# Patient Record
Sex: Female | Born: 1960 | Race: White | Hispanic: No | Marital: Single | Smoking: Current every day smoker
Health system: Southern US, Community
[De-identification: ages and names within clinical notes are randomized; demographics above are authoritative.]

## PROBLEM LIST (undated history)

## (undated) DIAGNOSIS — Z87828 Personal history of other (healed) physical injury and trauma: Secondary | ICD-10-CM

## (undated) DIAGNOSIS — M1990 Unspecified osteoarthritis, unspecified site: Secondary | ICD-10-CM

## (undated) DIAGNOSIS — Z8669 Personal history of other diseases of the nervous system and sense organs: Secondary | ICD-10-CM

## (undated) DIAGNOSIS — I10 Essential (primary) hypertension: Secondary | ICD-10-CM

## (undated) DIAGNOSIS — M549 Dorsalgia, unspecified: Secondary | ICD-10-CM

## (undated) DIAGNOSIS — G8929 Other chronic pain: Secondary | ICD-10-CM

## (undated) DIAGNOSIS — Z87891 Personal history of nicotine dependence: Secondary | ICD-10-CM

## (undated) DIAGNOSIS — R079 Chest pain, unspecified: Secondary | ICD-10-CM

## (undated) DIAGNOSIS — J449 Chronic obstructive pulmonary disease, unspecified: Secondary | ICD-10-CM

## (undated) DIAGNOSIS — R0600 Dyspnea, unspecified: Secondary | ICD-10-CM

## (undated) DIAGNOSIS — K219 Gastro-esophageal reflux disease without esophagitis: Secondary | ICD-10-CM

## (undated) DIAGNOSIS — M810 Age-related osteoporosis without current pathological fracture: Secondary | ICD-10-CM

## (undated) DIAGNOSIS — I493 Ventricular premature depolarization: Secondary | ICD-10-CM

## (undated) DIAGNOSIS — E785 Hyperlipidemia, unspecified: Secondary | ICD-10-CM

## (undated) DIAGNOSIS — F444 Conversion disorder with motor symptom or deficit: Secondary | ICD-10-CM

## (undated) DIAGNOSIS — K921 Melena: Secondary | ICD-10-CM

## (undated) DIAGNOSIS — E559 Vitamin D deficiency, unspecified: Secondary | ICD-10-CM

## (undated) DIAGNOSIS — R630 Anorexia: Secondary | ICD-10-CM

## (undated) DIAGNOSIS — G8918 Other acute postprocedural pain: Secondary | ICD-10-CM

## (undated) HISTORY — DX: Age-related osteoporosis without current pathological fracture: M81.0

## (undated) HISTORY — DX: Gastro-esophageal reflux disease without esophagitis: K21.9

## (undated) HISTORY — DX: Ventricular premature depolarization: I49.3

## (undated) HISTORY — DX: Personal history of other diseases of the nervous system and sense organs: Z86.69

## (undated) HISTORY — DX: Other acute postprocedural pain: G89.18

## (undated) HISTORY — DX: Conversion disorder with motor symptom or deficit: F44.4

## (undated) HISTORY — DX: Chronic obstructive pulmonary disease, unspecified: J44.9

## (undated) HISTORY — DX: Vitamin D deficiency, unspecified: E55.9

## (undated) HISTORY — DX: Essential (primary) hypertension: I10

## (undated) HISTORY — PX: ABDOMINAL HYSTERECTOMY: SHX81

## (undated) HISTORY — DX: Other chronic pain: G89.29

## (undated) HISTORY — DX: Anorexia: R63.0

## (undated) HISTORY — DX: Hyperlipidemia, unspecified: E78.5

## (undated) HISTORY — DX: Personal history of other (healed) physical injury and trauma: Z87.828

## (undated) HISTORY — DX: Dorsalgia, unspecified: M54.9

## (undated) HISTORY — PX: HERNIA REPAIR: SHX51

---

## 1980-05-06 DIAGNOSIS — Z87828 Personal history of other (healed) physical injury and trauma: Secondary | ICD-10-CM

## 1980-05-06 HISTORY — DX: Personal history of other (healed) physical injury and trauma: Z87.828

## 1980-05-06 HISTORY — PX: EXPLORATORY LAPAROTOMY: SUR591

## 1992-05-06 HISTORY — PX: SPINE SURGERY: SHX786

## 1997-08-06 ENCOUNTER — Inpatient Hospital Stay (HOSPITAL_COMMUNITY): Admission: EM | Admit: 1997-08-06 | Discharge: 1997-08-08 | Payer: Self-pay | Admitting: Emergency Medicine

## 1997-08-11 ENCOUNTER — Encounter: Admission: RE | Admit: 1997-08-11 | Discharge: 1997-08-11 | Payer: Self-pay | Admitting: Family Medicine

## 1997-08-16 ENCOUNTER — Encounter: Admission: RE | Admit: 1997-08-16 | Discharge: 1997-08-16 | Payer: Self-pay | Admitting: Family Medicine

## 1997-09-14 ENCOUNTER — Encounter: Admission: RE | Admit: 1997-09-14 | Discharge: 1997-09-14 | Payer: Self-pay | Admitting: Family Medicine

## 1997-11-07 ENCOUNTER — Emergency Department (HOSPITAL_COMMUNITY): Admission: EM | Admit: 1997-11-07 | Discharge: 1997-11-07 | Payer: Self-pay | Admitting: *Deleted

## 1998-02-21 ENCOUNTER — Other Ambulatory Visit: Admission: RE | Admit: 1998-02-21 | Discharge: 1998-02-21 | Payer: Self-pay | Admitting: *Deleted

## 1998-02-21 ENCOUNTER — Encounter: Admission: RE | Admit: 1998-02-21 | Discharge: 1998-02-21 | Payer: Self-pay | Admitting: Sports Medicine

## 1998-03-22 ENCOUNTER — Encounter: Admission: RE | Admit: 1998-03-22 | Discharge: 1998-03-22 | Payer: Self-pay | Admitting: Family Medicine

## 1998-08-01 ENCOUNTER — Encounter: Admission: RE | Admit: 1998-08-01 | Discharge: 1998-08-01 | Payer: Self-pay | Admitting: Family Medicine

## 1999-01-09 ENCOUNTER — Emergency Department (HOSPITAL_COMMUNITY): Admission: EM | Admit: 1999-01-09 | Discharge: 1999-01-09 | Payer: Self-pay | Admitting: Emergency Medicine

## 1999-02-11 ENCOUNTER — Encounter: Payer: Self-pay | Admitting: Family Medicine

## 1999-02-12 ENCOUNTER — Inpatient Hospital Stay (HOSPITAL_COMMUNITY): Admission: EM | Admit: 1999-02-12 | Discharge: 1999-02-13 | Payer: Self-pay | Admitting: *Deleted

## 1999-06-07 ENCOUNTER — Encounter: Payer: Self-pay | Admitting: Neurosurgery

## 1999-06-07 ENCOUNTER — Ambulatory Visit (HOSPITAL_COMMUNITY): Admission: RE | Admit: 1999-06-07 | Discharge: 1999-06-07 | Payer: Self-pay | Admitting: Neurosurgery

## 1999-06-21 ENCOUNTER — Ambulatory Visit (HOSPITAL_COMMUNITY): Admission: RE | Admit: 1999-06-21 | Discharge: 1999-06-21 | Payer: Self-pay | Admitting: Neurosurgery

## 1999-06-21 ENCOUNTER — Encounter: Payer: Self-pay | Admitting: Neurosurgery

## 1999-07-02 ENCOUNTER — Encounter: Admission: RE | Admit: 1999-07-02 | Discharge: 1999-07-02 | Payer: Self-pay | Admitting: Family Medicine

## 1999-07-23 ENCOUNTER — Encounter: Payer: Self-pay | Admitting: Neurosurgery

## 1999-07-23 ENCOUNTER — Ambulatory Visit (HOSPITAL_COMMUNITY): Admission: RE | Admit: 1999-07-23 | Discharge: 1999-07-23 | Payer: Self-pay | Admitting: Neurosurgery

## 1999-07-27 ENCOUNTER — Encounter: Admission: RE | Admit: 1999-07-27 | Discharge: 1999-07-27 | Payer: Self-pay | Admitting: Family Medicine

## 1999-08-28 ENCOUNTER — Encounter: Admission: RE | Admit: 1999-08-28 | Discharge: 1999-08-28 | Payer: Self-pay | Admitting: Sports Medicine

## 1999-08-28 ENCOUNTER — Encounter: Payer: Self-pay | Admitting: Sports Medicine

## 1999-09-04 ENCOUNTER — Encounter (INDEPENDENT_AMBULATORY_CARE_PROVIDER_SITE_OTHER): Payer: Self-pay | Admitting: *Deleted

## 1999-09-04 LAB — CONVERTED CEMR LAB

## 1999-09-05 ENCOUNTER — Encounter: Admission: RE | Admit: 1999-09-05 | Discharge: 1999-09-05 | Payer: Self-pay | Admitting: Family Medicine

## 2000-01-22 ENCOUNTER — Encounter: Admission: RE | Admit: 2000-01-22 | Discharge: 2000-01-22 | Payer: Self-pay | Admitting: Family Medicine

## 2000-02-21 ENCOUNTER — Encounter: Admission: RE | Admit: 2000-02-21 | Discharge: 2000-02-21 | Payer: Self-pay | Admitting: Family Medicine

## 2000-04-19 ENCOUNTER — Encounter: Admission: RE | Admit: 2000-04-19 | Discharge: 2000-04-19 | Payer: Self-pay | Admitting: Orthopedic Surgery

## 2000-04-19 ENCOUNTER — Encounter: Payer: Self-pay | Admitting: Orthopedic Surgery

## 2000-07-03 ENCOUNTER — Emergency Department (HOSPITAL_COMMUNITY): Admission: EM | Admit: 2000-07-03 | Discharge: 2000-07-03 | Payer: Self-pay | Admitting: Emergency Medicine

## 2000-09-01 ENCOUNTER — Encounter: Admission: RE | Admit: 2000-09-01 | Discharge: 2000-09-01 | Payer: Self-pay | Admitting: Family Medicine

## 2000-09-16 ENCOUNTER — Encounter: Admission: RE | Admit: 2000-09-16 | Discharge: 2000-09-16 | Payer: Self-pay | Admitting: Family Medicine

## 2000-09-24 ENCOUNTER — Encounter: Admission: RE | Admit: 2000-09-24 | Discharge: 2000-09-24 | Payer: Self-pay | Admitting: Family Medicine

## 2000-10-01 ENCOUNTER — Encounter: Admission: RE | Admit: 2000-10-01 | Discharge: 2000-10-01 | Payer: Self-pay | Admitting: Family Medicine

## 2000-10-13 ENCOUNTER — Encounter: Admission: RE | Admit: 2000-10-13 | Discharge: 2000-10-13 | Payer: Self-pay | Admitting: Sports Medicine

## 2000-10-29 ENCOUNTER — Encounter: Admission: RE | Admit: 2000-10-29 | Discharge: 2000-10-29 | Payer: Self-pay | Admitting: Family Medicine

## 2000-10-30 ENCOUNTER — Ambulatory Visit (HOSPITAL_COMMUNITY): Admission: RE | Admit: 2000-10-30 | Discharge: 2000-10-30 | Payer: Self-pay | Admitting: *Deleted

## 2000-11-13 ENCOUNTER — Encounter: Admission: RE | Admit: 2000-11-13 | Discharge: 2000-11-13 | Payer: Self-pay | Admitting: Family Medicine

## 2000-11-24 ENCOUNTER — Encounter: Admission: RE | Admit: 2000-11-24 | Discharge: 2000-11-24 | Payer: Self-pay | Admitting: *Deleted

## 2001-04-10 ENCOUNTER — Encounter (INDEPENDENT_AMBULATORY_CARE_PROVIDER_SITE_OTHER): Payer: Self-pay

## 2001-04-10 ENCOUNTER — Ambulatory Visit (HOSPITAL_COMMUNITY): Admission: RE | Admit: 2001-04-10 | Discharge: 2001-04-10 | Payer: Self-pay | Admitting: *Deleted

## 2001-06-18 ENCOUNTER — Encounter: Admission: RE | Admit: 2001-06-18 | Discharge: 2001-06-18 | Payer: Self-pay | Admitting: Family Medicine

## 2001-08-31 ENCOUNTER — Encounter: Admission: RE | Admit: 2001-08-31 | Discharge: 2001-08-31 | Payer: Self-pay | Admitting: Family Medicine

## 2001-09-07 ENCOUNTER — Inpatient Hospital Stay (HOSPITAL_COMMUNITY): Admission: RE | Admit: 2001-09-07 | Discharge: 2001-09-09 | Payer: Self-pay | Admitting: *Deleted

## 2001-09-10 ENCOUNTER — Encounter: Payer: Self-pay | Admitting: Emergency Medicine

## 2001-09-10 ENCOUNTER — Emergency Department (HOSPITAL_COMMUNITY): Admission: EM | Admit: 2001-09-10 | Discharge: 2001-09-11 | Payer: Self-pay | Admitting: Emergency Medicine

## 2002-04-12 ENCOUNTER — Encounter: Admission: RE | Admit: 2002-04-12 | Discharge: 2002-04-12 | Payer: Self-pay | Admitting: Family Medicine

## 2002-08-27 ENCOUNTER — Encounter: Admission: RE | Admit: 2002-08-27 | Discharge: 2002-08-27 | Payer: Self-pay | Admitting: Sports Medicine

## 2002-08-27 ENCOUNTER — Encounter: Payer: Self-pay | Admitting: Sports Medicine

## 2002-10-14 ENCOUNTER — Other Ambulatory Visit: Admission: RE | Admit: 2002-10-14 | Discharge: 2002-10-14 | Payer: Self-pay | Admitting: *Deleted

## 2003-09-14 ENCOUNTER — Encounter: Admission: RE | Admit: 2003-09-14 | Discharge: 2003-09-14 | Payer: Self-pay | Admitting: Orthopedic Surgery

## 2004-01-04 ENCOUNTER — Encounter: Admission: RE | Admit: 2004-01-04 | Discharge: 2004-01-04 | Payer: Self-pay | Admitting: Family Medicine

## 2004-05-23 ENCOUNTER — Ambulatory Visit: Payer: Self-pay | Admitting: Family Medicine

## 2005-03-07 ENCOUNTER — Ambulatory Visit: Payer: Self-pay | Admitting: Family Medicine

## 2005-08-27 ENCOUNTER — Encounter: Admission: RE | Admit: 2005-08-27 | Discharge: 2005-08-27 | Payer: Self-pay | Admitting: Unknown Physician Specialty

## 2006-07-03 DIAGNOSIS — F17200 Nicotine dependence, unspecified, uncomplicated: Secondary | ICD-10-CM | POA: Insufficient documentation

## 2006-07-03 DIAGNOSIS — K219 Gastro-esophageal reflux disease without esophagitis: Secondary | ICD-10-CM | POA: Insufficient documentation

## 2006-07-04 ENCOUNTER — Encounter (INDEPENDENT_AMBULATORY_CARE_PROVIDER_SITE_OTHER): Payer: Self-pay | Admitting: *Deleted

## 2006-10-22 ENCOUNTER — Telehealth: Payer: Self-pay | Admitting: *Deleted

## 2006-11-17 ENCOUNTER — Emergency Department (HOSPITAL_COMMUNITY): Admission: EM | Admit: 2006-11-17 | Discharge: 2006-11-17 | Payer: Self-pay | Admitting: Emergency Medicine

## 2007-02-13 ENCOUNTER — Encounter
Admission: RE | Admit: 2007-02-13 | Discharge: 2007-03-19 | Payer: Self-pay | Admitting: Physical Medicine & Rehabilitation

## 2007-02-16 ENCOUNTER — Ambulatory Visit: Payer: Self-pay | Admitting: Physical Medicine & Rehabilitation

## 2007-05-29 ENCOUNTER — Encounter: Admission: RE | Admit: 2007-05-29 | Discharge: 2007-05-29 | Payer: Self-pay | Admitting: Orthopedic Surgery

## 2007-06-03 ENCOUNTER — Ambulatory Visit: Payer: Self-pay | Admitting: Physical Medicine & Rehabilitation

## 2007-06-03 ENCOUNTER — Encounter
Admission: RE | Admit: 2007-06-03 | Discharge: 2007-09-01 | Payer: Self-pay | Admitting: Physical Medicine & Rehabilitation

## 2007-06-03 ENCOUNTER — Encounter (INDEPENDENT_AMBULATORY_CARE_PROVIDER_SITE_OTHER): Payer: Self-pay | Admitting: Family Medicine

## 2007-07-17 ENCOUNTER — Ambulatory Visit: Payer: Self-pay | Admitting: Physical Medicine & Rehabilitation

## 2007-07-20 ENCOUNTER — Emergency Department (HOSPITAL_COMMUNITY): Admission: EM | Admit: 2007-07-20 | Discharge: 2007-07-20 | Payer: Self-pay | Admitting: Emergency Medicine

## 2007-09-24 ENCOUNTER — Encounter
Admission: RE | Admit: 2007-09-24 | Discharge: 2007-12-23 | Payer: Self-pay | Admitting: Physical Medicine & Rehabilitation

## 2007-09-29 ENCOUNTER — Ambulatory Visit: Payer: Self-pay | Admitting: Physical Medicine & Rehabilitation

## 2007-11-03 ENCOUNTER — Ambulatory Visit: Payer: Self-pay | Admitting: Physical Medicine & Rehabilitation

## 2007-11-04 ENCOUNTER — Encounter: Admission: RE | Admit: 2007-11-04 | Discharge: 2007-11-04 | Payer: Self-pay | Admitting: Orthopedic Surgery

## 2007-12-15 ENCOUNTER — Ambulatory Visit: Payer: Self-pay | Admitting: Physical Medicine & Rehabilitation

## 2008-01-04 ENCOUNTER — Encounter
Admission: RE | Admit: 2008-01-04 | Discharge: 2008-04-03 | Payer: Self-pay | Admitting: Physical Medicine & Rehabilitation

## 2008-01-05 ENCOUNTER — Ambulatory Visit: Payer: Self-pay | Admitting: Physical Medicine & Rehabilitation

## 2008-02-02 ENCOUNTER — Ambulatory Visit: Payer: Self-pay | Admitting: Physical Medicine & Rehabilitation

## 2008-03-15 ENCOUNTER — Ambulatory Visit: Payer: Self-pay | Admitting: Physical Medicine & Rehabilitation

## 2008-05-09 ENCOUNTER — Encounter
Admission: RE | Admit: 2008-05-09 | Discharge: 2008-08-07 | Payer: Self-pay | Admitting: Physical Medicine & Rehabilitation

## 2008-05-12 ENCOUNTER — Emergency Department: Payer: Self-pay | Admitting: Emergency Medicine

## 2008-05-17 ENCOUNTER — Ambulatory Visit: Payer: Self-pay | Admitting: Physical Medicine & Rehabilitation

## 2008-06-07 ENCOUNTER — Ambulatory Visit: Payer: Self-pay | Admitting: Physical Medicine & Rehabilitation

## 2008-07-12 ENCOUNTER — Ambulatory Visit: Payer: Self-pay | Admitting: Family Medicine

## 2008-07-12 ENCOUNTER — Encounter: Admission: RE | Admit: 2008-07-12 | Discharge: 2008-07-12 | Payer: Self-pay | Admitting: Family Medicine

## 2008-07-12 ENCOUNTER — Encounter (INDEPENDENT_AMBULATORY_CARE_PROVIDER_SITE_OTHER): Payer: Self-pay | Admitting: Family Medicine

## 2008-07-12 LAB — CONVERTED CEMR LAB
ALT: 15 units/L (ref 0–35)
CO2: 23 meq/L (ref 19–32)
Chloride: 106 meq/L (ref 96–112)
Cholesterol: 211 mg/dL — ABNORMAL HIGH (ref 0–200)
Folate: 4.5 ng/mL
MCV: 96.4 fL (ref 78.0–100.0)
Platelets: 246 10*3/uL (ref 150–400)
Sodium: 141 meq/L (ref 135–145)
Total Bilirubin: 0.2 mg/dL — ABNORMAL LOW (ref 0.3–1.2)
Total Protein: 6.6 g/dL (ref 6.0–8.3)
VLDL: 32 mg/dL (ref 0–40)
Vitamin B-12: 624 pg/mL (ref 211–911)
WBC: 10 10*3/uL (ref 4.0–10.5)

## 2008-07-14 ENCOUNTER — Encounter (INDEPENDENT_AMBULATORY_CARE_PROVIDER_SITE_OTHER): Payer: Self-pay | Admitting: Family Medicine

## 2008-07-15 ENCOUNTER — Telehealth: Payer: Self-pay | Admitting: *Deleted

## 2008-07-20 ENCOUNTER — Encounter: Admission: RE | Admit: 2008-07-20 | Discharge: 2008-07-20 | Payer: Self-pay | Admitting: Family Medicine

## 2008-07-28 ENCOUNTER — Telehealth: Payer: Self-pay | Admitting: *Deleted

## 2008-08-17 ENCOUNTER — Encounter (INDEPENDENT_AMBULATORY_CARE_PROVIDER_SITE_OTHER): Payer: Self-pay | Admitting: Family Medicine

## 2008-08-23 ENCOUNTER — Encounter
Admission: RE | Admit: 2008-08-23 | Discharge: 2008-11-13 | Payer: Self-pay | Admitting: Physical Medicine & Rehabilitation

## 2008-08-23 ENCOUNTER — Ambulatory Visit: Payer: Self-pay | Admitting: Physical Medicine & Rehabilitation

## 2008-08-30 ENCOUNTER — Encounter (INDEPENDENT_AMBULATORY_CARE_PROVIDER_SITE_OTHER): Payer: Self-pay | Admitting: Family Medicine

## 2008-08-30 ENCOUNTER — Ambulatory Visit: Payer: Self-pay | Admitting: Family Medicine

## 2008-08-31 ENCOUNTER — Encounter (INDEPENDENT_AMBULATORY_CARE_PROVIDER_SITE_OTHER): Payer: Self-pay | Admitting: *Deleted

## 2008-09-20 ENCOUNTER — Telehealth (INDEPENDENT_AMBULATORY_CARE_PROVIDER_SITE_OTHER): Payer: Self-pay | Admitting: *Deleted

## 2008-09-29 ENCOUNTER — Telehealth (INDEPENDENT_AMBULATORY_CARE_PROVIDER_SITE_OTHER): Payer: Self-pay | Admitting: *Deleted

## 2008-09-29 ENCOUNTER — Telehealth (INDEPENDENT_AMBULATORY_CARE_PROVIDER_SITE_OTHER): Payer: Self-pay | Admitting: Family Medicine

## 2008-09-30 ENCOUNTER — Telehealth (INDEPENDENT_AMBULATORY_CARE_PROVIDER_SITE_OTHER): Payer: Self-pay | Admitting: *Deleted

## 2008-10-04 ENCOUNTER — Telehealth (INDEPENDENT_AMBULATORY_CARE_PROVIDER_SITE_OTHER): Payer: Self-pay | Admitting: *Deleted

## 2008-10-07 ENCOUNTER — Ambulatory Visit: Payer: Self-pay | Admitting: Family Medicine

## 2008-10-11 ENCOUNTER — Encounter (INDEPENDENT_AMBULATORY_CARE_PROVIDER_SITE_OTHER): Payer: Self-pay | Admitting: Family Medicine

## 2008-10-20 ENCOUNTER — Ambulatory Visit: Payer: Self-pay | Admitting: Family Medicine

## 2008-10-20 ENCOUNTER — Encounter: Payer: Self-pay | Admitting: Sports Medicine

## 2008-10-21 ENCOUNTER — Telehealth (INDEPENDENT_AMBULATORY_CARE_PROVIDER_SITE_OTHER): Payer: Self-pay | Admitting: Family Medicine

## 2008-10-22 ENCOUNTER — Telehealth (INDEPENDENT_AMBULATORY_CARE_PROVIDER_SITE_OTHER): Payer: Self-pay | Admitting: *Deleted

## 2008-10-24 ENCOUNTER — Telehealth: Payer: Self-pay | Admitting: Sports Medicine

## 2008-10-26 ENCOUNTER — Telehealth: Payer: Self-pay | Admitting: Sports Medicine

## 2008-10-27 ENCOUNTER — Ambulatory Visit: Payer: Self-pay | Admitting: Family Medicine

## 2008-10-31 ENCOUNTER — Telehealth: Payer: Self-pay | Admitting: Sports Medicine

## 2008-11-02 ENCOUNTER — Inpatient Hospital Stay (HOSPITAL_COMMUNITY): Admission: EM | Admit: 2008-11-02 | Discharge: 2008-11-14 | Payer: Self-pay | Admitting: Emergency Medicine

## 2008-11-02 ENCOUNTER — Ambulatory Visit: Payer: Self-pay | Admitting: Cardiology

## 2008-11-02 ENCOUNTER — Encounter: Payer: Self-pay | Admitting: Family Medicine

## 2008-11-02 ENCOUNTER — Ambulatory Visit: Payer: Self-pay | Admitting: Family Medicine

## 2008-11-02 ENCOUNTER — Ambulatory Visit: Payer: Self-pay | Admitting: Pulmonary Disease

## 2008-11-03 ENCOUNTER — Encounter: Payer: Self-pay | Admitting: Sports Medicine

## 2008-11-03 ENCOUNTER — Encounter: Payer: Self-pay | Admitting: Family Medicine

## 2008-11-04 ENCOUNTER — Ambulatory Visit: Payer: Self-pay | Admitting: Infectious Diseases

## 2008-11-10 ENCOUNTER — Encounter (INDEPENDENT_AMBULATORY_CARE_PROVIDER_SITE_OTHER): Payer: Self-pay | Admitting: Internal Medicine

## 2008-11-15 ENCOUNTER — Encounter: Payer: Self-pay | Admitting: Family Medicine

## 2008-11-16 ENCOUNTER — Ambulatory Visit: Payer: Self-pay | Admitting: Family Medicine

## 2008-11-16 ENCOUNTER — Encounter: Payer: Self-pay | Admitting: Sports Medicine

## 2008-11-16 LAB — CONVERTED CEMR LAB
ALT: 48 units/L — ABNORMAL HIGH (ref 0–35)
AST: 30 units/L (ref 0–37)
Albumin: 4.1 g/dL (ref 3.5–5.2)
Alkaline Phosphatase: 66 units/L (ref 39–117)
BUN: 19 mg/dL (ref 6–23)
CO2: 21 meq/L (ref 19–32)
Calcium: 9.1 mg/dL (ref 8.4–10.5)
Chloride: 102 meq/L (ref 96–112)
Creatinine, Ser: 1.33 mg/dL — ABNORMAL HIGH (ref 0.40–1.20)
Glucose, Bld: 73 mg/dL (ref 70–99)
Hemoglobin: 10.6 g/dL
Potassium: 4.6 meq/L (ref 3.5–5.3)
Sodium: 138 meq/L (ref 135–145)
Total Bilirubin: 0.5 mg/dL (ref 0.3–1.2)
Total Protein: 6.5 g/dL (ref 6.0–8.3)

## 2008-11-18 ENCOUNTER — Telehealth: Payer: Self-pay | Admitting: Sports Medicine

## 2008-11-21 ENCOUNTER — Telehealth: Payer: Self-pay | Admitting: Sports Medicine

## 2008-11-23 ENCOUNTER — Telehealth: Payer: Self-pay | Admitting: Sports Medicine

## 2008-11-29 ENCOUNTER — Telehealth: Payer: Self-pay | Admitting: Sports Medicine

## 2008-12-06 ENCOUNTER — Ambulatory Visit: Payer: Self-pay | Admitting: Family Medicine

## 2008-12-06 ENCOUNTER — Telehealth: Payer: Self-pay | Admitting: Sports Medicine

## 2008-12-06 ENCOUNTER — Encounter: Payer: Self-pay | Admitting: Sports Medicine

## 2008-12-06 ENCOUNTER — Ambulatory Visit (HOSPITAL_COMMUNITY): Admission: RE | Admit: 2008-12-06 | Discharge: 2008-12-06 | Payer: Self-pay | Admitting: Family Medicine

## 2008-12-06 ENCOUNTER — Encounter: Admission: RE | Admit: 2008-12-06 | Discharge: 2008-12-06 | Payer: Self-pay | Admitting: Sports Medicine

## 2008-12-06 LAB — CONVERTED CEMR LAB
BUN: 7 mg/dL (ref 6–23)
Basophils Absolute: 0 10*3/uL (ref 0.0–0.1)
Basophils Relative: 0 % (ref 0–1)
CO2: 26 meq/L (ref 19–32)
Calcium: 9.7 mg/dL (ref 8.4–10.5)
Chloride: 105 meq/L (ref 96–112)
Creatinine, Ser: 0.84 mg/dL (ref 0.40–1.20)
Eosinophils Absolute: 0.3 10*3/uL (ref 0.0–0.7)
Eosinophils Relative: 3 % (ref 0–5)
Glucose, Bld: 82 mg/dL (ref 70–99)
HCT: 38.9 % (ref 36.0–46.0)
Hemoglobin: 11.9 g/dL — ABNORMAL LOW (ref 12.0–15.0)
Lymphocytes Relative: 28 % (ref 12–46)
Lymphs Abs: 2.4 K/uL (ref 0.7–4.0)
MCHC: 30.6 g/dL (ref 30.0–36.0)
MCV: 98.2 fL (ref 78.0–100.0)
Monocytes Absolute: 0.8 10*3/uL (ref 0.1–1.0)
Monocytes Relative: 9 % (ref 3–12)
Neutro Abs: 5.2 K/uL (ref 1.7–7.7)
Neutrophils Relative %: 60 % (ref 43–77)
Platelets: 537 10*3/uL — ABNORMAL HIGH (ref 150–400)
Potassium: 5.4 meq/L — ABNORMAL HIGH (ref 3.5–5.3)
Pro B Natriuretic peptide (BNP): 17.3 pg/mL (ref 0.0–100.0)
RBC: 3.96 M/uL (ref 3.87–5.11)
RDW: 15 % (ref 11.5–15.5)
Sodium: 141 meq/L (ref 135–145)
WBC: 8.6 10*3/microliter (ref 4.0–10.5)

## 2008-12-07 ENCOUNTER — Encounter: Payer: Self-pay | Admitting: Sports Medicine

## 2008-12-07 ENCOUNTER — Ambulatory Visit: Payer: Self-pay | Admitting: Family Medicine

## 2008-12-07 ENCOUNTER — Telehealth: Payer: Self-pay | Admitting: *Deleted

## 2008-12-07 LAB — CONVERTED CEMR LAB
BUN: 8 mg/dL (ref 6–23)
CO2: 24 meq/L (ref 19–32)
Calcium: 9.5 mg/dL (ref 8.4–10.5)
Chloride: 104 meq/L (ref 96–112)
Creatinine, Ser: 0.91 mg/dL (ref 0.40–1.20)
Folate: 6.8 ng/mL
Glucose, Bld: 77 mg/dL (ref 70–99)
Potassium: 4.7 meq/L (ref 3.5–5.3)
Sodium: 141 meq/L (ref 135–145)
TSH: 0.972 microintl units/mL (ref 0.350–4.500)
Vitamin B-12: 573 pg/mL (ref 211–911)

## 2008-12-08 ENCOUNTER — Telehealth: Payer: Self-pay | Admitting: Sports Medicine

## 2008-12-08 ENCOUNTER — Encounter: Payer: Self-pay | Admitting: Sports Medicine

## 2008-12-09 ENCOUNTER — Encounter: Payer: Self-pay | Admitting: Sports Medicine

## 2008-12-22 ENCOUNTER — Telehealth: Payer: Self-pay | Admitting: Sports Medicine

## 2008-12-29 ENCOUNTER — Ambulatory Visit: Payer: Self-pay | Admitting: Family Medicine

## 2008-12-30 ENCOUNTER — Ambulatory Visit: Payer: Self-pay | Admitting: Family Medicine

## 2009-01-16 ENCOUNTER — Telehealth: Payer: Self-pay | Admitting: Sports Medicine

## 2009-01-17 ENCOUNTER — Telehealth: Payer: Self-pay | Admitting: *Deleted

## 2009-01-31 ENCOUNTER — Telehealth: Payer: Self-pay | Admitting: Sports Medicine

## 2009-01-31 ENCOUNTER — Ambulatory Visit: Payer: Self-pay | Admitting: Family Medicine

## 2009-03-01 ENCOUNTER — Telehealth: Payer: Self-pay | Admitting: Sports Medicine

## 2009-03-01 ENCOUNTER — Encounter: Admission: RE | Admit: 2009-03-01 | Discharge: 2009-03-01 | Payer: Self-pay | Admitting: Sports Medicine

## 2009-03-01 ENCOUNTER — Ambulatory Visit: Payer: Self-pay | Admitting: Family Medicine

## 2009-03-01 ENCOUNTER — Encounter: Payer: Self-pay | Admitting: Sports Medicine

## 2009-03-02 ENCOUNTER — Encounter: Payer: Self-pay | Admitting: Sports Medicine

## 2009-03-09 ENCOUNTER — Telehealth: Payer: Self-pay | Admitting: Sports Medicine

## 2009-03-14 ENCOUNTER — Telehealth: Payer: Self-pay | Admitting: Sports Medicine

## 2009-03-15 ENCOUNTER — Ambulatory Visit: Payer: Self-pay | Admitting: Family Medicine

## 2009-03-21 ENCOUNTER — Telehealth (INDEPENDENT_AMBULATORY_CARE_PROVIDER_SITE_OTHER): Payer: Self-pay | Admitting: Family Medicine

## 2009-03-22 ENCOUNTER — Telehealth: Payer: Self-pay | Admitting: Sports Medicine

## 2009-03-22 ENCOUNTER — Encounter: Payer: Self-pay | Admitting: Sports Medicine

## 2009-03-28 ENCOUNTER — Encounter: Payer: Self-pay | Admitting: Sports Medicine

## 2009-04-12 ENCOUNTER — Ambulatory Visit: Payer: Self-pay | Admitting: Family Medicine

## 2009-04-17 ENCOUNTER — Telehealth: Payer: Self-pay | Admitting: Sports Medicine

## 2009-04-18 ENCOUNTER — Ambulatory Visit: Payer: Self-pay | Admitting: Family Medicine

## 2009-04-25 ENCOUNTER — Encounter: Payer: Self-pay | Admitting: Sports Medicine

## 2009-04-26 ENCOUNTER — Telehealth: Payer: Self-pay | Admitting: Sports Medicine

## 2009-05-03 ENCOUNTER — Encounter: Payer: Self-pay | Admitting: Sports Medicine

## 2009-05-03 ENCOUNTER — Ambulatory Visit: Payer: Self-pay | Admitting: Family Medicine

## 2009-05-03 DIAGNOSIS — J310 Chronic rhinitis: Secondary | ICD-10-CM

## 2009-05-03 LAB — CONVERTED CEMR LAB
BUN: 13 mg/dL (ref 6–23)
CO2: 19 meq/L (ref 19–32)
Calcium: 9.7 mg/dL (ref 8.4–10.5)
Chloride: 111 meq/L (ref 96–112)
Creatinine, Ser: 1.18 mg/dL (ref 0.40–1.20)
Glucose, Bld: 77 mg/dL (ref 70–99)
Potassium: 3.7 meq/L (ref 3.5–5.3)
Pro B Natriuretic peptide (BNP): 5.6 pg/mL (ref 0.0–100.0)
Sodium: 144 meq/L (ref 135–145)

## 2009-05-09 ENCOUNTER — Ambulatory Visit: Payer: Self-pay | Admitting: Family Medicine

## 2009-05-10 ENCOUNTER — Ambulatory Visit: Payer: Self-pay | Admitting: Family Medicine

## 2009-05-12 ENCOUNTER — Telehealth: Payer: Self-pay | Admitting: Sports Medicine

## 2009-05-15 ENCOUNTER — Telehealth: Payer: Self-pay | Admitting: Sports Medicine

## 2009-05-16 ENCOUNTER — Telehealth: Payer: Self-pay | Admitting: Sports Medicine

## 2009-05-23 ENCOUNTER — Ambulatory Visit: Payer: Self-pay | Admitting: Family Medicine

## 2009-05-23 ENCOUNTER — Encounter: Payer: Self-pay | Admitting: Family Medicine

## 2009-05-23 DIAGNOSIS — G4700 Insomnia, unspecified: Secondary | ICD-10-CM | POA: Insufficient documentation

## 2009-05-23 DIAGNOSIS — R630 Anorexia: Secondary | ICD-10-CM

## 2009-05-23 LAB — CONVERTED CEMR LAB: H Pylori IgG: NEGATIVE

## 2009-05-24 ENCOUNTER — Telehealth: Payer: Self-pay | Admitting: Family Medicine

## 2009-05-25 ENCOUNTER — Encounter: Payer: Self-pay | Admitting: Family Medicine

## 2009-06-07 ENCOUNTER — Ambulatory Visit: Payer: Self-pay | Admitting: Family Medicine

## 2009-06-07 DIAGNOSIS — I493 Ventricular premature depolarization: Secondary | ICD-10-CM | POA: Insufficient documentation

## 2009-06-15 ENCOUNTER — Telehealth: Payer: Self-pay | Admitting: Sports Medicine

## 2009-06-21 ENCOUNTER — Encounter: Payer: Self-pay | Admitting: Sports Medicine

## 2009-06-21 ENCOUNTER — Ambulatory Visit: Payer: Self-pay | Admitting: Family Medicine

## 2009-06-21 LAB — CONVERTED CEMR LAB
BUN: 8 mg/dL (ref 6–23)
CO2: 26 meq/L (ref 19–32)
Calcium: 9.3 mg/dL (ref 8.4–10.5)
Chloride: 104 meq/L (ref 96–112)
Creatinine, Ser: 0.91 mg/dL (ref 0.40–1.20)
Glucose, Bld: 87 mg/dL (ref 70–99)
H Pylori IgG: NEGATIVE
HCT: 39.3 % (ref 36.0–46.0)
Hemoglobin: 12.8 g/dL (ref 12.0–15.0)
MCHC: 32.6 g/dL (ref 30.0–36.0)
MCV: 94.5 fL (ref 78.0–100.0)
Platelets: 231 K/uL (ref 150–400)
Potassium: 3.6 meq/L (ref 3.5–5.3)
RBC: 4.16 M/uL (ref 3.87–5.11)
RDW: 14 % (ref 11.5–15.5)
Sodium: 141 meq/L (ref 135–145)
WBC: 6.2 10*3/microliter (ref 4.0–10.5)

## 2009-06-30 ENCOUNTER — Telehealth (INDEPENDENT_AMBULATORY_CARE_PROVIDER_SITE_OTHER): Payer: Self-pay | Admitting: *Deleted

## 2009-06-30 ENCOUNTER — Telehealth: Payer: Self-pay | Admitting: *Deleted

## 2009-07-04 ENCOUNTER — Encounter: Admission: RE | Admit: 2009-07-04 | Discharge: 2009-09-25 | Payer: Self-pay | Admitting: Sports Medicine

## 2009-07-07 ENCOUNTER — Telehealth: Payer: Self-pay | Admitting: Sports Medicine

## 2009-07-12 HISTORY — PX: ESOPHAGOGASTRODUODENOSCOPY: SHX1529

## 2009-07-26 ENCOUNTER — Encounter: Payer: Self-pay | Admitting: Sports Medicine

## 2009-07-27 ENCOUNTER — Encounter: Admission: RE | Admit: 2009-07-27 | Discharge: 2009-07-27 | Payer: Self-pay | Admitting: Gastroenterology

## 2009-07-27 ENCOUNTER — Telehealth: Payer: Self-pay | Admitting: Sports Medicine

## 2009-08-09 ENCOUNTER — Ambulatory Visit: Payer: Self-pay | Admitting: Family Medicine

## 2009-08-28 ENCOUNTER — Telehealth: Payer: Self-pay | Admitting: *Deleted

## 2009-09-25 ENCOUNTER — Encounter: Payer: Self-pay | Admitting: Sports Medicine

## 2009-09-27 ENCOUNTER — Telehealth: Payer: Self-pay | Admitting: Sports Medicine

## 2009-09-28 ENCOUNTER — Telehealth: Payer: Self-pay | Admitting: Sports Medicine

## 2009-09-29 ENCOUNTER — Telehealth: Payer: Self-pay | Admitting: Sports Medicine

## 2009-09-29 ENCOUNTER — Ambulatory Visit (HOSPITAL_COMMUNITY): Admission: RE | Admit: 2009-09-29 | Discharge: 2009-09-29 | Payer: Self-pay | Admitting: Family Medicine

## 2009-09-29 ENCOUNTER — Ambulatory Visit: Payer: Self-pay | Admitting: Family Medicine

## 2009-10-04 ENCOUNTER — Encounter (INDEPENDENT_AMBULATORY_CARE_PROVIDER_SITE_OTHER): Payer: Self-pay | Admitting: *Deleted

## 2009-10-09 ENCOUNTER — Encounter: Payer: Self-pay | Admitting: Sports Medicine

## 2009-10-26 ENCOUNTER — Telehealth (INDEPENDENT_AMBULATORY_CARE_PROVIDER_SITE_OTHER): Payer: Self-pay | Admitting: *Deleted

## 2009-10-27 ENCOUNTER — Encounter: Payer: Self-pay | Admitting: Sports Medicine

## 2009-10-27 ENCOUNTER — Ambulatory Visit: Payer: Self-pay | Admitting: Family Medicine

## 2009-10-27 ENCOUNTER — Ambulatory Visit (HOSPITAL_COMMUNITY): Admission: RE | Admit: 2009-10-27 | Discharge: 2009-10-27 | Payer: Self-pay | Admitting: Family Medicine

## 2009-10-30 ENCOUNTER — Telehealth: Payer: Self-pay | Admitting: Sports Medicine

## 2010-01-23 ENCOUNTER — Telehealth: Payer: Self-pay | Admitting: Sports Medicine

## 2010-02-06 ENCOUNTER — Encounter: Payer: Self-pay | Admitting: Sports Medicine

## 2010-02-06 ENCOUNTER — Ambulatory Visit: Payer: Self-pay | Admitting: Family Medicine

## 2010-02-14 ENCOUNTER — Telehealth: Payer: Self-pay | Admitting: Sports Medicine

## 2010-02-16 ENCOUNTER — Encounter: Payer: Self-pay | Admitting: Sports Medicine

## 2010-02-16 ENCOUNTER — Ambulatory Visit: Payer: Self-pay | Admitting: Family Medicine

## 2010-02-16 LAB — CONVERTED CEMR LAB
ALT: 30 units/L (ref 0–35)
AST: 20 units/L (ref 0–37)
Albumin: 4.7 g/dL (ref 3.5–5.2)
Alkaline Phosphatase: 84 units/L (ref 39–117)
BUN: 11 mg/dL (ref 6–23)
CO2: 23 meq/L (ref 19–32)
Calcium: 9.6 mg/dL (ref 8.4–10.5)
Chloride: 107 meq/L (ref 96–112)
Creatinine, Ser: 0.93 mg/dL (ref 0.40–1.20)
Glucose, Bld: 78 mg/dL (ref 70–99)
Potassium: 3.9 meq/L (ref 3.5–5.3)
Prealbumin: 21.4 mg/dL (ref 18.0–45.0)
Sodium: 143 meq/L (ref 135–145)
Total Bilirubin: 0.3 mg/dL (ref 0.3–1.2)
Total Protein: 6.9 g/dL (ref 6.0–8.3)
Vit D, 25-Hydroxy: 33 ng/mL (ref 30–89)

## 2010-02-19 ENCOUNTER — Ambulatory Visit: Payer: Self-pay | Admitting: Family Medicine

## 2010-02-20 ENCOUNTER — Encounter: Admission: RE | Admit: 2010-02-20 | Discharge: 2010-02-20 | Payer: Self-pay | Admitting: Sports Medicine

## 2010-02-20 ENCOUNTER — Telehealth: Payer: Self-pay | Admitting: Sports Medicine

## 2010-02-20 ENCOUNTER — Ambulatory Visit: Payer: Self-pay | Admitting: Family Medicine

## 2010-02-23 ENCOUNTER — Encounter: Payer: Self-pay | Admitting: *Deleted

## 2010-03-08 ENCOUNTER — Ambulatory Visit: Payer: Self-pay | Admitting: Family Medicine

## 2010-03-12 ENCOUNTER — Encounter: Payer: Self-pay | Admitting: Sports Medicine

## 2010-03-13 ENCOUNTER — Ambulatory Visit (HOSPITAL_COMMUNITY): Admission: RE | Admit: 2010-03-13 | Discharge: 2010-03-13 | Payer: Self-pay | Admitting: Sports Medicine

## 2010-03-14 ENCOUNTER — Telehealth: Payer: Self-pay | Admitting: *Deleted

## 2010-03-20 ENCOUNTER — Ambulatory Visit: Payer: Self-pay | Admitting: Family Medicine

## 2010-04-10 ENCOUNTER — Encounter: Payer: Self-pay | Admitting: Sports Medicine

## 2010-04-10 ENCOUNTER — Ambulatory Visit: Payer: Self-pay | Admitting: Family Medicine

## 2010-04-11 ENCOUNTER — Ambulatory Visit (HOSPITAL_COMMUNITY)
Admission: RE | Admit: 2010-04-11 | Discharge: 2010-04-11 | Payer: Self-pay | Source: Home / Self Care | Admitting: Sports Medicine

## 2010-04-11 ENCOUNTER — Telehealth: Payer: Self-pay | Admitting: Sports Medicine

## 2010-04-17 ENCOUNTER — Telehealth (INDEPENDENT_AMBULATORY_CARE_PROVIDER_SITE_OTHER): Payer: Self-pay | Admitting: Family Medicine

## 2010-04-17 ENCOUNTER — Encounter: Payer: Self-pay | Admitting: Sports Medicine

## 2010-05-03 ENCOUNTER — Telehealth: Payer: Self-pay | Admitting: *Deleted

## 2010-05-08 ENCOUNTER — Telehealth: Payer: Self-pay | Admitting: *Deleted

## 2010-06-05 ENCOUNTER — Telehealth: Payer: Self-pay | Admitting: Sports Medicine

## 2010-06-05 NOTE — Miscellaneous (Signed)
 Summary: Consent for Punch Biopsy  Consent for Punch Biopsy   Imported By: Madelin Daring 10/21/2008 11:27:00  _____________________________________________________________________  External Attachment:    Type:   Image     Comment:   External Document
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
## 2010-06-05 NOTE — Assessment & Plan Note (Signed)
Summary: atypical chest pain   Vital Signs:  Patient profile:   50 year old female Weight:      105.9 pounds Temp:     98.2 degrees F oral Pulse rate:   76 / minute Pulse rhythm:   regular BP sitting:   118 / 66  (right arm) Cuff size:   regular  Vitals Entered By: Loralee Pacas CMA (Sep 29, 2009 8:51 AM) CC: chest heaviness Is Patient Diabetic? No   Primary Care Provider:  Rodney Langton MD  CC:  chest heaviness.  History of Present Illness: 50yo F w/ chest discomfort  Chest discomfort: Localized to underneath the left breast x 1 week.  Described as "pressure" lasting 1 minute, nonexertional and nonradiating, self-resolving.  No N/V or diaphoresis or SOB.  No hx of HTN, HLD, or DM.  No family hx of CAD/MI in 1st degree relative.    Habits & Providers  Alcohol-Tobacco-Diet     Tobacco Status: quit  Current Medications (verified): 1)  Lortab 10 10-500 Mg Tabs (Hydrocodone-Acetaminophen) .Marland Kitchen.. 1 Tab By Mouth Three Times A Day 2)  Carisoprodol 350 Mg Tabs (Carisoprodol) .... One Tab Po Three Times A Day As Needed For Back Pain. 3)  Trazodone Hcl 100 Mg Tabs (Trazodone Hcl) .... One Tab By Mouth Qhs As Needed Insomnia 4)  Metoprolol Tartrate 25 Mg Tabs (Metoprolol Tartrate) .... One Half Tab By Mouth Bid 5)  Ambien 10 Mg Tabs (Zolpidem Tartrate) .... One Tab By Mouth Qhs As Needed For Insomnia 6)  Mobic 7.5 Mg Tabs (Meloxicam) .Marland Kitchen.. 1 Tab By Mouth Daily With Food 7)  Nitrostat 0.4 Mg Subl (Nitroglycerin) .... Place Underneath The Tongue Every 5 Minutes As Needed For Chest Pain and Call Your Primary Provider  Allergies (verified): 1)  ! Nsaids 2)  ! Neurontin 3)  ! Ultram  Review of Systems      See HPI  Physical Exam  General:  VS Reviewed. Well appearing, NAD.  Head:  atraumatic.   Neck:  supple, full ROM, no goiter or mass  Lungs:  Normal respiratory effort, chest expands symmetrically. Lungs are clear to auscultation, no crackles or wheezes. Heart:  Normal  rate and regular rhythm. S1 and S2 normal without gallop, murmur, click, rub or other extra sounds. Msk:  chest palpated and unable to reproduce her symptoms Extremities:  no edema Neurologic:  no focal deficits Skin:  nl color and turgor Additional Exam:  12 lead EKG: NSR, rate 70, nl axis, nl intervals, no ST or T wave depression or elevation   Impression & Recommendations:  Problem # 1:  CHEST PAIN, ATYPICAL (ICD-786.59) Assessment New  Low suspicion for cardiac etiology.  No risk factors except for hx of tobacco use but quit 1 year ago.  Would consider her as low-intermediate risk for CAD b/c of tobacco hx.  41yr CHD risk of 1%. Suspect MSK vs. GI vs. psych.  She did have a brief episode in the clinic that lasted less than 2 minutes.  NTG was given and her symptoms improved...hard to say whether it was due to the NTG or self resolving. Mammogram was reviewed- nl (looks like she had focal left chest discomfort at that time as well) Pt discussed with Dr. Mauricio Po and agree on further evaluation with ETT after I run it by Dr. Jennette Kettle. In the meantime, will treat with mobic and provide rx for NTG with instructions on when to go to the ER.  Orders: [facility]- Est  Level 4 ([address])  Complete Medication List: 1)  Lortab 10 10-500 Mg Tabs (Hydrocodone-acetaminophen) .Marland Kitchen.. 1 tab by mouth three times a day 2)  Carisoprodol 350 Mg Tabs (Carisoprodol) .... One tab po three times a day as needed for back pain. 3)  Trazodone Hcl 100 Mg Tabs (Trazodone hcl) .... One tab by mouth qhs as needed insomnia 4)  Metoprolol Tartrate 25 Mg Tabs (Metoprolol tartrate) .... One half tab by mouth bid 5)  Ambien 10 Mg Tabs (Zolpidem tartrate) .... One tab by mouth qhs as needed for insomnia 6)  Mobic 7.5 Mg Tabs (Meloxicam) .Marland Kitchen.. 1 tab by mouth daily with food 7)  Nitrostat 0.4 Mg Subl (Nitroglycerin) .... Place underneath the tongue every 5 minutes as needed for chest pain and call your primary provider  Other Orders: EKG-  [facility] (EKG) NTG 1/150 gr tab [facility])  Patient Instructions: 1)  We will contact you regarding the stress test. 2)  I'm starting you on a anti-inflammatory at this time. 3)  If you contiue to have discomfort that is persistent and lasts for more than 20 minutes, go to the emergency room. Prescriptions: NITROSTAT 0.4 MG SUBL (NITROGLYCERIN) place underneath the tongue every 5 minutes as needed for chest pain and call your primary provider  #2 x 0   Entered and Authorized by:   Marisue Ivan  MD   Signed by:   Marisue Ivan  MD on 09/29/2009   Method used:   Print then Give to Patient   RxID:   [id number] MOBIC 7.5 MG TABS (MELOXICAM) 1 tab by mouth daily with food  #14 x 0   Entered and Authorized by:   Marisue Ivan  MD   Signed by:   Marisue Ivan  MD on 09/29/2009   Method used:   Print then Give to Patient   RxID:   [id number]    Medication Administration  Injection # 1:    Medication: Allergy Injection (1)  Medication # 1:    Medication: NTG 1/150 gr tab    Diagnosis: PREMATURE VENTRICULAR CONTRACTIONS (ICD-427.69)    Dose: 1tablets    Route: SL    Exp Date: 10/04/2009    Lot #: [id number]    Mfr: [id number]    Patient tolerated medication without complications    Given by: Loralee Pacas CMA (Sep 29, 2009 9:58 AM)  Orders Added: 1)  EKG- [facility] [EKG] 2)  NTG 1/150 gr tab [EMRORAL] 3)  [facility]- Est  Level 4 [[address]]

## 2010-06-05 NOTE — Miscellaneous (Signed)
Summary: ETT APPT  APPT FOR ETT IS FRI 6.24.11 AT 11:30 [place].  [facility] WILL PROVIDE PT WITH PROTOCAL SHEET

## 2010-06-05 NOTE — Progress Notes (Signed)
 Summary: test res  Phone Note Call from Patient Call back at Home Phone [phone]   Caller: Patient Summary of Call: checking on EEG results. Initial call taken by: Madelin Daring,  March 09, 2009 10:31 AM  Follow-up for Phone Call        will forward  message to MD. [facility] and they will fax over report now. Follow-up by: Avelina Sharps RN,  March 09, 2009 10:33 AM  Additional Follow-up for Phone Call Additional follow up Details #1::        Technically she should call [facility] for EEG results as they performed the test and are better able to interpret it. Additional Follow-up by: Debby Petties MD,  March 09, 2009 2:57 PM     Appended Document: test res she will call them again. states it was done 10/28 & she is anxious for results. she has called them many times. told her she can ask the engineer, manufacturing what their policy is on returning calls from pts

## 2010-06-05 NOTE — Progress Notes (Signed)
 Summary: meds prob  Phone Note Call from Patient Call back at Home Phone [phone]   Caller: Patient Summary of Call: comivent is making her very hyper and can't sleep and feels wired needs something different [facility] Initial call taken by: Karna Seminole,  January 16, 2009 9:00 AM  Follow-up for Phone Call        told her I will forward this to pcp & call her with his response Follow-up by: Ginnie Mau RN,  January 16, 2009 9:25 AM  Additional Follow-up for Phone Call Additional follow up Details #1::        I will add fluticasone  inhaled.  It is a low strength steroid.  She should use it 2x a day every day, EVEN WHEN SHE IS NOT HAVING SYMPTOMS. Additional Follow-up by: Debby Petties MD,  January 17, 2009 10:43 AM    New/Updated Medications: FLOVENT  HFA 44 MCG/ACT AERO (FLUTICASONE  PROPIONATE  HFA) Two puffs inhaled two times a day, even when not having symptoms. Prescriptions: FLOVENT  HFA 44 MCG/ACT AERO (FLUTICASONE  PROPIONATE  HFA) Two puffs inhaled two times a day, even when not having symptoms.  #1 inhaler x 0   Entered and Authorized by:   Debby Petties MD   Signed by:   Debby Petties MD on 01/17/2009   Method used:   Electronically to        [facility]* (retail)       [address]       [place], [place]  [address]       Ph: [phone]       Fax: [phone]   RxID:   [id number]   Appended Document: meds prob no answer at pt's home

## 2010-06-05 NOTE — Progress Notes (Signed)
Summary: phn msg  Phone Note Call from Patient Call back at [facility] Phone [phone]   Caller: Patient Summary of Call: no matter what she eats hurts the area that she has the hernia - wants to know what she should do. Initial call taken by: De Nurse,  March 14, 2010 3:30 PM  Follow-up for Phone Call        There is no hernia, CT proven.  She is constipated however.  She can get OTC stool softener AND senna and take 3-4x a day. Follow-up by: Rodney Langton MD,  March 14, 2010 11:30 PM  Additional Follow-up for Phone Call Additional follow up Details #1::        Gave pt above message from MD. Patient states she has recently taken laxative and lots of large bm over the past few days and is sure she is no longer constipated but has still continued to have significant pain and is wondering what else can be done. Additional Follow-up by: Loralee Pacas CMA,  March 15, 2010 3:57 PM    Additional Follow-up for Phone Call Additional follow up Details #2::    She can take the pain meds she has as directed or use OTC but she has had multiple CT scans and labwork and  there is no further medical cause for her pain and it is likely self limited.   Follow-up by: Rodney Langton MD,  March 15, 2010 7:54 PM

## 2010-06-05 NOTE — Progress Notes (Signed)
Summary: triage  Phone Note Call from Patient Call back at Home Phone [phone]   Caller: Patient Summary of Call: Wondering if Dr. Benjamin Stain would call her in some Chantax.  Wants to talk to Kennon Rounds about it. Initial call taken by: Clydell Hakim,  Sep 29, 2009 1:52 PM  Follow-up for Phone Call        uses [facility]. asked that she get this done today. ready to quit. forgot to mention it at this am's appt. told her I will call her with md's response Follow-up by: Golden Circle RN,  Sep 29, 2009 2:10 PM  Additional Follow-up for Phone Call Additional follow up Details #1::        COme in to see me to talk about its use and side effects etc. Additional Follow-up by: Rodney Langton MD,  Sep 29, 2009 2:47 PM    Additional Follow-up for Phone Call Additional follow up Details #2::    will see Dr. Claudius Sis next Tuesday. really ready to do this now Follow-up by: Golden Circle RN,  Sep 29, 2009 3:10 PM
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
## 2010-06-05 NOTE — Progress Notes (Signed)
Summary: Rx Req  Phone Note Refill Request Call back at Home Phone [phone] Message from:  Patient  Refills Requested: Medication #1:  LORTAB 10 10-500 MG TABS 1 tab by mouth three times a day [facility].  Initial call taken by: Clydell Hakim,  Sep 27, 2009 1:41 PM  Follow-up for Phone Call        PT CALLING AGAIN FOR RX, [place] STATES THEY HAVE BEEN FAXING FOR REFILL SINCE MONDAY Follow-up by: De Nurse,  Sep 28, 2009 9:59 AM  Additional Follow-up for Phone Call Additional follow up Details #1::        paged md & sent this to him. we have no record of this being sent from pharmacy Additional Follow-up by: Golden Circle RN,  Sep 28, 2009 10:03 AM    Additional Follow-up for Phone Call Additional follow up Details #2::    md states he will do this today. pt notified. states she has to wait every month. wants to know if there is some way the refills can be done automatically, so she does not have to wait . to pcp Follow-up by: Golden Circle RN,  Sep 28, 2009 10:08 AM  Additional Follow-up for Phone Call Additional follow up Details #3:: Details for Additional Follow-up Action Taken: No, will do it monthly with controlled substances.  Script waiting at front. Additional Follow-up by: Rodney Langton MD,  Sep 28, 2009 12:22 PM  Prescriptions: LORTAB 10 10-500 MG TABS (HYDROCODONE-ACETAMINOPHEN) 1 tab by mouth three times a day  #90 x 0   Entered and Authorized by:   Rodney Langton MD   Signed by:   Rodney Langton MD on 09/28/2009   Method used:   Print then Give to Patient   RxID:   [id number]  informed pt & faxed to [facility] she states they fax it for her every month.Golden Circle RN  Sep 28, 2009 4:04 PM

## 2010-06-05 NOTE — Assessment & Plan Note (Signed)
Summary: f/up,tcb   Vital Signs:  Patient profile:   50 year old female Height:      64 inches Weight:      102 pounds BMI:     17.57 Temp:     98.4 degrees F oral Pulse rate:   61 / minute BP sitting:   120 / 51  (left arm) Cuff size:   regular  Vitals Entered By: Tessie Fass CMA (February 06, 2010 11:24 AM) Is Patient Diabetic? No Pain Assessment Patient in pain? yes     Location: left shoulder, right hip, and lower back Intensity: 6   Primary Care Provider:  Rodney Langton MD   History of Present Illness: 49 F with hip pain.  CP:  Did not reach HR goal on ETT, sent to [facility], saw Dr. Alanda Amass.  Myoview done and negative.  Still gets occasional CP, no dysphagia when gets the pain.  No reflux Alanda Amass tried omeprazole but this didn't help).    L hip pain:  Worse with palpation, present over greater trochanter, worse with laying on it.  Nothing makes it better, no radiation.  No swelling, warmth.  Pain is sharp.  No trauma.  Habits & Providers  Alcohol-Tobacco-Diet     Tobacco Status: current     Tobacco Counseling: to quit use of tobacco products     Cigarette Packs/Day: 1.0  Current Medications (verified): 1)  Lortab 10 10-500 Mg Tabs (Hydrocodone-Acetaminophen) .Marland Kitchen.. 1 Tab By Mouth Three Times A Day 2)  Carisoprodol 350 Mg Tabs (Carisoprodol) .... One Tab Po Three Times A Day As Needed For Back Pain. 3)  Trazodone Hcl 100 Mg Tabs (Trazodone Hcl) .... One Tab By Mouth Qhs As Needed Insomnia 4)  Metoprolol Tartrate 25 Mg Tabs (Metoprolol Tartrate) .... One Half Tab By Mouth Bid 5)  Ambien 10 Mg Tabs (Zolpidem Tartrate) .... One Tab By Mouth Qhs As Needed For Insomnia  Allergies (verified): 1)  ! Nsaids 2)  ! Neurontin 3)  ! Ultram  Past History:  Past Medical History: D/C`d from [facility] and Dr. Quintella Reichert [facility] to the L2-L3 spine 1981 s/p lumbar spine fusion 03/2002 H/O conversion disorder w/ B LE paralysis (recov) hx abnl pap smear Narcotic,  benzo seeking along with h/o PSA tobacco abuse COPD, PFTs normal in 2010 Sleep study negative for OSA, cataplexy, narcolepsy in 2010 Neurologist workup negative for seizures in 2010 Holter monitor showed sinus tachy and PVCs, dizziness associated with sinus tachy. ETT did not reach adequate HR. Myoview by [facility] 12/2009 negative Upper Endoscopy with normal esophagus, stomach, duodenum, biopsies all negative and do not explain melena.  Social History: Smoking Status:  current Packs/Day:  1.0  Review of Systems       See HPI  Physical Exam  General:  Well-developed,well-nourished,in no acute distress; alert,appropriate and cooperative throughout examination Lungs:  Normal respiratory effort, chest expands symmetrically. Lungs are clear to auscultation, no crackles or wheezes. Heart:  Normal rate and regular rhythm. S1 and S2 normal without gallop, murmur, click, rub or other extra sounds. Msk:  L Hip: ROM IR: 45 Deg, ER: 45 Deg, Flexion: 120 Deg, Extension: 100 Deg, Abduction: 45 Deg, Adduction: 45 Deg Strength IR: 5/5, ER: 5/5, Flexion: 5/5, Extension: 5/5, Abduction: 5/5, Adduction: 5/5 Pelvic alignment unremarkable to inspection and palpation. Standing hip rotation and gait without trendelenburg / unsteadiness. Greater trochanter WITH tenderness to palpation. No tenderness over piriformis and greater trochanter. No SI joint tenderness and normal minimal SI movement.  Additional Exam:  MSK US guided injection performed. [place] used to image L greater trochanter, images saved in longitudinal and transverse planes.  Some edema located just proximal to greater trochanter at point of maximal tenderness.  Consent obtained and verified. Sterile cleansed with alcohol. Topical analgesic spray: Ethyl chloride. Joint: L greater trochanter Approached in typical fashion with needle directed caudally and passed under US probe.  Needle seen tracking into area of edema and tenderness on [place].   Plunger compressed and tissue seen filling with medication, needle fanned out around area. Completed without difficulty Meds: 1cc kenalog 40, 4cc lidocaine 1%, 3cc marcaine. Aftercare instructions and Red flags advised. Pt with immediate improvement in pain and non-antalgic gait.  Post injection images saved demonstrating medication present in space over and just proximal to greater trochanter.     Impression & Recommendations:  Problem # 1:  TROCHANTERIC BURSITIS, LEFT (ICD-726.5) Assessment New Injected. Aftercare advised. SM advisor handout given.  Orders: [facility]- Est  Level 4 (99214) US GUIDED INJECTION ([address])  Problem # 2:  CHEST PAIN, ATYPICAL (ICD-786.59) Assessment: Unchanged Non-cardiac based on negative myoview.  Upper endoscopy also negative so unlikely esophageal spasm.  Most likely MSK/chest wall pain.  Orders: [facility]- Est  Level 4 (60454)  Problem # 3:  TOBACCO DEPENDENCE (ICD-305.1) Assessment: Deteriorated Pt has started smoking again, losing weight.   Advised come back to discuss smoking cessation and chantix.  Complete Medication List: 1)  Lortab 10 10-500 Mg Tabs (Hydrocodone-acetaminophen) .Marland Kitchen.. 1 tab by mouth three times a day 2)  Carisoprodol 350 Mg Tabs (Carisoprodol) .... One tab po three times a day as needed for back pain. 3)  Trazodone Hcl 100 Mg Tabs (Trazodone hcl) .... One tab by mouth qhs as needed insomnia 4)  Metoprolol Tartrate 25 Mg Tabs (Metoprolol tartrate) .... One half tab by mouth bid 5)  Ambien 10 Mg Tabs (Zolpidem tartrate) .... One tab by mouth qhs as needed for insomnia  Appended Document: Orders Update    Clinical Lists Changes  Orders: Added new Test order of Injection, large joint- [facility] ([address]10) - Signed Added new Service order of [place] NDL PLMT IMG S&I [phone]) - Signed Added new Service order of [place] LIMITED ([id number]) - Signed

## 2010-06-05 NOTE — Assessment & Plan Note (Signed)
Summary: Chest tightness and SOB for 3 weeks/ls   Vital Signs:  Patient profile:   50 year old female Weight:      94.8 pounds O2 Sat:      100 % on Room air Temp:     98.4 degrees F oral Pulse rate:   83 / minute Pulse rhythm:   regular BP sitting:   89 / 61  (left arm) Cuff size:   regular  Vitals Entered By: Loralee Pacas CMA (February 20, 2010 10:14 AM)  O2 Flow:  Room air   Primary Care Provider:  Rodney Langton MD   History of Present Illness: 50 yo female with cough, mild SOB.  Present for several weeks now, she did start smoking again.  No CP, recent myoview neg 8/11.  No wheeze, no fevers/chills.  PPD recently neg, CT chest pending for wt loss.  No nasal, ear, eye drainage.  No rash.    Smoking:  1PPD, on chantix 5d now.    Needs refill on lortab, ambien, needs to switch to another muscle relaxant.  Current Medications (verified): 1)  Lortab 10 10-500 Mg Tabs (Hydrocodone-Acetaminophen) .Marland Kitchen.. 1 Tab By Mouth Three Times A Day 2)  Tizanidine Hcl 4 Mg Tabs (Tizanidine Hcl) .... One Tab By Mouth Q6h As Needed For Muscle Spasm. 3)  Trazodone Hcl 100 Mg Tabs (Trazodone Hcl) .... One Tab By Mouth Qhs As Needed Insomnia 4)  Metoprolol Tartrate 25 Mg Tabs (Metoprolol Tartrate) .... One Half Tab By Mouth Bid 5)  Ambien 10 Mg Tabs (Zolpidem Tartrate) .... One Tab By Mouth Qhs As Needed For Insomnia 6)  Megestrol Acetate 800 Mg/35ml Susp (Megestrol Acetate) .... 20ml By Mouth Daily 7)  Chantix 1 Mg Tabs (Varenicline Tartrate) .... 1/2 Tab By Mouth Daily X 3d, Then 1/2 Tab By Mouth Two Times A Day X4d, Then 1 Tab By Mouth Two Times A Day.  Take For 11 Weeks.  Stop Smoking After The First Week. 8)  Tessalon 200 Mg Caps (Benzonatate) .... One Tab By Mouth Three Times A Day As Needed For Cough  Allergies (verified): 1)  ! Nsaids 2)  ! Neurontin 3)  ! Ultram  Past History:  Past Medical History: Last updated: 02/06/2010 D/C`d from [facility] and Dr. 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 ETT did not reach adequate HR. Myoview by [facility] 12/2009 negative Upper Endoscopy with normal esophagus, stomach, duodenum, biopsies all negative and do not explain melena.  Review of Systems       See HPI  Physical Exam  General:  Well-developed,well-nourished,in no acute distress; alert,appropriate and cooperative throughout examination Head:  Normocephalic and atraumatic without obvious abnormalities. No apparent alopecia or balding. Eyes:  No corneal or conjunctival inflammation noted. EOMI. Perrla.  Ears:  External ear exam shows no significant lesions or deformities.  Otoscopic examination reveals clear canals, tympanic membranes are intact bilaterally without bulging, retraction, inflammation or discharge. Hearing is grossly normal bilaterally. Nose:  External nasal examination shows no deformity or inflammation. Nasal mucosa are pink and moist without lesions or exudates. Mouth:  Oral mucosa and oropharynx without lesions or exudates.   Neck:  No deformities, masses, or tenderness noted. Lungs:  Normal respiratory effort, chest expands symmetrically. Lungs are clear to auscultation, no crackles or wheezes. Heart:  Normal rate and regular rhythm. S1 and S2 normal without gallop, murmur, click, rub or other extra sounds. Abdomen:  Bowel sounds positive,abdomen soft and non-tender without masses, organomegaly or hernias noted. Extremities:  No edema.   Impression & Recommendations:  Problem # 1:  TOBACCO DEPENDENCE (ICD-305.1) Assessment Unchanged Pt may have mild URI however all  symptoms exacerbated by smoking.   SPO2 100%, RR normal, lung exam unremarkable. Advised tessalon perles until results of CT avail. Stop smoking in 2d.   Her updated medication list for this problem includes:    Chantix 1 Mg Tabs (Varenicline tartrate) .Marland Kitchen... 1/2 tab by mouth daily x 3d, then 1/2 tab by mouth two times a day x4d, then 1 tab by mouth two times a day.  take for 11 weeks.  stop smoking after the first week.  Orders: [facility]- Est  Level 4 (99214)  Problem # 2:  SCREENING EXAMINATION FOR PULMONARY TUBERCULOSIS (ICD-V74.1) Assessment: Improved PPD neg.  Orders: [facility]- Est  Level 4 ([address])  Problem # 3:  WEIGHT LOSS, ABNORMAL (ICD-783.21) Assessment: Unchanged Awaiting CT chest today. Pt has appt with Dr. Gerilyn Pilgrim. 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  Orders: [facility]- Est  Level 4 (99214)  Problem # 4:  BACK PAIN, LOW (ICD-724.2) Assessment: Unchanged Changed soma to zanaflex.  Her updated medication list for this problem includes:    Lortab 10 10-500 Mg Tabs (Hydrocodone-acetaminophen) .Marland Kitchen... 1 tab by mouth three times a day    Tizanidine Hcl 4 Mg Tabs (Tizanidine hcl) ..... One tab by mouth q6h as needed for muscle spasm.  Orders: [facility]- Est  Level 4 (99214)  Complete Medication List: 1)  Lortab 10 10-500 Mg Tabs (Hydrocodone-acetaminophen) .Marland Kitchen.. 1 tab by mouth three times a day 2)  Tizanidine Hcl 4 Mg Tabs (Tizanidine hcl) .... One tab by mouth q6h as needed for muscle spasm. 3)  Trazodone Hcl 100 Mg Tabs (Trazodone hcl) .... One tab by mouth qhs as needed insomnia 4)  Metoprolol Tartrate 25 Mg Tabs (Metoprolol tartrate) .... One half tab by mouth bid 5)  Ambien 10 Mg Tabs (Zolpidem tartrate) .... One tab by mouth qhs as needed for insomnia 6)  Megestrol Acetate 800 Mg/80ml Susp (Megestrol acetate) .... 20ml by mouth daily 7)  Chantix 1 Mg Tabs (Varenicline tartrate) .... 1/2 tab by mouth daily x 3d, then 1/2 tab by mouth two times a day x4d, then 1 tab by mouth two  times a day.  take for 11 weeks.  stop smoking after the first week. 8)  Tessalon 200 Mg Caps (Benzonatate) .... One tab by mouth three times a day as needed for cough  Patient Instructions: 1)  Refilled ambien, lortab. 2)  Tessalon for cough. 3)  Change soma to tizanadine. 4)  CT scan today. 5)  Stop smoking in 2d. 6)  Don't forget appt with Dr. Gerilyn Pilgrim. 7)  Keep taking Megace. 8)  Come back to see me as needed. 9)  -Dr. Karie Schwalbe. Prescriptions: AMBIEN 10 MG TABS (ZOLPIDEM TARTRATE) One tab by mouth qHS as needed for insomnia  #30 x 2   Entered and Authorized by:   Rodney Langton MD   Signed by:   Rodney Langton MD on 02/20/2010   Method used:   Print then Give to Patient   RxID:   [id number] LORTAB 10 10-500 MG TABS (HYDROCODONE-ACETAMINOPHEN) 1  tab by mouth three times a day  #90 x 0   Entered and Authorized by:   Rodney Langton MD   Signed by:   Rodney Langton MD on 02/20/2010   Method used:   Print then Give to Patient   RxID:   [id number] TESSALON 200 MG CAPS (BENZONATATE) One tab by mouth three times a day as needed for cough  #90 x 0   Entered and Authorized by:   Rodney Langton MD   Signed by:   Rodney Langton MD on 02/20/2010   Method used:   Print then Give to Patient   RxID:   [id number] TIZANIDINE HCL 4 MG TABS (TIZANIDINE HCL) One tab by mouth q6h as needed for muscle spasm.  #90 x 6   Entered and Authorized by:   Rodney Langton MD   Signed by:   Rodney Langton MD on 02/20/2010   Method used:   Print then Give to Patient   RxID:   [id number]    Orders Added: 1)  [facility]- Est  Level 4 [74259]

## 2010-06-05 NOTE — Letter (Signed)
Summary: Patient Complaint  From:    Paula Duncan   Sent:   Wednesday, May 24, 2009 5:11 PM To:   Paula Duncan Subject:   Patient Complaint  Patient:  Paula Duncan       DOB:  2061/02/15  Address:  [address]                 [place], [place]  [address]     Telephone:  [phone]  I see Paula. Margret Duncan at the [facility].  [place] is [place].  I have COPD and in July of this past year I was put on life support for seven days.  Stayed until July 12th.  I have pins and rods in my back.  I was up there yesterday to see Paula. Burnadette Duncan.  He is not my normal doctor.  I am having problems with my stomach and sleeping.  I was trying to explain to him what was going on and he said he had other patients to see and we just needed to direct what I came in there for.  I am on muscle relaxers, something for nausea, which is not helping me stomach which I Imitrex for headache, cream for psoriasis on my knees. Lortab, Soma, inhalers (two kinds), Topiramate a seizure medication but I really don'Duncan take that, Flonase but I don'Duncan use that because that doesn'Duncan help my sinuses.  He gave me Trazodone to try for sleep.  Okay.  On December 23rd I had my medications refilled for my Lortab.  I just calledwas up there to see the doctor yesterday but I could not address the fact of my Lortab because he said he had other patients and he just wanted to see me for my stomach.  So I left a note at the nurse's station and the nurse just called me back today at 4:15 and told me that Paula. Burnadette Duncan would not refill my medication because of all of the medication that I'm on.  I have only seen Paula. Burnadette Duncan two times because Paula. Karie Duncan was not there.  My pain medication is not something they mess with.  I have been going over there for 20 some years.  I don'Duncan understand what the problem is.  This is the first time I've been hurried out of the office and I don'Duncan feel like a patient unless I see Paula. Karie Duncan. I don'Duncan feel like a  patient at all.  I feel like a dollar sign or a time limit.  It is pathetic if doctors have gone that way. I did not mean to jump on the nurse like I did but if this doctor could look at my chart and see that I have pain because I've had pain medication.  If he doesn'Duncan want to refill my medication then he needs to just refer me to another doctor there.      Discussed with Paula Duncan and patient.  He felt ok to refill just not early.  Pt ok with being refilled on 1/22 Saturday.  Will call this in to her pharmacy.  She should discuss chronic use of narcotics and pain contract on Paula Duncan return Paula Brownie MD  May 25, 2009 9:40 AM   RX called to [facility] for Lortab 10/500 mg. advised ok to fill on 05/27/2009. Theresia Lo RN  May 25, 2009 10:51 AM

## 2010-06-05 NOTE — Progress Notes (Signed)
 Summary: another question  Phone Note Call from Patient Call back at Home Phone [phone]   Caller: Patient Summary of Call: pt has an additional question for triage nurse about the dehydration.   Initial call taken by: Georgianna Gelineau,  November 21, 2008 10:44 AM  Follow-up for Phone Call        c/o liquid diarrhea > 1wk 3 or 4 times a day. will try immodium & drink lots of water. if not improved call in early am for same day appt. states she had this when she was in the hospital as well. she declined an appt today Follow-up by: Ginnie Mau RN,  November 21, 2008 10:56 AM  Additional Follow-up for Phone Call Additional follow up Details #1::        That plan sounds good, with liquid diarrhea after her hospitalization (and lots of abx in the hospital, worried about c-diff) I want her to take Metronidazole 500mg  by mouth three times a day x14 days.  I will call that in.  She should come see me if not starting to improve in 4-5 days. Additional Follow-up by: Debby Petties MD,  November 21, 2008 12:29 PM    New/Updated Medications: METRONIDAZOLE 500 MG TABS (METRONIDAZOLE) One tab by mouth three times a day x14 days Prescriptions: METRONIDAZOLE 500 MG TABS (METRONIDAZOLE) One tab by mouth three times a day x14 days  #42 x 0   Entered and Authorized by:   Debby Petties MD   Signed by:   Debby Petties MD on 11/21/2008   Method used:   Electronically to        [facility]* (retail)       [address]       [place], [place]  [address]       Ph: [phone]       Fax: [phone]   RxID:   [id number]   Appended Document: another question pt notified about med & need to come back if not improved after 4-5 days

## 2010-06-05 NOTE — Consult Note (Signed)
Summary: [facility] - pt DC'ed 2/2 not following up  [facility]   Imported By: Clydell Hakim 10/02/2009 08:39:58  _____________________________________________________________________  External Attachment:    Type:   Image     Comment:   External Document

## 2010-06-05 NOTE — Miscellaneous (Signed)
 Summary: refill  Clinical Lists Changes wants refill on promethezine. understands she needs to wait until the end of the month for the other med.Raejean Mau RN  March 22, 2009 3:44 PM

## 2010-06-05 NOTE — Progress Notes (Signed)
Summary: phn msg  Phone Note Call from Patient Call back at [phone]   Caller: Patient Summary of Call: needs to talk to nurse about cardiology referral Initial call taken by: De Nurse,  October 30, 2009 12:20 PM  Follow-up for Phone Call        patient is wanting to know results of ETT. Follow-up by: Theresia Lo RN,  October 30, 2009 4:44 PM  Additional Follow-up for Phone Call Additional follow up Details #1::        Results were not entirely conclusive  because she didn't reach the max level.  She will need another form of stress test that involves injecting a medicine.  She won't have to physically exert herself for this test, just lay and relax.   Will also route this to [facility] team for updates on status of referral. Additional Follow-up by: Rodney Langton MD,  October 30, 2009 8:24 PM    Additional Follow-up for Phone Call Additional follow up Details #2::    sent this to Lyn and she will take care of this Follow-up by: Loralee Pacas CMA,  October 31, 2009 10:49 AM  Additional Follow-up for Phone Call Additional follow up Details #3:: Details for Additional Follow-up Action Taken: [facility]! Additional Follow-up by: Rodney Langton MD,  October 31, 2009 11:01 AM   Appended Document: phn msg patient notified.

## 2010-06-05 NOTE — Progress Notes (Signed)
  Phone Note Outgoing Call Call back at [facility] Phone [phone]   Call placed by: Debby Petties MD,  December 08, 2008 8:53 AM Summary of Call: Pt req to be informed of lab results.  Let her know they were normal, K+ normalized.  Her breathing is better, she knows red flags to prompt RTC. Initial call taken by: Debby Petties MD,  December 08, 2008 8:56 AM

## 2010-06-05 NOTE — Progress Notes (Signed)
Summary: Rx Req  Phone Note Refill Request Call back at Home Phone [phone] Message from:  Patient  Refills Requested: Medication #1:  LORTAB 10 10-500 MG TABS 1 tab by mouth three times a day Pt uses [facility]  Initial call taken by: Clydell Hakim,  June 30, 2009 10:48 AM  Follow-up for Phone Call        At front. Pt may pick up. Follow-up by: Rodney Langton MD,  June 30, 2009 1:46 PM  Additional Follow-up for Phone Call Additional follow up Details #1::        Can this be faxed to the pharmacy. Additional Follow-up by: Clydell Hakim,  July 03, 2009 9:25 AM    Additional Follow-up for Phone Call Additional follow up Details #2::    I called it in to her pharmacy Follow-up by: Golden Circle RN,  July 03, 2009 10:35 AM  Prescriptions: LORTAB 10 10-500 MG TABS (HYDROCODONE-ACETAMINOPHEN) 1 tab by mouth three times a day  #90 x 0   Entered and Authorized by:   Rodney Langton MD   Signed by:   Rodney Langton MD on 06/30/2009   Method used:   Print then Give to Patient   RxID:   [id number]
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
## 2010-06-05 NOTE — Consult Note (Signed)
[facility]   Imported By: Clydell Hakim 05/12/2009 11:14:39  _____________________________________________________________________  External Attachment:    Type:   Image     Comment:   External Document

## 2010-06-05 NOTE — Letter (Signed)
 Summary: EEG  EEG   Imported By: Madelin Daring 03/14/2009 12:21:03  _____________________________________________________________________  External Attachment:    Type:   Image     Comment:   External Document

## 2010-06-05 NOTE — Assessment & Plan Note (Signed)
Summary: BACK PAIN/KH   Vital Signs:  Patient profile:   50 year old female Weight:      98 pounds Temp:     98.1 degrees F oral Pulse rate:   80 / minute Pulse rhythm:   regular BP sitting:   111 / 71  (left arm) Cuff size:   regular  Vitals Entered By: Loralee Pacas CMA (April 10, 2010 10:17 AM) CC: back pain Pain Assessment Patient in pain? yes     Location: lower back Intensity: 8 Onset of pain  Constant   Primary Care Milos Milligan:  Rodney Langton MD  CC:  back pain.  History of Present Illness: 50 yo female here for fu of LBP.  She has had lumbar fusion years ago.  Pain has been constant and she has been on lortab 10/500 for a long time now.  This is now becoming ineffective.  No new bowel/bladder issues.  Strength preserved in lower ext. Pain is sharp and at midline under incision.  No radiation.  She also has some pain localized to her left PSIS.    Current Medications (verified): 1)  Oxycodone-Acetaminophen 5-500 Mg Caps (Oxycodone-Acetaminophen) .... One Tab By Mouth Three Times A Day As Needed For Pain. 2)  Tizanidine Hcl 4 Mg Tabs (Tizanidine Hcl) .... One Tab By Mouth Q6h As Needed For Muscle Spasm. 3)  Trazodone Hcl 100 Mg Tabs (Trazodone Hcl) .... One Tab By Mouth Qhs As Needed Insomnia 4)  Metoprolol Tartrate 25 Mg Tabs (Metoprolol Tartrate) .... One Half Tab By Mouth Bid 5)  Ambien 10 Mg Tabs (Zolpidem Tartrate) .... One Tab By Mouth Qhs As Needed For Insomnia 6)  Megestrol Acetate 800 Mg/76ml Susp (Megestrol Acetate) .... 20ml By Mouth Daily 7)  Tessalon 200 Mg Caps (Benzonatate) .... One Tab By Mouth Three Times A Day As Needed For Cough  Allergies (verified): 1)  ! Nsaids 2)  ! Neurontin 3)  ! Ultram  Review of Systems       See HPI  Physical Exam  General:  Well-developed,well-nourished,in no acute distress; alert,appropriate and cooperative throughout examination Msk:  Back Exam: Inspection: Well healed surgical scars noted. Motion: 45  deg flexion 30 deg extension, 45 deg side bending.  Good rotation. SLR lying: NEG Palpable tenderness:Along incision as well as on L PSIS. Sensory change: None Reflex change: None  Strength at foot Plantar-flexion:  5/ 5    Dorsi-flexion:  5/ 5    Eversion: 5 / 5   Inversion:  5/ 5 Leg strength Quad:  5/ 5   Hamstring:  5/ 5   Hip flexor:  5/ 5   Hip abductors:  5/ 5 Additional Exam:  Consent obtained and verified. Sterile alcohol prep. Topical analgesic spray: Ethyl chloride. Trigger point x1 at PSIS. Approached in typical fashion with:needle advanced into most tender area at PSIS, 1cc kenalog 40 and 1 cc lidocaine injected into a fan-like fashion. Completed without difficulty Meds: 1cc kenalog 40 and 1 cc lidocaine. Needle: 25g Aftercare instructions and Red flags advised.   Impression & Recommendations:  Problem # 1:  BACK PAIN, LOW (ICD-724.2) Assessment Deteriorated XR to ensure no loosening of hardware. (Orthopaedist has fired this patient) Increasing from Lortab to [employer]. Trigger pt injection into tender areas at PSIS. RTC as needed.  Her updated medication list for this problem includes:    Oxycodone-acetaminophen 5-500 Mg Caps (Oxycodone-acetaminophen) ..... One tab by mouth three times a day as needed for pain.    Tizanidine Hcl  4 Mg Tabs (Tizanidine hcl) ..... One tab by mouth q6h as needed for muscle spasm.  Orders: Diagnostic X-Ray/Fluoroscopy (Diagnostic X-Ray/Flu) [facility]- Est  Level 4 (60454) Trigger point injection- [facility] (09811)  Complete Medication List: 1)  Oxycodone-acetaminophen 5-500 Mg Caps (Oxycodone-acetaminophen) .... One tab by mouth three times a day as needed for pain. 2)  Tizanidine Hcl 4 Mg Tabs (Tizanidine hcl) .... One tab by mouth q6h as needed for muscle spasm. 3)  Trazodone Hcl 100 Mg Tabs (Trazodone hcl) .... One tab by mouth qhs as needed insomnia 4)  Metoprolol Tartrate 25 Mg Tabs (Metoprolol tartrate) .... One half tab by mouth bid 5)   Ambien 10 Mg Tabs (Zolpidem tartrate) .... One tab by mouth qhs as needed for insomnia 6)  Megestrol Acetate 800 Mg/65ml Susp (Megestrol acetate) .... 20ml by mouth daily 7)  Tessalon 200 Mg Caps (Benzonatate) .... One tab by mouth three times a day as needed for cough Prescriptions: OXYCODONE-ACETAMINOPHEN 5-500 MG CAPS (OXYCODONE-ACETAMINOPHEN) One tab by mouth three times a day as needed for pain.  #90 x 0   Entered and Authorized by:   Rodney Langton MD   Signed by:   Rodney Langton MD on 04/10/2010   Method used:   Print then Give to Patient   RxID:   [id number]    Orders Added: 1)  Diagnostic X-Ray/Fluoroscopy [Diagnostic X-Ray/Flu] 2)  [facility]- Est  Level 4 [78469] 3)  Trigger point injection- [facility] [62952]

## 2010-06-05 NOTE — Progress Notes (Signed)
 Summary: results  Phone Note Call from Patient Call back at Home Phone [phone]   Caller: Patient Summary of Call: pt is wanting results of CXR  Initial call taken by: Karna Seminole,  March 01, 2009 3:17 PM  Follow-up for Phone Call        told her no active process. she is going for EEG & will have them fax results here Follow-up by: Ginnie Mau RN,  March 01, 2009 3:25 PM  Additional Follow-up for Phone Call Additional follow up Details #1::        Thanks. Additional Follow-up by: Debby Petties MD,  March 01, 2009 3:56 PM

## 2010-06-05 NOTE — Progress Notes (Signed)
Summary: results  Phone Note Call from Patient Call back at Home Phone [phone]   Caller: Patient Summary of Call: needs to talk to nurse about holter monitor. Initial call taken by: De Nurse,  May 12, 2009 11:36 AM  Follow-up for Phone Call        told her we do not have results yet. when they come in, md usually calls. she has f/u appt in Feb. could not get one sooner  also the flonase has not helped at all. wants to know what else she can try Follow-up by: Golden Circle RN,  May 12, 2009 11:38 AM  Additional Follow-up for Phone Call Additional follow up Details #1::        Can stop flonase, may try Neti-pot, vaporizer, OTC antihistamines (Claritin, Cetirzine).  Avoid chronically using decongestants as this results in rebound. Additional Follow-up by: Rodney Langton MD,  May 13, 2009 6:20 AM    Additional Follow-up for Phone Call Additional follow up Details #2::    gave her above suggestions & she wants to try otc. refused appt Follow-up by: Golden Circle RN,  May 15, 2009 8:52 AM  Additional Follow-up for Phone Call Additional follow up Details #3:: Details for Additional Follow-up Action Taken: Noted. Additional Follow-up by: Rodney Langton MD,  May 15, 2009 2:21 PM

## 2010-06-05 NOTE — Miscellaneous (Signed)
Summary: pain meds  Clinical Lists Changes  Patient came in to see Dr. Burnadette Pop today, after appt patient states she needed hydrocodone refilled. Uses [facility], message to MD...............................................Marland KitchenGaren Grams LPN May 23, 2009 9:38 AM  See OV notes from earlier.  Pt just ahd refill last month.  Dr. Burnadette Pop not comfortable refilling.  Dennison Nancy RN  May 23, 2009 4:30 PM  Spoke with Pt and explained situation.  Pt stated that she could not and would not stop her pain meds, that she has an appt with Dr T in Feb but with her history she needs her pain meds.   Pt not happy about not recieving pain meds  and wanted to speak with someone else.  Forwarding to [place] to see if we could assist pt in another way.  Stated she would go to Dr Sheffield Slider if she had to.  Marland KitchenGladstone Pih  May 24, 2009 4:25 PM  Will Contact patient see future note [name] MD  May 25, 2009 9:17 AM

## 2010-06-05 NOTE — Miscellaneous (Signed)
Summary: MSSG to Dr T  Clinical Lists Changes Dr T I have routed her ETT report to you. I recommend we send her to cardiology for further eval--I told her she would hear something from  [place] ([place] by next Tuesday the 28th. Call me with questions  Denny Levy MD  October 27, 2009 2:03 PM  Noted, will send her to cardiology for non-reassuring ETT. Rodney Langton MD  October 28, 2009 10:46 AM

## 2010-06-05 NOTE — Progress Notes (Signed)
Summary: phn msg  Phone Note Call from Patient Call back at [phone]   Caller: Patient Summary of Call: having stress test tomorrow and wants to know if she should take her heart meds in the morning Initial call taken by: De Nurse,  October 26, 2009 2:52 PM  Follow-up for Phone Call        patient advised that  she can take her rmetoprolol tomorrow AM with a little water. Follow-up by: Theresia Lo RN,  October 26, 2009 3:35 PM

## 2010-06-05 NOTE — Assessment & Plan Note (Signed)
Summary: 11:30-ETT,ATYPICAL CHEST PAIN,[facility]   Vital Signs:  Patient profile:   50 year old female BP sitting:   96 / 70  Exercise Tolerance Test Cardiovascular Risk History:      Positive major cardiovascular risk factors include hyperlipidemia.  Negative major cardiovascular risk factors include female age < 56 years old, no history of diabetes, no history of hypertension, negative family history for ischemic heart disease, and non-tobacco-user status.    Baseline EKG:    Rhythm:     normal sinus    Rate:       80  Exercise Tolerance Test Results:    Indication for ETT:     chest pain-rule out ischemia    Stress Modality:     exercise-treadmill    Maximum BP:        140 / 63    MPHR (bpm):        169    85% MPHR (bpm):     144    MHR obtained (bpm):        131    ST Segment analysis:       At Rest:       normal ST segments-no evidence of significant ST depression       With Exercise:     no evidence of significant ST depression    Arrhythmia:             no  Allergies: 1)  ! Nsaids 2)  ! Neurontin 3)  ! Ultram   Complete Medication List: 1)  Lortab 10 10-500 Mg Tabs (Hydrocodone-acetaminophen) .Marland Kitchen.. 1 tab by mouth three times a day 2)  Carisoprodol 350 Mg Tabs (Carisoprodol) .... One tab po three times a day as needed for back pain. 3)  Trazodone Hcl 100 Mg Tabs (Trazodone hcl) .... One tab by mouth qhs as needed insomnia 4)  Metoprolol Tartrate 25 Mg Tabs (Metoprolol tartrate) .... One half tab by mouth bid 5)  Ambien 10 Mg Tabs (Zolpidem tartrate) .... One tab by mouth qhs as needed for insomnia 6)  Mobic 7.5 Mg Tabs (Meloxicam) .Marland Kitchen.. 1 tab by mouth daily with food 7)  Nitrostat 0.4 Mg Subl (Nitroglycerin) .... Place underneath the tongue every 5 minutes as needed for chest pain and call your primary provider  Cardiovascular Risk Assessment/Plan:      The patient's hypertensive risk group is category B: At least one risk factor (excluding diabetes) with no target organ  damage.  Her calculated 10 year risk of coronary heart disease is 5 %.  Today's blood pressure is 96/70.    Exercise Tolerance Test Assessment:    Quality of ETT:   indeterminate    ETT Interpretation:   normal-no evidence of ischemia by ST analysis    Comments:     Patient did not reach THR, requested to stop treadmill in stage  2.  Immediately after stopping she experienced 8/10 chest pain ---EKG at that time showed no ST changes. She continued to have chest pain until 10 minutes into recovery. It was resolved with one SL NTG. Her Blood pressure remained stable. She did develop a headache (from NTG) which was 2/10 and was improving when she left. At time of discharge, she had 1-2/10 headache, no chest pain, BP 110/76. She was ambulating and conversing normally.    Recommendations:   Non reassuring ETT. Indeterminant as patient did not reach THR and only reached 6 METS. Given her episode of chest pain immediately after stopping ETT, I  would recommend she have further cardiac work up.   Cardiovascular Risk Assessment/Plan:      The patient's hypertensive risk group is category B: At least one risk factor (excluding diabetes) with no target organ damage.  Her calculated 10 year risk of coronary heart disease is 5 %.  Today's blood pressure is 96/70.    Exercise Tolerance Test Assessment:    Quality of ETT:   indeterminate    ETT Interpretation:   normal-no evidence of ischemia by ST analysis    Comments:     Patient did not reach THR, requested to stop treadmill in stage  2.  Immediately after stopping she experienced 8/10 chest pain ---EKG at that time showed no ST changes. She continued to have chest pain until 10 minutes into recovery. It was resolved with one SL NTG. Her Blood pressure remained stable. She did develop a headache (from NTG) which was 2/10 and was improving when she left. At time of discharge, she had 1-2/10 headache, no chest pain, BP 110/76. She was ambulating and conversing  normally.    Recommendations:   Non reassuring ETT. Indeterminant as patient did not reach THR and only reached 6 METS. Given her episode of chest pain immediately after stopping ETT, I would recommend she have further cardiac work up.    Appended Document: 11:30-ETT,ATYPICAL CHEST PAIN,[facility] Also notable was prolonged QTc which started at 450 and was 482 at end of test  Appended Document: 11:30-ETT,ATYPICAL CHEST [facility]    Clinical Lists Changes  Orders: Added new Referral order of Cardiology Referral (Cardiology) - Signed

## 2010-06-05 NOTE — Progress Notes (Signed)
 Summary: Rx Req  Phone Note Refill Request Call back at Home Phone [phone] Message from:  Patient  Refills Requested: Medication #1:  CARISOPRODOL  350 MG TABS One tab Po three times a day as needed for back pain. PT USES [facility] PT STATES THAT IS SHOULD BE A REFILL FOR 90.  Initial call taken by: Madelin Daring,  March 22, 2009 2:00 PM  Follow-up for Phone Call        will forward to MD. Follow-up by: Avelina Sharps RN,  March 22, 2009 2:10 PM  Additional Follow-up for Phone Call Additional follow up Details #1::        Too soon, already rx'ed #30x6 for a TID med at the end of September, will consider refill at the end of the month. Additional Follow-up by: Debby Petties MD,  March 22, 2009 3:27 PM

## 2010-06-05 NOTE — Progress Notes (Signed)
 Summary: triage  Phone Note Call from Patient Call back at Home Phone [phone]   Caller: Patient Summary of Call: pt privates puffy and red. Initial call taken by: Madelin Daring,  October 31, 2008 10:23 AM  Follow-up for Phone Call        also itchy. cannot come in until Wed. appt at 8:30 Wed with Dr. Joyice Follow-up by: Ginnie Mau RN,  October 31, 2008 10:25 AM  Additional Follow-up for Phone Call Additional follow up Details #1::        Good, she needs to be seen ASAP for this.    Thanks. Additional Follow-up by: Debby Petties MD,  October 31, 2008 10:46 AM
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
## 2010-06-05 NOTE — Progress Notes (Signed)
 Summary: triage  Phone Note Call from Patient Call back at Home Phone [phone]   Caller: Patient Summary of Call: pt has really bad headache and nausated.  Wants something called in. Initial call taken by: Madelin Daring,  March 14, 2009 4:10 PM  Follow-up for Phone Call        HA is 7/10. has taken vicodin & tylenol . has had this for days. very nauseous. wants something for this if unable to get different med for pain Follow-up by: Ginnie Mau RN,  March 14, 2009 4:18 PM  Additional Follow-up for Phone Call Additional follow up Details #1::        pt informed about med & appt made for tomorrow am Additional Follow-up by: Ginnie Mau RN,  March 14, 2009 4:30 PM    Additional Follow-up for Phone Call Additional follow up Details #2::    Thanks, noted. Follow-up by: Debby Petties MD,  March 15, 2009 7:26 AM  New/Updated Medications: PROMETHAZINE  HCL 25 MG TABS (PROMETHAZINE  HCL) 1 by mouth q8 hrs as needed nausea Prescriptions: PROMETHAZINE  HCL 25 MG TABS (PROMETHAZINE  HCL) 1 by mouth q8 hrs as needed nausea  #15 x 0   Entered and Authorized by:   Camie Mulch MD   Signed by:   Camie Mulch MD on 03/14/2009   Method used:   Electronically to        [facility]* (retail)       [address]       [place], [place]  [address]       Ph: [phone]       Fax: [phone]   RxID:   [id number]
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
## 2010-06-05 NOTE — Assessment & Plan Note (Signed)
Summary: NP/nutr. appt per dr t/eo   Vital Signs:  Patient profile:   50 year old female Height:      64 inches Weight:      103.7 pounds BMI:     17.86  Vitals Entered By: Wyona Almas PHD (March 20, 2010 3:07 PM)  Primary Care Provider:  Rodney Langton MD   History of Present Illness: Assessment:  Spent 60 min w/ pt.  Usual eating pattern includes grazing, no breakfast, lunch and dinner.  Everyday foods/beverages include  ~100 oz Coke, 12 oz Sprite, bedtime ice cream,  and food "kicks" such as chx & dumplings.  Usual exercise routine includes  ~30 min/day walking the dogs.  24-hr recall suggests intake of 4380 kcal, >1000 kcal was from Coke: B (10 AM)- 36 oz Coke; L (11:30 AM)- pb sandwich, 1 oz chips, 12 oz Coke, 2 X Tootsie Pops; Snk (2 PM)-  pb sandwich, 1 oz, chips, 12 oz Coke; D (6 PM)- Fried chx thigh, baked potato w/ 1 tbsp margarine, 2 slc toast w/ 2 tsp margarine, 12 oz Coke; Snk (7:30 PM)- 1 oz chips, 12 oz Coke, 4 c ice cream, 1/2 c pudding, 1/2 canned pears, handful chips.  Ms. Clippinger said this is a typical intake, but it certainly does not reflect expected kcal intake that would support her weight.  Furthermore, it contradicts her assertion that she cannot eat much b/c of abdominal pain.  She said it "bothers the crap out of me when someone says it's in my head."  She said she knows what to eat, but does not eat much b/c of pain.  She does not like taking medicine, so has not been taking Megace. She can sometimes go as long as 3 wks w/out a bowel movement, although she said more typical is  ~1-2 X wk.    Nutrition Diagnosis:  Inappropriate intake of types of carbohydrate (NI-53.3) related to fruits, veg's, and other fiber sources as evidenced by yesterday's intake of no veg's, 1 serving of canned fruit, and no other fiber sources.  Excessive bioactive substance intake (NI 4.2; caffeine) related to beverage intake as evidenced by 96-108 oz of Coke per day.    Intervention:  See Patient Instructions.    Monitoring/Eval:  F/U per patient.     Allergies: 1)  ! Nsaids 2)  ! Neurontin 3)  ! Ultram   Complete Medication List: 1)  Lortab 10 10-500 Mg Tabs (Hydrocodone-acetaminophen) .Marland Kitchen.. 1 tab by mouth three times a day 2)  Tizanidine Hcl 4 Mg Tabs (Tizanidine hcl) .... One tab by mouth q6h as needed for muscle spasm. 3)  Trazodone Hcl 100 Mg Tabs (Trazodone hcl) .... One tab by mouth qhs as needed insomnia 4)  Metoprolol Tartrate 25 Mg Tabs (Metoprolol tartrate) .... One half tab by mouth bid 5)  Ambien 10 Mg Tabs (Zolpidem tartrate) .... One tab by mouth qhs as needed for insomnia 6)  Megestrol Acetate 800 Mg/73ml Susp (Megestrol acetate) .... 20ml by mouth daily 7)  Tessalon 200 Mg Caps (Benzonatate) .... One tab by mouth three times a day as needed for cough  Other Orders: Inital Assessment Each - [facility] [phone])  Patient Instructions: 1)  I strongly encourage you to cut down on the Coke you drink.  I think it's a good possibility that this adds to your stomach pain.   2)  Increase intake of water, tea, hot chocolate (made with 1% or fat-free milk).  You may want to try  some herb teas.   3)  Yesterday's intake included no dairy foods, fruits, and no vegetables.   4)  Get at least 2 servings of veg's daily and at least 3 fruits per day.  Fresh or frozen veg's are best.   5)  Also:  Aim for beans at least 2 X wk.   6)  The added beans, fruits, and veg's are likely to be very helpful in your colon function.     Orders Added: 1)  Inital Assessment Each - [facility] [[id number]]

## 2010-06-05 NOTE — Progress Notes (Signed)
Summary: Rx Req  Phone Note Refill Request Call back at Home Phone [phone] Message from:  Patient  Refills Requested: Medication #1:  LORTAB 10 10-500 MG TABS 1 tab by mouth three times a day PT USES [facility].  Initial call taken by: Clydell Hakim,  May 24, 2009 11:22 AM  Follow-up for Phone Call        will forward to MD. Follow-up by: Theresia Lo RN,  May 24, 2009 11:33 AM  Additional Follow-up for Phone Call Additional follow up Details #1::        Will address see future note Additional Follow-up by: [name] MD,  May 25, 2009 9:17 AM

## 2010-06-05 NOTE — Progress Notes (Signed)
Summary: refill  Phone Note Refill Request Call back at Home Phone [phone] Message from:  Patient  Refills Requested: Medication #1:  LORTAB 10 10-500 MG TABS 1 tab by mouth three times a day pt has been out since Friday  Initial call taken by: De Nurse,  August 28, 2009 3:23 PM  Follow-up for Phone Call        to pcp Follow-up by: Golden Circle RN,  August 28, 2009 3:27 PM  Additional Follow-up for Phone Call Additional follow up Details #1::        pt calling back and asking can someone else o.k. meds. Additional Follow-up by: Clydell Hakim,  August 29, 2009 12:01 PM    Additional Follow-up for Phone Call Additional follow up Details #2::    pcp is on vacation. sent to Dr. [place] Follow-up by: Golden Circle RN,  August 29, 2009 12:05 PM  Additional Follow-up for Phone Call Additional follow up Details #3:: Details for Additional Follow-up Action Taken: pt has called again. wants the meds! sent to Dr. Jennette Kettle to see if she will do this Additional Follow-up by: Golden Circle RN,  August 29, 2009 2:04 PM  Prescriptions: LORTAB 10 10-500 MG TABS (HYDROCODONE-ACETAMINOPHEN) 1 tab by mouth three times a day  #90 x 0   Entered and Authorized by:   Denny Levy MD   Signed by:   Denny Levy MD on 08/29/2009   Method used:   Telephoned to ...       [facility]* (retail)       [address]       [place], [place]  [address]       Ph: [phone]       Fax: [phone]   RxID:   [id number]  triage Ok--I have signed for you to call in one month rx. She should not be out until TODAY as she got #90 last month. Thanks  Denny Levy MD  August 29, 2009 2:18 PM I called them in & notified pt.Golden Circle RN  August 29, 2009 2:23 PM
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
## 2010-06-05 NOTE — Progress Notes (Signed)
  Phone Note Call from Patient   Caller: Patient Call For: [phone] Summary of Call: Need to have a back brace Initial call taken by: Abundio Miu,  April 11, 2010 2:50 PM  Follow-up for Phone Call        Why?  Back braces are useful for acute injuries but promote core muscle atrophy when used for chronic pain as Amalia has.  The answer for Lillianne is to keep mobile and active.  I'm considering another course of formal PT.  BTW XR showed no screw loosening, no evidence of hardware failure.  I cannot cure her LBP.   Follow-up by: Rodney Langton MD,  April 11, 2010 9:45 PM  Additional Follow-up for Phone Call Additional follow up Details #1::        pt is calling about results Additional Follow-up by: De Nurse,  April 12, 2010 2:10 PM    Additional Follow-up for Phone Call Additional follow up Details #2::    pt is calling again about results Follow-up by: De Nurse,  April 13, 2010 10:29 AM  Additional Follow-up for Phone Call Additional follow up Details #3:: Details for Additional Follow-up Action Taken: Spoke with pt and informed. should would like to go to PT again Additional Follow-up by: Jimmy Footman, CMA,  April 13, 2010 5:02 PM

## 2010-06-05 NOTE — Miscellaneous (Signed)
  Clinical Lists Changes  Problems: Removed problem of NIGHT SWEATS (ICD-780.8) Removed problem of RISK OF FALLING (ICD-V15.88) Removed problem of STUTTERING (ICD-307.0) Removed problem of ANEMIA, NORMOCYTIC (ICD-285.9)

## 2010-06-05 NOTE — Progress Notes (Signed)
Summary: pls call  Phone Note Call from Patient Call back at Home Phone [phone]   Caller: Patient Summary of Call: needs to talk to nurse about her meds Initial call taken by: De Nurse,  January 23, 2010 4:10 PM  Follow-up for Phone Call        takes metopralol & imitrex. takes imitrex 50mg  daily. states she has a lot of HAs. has noticed that her bp is low when she takes them together. pharmacist told her not to take together. this med was d/c from her list. states she does not take the mobic as she has been told to NOT take NSAIDS.  out of her narcotic pain meds & waiting for a refill. told her we have sent it to pcp & we will let her know when it is filled. asked her to try to not take the imitrex at same time as bp med. told of dangers from falls. she does not drive. spouse if bringing her 02/06/10. offered earlier appt if she can get a ride. wants to know what else she can do. told her best to discuss with md at appt but I will send this to pcp & call her when I have a response Follow-up by: Golden Circle RN,  January 23, 2010 4:14 PM  Additional Follow-up for Phone Call Additional follow up Details #1::        Will discuss meds at next appt, refill waiting at front for her. Additional Follow-up by: Rodney Langton MD,  January 24, 2010 9:02 AM    Additional Follow-up for Phone Call Additional follow up Details #2::    uses [facility] in [place]. asked that the rx be faxed. told her md will discuss other meds at next visit. faxed it in Follow-up by: Golden Circle RN,  January 24, 2010 11:05 AM  Prescriptions: LORTAB 10 10-500 MG TABS (HYDROCODONE-ACETAMINOPHEN) 1 tab by mouth three times a day  #90 x 0   Entered and Authorized by:   Rodney Langton MD   Signed by:   Rodney Langton MD on 01/24/2010   Method used:   Print then Give to Patient   RxID:   [id number]
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
## 2010-06-05 NOTE — Miscellaneous (Signed)
Summary: Procedure Consent  Procedure Consent   Imported By: De Nurse 04/12/2010 16:06:33  _____________________________________________________________________  External Attachment:    Type:   Image     Comment:   External Document
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
## 2010-06-05 NOTE — Progress Notes (Signed)
 Summary: meds prob  Phone Note From Pharmacy Call back at [phone]   Caller: [facility] Initial call taken by: Karna Seminole,  October 24, 2008 11:18 AM Caller: [facility] Summary of Call: foam isn't covered by insurance but the solution is covered - pls advise Initial call taken by: Karna Seminole,  October 24, 2008 11:18 AM  Follow-up for Phone Call        advised pharmacist to give what insurance will pay for Follow-up by: Ginnie Mau RN,  October 24, 2008 11:19 AM  Additional Follow-up for Phone Call Additional follow up Details #1::        May use solution but be careful not to get it on face or other uncovered skin.  Thanks. Additional Follow-up by: Debby Petties MD,  October 24, 2008 1:49 PM      Appended Document: meds prob called & instructed pt on use of the lotion. to avoid the area where she had the biopsy

## 2010-06-05 NOTE — Progress Notes (Signed)
 Summary: Lab Res  Phone Note Call from Patient Call back at Home Phone [phone]   Caller: Patient Summary of Call: pt checking on status of lab results. Initial call taken by: Madelin Daring,  November 18, 2008 2:30 PM  Follow-up for Phone Call        will forward mesage to MD. will forward message to MD. Follow-up by: Avelina Sharps RN,  November 18, 2008 2:53 PM  Additional Follow-up for Phone Call Additional follow up Details #1::        Labs looked ok, electrolytes normal and albumin/protein normal but she did look a little dehydrated.  I want her to stay hydrated with >8 glasses of water a day and I will be rechecking her kidney function when she comes back in to ensure that it has improved. Additional Follow-up by: Debby Petties MD,  November 21, 2008 10:32 AM     Appended Document: Lab Res discussed md response with pt
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
## 2010-06-05 NOTE — Progress Notes (Signed)
Summary: triage  Phone Note Call from Patient Call back at Home Phone [phone]   Caller: Patient Summary of Call: Pt asking to speak to [facility] concerning her medication. Initial call taken by: Clydell Hakim,  July 27, 2009 2:02 PM  Follow-up for Phone Call        states she always has to wait for days to get her pain med refilled.her pharmacy states they can never reach [place]. we have no requests in emr from [place]. told her the rx was written this am. she wants it faxed to [facility] [place] suggested she call the pharmacy (or [place]) 3-4 days before she runs out so we have time to get in touch with md. told her they are not here 5 days a week.  I faxed it & called the pharmacy Follow-up by: Golden Circle RN,  July 27, 2009 2:41 PM  Additional Follow-up for Phone Call Additional follow up Details #1::        Thanks Kennon Rounds. Additional Follow-up by: Rodney Langton MD,  July 27, 2009 7:31 PM

## 2010-06-05 NOTE — Assessment & Plan Note (Signed)
Summary: read tb/eo  Nurse Visit   Allergies: 1)  ! Nsaids 2)  ! Neurontin 3)  ! Ultram  PPD Results    Date of reading: 02/19/2010    Results: 0 mm    Interpretation: negative  Orders Added: 1)  No Charge Patient Arrived (NCPA0) [NCPA0]   patient reports chest tightness and shortness of breath for  at least 3 weeks she states. she did not mention to MD at visit on 02/16/2010. patient does not appear in any acute distress at this time.  offered work in appointment today but she cannot wait today . appointment scheduled tomorrow with Dr. Benjamin Stain for work in appointment. patient wonders could symptoms be due to Chantix. Theresia Lo RN  February 19, 2010 10:22 AM

## 2010-06-05 NOTE — Progress Notes (Signed)
Summary: test results  Phone Note Call from Patient Call back at Home Phone [phone]   Caller: Patient Summary of Call: would like to know results of labs Initial call taken by: De Nurse,  July 07, 2009 2:53 PM  Follow-up for Phone Call        will forward message to MD. Follow-up by: Theresia Lo RN,  July 07, 2009 3:09 PM  Additional Follow-up for Phone Call Additional follow up Details #1::        Lab results completely normal, she still needs a colonoscopy as she had some melena. (referral should be pending for gastroenterology) Additional Follow-up by: Rodney Langton MD,  July 07, 2009 3:30 PM    Additional Follow-up for Phone Call Additional follow up Details #2::    patient notified.. has appointment with GI on 07/13/2009. . Follow-up by: Theresia Lo RN,  July 07, 2009 3:52 PM

## 2010-06-05 NOTE — Assessment & Plan Note (Signed)
Summary: f/up,tcb   Vital Signs:  Patient profile:   50 year old female Weight:      115.8 pounds Temp:     98.7 degrees F oral Pulse rate:   70 / minute Pulse rhythm:   regular BP sitting:   87 / 58  (right arm) Cuff size:   regular  Vitals Entered By: Loralee Pacas CMA (June 21, 2009 2:04 PM)  Serial Vital Signs/Assessments:  Time      Position  BP       Pulse  Resp  Temp     By                     98/50                          Rodney Langton MD   Primary Care Provider:  Rodney Langton MD   History of Present Illness: 77F hx falls with PVCs on holter, here for FU after starting metoprolol.   Doing well, atenolol caused excessive fatigue, switched to metoprolol 25mg  1/2 tab two times a day.  She feels ok with this medication.  her BP is a little low today, better on my recheck, she is asymptomatic with this regimen and hasn't had any further falls.  She also endorses diarrhea, black, tarry for 2 weeks now.  Also has some epigastric pain.  Has never had an EGD.  Recent colonoscopy that was normal.  No weight loss, constipation, hematochezia.  Current Medications (verified): 1)  Lortab 10 10-500 Mg Tabs (Hydrocodone-Acetaminophen) .Marland Kitchen.. 1 Tab By Mouth Three Times A Day 2)  Carisoprodol 350 Mg Tabs (Carisoprodol) .... One Tab Po Three Times A Day As Needed For Back Pain. 3)  Xopenex Hfa 45 Mcg/act Aero (Levalbuterol Tartrate) .... 2 Puffs Q4-6h As Needed For Sob/wheeze 4)  Flovent Hfa 44 Mcg/act Aero (Fluticasone Propionate  Hfa) .... Two Puffs Inhaled Two Times A Day, Even When Not Having Symptoms. 5)  Imitrex 50 Mg Tabs (Sumatriptan Succinate) .... Take One For Headache As Needed, If Not Better May Take Second 2 Hours After. 6)  Topiramate 50 Mg Tabs (Topiramate) .... One Tab By Mouth Tid 7)  Clobetasol Propionate 0.05 % Oint (Clobetasol Propionate) .... Apply To Rash On Knee Daily For 2 Weeks 8)  Trazodone Hcl 100 Mg Tabs (Trazodone Hcl) .... One Tab By Mouth  Qhs As Needed Insomnia 9)  Omeprazole 20 Mg Cpdr (Omeprazole) .Marland Kitchen.. 1 Tablet By Mouth 1/2 Hr Before Your First Meal 10)  Metoprolol Tartrate 25 Mg Tabs (Metoprolol Tartrate) .... One Half Tab By Mouth Bid  Allergies (verified): 1)  ! Nsaids 2)  ! Neurontin 3)  ! Ultram  Review of Systems       See hPI  Physical Exam  General:  Well-developed,well-nourished,in no acute distress; alert,appropriate and cooperative throughout examination Eyes:  No conjunctival pallor. Lungs:  Normal respiratory effort, chest expands symmetrically. Lungs are clear to auscultation, no crackles or wheezes. Heart:  Normal rate and regular rhythm. S1 and S2 normal without gallop, murmur, click, rub or other extra sounds. Abdomen:  Bowel sounds positive,abdomen soft and without masses, organomegaly or hernias noted.  Mildly TTP in epigastrium. Extremities:  Ambulatory.   Impression & Recommendations:  Problem # 1:  MELENA (ICD-578.1) H pylori neg, still needs GI referral and EGD.  Already on PPI, continue this.  Checking the below labs.  Pt to RTC after GI visit.  Orders: Basic  Met-[facility] [phone]) CBC-[facility] (09811) H pylori-[facility] [phone]) Gastroenterology Referral (GI) [facility]- Est  Level 4 (29562)  Problem # 2:  PREMATURE VENTRICULAR CONTRACTIONS (ICD-427.69) Assessment: Improved Better on metoprolol, cont current dose.  Her updated medication list for this problem includes:    Metoprolol Tartrate 25 Mg Tabs (Metoprolol tartrate) ..... One half tab by mouth bid  Orders: [facility]- Est  Level 4 (13086)  Problem # 3:  BACK PAIN, LOW (ICD-724.2) Assessment: Unchanged Pt desires more muscle relaxants however I would like to see her ina formal PT session prior to this.  Her updated medication list for this problem includes:    Lortab 10 10-500 Mg Tabs (Hydrocodone-acetaminophen) .Marland Kitchen... 1 tab by mouth three times a day    Carisoprodol 350 Mg Tabs (Carisoprodol) ..... One tab po three times a day as needed for  back pain.  Orders: Physical Therapy Referral (PT) [facility]- Est  Level 4 (99214)  Complete Medication List: 1)  Lortab 10 10-500 Mg Tabs (Hydrocodone-acetaminophen) .Marland Kitchen.. 1 tab by mouth three times a day 2)  Carisoprodol 350 Mg Tabs (Carisoprodol) .... One tab po three times a day as needed for back pain. 3)  Xopenex Hfa 45 Mcg/act Aero (Levalbuterol tartrate) .... 2 puffs q4-6h as needed for sob/wheeze 4)  Flovent Hfa 44 Mcg/act Aero (Fluticasone propionate  hfa) .... Two puffs inhaled two times a day, even when not having symptoms. 5)  Imitrex 50 Mg Tabs (Sumatriptan succinate) .... Take one for headache as needed, if not better may take second 2 hours after. 6)  Topiramate 50 Mg Tabs (Topiramate) .... One tab by mouth tid 7)  Clobetasol Propionate 0.05 % Oint (Clobetasol propionate) .... Apply to rash on knee daily for 2 weeks 8)  Trazodone Hcl 100 Mg Tabs (Trazodone hcl) .... One tab by mouth qhs as needed insomnia 9)  Omeprazole 20 Mg Cpdr (Omeprazole) .Marland Kitchen.. 1 tablet by mouth 1/2 hr before your first meal 10)  Metoprolol Tartrate 25 Mg Tabs (Metoprolol tartrate) .... One half tab by mouth bid  Patient Instructions: 1)  PT referral 2)  Gastroenterologist referral for black stools. 3)  Cont taking the heart medicine. 4)  Come back to see me after your appt with the GI doctor. 5)  -Dr. Karie Schwalbe.  Laboratory Results   Blood Tests   Date/Time Received: June 21, 2009 2:38 PM  Date/Time Reported: June 21, 2009 3:15 PM    H. pylori: negative Comments: ...........test performed by...........Marland KitchenTerese Door, CMA

## 2010-06-05 NOTE — Progress Notes (Signed)
 Summary: triage  Phone Note Call from Patient   Caller: Patient Summary of Call: about medication she is taking. Initial call taken by: Madelin Daring,  December 06, 2008 1:40 PM  Follow-up for Phone Call        she wanted to know if she had to take another anti diarrhea pill after every loose stool. told her yes. if soft formed, no. if watery, yes. also wanted to know if she had to have food in her stomach when she takes prednisone . told her yes Follow-up by: Ginnie Mau RN,  December 06, 2008 2:04 PM  Additional Follow-up for Phone Call Additional follow up Details #1::        Thanks, noted. Additional Follow-up by: Debby Petties MD,  December 06, 2008 5:36 PM

## 2010-06-05 NOTE — Assessment & Plan Note (Signed)
Summary: Insomnia, anorexia, and chronic pain   Vital Signs:  Patient profile:   50 year old female Height:      64 inches Weight:      120 pounds BMI:     20.67 BSA:     1.58 Temp:     98.8 degrees F Pulse rate:   112 / minute BP sitting:   113 / 75  Vitals Entered By: Jone Baseman CMA (May 23, 2009 8:46 AM) CC: insomnia and anorexia Is Patient Diabetic? No Pain Assessment Patient in pain? no        Primary Care Provider:  Rodney Langton MD  CC:  insomnia and anorexia.  History of Present Illness: 50yo F c/o insomnia and anorexia and cough  Insomnia: States that she is not able to sleep for the past several days.  Dr. Karie Schwalbe has refused to refill her Amitriptyline.  She is currently on Ambien 10mg  at bedtime but states that it is no longer working for her.  Denies caffeine or exercise in the evening.  Wt Loss: States she has lost 9lbs in the past month.  Mainly b/c everything she eats seems to bother her stomach.  Denies any N/V.  Denied hx of GERD.  Denies any hemoptysis or melena or hematochezia.  States she still desires to eat but just cannot b/c of the "sour feeling" in her stomach.  Habits & Providers  Alcohol-Tobacco-Diet     Tobacco Status: quit     Tobacco Counseling: to remain off tobacco products  Allergies: 1)  ! Nsaids 2)  ! Neurontin 3)  ! Ultram  Review of Systems       Denies any N/V.  No hx of GERD.  Denies any hemoptysis or melena or hematochezia.  Physical Exam  General:  VS Reviewed. Non ill appearing.  Anxious but appropriate.  Abdomen:  soft, no true objective tenderness, ND, no HSM, active BS Skin:  no edema underneath the eyes Psych:  Anxious Strange affect and demeanor   Impression & Recommendations:  Problem # 1:  INSOMNIA, CHRONIC (ICD-307.42) Assessment Deteriorated  Ambien no longer working for her.  Amitriptyline removed b/c of falls risk.  Plan to try her on trazodone 50mg  at bedtime for the next 2 weeks.  Will  d/c the Ambien.  She emphasized that she has no SI/HI.  Will f/u with either myself or Dr. Karie Schwalbe in 1-2 weeks.    Orders: [facility]- Est  Level 4 (36644)  Problem # 2:  ANOREXIA (ICD-783.0) Assessment: New Confirmed 6lb wt loss since I last saw her on 05/09/2009.  I have concern that she may have gastritis or excessive acid production given her symptoms.  She has GERD listed in her problem list but denies any hx and is not on any medications.  Will check a H. Pylori today and start her on low dose omeprazole and f/u in 1-2 weeks to reassess.    Orders: H pylori-[facility] (03474) [facility]- Est  Level 4 (99214)  Problem # 3:  BACK PAIN, LOW (ICD-724.2) Assessment: Comment Only  Received a phone call after the encounter requesting a refill on her pain medication (hydrocodone).  We did not discuss her pain during the office visit and looking back, she had a refill (#90) on 04/27/2009 therefore, I don't feel comfortable given out a refill especially since she is also on Soma.  Will have her f/u with Dr. Karie Schwalbe for this issue.  She would be a good candidate for a pain contract after  looking back on her records and based on today's encounter.   Her updated medication list for this problem includes:    Lortab 10 10-500 Mg Tabs (Hydrocodone-acetaminophen) .Marland Kitchen... 1 tab by mouth three times a day    Carisoprodol 350 Mg Tabs (Carisoprodol) ..... One tab po three times a day as needed for back pain.  Orders: [facility]- Est  Level 4 (99214)  Complete Medication List: 1)  Lortab 10 10-500 Mg Tabs (Hydrocodone-acetaminophen) .Marland Kitchen.. 1 tab by mouth three times a day 2)  Carisoprodol 350 Mg Tabs (Carisoprodol) .... One tab po three times a day as needed for back pain. 3)  Xopenex Hfa 45 Mcg/act Aero (Levalbuterol tartrate) .... 2 puffs q4-6h as needed for sob/wheeze 4)  Flovent Hfa 44 Mcg/act Aero (Fluticasone propionate  hfa) .... Two puffs inhaled two times a day, even when not having symptoms. 5)  Promethazine Hcl 25 Mg Tabs  (Promethazine hcl) .Marland Kitchen.. 1 by mouth q8 hrs as needed nausea 6)  Imitrex 50 Mg Tabs (Sumatriptan succinate) .... Take one for headache as needed, if not better may take second 2 hours after. 7)  Topiramate 50 Mg Tabs (Topiramate) .... One tab by mouth tid 8)  Clobetasol Propionate 0.05 % Oint (Clobetasol propionate) .... Apply to rash on knee daily for 2 weeks 9)  Flonase 50 Mcg/act Susp (Fluticasone propionate) .... 2 sprays in each nostril two times a day 10)  Trazodone Hcl 50 Mg Tabs (Trazodone hcl) .Marland Kitchen.. 1 tablet by mouth at bedtime as needed insomnia 11)  Omeprazole 20 Mg Cpdr (Omeprazole) .Marland Kitchen.. 1 tablet by mouth 1/2 hr before your first meal  Patient Instructions: 1)  Follow up with either myself (Dr. Burnadette Pop) in 1-2 weeks or you can wait until 06/07/2009 to see Dr. Karie Schwalbe. 2)  Stop the Ambien. 3)  I started you on trazodone for the insomnia. 4)  I'm testing you to see if you could be producing more acid than usual today and then I want you to start the omeprazole each day. Prescriptions: OMEPRAZOLE 20 MG CPDR (OMEPRAZOLE) 1 tablet by mouth 1/2 hr before your first meal  #30 x 0   Entered and Authorized by:   Marisue Ivan  MD   Signed by:   Marisue Ivan  MD on 05/23/2009   Method used:   Electronically to        [facility]* (retail)       [address]       [place], [place]  [address]       Ph: [phone]       Fax: [phone]   RxID:   [id number] TRAZODONE HCL 50 MG TABS (TRAZODONE HCL) 1 tablet by mouth at bedtime as needed insomnia  #15 x 0   Entered and Authorized by:   Marisue Ivan  MD   Signed by:   Marisue Ivan  MD on 05/23/2009   Method used:   Electronically to        [facility]* (retail)       [address]       [place], [place]  [address]       Ph: [phone]       Fax: [phone]   RxID:   [id number]   Laboratory Results   Blood Tests   Date/Time Received: May 23, 2009 9:11AM  Date/Time Reported: May 23, 2009 9:55  AM    H. pylori: negative Comments: ...............test performed by......Marland KitchenBonnie A. [place], MLS (ASCP)cm  Date/Time Received:

## 2010-06-05 NOTE — Consult Note (Signed)
Summary: [facility]  [facility]   Imported By: Clydell Hakim 05/12/2009 11:27:08  _____________________________________________________________________  External Attachment:    Type:   Image     Comment:   External Document

## 2010-06-05 NOTE — Progress Notes (Signed)
Summary: Triage  Phone Note Call from Patient Call back at [phone]   Reason for Call: Talk to Nurse Summary of Call: pt is requesting to speak with RN, she has poison ivy and its getting in her eyes Initial call taken by: ERIN LEVAN,  October 22, 2006 11:15 AM  Follow-up for Phone Call        pt reports she poison ivy on thighs and face and now since yesterday  is on eyelid. much itching. offered appointment to work in this afternoon but her husband has to go to work. offered appointment tomorrow. she will check with husband's schedule and call back later today Follow-up by: Theresia Lo RN,  October 22, 2006 11:48 AM

## 2010-06-05 NOTE — Assessment & Plan Note (Signed)
Summary: remove holter moniter/ls  Nurse Visit Holter Monitor removed. taken to hospital to EKG workroom. Theresia Lo RN  May 10, 2009 3:33 PM   Allergies: 1)  ! Nsaids 2)  ! Neurontin 3)  ! Ultram  Orders Added: 1)  No Charge Patient Arrived (NCPA0) [NCPA0]
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
## 2010-06-05 NOTE — Progress Notes (Signed)
Summary: Paula Duncan  Phone Note Call from Patient Call back at [facility] Phone [phone]   Caller: Patient Summary of Call: Pt says she was to have a referral to a stomach doctor and has not heart anything as of yet. Initial call taken by: Clydell Hakim,  June 30, 2009 10:47 AM  Follow-up for Phone Call        appointment scheduled and patient notified. Follow-up by: Theresia Lo RN,  June 30, 2009 11:24 AM

## 2010-06-05 NOTE — Progress Notes (Signed)
 Summary: refill  Phone Note Refill Request Call back at Home Phone [phone] Message from:  Patient  Refills Requested: Medication #1:  LORTAB 10 10-500 MG TABS 1 tab by mouth three times a day Initial call taken by: Karna Seminole,  April 26, 2009 4:51 PM  Follow-up for Phone Call        to pcp Follow-up by: Ginnie Mau RN,  April 26, 2009 5:05 PM  Additional Follow-up for Phone Call Additional follow up Details #1::        Placed into fax box. Additional Follow-up by: Debby Petties MD,  April 27, 2009 8:54 AM    Prescriptions: LORTAB 10 10-500 MG TABS (HYDROCODONE -ACETAMINOPHEN ) 1 tab by mouth three times a day  #90 x 0   Entered and Authorized by:   Debby Petties MD   Signed by:   Debby Petties MD on 04/27/2009   Method used:   Historical   RxID:   [id number]

## 2010-06-05 NOTE — Progress Notes (Signed)
 Summary: rx  Phone Note Call from Patient Call back at Home Phone [phone]   Caller: Patient Summary of Call: pt is requesting a small tank of oxygen  - she gives out of breath rapidly upon exertion Initial call taken by: Karna Seminole,  November 23, 2008 11:06 AM  Follow-up for Phone Call        states she gets SOB after sweeping the floor. told her I could make an appt to see if she needed the O2. explained how we check her O2 levels with walking around to see if it drops & how much.  she decided to wait for her appt with pcp 12/19/08. told her to call back for appt if she wants to be seen sooner Follow-up by: Ginnie Mau RN,  November 23, 2008 11:08 AM  Additional Follow-up for Phone Call Additional follow up Details #1::        Excellent and I agree.  She should come in for evaluation if this is a concern for her. Additional Follow-up by: Debby Petties MD,  November 23, 2008 11:15 AM

## 2010-06-05 NOTE — Consult Note (Signed)
 Summary: [facility]   Imported By: Karna Seminole 03/09/2009 09:51:19  _____________________________________________________________________  External Attachment:    Type:   Image     Comment:   External Document

## 2010-06-05 NOTE — Miscellaneous (Signed)
Summary: triage call.  Clinical Lists Changes received phone call from patient staing MD has started her on chantix and megestrol. she has not slept in 2  nights. also is having bloated feeling in abdomen and stated legs are swollen . when ask to describe the degree of swelling she states she can see where her socks go. will forward message to MD. Theresia Lo RN  February 23, 2010 3:25 PM  consulted with Dr. Jennette Kettle and she advises for patient to stop chantix and if symptoms are not improved by Monday she needs to call for appointment to be seen Monday. otherwise she needs to call for follow up appointment with Dr. Benjamin Stain to discuss other options for stopping smoking. Theresia Lo RN  February 23, 2010 3:35 PM

## 2010-06-05 NOTE — Assessment & Plan Note (Signed)
Summary: acute dyspneic episode  Nurse Visit   Vital Signs:  Patient profile:   50 year old female Weight:      126.3 pounds O2 Sat:      99 % on Room air Temp:     98.7 degrees F Pulse rate:   122 / minute BP sitting:   113 / 78  (left arm)  Vitals Entered By: Theresia Lo RN (May 09, 2009 2:12 PM)  O2 Flow:  Room air  Impression & Recommendations:  Problem # 1:  DYSPNEA (ICD-786.05) Assessment Deteriorated Acute dyspneic episode that spontaneously resolved with unk etiology.  She has underlying chronic bronchitis.  No signs of pneumothorax, PE, or other life threatening causes.  Pt stable now.  O2 sat 99% on RA.  Will f/u if symptoms return.  Pt to have holter monitor placed and f/u with Dr. Karie Schwalbe.   Orders: Pulse Oximetry- [facility] (94760) [facility]- Est Level  3 (29924)  Complete Medication List: 1)  Lortab 10 10-500 Mg Tabs (Hydrocodone-acetaminophen) .Marland Kitchen.. 1 tab by mouth three times a day 2)  Ambien 10 Mg Tabs (Zolpidem tartrate) .Marland Kitchen.. 1 tab by mouth at bedtime 3)  Carisoprodol 350 Mg Tabs (Carisoprodol) .... One tab po three times a day as needed for back pain. 4)  Xopenex Hfa 45 Mcg/act Aero (Levalbuterol tartrate) .... 2 puffs q4-6h as needed for sob/wheeze 5)  Flovent Hfa 44 Mcg/act Aero (Fluticasone propionate  hfa) .... Two puffs inhaled two times a day, even when not having symptoms. 6)  Promethazine Hcl 25 Mg Tabs (Promethazine hcl) .Marland Kitchen.. 1 by mouth q8 hrs as needed nausea 7)  Imitrex 50 Mg Tabs (Sumatriptan succinate) .... Take one for headache as needed, if not better may take second 2 hours after. 8)  Topiramate 50 Mg Tabs (Topiramate) .... One tab by mouth tid 9)  Clobetasol Propionate 0.05 % Oint (Clobetasol propionate) .... Apply to rash on knee daily for 2 weeks 10)  Flonase 50 Mcg/act Susp (Fluticasone propionate) .... 2 sprays in each nostril two times a day   Primary Care Provider:  Rodney Langton MD  CC:  reports episode of not being able to breathe today  around 12:00 noon. lasted 6-8 mionutes and felt like couldn't get air in .  History of Present Illness: 50yo F here initially for holter monitor placement that needed to be seen b/c of acute dyspneic episode.  Dyspneic episode: Occurred earlier today lasting 8 minutes.  States that she "could not catch her breath".  She tried the xopenex without much improvment.  No instigating factors or events.  No chest pain associated.  Feels fine now.  Does not smoke...quit July 2010.     Review of Systems       no chest pain or acute syncope episode   Physical Exam  General:  VS reviewed.  Well appearing, NAD Lungs:  Moving air in all lung fields. Mild end expiratory wheezing bilaterally with forced expiration Heart:  RRR  Skin:  normal color  CC: reports episode of not being able to breathe today around 12:00 noon. lasted 6-8 mionutes, felt like couldn't get air in  Is Patient Diabetic? No   Habits & Providers  Alcohol-Tobacco-Diet     Tobacco Status: quit  Allergies: 1)  ! Nsaids 2)  ! Neurontin 3)  ! Ultram  Orders Added: 1)  Pulse Oximetry- [facility] [94760] 2)  [facility]- Est Level  3 [26834]   Vital Signs:  Patient profile:   50 year  old female Weight:      126.3 pounds O2 Sat:      99 % Temp:     98.7 degrees F Pulse rate:   122 / minute BP sitting:   113 / 78  (left arm)  Vitals Entered By: Theresia Lo RN (May 09, 2009 2:12 PM)  O2 Flow:  Room air  Appended Document: urine report    Lab Visit  Laboratory Results   Urine Tests  Date/Time Received: May 09, 2009 2:04 PM  Date/Time Reported: May 09, 2009 2:53 PM   Routine Urinalysis   Color: bright yellow Appearance: Clear Glucose: negative   (Normal Range: Negative) Bilirubin: negative   (Normal Range: Negative) Ketone: negative   (Normal Range: Negative) Spec. Gravity: <1.005   (Normal Range: 1.003-1.035) Blood: small   (Normal Range: Negative) pH: 5.5   (Normal Range: 5.0-8.0) Protein:  negative   (Normal Range: Negative) Urobilinogen: 0.2   (Normal Range: 0-1) Nitrite: negative   (Normal Range: Negative) Leukocyte Esterace: negative   (Normal Range: Negative)  Urine Microscopic WBC/HPF: occ  Bacteria/HPF: 1+ Epithelial/HPF: few    Comments: ...............test performed by......Marland KitchenBonnie A. [place], MLS (ASCP)cm    Orders Today:   Appended Document: acute dyspneic episode  holter monitor applied.  clean catch urine specimen obtained for urinalysis.
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
## 2010-06-05 NOTE — Progress Notes (Signed)
Summary: Phone: CT scan results  Phone Note Call from Patient   Caller: Patient Call For: [phone] Summary of Call: Please call patient results from CT.  Patient is at home now and will be awaiting your call. Initial call taken by: Abundio Miu,  February 20, 2010 2:59 PM  Follow-up for Phone Call        Pls call pt and let her know it looks negative to me, will await radiologist's over-read to make sure. Follow-up by: Rodney Langton MD,  February 20, 2010 3:06 PM  Additional Follow-up for Phone Call Additional follow up Details #1::        Negative per radiologist as well. Additional Follow-up by: Rodney Langton MD,  February 20, 2010 3:51 PM    Additional Follow-up for Phone Call Additional follow up Details #2::    pr called and informed of above note - she was very thankful Follow-up by: De Nurse,  February 21, 2010 4:18 PM

## 2010-06-05 NOTE — Progress Notes (Signed)
 Summary: Triage  Phone Note Call from Patient Call back at Home Phone [phone]   Caller: Patient Summary of Call: pt thinks her biopsy site is getting infected.   Initial call taken by: Madelin Daring,  October 26, 2008 2:21 PM  Follow-up for Phone Call        red, puffy, pus coming out of it. not febrile. wanted appt in am. will be here at 8:30am. advised ibuprofen  with food every 6 hours and use warm wet cloth to area frequently. rest leg if possible. told her if it gets very painful, cannot bend leg or develops a fever, may go to ED. she agrees with plan Follow-up by: Ginnie Mau RN,  October 26, 2008 2:39 PM  Additional Follow-up for Phone Call Additional follow up Details #1::        OK good advice.  She needs to be seen in the office.  Thanks. Additional Follow-up by: Debby Petties MD,  October 26, 2008 3:47 PM

## 2010-06-05 NOTE — Progress Notes (Signed)
 Summary: triage  Phone Note Call from Patient Call back at Home Phone [phone]   Caller: Patient Summary of Call: pt having shortness of breath. Initial call taken by: Madelin Daring,  December 22, 2008 1:52 PM  Follow-up for Phone Call        Patient has been having more episodes of SOB recently.  States it's worse after she goes outside.  No accompaning sx of dizziness or vision changes and denies cardiac sx.  The sx do subside after coming back inside.  Attempted to get her into the clinic this pm for  [place] appt but states she has no way to get here.  Advised her to stay indoors as much as possible and to limit her activity as much as possible.  If she feels the need to go outside (likes to look at her flowers) to do so only in the early am.  Needs to continue using her rescue inhaler as directed and also advised her to stay near the phone in case she needs to call 911.  Pt aware of sx that would warrent urgent care.  Pt was also asking for a refill on her [place], told her that she would have to wait until her appt with Dr. Curtis on 8/26.  She is concerned that her pain may be contributing to her SOB as well.  Told her I would send this note to him. Follow-up by: Nathanel Saba RN,  December 22, 2008 2:11 PM  Additional Follow-up for Phone Call Additional follow up Details #1::        Noted, unlikely that her lack of vicodin/pain is contributing to her SOB.  Will address this at her next appt.   Additional Follow-up by: Debby Curtis MD,  December 22, 2008 9:43 PM

## 2010-06-05 NOTE — Assessment & Plan Note (Signed)
 Summary: Paula Duncan   Vital Signs:  Patient profile:   50 year old female Height:      64 inches Weight:      127.13 pounds O2 Sat:      96 % on Room air Temp:     98.3 degrees F oral Pulse rate:   115 / minute BP sitting:   104 / 67  (left arm)  Vitals Entered By: Arland Morel (January 31, 2009 9:27 AM)  O2 Flow:  Room air CC: F/u PFT's/ wants to change muscle relaxants Is Patient Diabetic? No Pain Assessment Patient in pain? yes     Location: lower back Intensity: 6 Type: compressing   Primary Care Provider:  Debby Petties MD  CC:  F/u PFT's/ wants to change muscle relaxants.  History of Present Illness: 25F for fu  Breathing:  Improved with addition of fluticasone  inhaled, PFTs normal.  Still using combivent q2h, feels like she could back off to q6h.  Itching:  On palms and groin, pruritic, thickening of skin on palms, red and itchy groin.  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  States she has up to 12 episodes a day, does not drive, operate machines, no pools or lakes nearby.  Also would like Methocarbamol changed to soma , has used in the past and worked better for LBP.  Habits & Providers  Alcohol-Tobacco-Diet     Tobacco Status: quit < 6 months  Allergies: 1)  ! Nsaids  Social History: Smoking Status:  quit < 6 months  Review of Systems       See HPI  Physical Exam  General:  Well-developed,well-nourished,in no acute distress; alert,appropriate and cooperative throughout examination Lungs:  Normal respiratory effort, chest expands symmetrically. Lungs are clear to auscultation, no crackles or wheezes. Heart:  Normal rate and regular rhythm. S1 and S2 normal without gallop, murmur, click, rub or other extra sounds. Skin:  Thickened, pruritic skin on palms with cracking.  Reddish, moist, pruritic plaques on groing bilaterally.  Pt feels they are similar.  Fingernails normal.   Impression & Recommendations:  Problem # 1:  TINEA CRURIS  (ICD-110.3) Assessment New Lotrisone two times a day for 2 weeks.  Lamisil by mouth if no improvement.  Orders: [facility]- Est  Level 4 (00785)  Problem # 2:  TINEA MANUUM (ICD-110.2) Assessment: New Lotrisone two times a day for 2 weeks.  Lamisil by mouth if no improvement.  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  Orders: [facility]- Est  Level 4 (00785)  Problem # 4:  COPD (ICD-496) Assessment: Improved Doing well.  Pt to back off combivent to q6h, cont taking flovent .  RTC if worsening of symptoms.  Her updated medication list for this problem includes:    Combivent 103-18 Mcg/act Aero (Ipratropium-albuterol ) .SABRA... 1-2 puffs inhaled 4x/day as needed for sob    Flovent  Hfa 44 Mcg/act Aero (Fluticasone  propionate  hfa) .SABRA..SABRA Two puffs inhaled two times a day, even when not having symptoms.  Problem # 5:  BACK PAIN, LOW (ICD-724.2) Assessment: Unchanged Methocarbamol not working, has been on soma  in the past, prefers this, will change to low dose carisoprodol .  Her updated medication list for this problem includes:    Lortab 10 10-500 Mg Tabs (Hydrocodone -acetaminophen ) .SABRA... 1 tab by mouth three times a day    Soma  250 Mg Tabs (Carisoprodol ) ..... One tab by mouth three times a day as  needed for pain.  Orders: [facility]- Est  Level 4 (99214)  Complete Medication List: 1)  Lortab 10 10-500 Mg Tabs (Hydrocodone -acetaminophen ) .SABRA.. 1 tab by mouth three times a day 2)  Ambien  10 Mg Tabs (Zolpidem  tartrate) .SABRA.. 1 tab by mouth at bedtime 3)  Amitriptyline Hcl 150 Mg Tabs (Amitriptyline hcl) .SABRA.. 1 tab by mouth at bedtime 4)  Soma  250 Mg Tabs (Carisoprodol ) .... One tab by mouth three times a day as needed for pain. 5)  Combivent 103-18 Mcg/act Aero (Ipratropium-albuterol ) .SABRA.. 1-2 puffs inhaled 4x/day as needed for sob 6)  Flovent  Hfa 44 Mcg/act Aero (Fluticasone  propionate  hfa) .... Two puffs inhaled  two times a day, even when not having symptoms. 7)  Lotrisone 1-0.05 % Crea (Clotrimazole-betamethasone ) .... Apply two times a day to hands and groin for 2 weeks.  Other Orders: Pulse Oximetry- [facility] [phone])  Patient Instructions: 1)  Good to see you, glad you are feeling better, 2)  Make sure to get to your Neurologist appt. 3)  You have a fungal infection of your hands and groin. 4)  Apply Lotrisone cream to the areas two times a day x 2 weeks. 5)  Also decrease use of your combivent to every 6 hours. 6)  Come back to see me after your neurologist appt, or earlier if you have any problems. 7)  -Dr. ONEIDA. Prescriptions: LOTRISONE 1-0.05 % CREA (CLOTRIMAZOLE-BETAMETHASONE ) Apply two times a day to hands and groin for 2 weeks.  #1 tube x 0   Entered and Authorized by:   Debby Petties MD   Signed by:   Debby Petties MD on 01/31/2009   Method used:   Electronically to        [facility]* (retail)       [address]       [place], [place]  [address]       Ph: [phone]       Fax: [phone]   RxID:   [id number] SOMA  250 MG TABS (CARISOPRODOL ) One tab by mouth three times a day as needed for pain.  #30 x 6   Entered and Authorized by:   Debby Petties MD   Signed by:   Debby Petties MD on 01/31/2009   Method used:   Electronically to        [facility]* (retail)       [address]       [place], [place]  [address]       Ph: [phone]       Fax: [phone]   RxID:   [id number]   Appended Document: Orders Update    Clinical Lists Changes  Orders: Added new Test order of [facility] [phone]) - Signed      Appended Document: KOH  =  negative    Lab Visit  Laboratory Results  Date/Time Received: January 31, 2009 10:00 AM  Date/Time Reported: January 31, 2009 4:11 PM   Other Tests  Skin KOH: Negative Comments: ...............test performed by......SABRABonnie A. Jordan, [place] (ASCP)   Orders Today:

## 2010-06-05 NOTE — Progress Notes (Signed)
Summary: triage  Phone Note Call from Patient Call back at Home Phone [phone]   Caller: Patient Summary of Call: having really bad leg cramps in right leg Initial call taken by: De Nurse,  February 14, 2010 3:16 PM  Follow-up for Phone Call        started last night. woke her several times last night. she tried strtching to no avail. we have no appts left. offered UC. she decided to see pcp fridat at 8:30a. told her I have heard that putting a bar of soap under the sheet at night helps or drinking tonic water may help. advised seeing md. states she eats bananas often. to use UC if worse, will see her fri Follow-up by: Golden Circle RN,  February 14, 2010 3:46 PM

## 2010-06-05 NOTE — Progress Notes (Signed)
Summary: triage  Phone Note Call from Patient Call back at Home Phone [phone]   Caller: Patient Summary of Call: The heart medicine that Dr. Benjamin Stain put her on has made her so tired that all she does is stay in the bed. Initial call taken by: Clydell Hakim,  June 15, 2009 10:13 AM  Follow-up for Phone Call        since she started the atenolol, she stays in bed all the time. very fatigued. message to pcp will call pt back Follow-up by: Golden Circle RN,  June 15, 2009 10:35 AM  Additional Follow-up for Phone Call Additional follow up Details #1::        Ok, change to metoprolol 12.5mg  by mouth two times a day.  Stop atenolol. Additional Follow-up by: Rodney Langton MD,  June 15, 2009 11:02 AM    Additional Follow-up for Phone Call Additional follow up Details #2::    called & informed pt. told her it may take 2-3 days before the fatigue is gone. she will go get the new med. expained how to take. told her to call if any problems with that one. she agreed with plan Follow-up by: Golden Circle RN,  June 15, 2009 11:05 AM  New/Updated Medications: METOPROLOL TARTRATE 25 MG TABS (METOPROLOL TARTRATE) One half tab by mouth BID Prescriptions: METOPROLOL TARTRATE 25 MG TABS (METOPROLOL TARTRATE) One half tab by mouth BID  #30 x 0   Entered and Authorized by:   Rodney Langton MD   Signed by:   Rodney Langton MD on 06/15/2009   Method used:   Electronically to        [facility]* (retail)       [address]       [place], [place]  [address]       Ph: [phone]       Fax: [phone]   RxID:   [id number]
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
## 2010-06-05 NOTE — Progress Notes (Signed)
Summary: triage  Phone Note Call from Patient Call back at Home Phone [phone]   Caller: Patient Summary of Call: feels like someone is holding her heart - heavy - made appt for tomorrow am unless nurse feels she needs to come today Initial call taken by: De Nurse,  Sep 28, 2009 12:29 PM  Follow-up for Phone Call        started 1 week ago. feels like somebody is punching her in her chest &her heart is being "held". denies pressure or crushing sensation. denies sob,sweatiness,back pain,neck  , jaw or shoulder pain. states her heart is "fluttering off & on.  had a halter moniter to diagnose her. does not have a cardiologist  refused to come in today due to no transportation. has appt in am. reviewed the red flags twice & told her if any occur or she feels worse call 911 & go to ED. she agreed Follow-up by: Golden Circle RN,  Sep 28, 2009 1:59 PM  Additional Follow-up for Phone Call Additional follow up Details #1::        Noted. Additional Follow-up by: Rodney Langton MD,  Sep 28, 2009 10:30 PM

## 2010-06-05 NOTE — Letter (Signed)
Summary: Generic Letter  [facility]  [address]   [place], [place] [address]   Phone: [phone]  Fax: [phone]    10/04/2009  Paula Duncan [address] [place], [place]  [address]  Dear Ms. MARCOM,  This letter is to inform you that your appointment for the ETT testing is June 24th at 1130 am.  Please read the enclosed information sheet and follow the direction before the test or they will not be able to do the procedure.  If you have any questions please call the ofice at [phone].   Sincerely,   Gladstone Pih

## 2010-06-05 NOTE — Assessment & Plan Note (Signed)
Summary: F/U eo   Vital Signs:  Patient profile:   50 year old female Weight:      104 pounds Temp:     98.6 degrees F oral Pulse rate:   108 / minute Pulse rhythm:   regular BP sitting:   134 / 77  (left arm) Cuff size:   regular  Vitals Entered By: Loralee Pacas CMA (March 08, 2010 9:41 AM) CC: follow-up visit Comments pt states she is experiencing bad stomach cramping and ear pain. she stopped the chantix bc it made her bloated   Primary Care Provider:  Rodney Langton MD  CC:  follow-up visit.  History of Present Illness: 50 yo female comes in with multiple complaints.  Night sweats:  They continue, her cancer and wt loss workup has been negative thusfar with neg PAP, mammo, CT chest, colonoscopy, PPD, normal prealbumin, normal CMET.  She is 50 years old but it is uncertain whether she has had a TAHBSO or not.  She claims this was done in the 1990's however an [place] from 2002 shows uterus and ovaries still in place.  Weight loss:  Improved with megace, has gained 10 lbs.  Still has appt scheduled with Dr. Gerilyn Pilgrim.  Abd pain:  C/o small lump on abdomen under laparotomy scar.  Painful.  Back pain:  Amenable to discuss this at next visit.  Smoking: Does not desire pharmacologic assistance.  Admits she only smokes when her BF smokes, she knows the risks.  Ear pain: For a couple of days now, coughing up clear sputum, pain is just below ears, she can feel some small nodes that are tender.  Worried that this will go into her chest.  Habits & Providers  Alcohol-Tobacco-Diet     Alcohol drinks/day: 0     Tobacco Status: current     Tobacco Counseling: to quit use of tobacco products     Cigarette Packs/Day: 1.0     Year Started: 1975     Year Quit: july 2010     Pack years: 17.50  Exercise-Depression-Behavior     Have you felt down or hopeless? no     Have you felt little pleasure in things? no     Depression Counseling: not indicated; screening negative for  depression     Seat Belt Use: always  Current Medications (verified): 1)  Lortab 10 10-500 Mg Tabs (Hydrocodone-Acetaminophen) .Marland Kitchen.. 1 Tab By Mouth Three Times A Day 2)  Tizanidine Hcl 4 Mg Tabs (Tizanidine Hcl) .... One Tab By Mouth Q6h As Needed For Muscle Spasm. 3)  Trazodone Hcl 100 Mg Tabs (Trazodone Hcl) .... One Tab By Mouth Qhs As Needed Insomnia 4)  Metoprolol Tartrate 25 Mg Tabs (Metoprolol Tartrate) .... One Half Tab By Mouth Bid 5)  Ambien 10 Mg Tabs (Zolpidem Tartrate) .... One Tab By Mouth Qhs As Needed For Insomnia 6)  Megestrol Acetate 800 Mg/29ml Susp (Megestrol Acetate) .... 20ml By Mouth Daily 7)  Chantix 1 Mg Tabs (Varenicline Tartrate) .... 1/2 Tab By Mouth Daily X 3d, Then 1/2 Tab By Mouth Two Times A Day X4d, Then 1 Tab By Mouth Two Times A Day.  Take For 11 Weeks.  Stop Smoking After The First Week. 8)  Tessalon 200 Mg Caps (Benzonatate) .... One Tab By Mouth Three Times A Day As Needed For Cough  Allergies (verified): 1)  ! Nsaids 2)  ! Neurontin 3)  ! Ultram  Social History: Risk analyst Use:  always  Review of Systems  See HPI  Physical Exam  General:  Well-developed,well-nourished,in no acute distress; alert,appropriate and cooperative throughout examination Ears:  External ear exam shows no significant lesions or deformities.  Otoscopic examination reveals clear canals, tympanic membranes are intact bilaterally without bulging, retraction, inflammation or discharge. Hearing is grossly normal bilaterally. Neck:  No deformities, masses, or tenderness noted. Lungs:  Normal respiratory effort, chest expands symmetrically. Lungs are clear to auscultation, no crackles or wheezes. Heart:  Normal rate and regular rhythm. S1 and S2 normal without gallop, murmur, click, rub or other extra sounds. Abdomen:  Small 1cm well defined, moveable mass present at midline approx 3-4 cm inferior to umbilicus.  Tender to palpation, not really prominent to valsalva.      Impression & Recommendations:  Problem # 1:  ? of VENTRAL HERNIA (ICD-553.20) Assessment New Will check CT abd/pelvis with by mouth contrast to eval hernia.  Orders: [facility]- Est  Level 4 (60454) CT with Contrast (CT w/ contrast)  Problem # 2:  WEIGHT LOSS, ABNORMAL (ICD-783.21) Assessment: Improved Resolved, she is now gaining weight and has a negative cancer workup. Neg PAP, mammo, CT chest, colonoscopy, PPD, normal prealbumin, normal CMET.  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 Awaiting fu with Dr. Gerilyn Pilgrim.  Orders: [facility]- Est  Level 4 (09811)  Problem # 4:  TOBACCO DEPENDENCE (ICD-305.1) Assessment: Improved She was intolerant of chantix and does not wish to pursue medical treatment of nicotine use. She claims she only smokes when around boyfriend who also smokes. She will let us know when she is ready for medical help with cessation.  The following medications were removed from the medication list:    Chantix 1 Mg Tabs (Varenicline tartrate) .Marland Kitchen... 1/2 tab by mouth daily x 3d, then 1/2 tab by mouth two times a day x4d, then 1 tab by mouth two times a day.  take for 11 weeks.  stop smoking after the first week.  Problem # 5:  BACK PAIN, LOW (ICD-724.2) Assessment: Deteriorated Will discuss at next visit.  Her updated medication list for this problem includes:    Lortab 10 10-500 Mg Tabs (Hydrocodone-acetaminophen) .Marland Kitchen... 1 tab by mouth three times a day    Tizanidine Hcl 4 Mg Tabs (Tizanidine hcl) ..... One tab by mouth q6h as needed for muscle spasm.  Problem # 6:  NIGHT SWEATS (ICD-780.8) Assessment: New I cannot find any documentation of her having had a hysterectomy and pt is not sure either, she states it was done in our system.   Her symptoms and age are suggestive of perimenopausal syndrome. Will see on the CT scan for her  hernia if her gynecological organs are present.  Complete Medication List: 1)  Lortab 10 10-500 Mg Tabs (Hydrocodone-acetaminophen) .Marland Kitchen.. 1 tab by mouth three times a day 2)  Tizanidine Hcl 4 Mg Tabs (Tizanidine hcl) .... One tab by mouth q6h as needed for muscle spasm. 3)  Trazodone Hcl 100 Mg Tabs (Trazodone hcl) .... One tab by mouth qhs as needed insomnia 4)  Metoprolol Tartrate 25 Mg Tabs (Metoprolol tartrate) .... One half tab by mouth bid 5)  Ambien 10 Mg Tabs (Zolpidem tartrate) .... One tab by mouth qhs as needed for insomnia 6)  Megestrol Acetate 800 Mg/58ml Susp (Megestrol acetate) .... 20ml by mouth daily 7)  Tessalon 200 Mg Caps (Benzonatate) .... One tab by mouth three times  a day as needed for cough  Patient Instructions: 1)  CT scan of your belly looking for the hernia, 2)  You have a cold. 3)  Continue the megace. 4)  Don't miss your appt with Dr. Gerilyn Pilgrim. 5)  Come back to see me as needed. 6)  -Dr. Karie Schwalbe.   Orders Added: 1)  [facility]- Est  Level 4 [[address]] 2)  CT with Contrast [CT w/ contrast]
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
## 2010-06-05 NOTE — Progress Notes (Signed)
Summary: triage  Phone Note Call from Patient   Caller: Patient Summary of Call: Pt wants to know why she had a heart monitor before. Initial call taken by: Clydell Hakim,  May 16, 2009 1:48 PM  Follow-up for Phone Call        to pcp. please call her Follow-up by: Golden Circle RN,  May 16, 2009 1:51 PM  Additional Follow-up for Phone Call Additional follow up Details #1::        [facility], discussed her care, discussed why I will not refill amitryptiline, she understands.  Told her I would call her when I had the results of the holter test.  Pt agrees to wait.  Will also bring in her old records at her next visit with me. Additional Follow-up by: Rodney Langton MD,  May 16, 2009 3:17 PM

## 2010-06-05 NOTE — Progress Notes (Signed)
 Summary: meds  Phone Note Call from Patient Call back at Home Phone [phone]   Caller: Patient Summary of Call: pt is wanting an inhaler- like the one she had in hosp.  gives out of breath very quickly. [facility]  also would like nicotine  patches called in. Initial call taken by: Karna Seminole,  November 29, 2008 8:48 AM  Follow-up for Phone Call        Combivent is what she was on, albuterol  and ipratropium.  She can pick it up. Follow-up by: Debby Petties MD,  November 29, 2008 10:01 AM    New/Updated Medications: COMBIVENT 103-18 MCG/ACT AERO (IPRATROPIUM-ALBUTEROL ) 1-2 puffs inhaled 4x/day as needed for SOB Prescriptions: COMBIVENT 103-18 MCG/ACT AERO (IPRATROPIUM-ALBUTEROL ) 1-2 puffs inhaled 4x/day as needed for SOB  #1 MDI x 12   Entered and Authorized by:   Debby Petties MD   Signed by:   Debby Petties MD on 11/29/2008   Method used:   Electronically to        [facility]* (retail)       [address]       [place], [place]  [address]       Ph: [phone]       Fax: [phone]   RxID:   [id number]   Appended Document: meds pt notified

## 2010-06-05 NOTE — Progress Notes (Signed)
 Summary: triage  Phone Note Call from Patient Call back at Home Phone [phone]   Caller: Patient Summary of Call: rx that was sent in for her today needs to be 350 because ins will only pay for this. Also does she put it on her open cut. Initial call taken by: Madelin Daring,  January 31, 2009 11:05 AM  Follow-up for Phone Call        to pcp for clarification Follow-up by: Ginnie Mau RN,  January 31, 2009 11:08 AM  Additional Follow-up for Phone Call Additional follow up Details #1::        Will change Soma  to 350,  Also avoid open cut with Lotrisone cream. Additional Follow-up by: Debby Petties MD,  January 31, 2009 11:31 AM    New/Updated Medications: CARISOPRODOL  350 MG TABS (CARISOPRODOL ) One tab Po three times a day as needed for back pain. Prescriptions: CARISOPRODOL  350 MG TABS (CARISOPRODOL ) One tab Po three times a day as needed for back pain.  #30 x 6   Entered and Authorized by:   Debby Petties MD   Signed by:   Debby Petties MD on 01/31/2009   Method used:   Electronically to        [facility] (retail)       [address]       [place], [place]  [address]       Ph: [phone]       Fax: [phone]   RxID:   [id number]   Appended Document: triage pt informed about above

## 2010-06-05 NOTE — Miscellaneous (Signed)
 Summary: refill request  Clinical Lists [facility] [phone] called asking for refill on her hydrocodone . to pcp.Raejean Mau RN  March 28, 2009 10:21 AM  Medications: Rx of LORTAB 10 10-500 MG TABS (HYDROCODONE -ACETAMINOPHEN ) 1 tab by mouth three times a day;  #90 x 0;  Signed;  Entered by: Debby Petties MD;  Authorized by: Debby Petties MD;  Method used: Handwritten    Prescriptions: LORTAB 10 10-500 MG TABS (HYDROCODONE -ACETAMINOPHEN ) 1 tab by mouth three times a day  #90 x 0   Entered and Authorized by:   Debby Petties MD   Signed by:   Debby Petties MD on 03/28/2009   Method used:   Handwritten   RxID:   [id number]   Appended Document: refill request rx called to pharmacy.
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
## 2010-06-06 ENCOUNTER — Telehealth: Payer: Self-pay | Admitting: *Deleted

## 2010-06-07 NOTE — Letter (Signed)
Summary: Generic Letter  [facility]  [address]   [place], [place] [address]   Phone: [phone]  Fax: [phone]    04/17/2010  SIGNE TACKITT [address] [place], [place]  [address]  To whom it may concern,  Paula Duncan is a patient of mine and has had spinal fusion surgery in 2004 and has metal hardware in her lumbar spine.  Feel free to contact me with questions.     Sincerely,   Rodney Langton MD

## 2010-06-07 NOTE — Progress Notes (Signed)
Summary: Letter  Phone Note Call from Patient Call back at Home Phone [phone]   Reason for Call: Talk to Nurse Summary of Call: pt requesting letter from Dr. Karie Schwalbe stating she had a spinal fusion in 2004 and has metal in her back, pt traveling by plane & will need this.  Initial call taken by: Knox Royalty,  April 17, 2010 3:51 PM  Follow-up for Phone Call        Letter at front. Follow-up by: Rodney Langton MD,  April 17, 2010 5:10 PM  Additional Follow-up for Phone Call Additional follow up Details #1::        called pt and will mail letter to her house Additional Follow-up by: Jimmy Footman, CMA,  April 17, 2010 5:20 PM

## 2010-06-07 NOTE — Progress Notes (Signed)
Summary: Triage  Phone Note Call from Patient Call back at [phone]   Reason for Call: Talk to Nurse Summary of Call: pt c/o dizziness/chest pain, doesn't want to go to ER bc they dont know her history Initial call taken by: Knox Royalty,  May 03, 2010 1:45 PM  Follow-up for Phone Call        Patient is out of the state and expereincing periods of chest pain and dizziness.  Did not want to go to the ED in that town until she spoke with [place] first.  Told her that her PCP was on vacation so I would have to confer with another MD and would call her back.  Advised her to go to the ED if signs worsened before I could call her back. Follow-up by: Dennison Nancy RN,  May 03, 2010 2:00 PM  Additional Follow-up for Phone Call Additional follow up Details #1::        Called her back and explained that there was really nothing we could do long distance and urgerd her to do to local ED if signs continue/worsen.  Offered to fax her records to them if that does happen.  Only other advise was to cut her vsist short  and to call us when she gets back into town and we would see her.  Pt agreeable. Additional Follow-up by: Dennison Nancy RN,  May 03, 2010 2:09 PM

## 2010-06-07 NOTE — Progress Notes (Signed)
Summary: Rx  Phone Note Refill Request Call back at [phone]   Refills Requested: Medication #1:  OXYCODONE-ACETAMINOPHEN 5-500 MG CAPS One tab by mouth three times a day as needed for pain. Will have boyfriend,Greg pickup when ready.  Call patient as soon as written.  Initial call taken by: Abundio Miu,  May 08, 2010 9:37 AM  Follow-up for Phone Call        pt calling again, advised pt Rx would be ready by 1/5 when its due, we will call her when ready for p/u Follow-up by: Knox Royalty,  May 08, 2010 4:21 PM  Additional Follow-up for Phone Call Additional follow up Details #1::        Ms. Schnepp would prefer rx for the hydrocodone to be faxed to pharmcy.  She is in [place] and will not be back until late February.  She is requesting the fax to be sent today so that the pharmacy can mail t he rx to her.  Will request the oxycodone when she get back. Additional Follow-up by: Abundio Miu,  May 09, 2010 10:23 AM    Additional Follow-up for Phone Call Additional follow up Details #2::    Rx waiting in envelope at the front. Follow-up by: Rodney Langton MD,  May 10, 2010 2:19 PM  Additional Follow-up for Phone Call Additional follow up Details #3:: Details for Additional Follow-up Action Taken: Patient notified. Additional Follow-up by: Dennison Nancy RN,  May 10, 2010 2:54 PM  Prescriptions: OXYCODONE-ACETAMINOPHEN 5-500 MG CAPS (OXYCODONE-ACETAMINOPHEN) One tab by mouth three times a day as needed for pain.  #90 x 0   Entered and Authorized by:   Rodney Langton MD   Signed by:   Rodney Langton MD on 05/10/2010   Method used:   Print then Give to Patient   RxID:   [id number]

## 2010-06-13 NOTE — Progress Notes (Signed)
Summary: RX  Phone Note Refill Request Call back at [phone]   Refills Requested: Medication #1:  OXYCODONE-ACETAMINOPHEN 5-500 MG CAPS One tab by mouth three times a day as needed for pain. Initial call taken by: Knox Royalty,  June 05, 2010 10:02 AM  Follow-up for Phone Call        Done, at front. Follow-up by: Rodney Langton MD,  June 06, 2010 9:07 AM    Prescriptions: OXYCODONE-ACETAMINOPHEN 5-500 MG CAPS (OXYCODONE-ACETAMINOPHEN) One tab by mouth three times a day as needed for pain.  #90 x 0   Entered and Authorized by:   Rodney Langton MD   Signed by:   Rodney Langton MD on 06/06/2010   Method used:   Print then Give to Patient   RxID:   [id number]

## 2010-06-13 NOTE — Progress Notes (Signed)
  Phone Note Outgoing Call   Call placed by: Jimmy Footman, CMA,  June 06, 2010 11:16 AM Summary of Call: LVM for pt toinform of rx up front ready to be picked up

## 2010-07-04 ENCOUNTER — Telehealth: Payer: Self-pay | Admitting: Sports Medicine

## 2010-07-04 DIAGNOSIS — M545 Low back pain, unspecified: Secondary | ICD-10-CM

## 2010-07-04 MED ORDER — OXYCODONE-ACETAMINOPHEN 5-500 MG PO CAPS: 1.0000 | ORAL_CAPSULE | Freq: Three times a day (TID) | ORAL | Status: DC | PRN

## 2010-07-04 NOTE — Telephone Encounter (Signed)
Requesting refill on oxycodone 

## 2010-07-04 NOTE — Telephone Encounter (Signed)
Refilled oxy/APAP.  #90, refills q monthly. Script at front in envelope.

## 2010-08-08 ENCOUNTER — Telehealth: Payer: Self-pay | Admitting: Sports Medicine

## 2010-08-08 DIAGNOSIS — M545 Low back pain, unspecified: Secondary | ICD-10-CM

## 2010-08-08 NOTE — Telephone Encounter (Signed)
Needs refill on Oxycodone °Please call when ready °

## 2010-08-09 MED ORDER — OXYCODONE-ACETAMINOPHEN 5-500 MG PO CAPS: 1.0000 | ORAL_CAPSULE | Freq: Three times a day (TID) | ORAL | Status: DC | PRN

## 2010-08-09 NOTE — Telephone Encounter (Signed)
Addended by: Rodney Langton on: 08/09/2010 01:50 PM   Modules accepted: Orders

## 2010-08-09 NOTE — Telephone Encounter (Signed)
Spoke with Dr.Thekkekandam and he will come over to do Rx early afternoon. Advised patient she can pick up this afternoon around 3:30.Marland Kitchen

## 2010-08-09 NOTE — Telephone Encounter (Signed)
Needs before weekend - pls advise

## 2010-08-13 LAB — AFB CULTURE WITH SMEAR (NOT AT ARMC)

## 2010-08-13 LAB — GLUCOSE, CAPILLARY
Glucose-Capillary: 104 mg/dL — ABNORMAL HIGH (ref 70–99)
Glucose-Capillary: 107 mg/dL — ABNORMAL HIGH (ref 70–99)
Glucose-Capillary: 107 mg/dL — ABNORMAL HIGH (ref 70–99)
Glucose-Capillary: 109 mg/dL — ABNORMAL HIGH (ref 70–99)
Glucose-Capillary: 117 mg/dL — ABNORMAL HIGH (ref 70–99)
Glucose-Capillary: 118 mg/dL — ABNORMAL HIGH (ref 70–99)
Glucose-Capillary: 118 mg/dL — ABNORMAL HIGH (ref 70–99)
Glucose-Capillary: 121 mg/dL — ABNORMAL HIGH (ref 70–99)
Glucose-Capillary: 124 mg/dL — ABNORMAL HIGH (ref 70–99)
Glucose-Capillary: 125 mg/dL — ABNORMAL HIGH (ref 70–99)
Glucose-Capillary: 125 mg/dL — ABNORMAL HIGH (ref 70–99)
Glucose-Capillary: 128 mg/dL — ABNORMAL HIGH (ref 70–99)
Glucose-Capillary: 129 mg/dL — ABNORMAL HIGH (ref 70–99)
Glucose-Capillary: 129 mg/dL — ABNORMAL HIGH (ref 70–99)
Glucose-Capillary: 130 mg/dL — ABNORMAL HIGH (ref 70–99)
Glucose-Capillary: 136 mg/dL — ABNORMAL HIGH (ref 70–99)
Glucose-Capillary: 146 mg/dL — ABNORMAL HIGH (ref 70–99)
Glucose-Capillary: 170 mg/dL — ABNORMAL HIGH (ref 70–99)
Glucose-Capillary: 193 mg/dL — ABNORMAL HIGH (ref 70–99)
Glucose-Capillary: 80 mg/dL (ref 70–99)
Glucose-Capillary: 88 mg/dL (ref 70–99)
Glucose-Capillary: 92 mg/dL (ref 70–99)
Glucose-Capillary: 92 mg/dL (ref 70–99)
Glucose-Capillary: 96 mg/dL (ref 70–99)
Glucose-Capillary: 99 mg/dL (ref 70–99)

## 2010-08-13 LAB — COMPREHENSIVE METABOLIC PANEL
ALT: 25 U/L (ref 0–35)
ALT: 47 U/L — ABNORMAL HIGH (ref 0–35)
AST: 50 U/L — ABNORMAL HIGH (ref 0–37)
Albumin: 2.2 g/dL — ABNORMAL LOW (ref 3.5–5.2)
Alkaline Phosphatase: 109 U/L (ref 39–117)
BUN: 21 mg/dL (ref 6–23)
BUN: 22 mg/dL (ref 6–23)
CO2: 23 mEq/L (ref 19–32)
CO2: 30 mEq/L (ref 19–32)
Calcium: 8 mg/dL — ABNORMAL LOW (ref 8.4–10.5)
Chloride: 104 mEq/L (ref 96–112)
Creatinine, Ser: 1.39 mg/dL — ABNORMAL HIGH (ref 0.4–1.2)
GFR calc Af Amer: >60 mL/min (ref >60)
GFR calc Af Amer: >60 mL/min (ref >60)
GFR calc non Af Amer: 40 mL/min — ABNORMAL LOW (ref >60)
GFR calc non Af Amer: >60 mL/min (ref >60)
Glucose, Bld: 94 mg/dL (ref 70–99)
Sodium: 144 mEq/L (ref 135–145)
Total Bilirubin: 0.4 mg/dL (ref 0.3–1.2)
Total Bilirubin: 0.6 mg/dL (ref 0.3–1.2)
Total Protein: 5.2 g/dL — ABNORMAL LOW (ref 6.0–8.3)

## 2010-08-13 LAB — BLOOD GAS, ARTERIAL
Bicarbonate: 19 mEq/L — ABNORMAL LOW (ref 20.0–24.0)
Drawn by: 277361
FIO2: 1 %
O2 Saturation: 96.1 %
pCO2 arterial: 27 mmHg — ABNORMAL LOW (ref 35.0–45.0)
pH, Arterial: 7.463 — ABNORMAL HIGH (ref 7.350–7.400)
pO2, Arterial: 77.5 mmHg — ABNORMAL LOW (ref 80.0–100.0)

## 2010-08-13 LAB — POCT I-STAT 3, ART BLOOD GAS (G3+)
Acid-Base Excess: 14 mmol/L — ABNORMAL HIGH (ref 0.0–2.0)
Acid-Base Excess: 5 mmol/L — ABNORMAL HIGH (ref 0.0–2.0)
Acid-base deficit: 1 mmol/L (ref 0.0–2.0)
Acid-base deficit: 4 mmol/L — ABNORMAL HIGH (ref 0.0–2.0)
Bicarbonate: 22.4 mEq/L (ref 20.0–24.0)
Bicarbonate: 23.1 mEq/L (ref 20.0–24.0)
Bicarbonate: 30.8 mEq/L — ABNORMAL HIGH (ref 20.0–24.0)
O2 Saturation: 100 %
O2 Saturation: 91 %
O2 Saturation: 99 %
O2 Saturation: 99 %
Patient temperature: 36
Patient temperature: 37
Patient temperature: 37.5
Patient temperature: 98
TCO2: 19 mmol/L (ref 0–100)
TCO2: 24 mmol/L (ref 0–100)
TCO2: 32 mmol/L (ref 0–100)
TCO2: 39 mmol/L (ref 0–100)
pCO2 arterial: 39 mmHg (ref 35.0–45.0)
pH, Arterial: 7.357 (ref 7.350–7.400)
pH, Arterial: 7.363 (ref 7.350–7.400)
pH, Arterial: 7.37 (ref 7.350–7.400)
pO2, Arterial: 140 mmHg — ABNORMAL HIGH (ref 80.0–100.0)
pO2, Arterial: 63 mmHg — ABNORMAL LOW (ref 80.0–100.0)

## 2010-08-13 LAB — BASIC METABOLIC PANEL
BUN: 11 mg/dL (ref 6–23)
BUN: 14 mg/dL (ref 6–23)
BUN: 20 mg/dL (ref 6–23)
BUN: 23 mg/dL (ref 6–23)
BUN: 29 mg/dL — ABNORMAL HIGH (ref 6–23)
CO2: 19 mEq/L (ref 19–32)
CO2: 22 mEq/L (ref 19–32)
CO2: 25 mEq/L (ref 19–32)
CO2: 35 mEq/L — ABNORMAL HIGH (ref 19–32)
CO2: 36 mEq/L — ABNORMAL HIGH (ref 19–32)
Calcium: 7.9 mg/dL — ABNORMAL LOW (ref 8.4–10.5)
Calcium: 8.2 mg/dL — ABNORMAL LOW (ref 8.4–10.5)
Calcium: 8.2 mg/dL — ABNORMAL LOW (ref 8.4–10.5)
Calcium: 8.3 mg/dL — ABNORMAL LOW (ref 8.4–10.5)
Chloride: 103 mEq/L (ref 96–112)
Chloride: 106 mEq/L (ref 96–112)
Chloride: 92 mEq/L — ABNORMAL LOW (ref 96–112)
Chloride: 98 mEq/L (ref 96–112)
Creatinine, Ser: 0.65 mg/dL (ref 0.4–1.2)
Creatinine, Ser: 0.67 mg/dL (ref 0.4–1.2)
Creatinine, Ser: 0.67 mg/dL (ref 0.4–1.2)
Creatinine, Ser: 0.72 mg/dL (ref 0.4–1.2)
Creatinine, Ser: 0.78 mg/dL (ref 0.4–1.2)
Creatinine, Ser: 0.79 mg/dL (ref 0.4–1.2)
GFR calc Af Amer: >60 mL/min (ref >60)
GFR calc Af Amer: >60 mL/min (ref >60)
GFR calc Af Amer: >60 mL/min (ref >60)
GFR calc non Af Amer: >60 mL/min (ref >60)
GFR calc non Af Amer: >60 mL/min (ref >60)
GFR calc non Af Amer: >60 mL/min (ref >60)
GFR calc non Af Amer: >60 mL/min (ref >60)
Glucose, Bld: 112 mg/dL — ABNORMAL HIGH (ref 70–99)
Glucose, Bld: 126 mg/dL — ABNORMAL HIGH (ref 70–99)
Glucose, Bld: 145 mg/dL — ABNORMAL HIGH (ref 70–99)
Glucose, Bld: 145 mg/dL — ABNORMAL HIGH (ref 70–99)
Glucose, Bld: 91 mg/dL (ref 70–99)
Glucose, Bld: 93 mg/dL (ref 70–99)
Potassium: 3.4 mEq/L — ABNORMAL LOW (ref 3.5–5.1)
Potassium: 4 mEq/L (ref 3.5–5.1)
Potassium: 4.1 mEq/L (ref 3.5–5.1)
Sodium: 137 mEq/L (ref 135–145)
Sodium: 138 mEq/L (ref 135–145)
Sodium: 146 mEq/L — ABNORMAL HIGH (ref 135–145)

## 2010-08-13 LAB — CBC
HCT: 24.7 % — ABNORMAL LOW (ref 36.0–46.0)
HCT: 26.1 % — ABNORMAL LOW (ref 36.0–46.0)
HCT: 26.2 % — ABNORMAL LOW (ref 36.0–46.0)
HCT: 26.3 % — ABNORMAL LOW (ref 36.0–46.0)
HCT: 27.5 % — ABNORMAL LOW (ref 36.0–46.0)
HCT: 28.7 % — ABNORMAL LOW (ref 36.0–46.0)
HCT: 30.5 % — ABNORMAL LOW (ref 36.0–46.0)
Hemoglobin: 10.1 g/dL — ABNORMAL LOW (ref 12.0–15.0)
Hemoglobin: 9.1 g/dL — ABNORMAL LOW (ref 12.0–15.0)
Hemoglobin: 9.1 g/dL — ABNORMAL LOW (ref 12.0–15.0)
MCHC: 33.9 g/dL (ref 30.0–36.0)
MCHC: 34 g/dL (ref 30.0–36.0)
MCHC: 34.2 g/dL (ref 30.0–36.0)
MCHC: 34.4 g/dL (ref 30.0–36.0)
MCHC: 34.6 g/dL (ref 30.0–36.0)
MCHC: 34.6 g/dL (ref 30.0–36.0)
MCHC: 35.1 g/dL (ref 30.0–36.0)
MCHC: 35.3 g/dL (ref 30.0–36.0)
MCV: 91.6 fL (ref 78.0–100.0)
MCV: 92.7 fL (ref 78.0–100.0)
MCV: 92.9 fL (ref 78.0–100.0)
MCV: 93.1 fL (ref 78.0–100.0)
MCV: 93.4 fL (ref 78.0–100.0)
MCV: 93.6 fL (ref 78.0–100.0)
MCV: 93.9 fL (ref 78.0–100.0)
MCV: 94.2 fL (ref 78.0–100.0)
Platelets: 198 10*3/uL (ref 150–400)
Platelets: 236 10*3/uL (ref 150–400)
Platelets: 342 10*3/uL (ref 150–400)
Platelets: 347 10*3/uL (ref 150–400)
Platelets: 355 10*3/uL (ref 150–400)
Platelets: 383 10*3/uL (ref 150–400)
Platelets: 405 10*3/uL — ABNORMAL HIGH (ref 150–400)
RBC: 2.66 MIL/uL — ABNORMAL LOW (ref 3.87–5.11)
RBC: 2.8 MIL/uL — ABNORMAL LOW (ref 3.87–5.11)
RBC: 2.81 MIL/uL — ABNORMAL LOW (ref 3.87–5.11)
RBC: 3.14 MIL/uL — ABNORMAL LOW (ref 3.87–5.11)
RBC: 3.23 MIL/uL — ABNORMAL LOW (ref 3.87–5.11)
RDW: 14.3 % (ref 11.5–15.5)
RDW: 14.4 % (ref 11.5–15.5)
RDW: 14.4 % (ref 11.5–15.5)
RDW: 14.8 % (ref 11.5–15.5)
RDW: 14.9 % (ref 11.5–15.5)
RDW: 15 % (ref 11.5–15.5)
RDW: 15.2 % (ref 11.5–15.5)
RDW: 15.2 % (ref 11.5–15.5)
RDW: 15.4 % (ref 11.5–15.5)
WBC: 13.6 10*3/uL — ABNORMAL HIGH (ref 4.0–10.5)
WBC: 13.6 10*3/uL — ABNORMAL HIGH (ref 4.0–10.5)
WBC: 14.5 10*3/uL — ABNORMAL HIGH (ref 4.0–10.5)
WBC: 17 10*3/uL — ABNORMAL HIGH (ref 4.0–10.5)
WBC: 17.3 10*3/uL — ABNORMAL HIGH (ref 4.0–10.5)
WBC: 18.7 10*3/uL — ABNORMAL HIGH (ref 4.0–10.5)

## 2010-08-13 LAB — HEMOGLOBIN A1C
Hgb A1c MFr Bld: 5 % (ref 4.6–6.1)
Mean Plasma Glucose: 97 mg/dL

## 2010-08-13 LAB — SEDIMENTATION RATE
Sed Rate: 106 mm/hr — ABNORMAL HIGH (ref 0–22)
Sed Rate: 47 mm/hr — ABNORMAL HIGH (ref 0–22)

## 2010-08-13 LAB — BODY FLUID CELL COUNT WITH DIFFERENTIAL
Lymphs, Fluid: 3 %
Total Nucleated Cell Count, Fluid: 400 cu mm (ref 0–1000)

## 2010-08-13 LAB — VANCOMYCIN, TROUGH: Vancomycin Tr: 18.1 ug/mL (ref 10.0–20.0)

## 2010-08-13 LAB — RAPID URINE DRUG SCREEN, HOSP PERFORMED
Amphetamines: NOT DETECTED
Barbiturates: NOT DETECTED
Opiates: POSITIVE — AB

## 2010-08-13 LAB — CULTURE, BLOOD (ROUTINE X 2): Culture: NO GROWTH

## 2010-08-13 LAB — DIFFERENTIAL
Basophils Absolute: 0 10*3/uL (ref 0.0–0.1)
Basophils Relative: 0 % (ref 0–1)
Basophils Relative: 0 % (ref 0–1)
Eosinophils Absolute: 0 10*3/uL (ref 0.0–0.7)
Eosinophils Absolute: 0 10*3/uL (ref 0.0–0.7)
Eosinophils Relative: 0 % (ref 0–5)
Lymphocytes Relative: 3 % — ABNORMAL LOW (ref 12–46)
Lymphs Abs: 0.4 10*3/uL — ABNORMAL LOW (ref 0.7–4.0)
Lymphs Abs: 0.8 10*3/uL (ref 0.7–4.0)
Monocytes Relative: 4 % (ref 3–12)
Neutro Abs: 13.2 10*3/uL — ABNORMAL HIGH (ref 1.7–7.7)
Neutrophils Relative %: 91 % — ABNORMAL HIGH (ref 43–77)
Neutrophils Relative %: 91 % — ABNORMAL HIGH (ref 43–77)
Neutrophils Relative %: 95 % — ABNORMAL HIGH (ref 43–77)

## 2010-08-13 LAB — FUNGUS CULTURE W SMEAR: Fungal Smear: NONE SEEN

## 2010-08-13 LAB — MISCELLANEOUS TEST

## 2010-08-13 LAB — RHEUMATOID FACTOR: Rhuematoid fact SerPl-aCnc: <20 IU/mL (ref 0–20)

## 2010-08-13 LAB — CARBOXYHEMOGLOBIN
Methemoglobin: 0.9 % (ref 0.0–1.5)
O2 Saturation: 69.7 %
Total hemoglobin: 8.3 g/dL — ABNORMAL LOW (ref 12.5–16.0)

## 2010-08-13 LAB — URINALYSIS, MICROSCOPIC ONLY
Nitrite: NEGATIVE
Specific Gravity, Urine: 1.031 — ABNORMAL HIGH (ref 1.005–1.030)
Urobilinogen, UA: 0.2 mg/dL (ref 0.0–1.0)

## 2010-08-13 LAB — CHLAMYDIA ANTIBODIES, IGG
Chlamydia Pneumoniae IgM: <1:10 {titer}
Chlamydia Psittaci IgG: <1:64 {titer}
Chlamydia psittaci Ab IgM: <1:10 {titer}

## 2010-08-13 LAB — COLD AGGLUTININ TITER: Cold Agglutinin Titer: <1:32 {titer}

## 2010-08-13 LAB — MAGNESIUM
Magnesium: 2.2 mg/dL (ref 1.5–2.5)
Magnesium: 2.5 mg/dL (ref 1.5–2.5)

## 2010-08-13 LAB — PHOSPHORUS: Phosphorus: 3.4 mg/dL (ref 2.3–4.6)

## 2010-08-13 LAB — PROTIME-INR
INR: 1.2 (ref 0.00–1.49)
Prothrombin Time: 15.5 seconds — ABNORMAL HIGH (ref 11.6–15.2)

## 2010-08-13 LAB — MRSA PCR SCREENING: MRSA by PCR: NEGATIVE

## 2010-08-13 LAB — CULTURE, BAL-QUANTITATIVE W GRAM STAIN: Colony Count: 3000

## 2010-08-13 LAB — URINE CULTURE: Colony Count: 80000

## 2010-08-13 LAB — CK: Total CK: 306 U/L — ABNORMAL HIGH (ref 7–177)

## 2010-08-13 LAB — HIV ANTIBODY (ROUTINE TESTING W REFLEX): HIV: NONREACTIVE

## 2010-08-13 LAB — C4 COMPLEMENT: Complement C4, Body Fluid: 20 mg/dL (ref 16–47)

## 2010-08-13 LAB — CULTURE, RESPIRATORY W GRAM STAIN: Culture: NO GROWTH

## 2010-08-13 LAB — APTT: aPTT: 37 seconds (ref 24–37)

## 2010-08-13 LAB — LEGIONELLA ANTIGEN, URINE

## 2010-08-13 LAB — CLOSTRIDIUM DIFFICILE EIA

## 2010-08-13 LAB — C-REACTIVE PROTEIN: CRP: 20.1 mg/dL — ABNORMAL HIGH (ref <0.6)

## 2010-08-13 LAB — HEPATITIS PANEL, ACUTE: Hep A IgM: NEGATIVE

## 2010-08-21 ENCOUNTER — Encounter: Payer: Self-pay | Admitting: Sports Medicine

## 2010-08-21 ENCOUNTER — Ambulatory Visit (INDEPENDENT_AMBULATORY_CARE_PROVIDER_SITE_OTHER): Payer: Medicare Other | Admitting: Sports Medicine

## 2010-08-21 VITALS — BP 100/66 | Ht 64.0 in | Wt 93.8 lb

## 2010-08-21 DIAGNOSIS — R413 Other amnesia: Secondary | ICD-10-CM | POA: Insufficient documentation

## 2010-08-21 NOTE — Assessment & Plan Note (Addendum)
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 MMSE 30/30 today. No apparent secondary gain. Has been to neurology, unlikely seizure disorder.   CT head neg in the recent past. Holter and ECHOcardiogram unremarkable. Cardiac Stress test negative last year. Referral to psychiatry.

## 2010-08-21 NOTE — Progress Notes (Signed)
  Subjective:    Patient ID: Paula Duncan, female    DOB: 08/17/1960, 50 y.o.   MRN: [id number]  HPI Paula Duncan comes in with vague complaints of memory loss and inability to remember me (thought she calls me by my name, pronouncing it correctly), her children, other family members.  She notes episodes where she "blacks out," falls, and doesn't remember where she is.  She has described these episodes in the past to me and endorsed she remained conscious during the episodes. These episodes have been present for years without overt psychological trauma preceeding. No prodromal symptoms such as aura, HA, palpitations, dizziness, CP, SOB.  She has been seen by neurology and evaluted for seizures, negative.  CT head neg recently.  2D ECHO and holter negative recently.  She is not a drinker and doesn't use any illicit drugs.  Boyfriend is in room and doesn't note any drastic personality changes.  She is significantly affected by these episodes and tends to stay inside because of them.  Review of Systems   See HPI Objective:   Physical Exam  Constitutional: She appears well-developed and well-nourished. No distress.  Skin: Skin is warm and dry.  Psychiatric: She has a normal mood and affect. Her behavior is normal.   MMSE 30/30       Assessment & Plan:

## 2010-08-21 NOTE — Patient Instructions (Signed)
Great to see you, I would like you to see a psychiatrist regarding your current symptoms. The numbers to call are: [facility]: [phone] or [phone]. They will set you up with an appointment. Be sure they send me a report of what they think.  Ihor Austin. Benjamin Stain, M.D.   Transient Global Amnesia Your exam shows you may have a rare problem that causes temporary amnesia, an inability to remember what has happened in the past several hours or day. Transient global amnesia (TGA) means you cannot remember recent events, even though you may look and act quite normally. There are no physical problems in TGA; your vision, strength, coordination, and sensations are all normal. TGA occurs most often in older patients, and in patients with high blood pressure. The exact cause of TGA is not known, although it is thought to be due to vascular disease in your brain. There is usually a complete return to normal memory capacity after an episode is over. About 20-30% of patients with TGA will have more than one episode, and some studies show a slight increased risk for stroke. Although no special treatment is needed, taking up to one adult aspirin daily reduces the risk of having a stroke. You should consider taking aspirin daily if you are not allergic to it. Medical evaluation may require specialized scans to check for stroke or other brain problems, an EEG (brain wave test), or blood tests. Avoid alcohol or any sedating medicines until you are completely recovered. Call your doctor right away if your memory is not fully recovered after 24 hours, or if you have any other serious problems including:  Severe headache, nausea, vomiting, fever, or other symptoms of an infection.   Weakness, numbness, difficulty with movement, or incoordination.   Blurred or double vision, unusual sleepiness, seizures, or fainting.  Document Released: 05/30/2004 Document Re-Released: 07/17/2009 [facility] Patient  Information 2011 [place], [place].  Dissociative Identity Disorder (DID) Dissociative Identity Disorder is a condition in which a person has at least two or more separate identities. The identities are known as:  Alters.   Personality states.  Each alter is like another individual who seems to take over the person with DID. This is referred to as "splitting". The switch from one alter to the next usually occurs suddenly and without warning. Usually the switch happens spontaneously and is likely triggered by objects or events in the present but are related to objects or events from the past. The alters seem to work independently of each other. Each has its own unique way of relating, thinking and remembering life. Although each alter is aware of its own life, it lacks awareness of the other personality states. This separation of certain memories or thoughts is called "dissociation". Dissociation hides memories or thoughts from normal awareness. As a result, the DID person does not have a complete picture of his or her own life. Instead the disjointed segments cause significant interference in the person's general functioning, including:  Social relationships.   Employment.  People attempting to relate to someone with DID often feel frustrated and confused by the illogical shifts in thoughts and moods resulting from the switching of alters. In the past this disorder was referred to as Multiple Personality Disorder (MPD). Dissociation Dissociation is a mechanism that allows the mind to separate certain memories or thoughts from a persons normal awareness. The split off mental content is not forgotten or erased. It is simply hidden. It may resurface at any time if triggered. The degree  of dissociation occurs along a spectrum from mild to severe.   Everyone has experienced mild episodes of dissociation. Two common examples would be daydreaming or "getting lost" in a book. Another occurs when you take a long  car trip and for periods of time you lose tack of driving. Possibly you "come to" when the car ahead begins to brake. You draw a blank when trying to recall those lost periods. This is mild dissociation. People with DID experience moderate to severe forms of dissociation. The dissociation serve as an internal protective barrier against overwhelming pain or terror often resulting from severe physical or sexual abuse, especially abuse suffered during childhood. People with DID are generally quite bright and creative. In order to survive severe trauma and to carry on in life, their ingenious minds develop protective barriers to help them escape or "split off" from:  The overwhelming fear and pain of ongoing trauma.   The troubling memories of past trauma.  These protective barriers take the form of altered states. With time these altered states develop personalities with separate:  Names.   Gestures.    Temperaments.   Vocabularies.    This is severe dissociation. SIGNS AND SYMPTOMS Individuals diagnosed with DID can demonstrate a variety of symptoms with wide swings across time. Functioning can vary from severe impairment in daily life to normal or high abilities.  Multiple mannerisms, attitudes and beliefs that are not similar. For instance, one might see the person being boisterous and proud one moment and shy and withdrawn the next.   Headaches and other body pains. Frequent switching of alters can result in headaches, fatigue, and body aches. Also, if a given alter is careless or is injured while in the lead, the person may later find unexplained cuts and bruises on his or her body.   Loss of time. The person is unable to account for large amounts of time, ranging from hours to days.   Depersonalization. The person does not have a clear sense of self or have a personal identity.   Memory loss. Each alter has separate and distinct memories that are not shared with each other. An alter can  not recall an event if he or she was present our at the time of the event.   Depression. Experiences low mood and feelings of hopelessness when life is so confusing.  Individuals with DID experience an extremely broad array of symptoms that can resemble:    Epilepsy.   Schizophrenia.    Anxiety disorders.   Mood disorders.     Post traumatic stress disorders.   Personality disorder.     Eating disorder.     For this reason it is not uncommon for individuals with DID to be misdiagnosed with one or more of the above problems before accurately being identified with DID. CAUSES The causes of DID are not clearly defined but have been linked to:  Overwhelming stress.   Traumatic experiences.   Limited childhood nurturing.   A high percentage of individuals with the disorder report childhood abuse.  TREATMENT  Psychotherapy: It is the treatment of choice for individuals suffering from DID. Approaches can vary widely but generally the talk therapy focuses on the individual rather than on families, groups or couples. The goal is to bring together all the altered states into one coordinated whole.   Medication: The use of medication is not generally recommended. Maintenance and effective use of prescriptions given the different personality states is difficult to attain. If prescribed  it has to be very carefully monitored.   Self Help: There is a growing trend for people with the disorder to come together to form mutual self help groups within larger communities and virtually, through online communities.   Support groups: Groups such as the [employer] ([employer]) provides support, education and advocacy for those affected by mental illness. This includes both patients and families.  Document Released: 01/30/2008   [facility] Patient Information 2011 [place], [place].

## 2010-08-22 ENCOUNTER — Telehealth: Payer: Self-pay | Admitting: Sports Medicine

## 2010-08-22 NOTE — Telephone Encounter (Signed)
Pt calling to let MD know Dr. Alanda Amass prescribed her pravastatin.

## 2010-08-23 ENCOUNTER — Telehealth: Payer: Self-pay | Admitting: Sports Medicine

## 2010-08-23 MED ORDER — PRAVASTATIN SODIUM 20 MG PO TABS: 20.0000 mg | ORAL_TABLET | Freq: Every evening | ORAL | Status: DC

## 2010-08-23 NOTE — Telephone Encounter (Signed)
Pt asking to speak with MD re: a medication she has been taking, trazadone. Pt thinks it is related to some of the problems she has been having, memory loss & weight loss.

## 2010-08-23 NOTE — Telephone Encounter (Signed)
Noted  

## 2010-08-23 NOTE — Telephone Encounter (Signed)
Should be only using trazodone qHS, may stop if she would like and see if symptoms improve.

## 2010-08-24 NOTE — Telephone Encounter (Signed)
Informed pt of what Dr. Benjamin Stain suggested. Pt agreed.Paula Duncan

## 2010-08-27 ENCOUNTER — Telehealth: Payer: Self-pay | Admitting: Sports Medicine

## 2010-08-27 NOTE — Telephone Encounter (Signed)
Pt checking status of Rx she requested at last visit for norgesic forte?

## 2010-08-28 NOTE — Telephone Encounter (Signed)
WIll address this with her at the appt or the LBP.  Has she called and set up a psychiatry visit yet?

## 2010-08-28 NOTE — Telephone Encounter (Signed)
No, I told her during the office visit that she would need another office visit to discuss her back pain and find the appropriate option rather than blindly rx'ing Norgesic forte.  The last office visit was for a psychiatric complaint, she will need to dedicated office visit to dicuss LBP.

## 2010-08-28 NOTE — Telephone Encounter (Signed)
Pt called back, was informed of Dr. Melvia Heaps response. Pt wants to know if he is willing to refill her med, tizanidine? Pt goes to [facility].

## 2010-09-03 ENCOUNTER — Telehealth: Payer: Self-pay | Admitting: Sports Medicine

## 2010-09-03 DIAGNOSIS — M545 Low back pain, unspecified: Secondary | ICD-10-CM

## 2010-09-03 NOTE — Telephone Encounter (Signed)
Need refill on Oxycodone.  Call when ready to pick up

## 2010-09-05 MED ORDER — OXYCODONE-ACETAMINOPHEN 5-500 MG PO CAPS: 1.0000 | ORAL_CAPSULE | Freq: Three times a day (TID) | ORAL | Status: DC | PRN

## 2010-09-05 NOTE — Telephone Encounter (Signed)
Pt scheduled appt with pcp next week

## 2010-09-05 NOTE — Telephone Encounter (Signed)
In envelope at front. 

## 2010-09-11 ENCOUNTER — Encounter: Payer: Self-pay | Admitting: Sports Medicine

## 2010-09-11 ENCOUNTER — Ambulatory Visit (INDEPENDENT_AMBULATORY_CARE_PROVIDER_SITE_OTHER): Payer: Medicare Other | Admitting: Sports Medicine

## 2010-09-11 DIAGNOSIS — M545 Low back pain, unspecified: Secondary | ICD-10-CM

## 2010-09-11 DIAGNOSIS — R413 Other amnesia: Secondary | ICD-10-CM

## 2010-09-11 MED ORDER — ORPHENADRINE-ASPIRIN-CAFFEINE 25-385-30 MG PO TABS: 1.0000 | ORAL_TABLET | Freq: Four times a day (QID) | ORAL | Status: DC | PRN

## 2010-09-11 NOTE — Progress Notes (Signed)
  Subjective:    Patient ID: Paula Duncan, female    DOB: May 29, 1960, 50 y.o.   MRN: [id number]  HPI Pt comes in for f/u of her LBP.  She has had discectomy and fusion in the past.  Has been on oxycodone/apap with some benefit.  She took a family member's Norgesic forte and felt like this helped a lot and would like to try this.  No LE numbness/weakness.  No saddle anesthesia, no bowel/bladder problems.  Psych issues:  She was having symptoms suggestive of dissociative amnesia, she was referred to psychiatry at the last visit but never called them.  She has decided she does not want to see a psychiatrist.  No SI/HI.   Review of Systems    See HPI Objective:   Physical Exam  Constitutional: She appears well-developed and well-nourished.  Skin: Skin is warm and dry.          Assessment & Plan:

## 2010-09-11 NOTE — Assessment & Plan Note (Signed)
Pt is refusing to see psychiatrist. She is aware this is outside of my area of expertise. She would like to deal with this on her own. Advised she can see a psychiatrist at any time if she changes her mind.

## 2010-09-11 NOTE — Assessment & Plan Note (Signed)
No signs CES, Cord compression. Will try 1 month course Norgesic Forte. Pt to stop Zanaflex and Trazodone. She may use oxycodone as needed for breakthrough. Pt aware of the risk of falling but would like to proceed.
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
## 2010-09-18 NOTE — Assessment & Plan Note (Signed)
This is a 50 year old female who lives in [place], [place]. She has been  followed by Dr. Cassie Freer for chronic pain management. She has a long  history of back pain. She had a wide posterior laminectomy and fusion  using instrumentation at the L4-5 level. She has had some chronic  postoperative pain, but some increasing pain over the last several  months. She has had follow up MRIs showing some disc protrusion at the  L5-S1 level. Her complaint is mainly back pain, more so than lower  extremity pain. She had a very temporary relief i.e. a couple of hours  with sacral ileac injection on the right side performed on 10/23. She  has had pain that is rated at the 7 to 8 out of 10 level. Sleep is fair.  She wakes up at night about four hours after she takes her Ambien  because of back pain. She can climb steps. She does not drive. She is  independent with her activities of daily living. She has some trouble  walking and spasms in her back, as well as trouble walking due to some  back pain.   CURRENT MEDICATIONS:  1. Hydrocodone fast 5/500 mg t.i.d.  2. Soma 350 mg b.i.d.  3. Amitriptyline 150 mg nightly.  4. Ambien 10 mg nightly.   VITAL SIGNS:  Blood pressure 150/88, respiratory rate 18, O2 saturation  100% on room air.  GENERAL:  No acute distress. Mood and affect appropriate. Gait is  normal.  BACK:  No tenderness to palpation. She has good spine forward flexion  and extension.   She has good lower extremity strength and normal internal external  rotation at the hips. Normal knee and ankle range of motion. No evidence  of effusion. Normal deep tendon reflexes and strength.   IMPRESSION:  Lumbar post-laminectomy syndrome. She has axial back pain,  not clearly SI or facet. She may be degenerating her L5-S1 or her L3-4  disc. Rather than just escalating her medication, I have asked her to  return to her surgeon to see if any progression in herdisk pathology  above or below the level of  instrumentation may be occurring. I will see  her back in a couple of months. We will continue with the same medicines  that she is on right now.      Erick Colace, M.D.  Electronically Signed     AEK/MedQ  D:  03/19/2007 12:46:26  T:  03/20/2007 00:21:10  Job #:  [id number]

## 2010-09-18 NOTE — Procedures (Signed)
Paula Duncan, Paula Duncan                 ACCOUNT NO.:  [id number]   MEDICAL RECORD NO.:  [id number]          PATIENT TYPE:  REC   LOCATION:  TPC                          FACILITY:  [facility]   PHYSICIAN:  Erick Colace, M.D.DATE OF BIRTH:  1960-11-21   DATE OF PROCEDURE:  07/20/2007  DATE OF DISCHARGE:                               OPERATIVE REPORT   OPERATION/PROCEDURE:  Bilateral L3-4 transforaminal lumbar epidural  steroid injection under fluoroscopic guidance.   INDICATIONS:  Status post lumbar fusion with lumbar radiculopathy.  The  patient has had 1-2 days improvement of pain after L5-S1 injection as  per spine surgery.  Will compare results to L3-4 level to see if longer  duration effect is noted.  The patient's pain is only partially  responsive to medication management including narcotic analgesic  medications.   DESCRIPTION OF PROCEDURE:  Informed consent was obtained after  describing risks and benefits to the patient.  These include bleeding,  bruising, infection, loss of bladder function, temporary and/or  permanent paralysis. She elects to proceed and has written consent.   The patient was placed prone on fluoroscopy table.  Betadine prep,  sterile drape. A 25-gauge 1-1/2-inch needle was used to incise skin and  subcu tissue.  1% lidocaine x2 and 0.5 mL at each site and a 22-gauge 3-  1/2-inch spinal needle was inserted into the left L3-4 intervertebral  foramen under AP lateral and oblique imaging.  Omnipaque 180 x 0.5 mL  demonstrated no intravascular up take with epidural spread and then a  solution containing of 1 mL of 40 mg  per mL of Depo-Medrol and 1.5 mL  of 1% MPF lidocaine were injected.  The same procedure was repeated on  the right side using same needle injectate and technique.  The patient  tolerated procedure well.  Pre and post injection vitals were stable.  Post injection instructions given.  Return in 2 weeks.  Pre injection  pain level 8/10.  Post  injection 0/10.      Erick Colace, M.D.  Electronically Signed     AEK/MEDQ  D:  07/20/2007 10:08:00  T:  07/20/2007 11:55:29  Job:  [id number]

## 2010-09-18 NOTE — Group Therapy Note (Signed)
Paula Duncan is a 50 year old female who lives in [place], [place].  She has been referred by Dr. Rex Kras because he is  taking a sabbatical from [facility].   She is patient with a long history of low back pain, about 5 years or  longer.  She has had a fusion procedure which I do not have the notes  for, but I do have several x-rays and MRIs, and this appears to be a  wide posterior laminectomy and fusion using instrumentation L4-L5 level.  She has had postoperative pain, really, since surgery.  She feels like  it actually made her worse.  She has had followup MRIs demonstrating L5-  S1 protrusion.  She has had no clear radicular symptoms.  She has had  bilateral lower extremity pain, mainly in the posterior thigh region.  Her average pain is a 7 to 8 out of 10, interferes with activity on a  moderate level.  She can walk 15 minutes at a time.  She does some  housework.  She does not drive, she does climb steps.   REVIEW OF SYSTEMS:  Positive for numbness, spasms, trouble walking, and  shortness of breath.   She is on the following medications:  1. Hydrocodone 5/500 t.i.d.  2. Carisoprodol 350 daily, sometimes takes up to 2 a day.  3. Amitriptyline 150 nightly.  4. Zolpidem 10 mg nightly.   Her blood pressure is 136/79, pulse 104, respirations 18, O2 sats 100%  on room air.  Affect is bright and alert.  Her gait is normal.  Her back has tenderness in the lumbosacral junction at L5, but not  really above that area.  She has no scar or hypersensitivity.  She has a  midline healed surgical scar extending into the lower thoracic area.  She has good hip, knee, ankle range of motion, upper extremity range of  motion is normal.  NECK:  Normal range of motion.  She has normal sensation and upper and lower extremity strength.   IMPRESSION:  1. Lumbar post laminectomy syndrome.  2. Axial back pain several years after fusion.  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  She has not responded to facet injections      or transforaminal lumbar epidural steroid injection, L4 and L5,      which were done on January 30, 2006.  Will do      diagnostic/therapeutic sacroiliac injections first on the right      side.  3. Will check urine drug screen, and depending on results, will likely      continue on current medications.      Erick Colace, M.D.  Electronically Signed     AEK/MedQ  D:  02/16/2007 16:21:36  T:  02/17/2007 10:29:42  Job #:  [id number]   cc:   Rex Kras, MD  [facility] [id number]  [place], [place] [address]
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
## 2010-09-18 NOTE — Assessment & Plan Note (Signed)
Ms. Bibian follows up today to go over EMG results and followup on her  low back pain.  Her EMG was essentially normal study.  We did this  because of lower extremity paresthesias.  There is no evidence of  peripheral neuropathy or compressive neuropathy.  No electrodiagnostic  evidence of radiculopathy; however, sensory radiculopathy would not show  up on EMG/NCV.   She has had no new medical problems in the interval time, but it feels  like her pain continues to increase or that her hydrocodone is not as  effective as it once was.   Her Oswestry disability index performed on January 05, 2008 was 40%.  Her current pain level is 8/10.   PHYSICAL EXAMINATION:  GENERAL:  In no acute distress.  Mood and affect  appropriate.  EXTREMITY:  Her lower extremity strength is 5/5.  Her sensation is  normal in the lower extremities.  She has normal deep tendon reflexes  with facilitations in the bilateral knees and ankles.  Her gait showed  no evidence of toe drag or knee instability.   IMPRESSION:  1. Lumbar post-laminectomy syndrome.  2. Sacroiliac disorder.   PLAN:  We discussed the limited duration of efficacy, opiates and  analgesics over time and certainly she has been on these medications for  over 2 years.  We discussed options in terms of trying to improve pain  control.  These include first increasing dosage to 10 mg t.i.d. of the  hydrocodone, and if this is not helpful after a month, we would actually  try on a drug holiday opiate wean and then retrial another agent versus  restarting the hydrocodone at lower dose.   She is in agreement with this.  We will continue her amitriptyline 150  nightly and Soma 350 b.i.d.  She also takes Ambien 10 mg nightly p.r.n.      Erick Colace, M.D.  Electronically Signed     AEK/MedQ  D:  02/02/2008 13:26:04  T:  02/03/2008 06:08:14  Job #:  [id number]
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
## 2010-09-18 NOTE — Assessment & Plan Note (Signed)
The patient returns today. She has been followed at the clinic while Dr.  Aurelio Jew from [facility] was taking a sabbatical. The  patient is not sure when he will be back. She has 5 year history of back  pain, lumbar instrumentation and fusion at L4-L5 level. She has had  increasing back and lower extremity pain. Recent CT of lumbar spine  showed foraminal stenosis on the left at L3-L4 level, central stenosis  at L3-4 level, annular bulging, ligamentum flavum hypertrophy  encroaching lateral recesses, progressive since August 27, 2005. L4-5 and  L5-S1 fusion appear intact.   She was reevaluated by Dr. Elvina Sidle from [facility] who is contemplating  surgery, but would first like to have epidural steroid injection  performed. This has been discussed with the patient.   CURRENT MEDICATIONS:  1. The patient states that she nerve received Opana ER 10 mg one p.o.      q12 hours. I gave her a prescription for two weeks supply and I do      have a copy of it. I did receive something from her pharmacy      indicating that it was never filled. The patient denies getting      this ever.  2. She is on Lidoderm patch.  3. Amitriptyline 150 nightly.  4. Hydrocodone 5/500 one p.o. t.i.d.   Pain level is current 7-8/10, interferes with activity at a moderate  level. Pain is worse with walking, bending, sitting and standing, relief  from medications is poor to fair. She can walk 5-8 minutes at a time.  She climbs steps. She does not drive. She has numbness in the legs.   PHYSICAL EXAMINATION:  VITAL SIGNS: Blood pressure 124/70, pulse 107.  Is a well-developed, well-nourished female in no acute distress. Affect  is alert.  Gait is normal.  Manual muscle testing reveals 5/5 strength bilateral hip flexion, knee  extension, ankle dorsiflexors. Sensation testing reveals intact L2, L3,  L4, L5, and S1 dermatomes. She has normal range of motion in the lumbar  spine and she has normal range of  motion in the lower extremities at the  hips, knees and ankles. Upper extremity strength and range of motion is  normal. Normal joint stability, normal muscle tone. Deep tendon reflexes  are 2+ bilateral biceps, triceps, brachioradialis, patellar and  Achilles.   IMPRESSION:  Lumbar post laminectomy syndrome appears to have increasing  degeneration L3-L4 level which is above the level of fusion with some  foraminal stenosis and perhaps some lateral recess stenosis. Progressed  radiographically since August 27, 2005. Will do epidural steroid  injection at L3-L4 level.  The patient will followup with Dr. Elvina Sidle from  Neurosurgery as well.      Erick Colace, M.D.  Electronically Signed     AEK/MedQ  D:  06/24/2007 11:25:47  T:  06/24/2007 21:04:23  Job #:  [id number]   cc:   Dorita Fray  Fax: [phone]

## 2010-09-18 NOTE — Assessment & Plan Note (Signed)
Paula Duncan returns today.  I last saw her on August 03, 2007.  She states  that she has tried to get in with Dr. Elvina Sidle, but has not been able to get  in to see him.  She has a history of L4-5 instrumented fusion, has been  treated for chronic pain syndrome following this.  She has had some  increased pain with CT of the lumbar spine showing some foraminal  stenosis centrally at L3-4 level.  She has both lower extremity and back  pain.  Her pain level is 7/10;  with medications, averaging about 8.  However, she remains functional.  She is independent with all her self  care and mobility.  She has some numbness and spasms in her lower  extremities.  Her sleep is fair.  Pain interferes with activity at 7/10,  enjoyment of life at 3/10.   Her blood pressure is 137/76, pulse 108, respirations 24, O2 saturation  98% on room air.   CURRENT MEDICATIONS:  1. Hydrocodone 5/500 one p.o. t.i.d.  2. Ambien 10 nightly.  3. Norco 7.5/325 t.i.d.  4. Soma 350 b.i.d., last filled May 11.   EXAMINATION:  GENERAL:  No acute distress.  Mood and affect appropriate.  BACK:  Mild tenderness to palpation.  LOWER EXTREMITIES:  Have no edema.  She has normal strength in the lower extremities and normal gait.  She  is able to toe walk and heel walk.   IMPRESSION:  Lumbar postlaminectomy syndrome with some L3-4 lumbar  spinal stenosis causing some lower extremity radiculitis.   PLAN:  We will continue current medication including:  1. Hydrocodone 5/500 t.i.d.  2. Soma 350 b.i.d.  3. Ambien CR 12.5 nightly.  4. Amitriptyline 150 nightly.  We will call to make sure they do not      fill this prior to June 11 at her pharmacy.  This current supply      should last her to November 14, 2007.   We will make another referral to Dr. Elvina Sidle to see if we can facilitate.  We will also send to physical therapy for some lumbar stabilization,  lower extremity strengthening.      Erick Colace, M.D.  Electronically  Signed     AEK/MedQ  D:  09/29/2007 15:07:54  T:  09/29/2007 16:13:28  Job #:  [id number]   cc:   Paula Duncan  Fax: [phone]

## 2010-09-18 NOTE — Assessment & Plan Note (Signed)
Ms. Valadez returns today.  I last saw her on July 20, 2007, at that  time performed a bilateral L3-4 transforaminal lumbar epidural steroid  injection under fluoroscopic guidance.  She had no immediate  postoperative complications.  She went home and when she woke up she had  a headache which was severe but not positional.  She was directed to go  to the ER.  She was evaluated and given a Percocet and headache  resolved.  She had not had any Vicodin or any other pain medicine since  the day before.  She has had no recurrence of headache.   In regard to her injection, it lasted for about a day which is similar  to the bilateral L5-S1 transforaminal lumbar epidural steroid injection  performed on June 29, 2007.  Previously she had had sacroiliac  injections which similarly were not helpful.  She is status post L4-5  instrumented fusion and had been followed by Dr. Cassie Freer for several hours  for chronic pain at [facility].  When I initially saw her in the summer and  fall she had mainly axial back pain but she has developed pain radiating  further into the lower extremities below the knee over the last several  months.  Her pain is partially responsive to narcotic analgesics.  She  takes hydrocodone 5/500 t.i.d.  In addition she is taking Soma 350  b.i.d., Ambien 10 mg nightly and amitriptyline 150 mg nightly.  Her last  urine drug screen was consistent with the medications reported.  Her  walking tolerance is 5-10 minutes, after which she has exacerbation of  low back and lower extremity pain.   She had a CT of the lumbar spine recently showing foraminal stenosis at  central L3-4 level.  Had lateral recess stenosis however more on the  left than the right side.   REVIEW OF SYSTEMS:  No bowel or bladder disorder.  No lower extremity  weakness.   PHYSICAL EXAMINATION:  VITAL SIGNS:  Blood pressure 127/60, pulse 118,  respirations 18, O2 sat 97% on room air.  GENERAL:  No acute distress.   Mood and affect appropriate.  BACK:  Has no tenderness to palpation.  EXTREMITIES:  Show no peripheral edema.  She does have psoriatic skin  changes over the knees anteriorly bilaterally; none over the elbows.  She has normal strength of bilateral upper and lower extremities.  Normal range of motion of bilateral upper and lower extremities.  She  has some pain in the back with Faber maneuver and PSIS area, more on the  right than on the left side.  She has normal deep tendon reflexes  bilaterally in upper and lower extremities.   IMPRESSION:  Lumbar post laminectomy syndrome with increasing lower  extremity pain.  She may be developing some stenosis at the level above  or below the fusion.  At this point it is unclear which is the more  symptomatic level.  She will follow up with spine surgery to look into  her surgical options.  I do not think that any other type of injections  that I could think of that may be helpful in eliciting this.  She may be  a candidate for diskography; we do not perform that at this clinic,  however.   I will keep her on the current medications which include:  1. Hydrocodone 5/500 t.i.d.  2. Soma 350 mg b.i.d.  3. Ambien CR 12.5 nightly.  4. Amitriptyline 150 nightly.   I  will see her back in about 2 months.  In the interval time she will  see Dr. Elvina Sidle.  Ask for any dictations to be forwarded to me, I can be  apprised of the situation.      Erick Colace, M.D.  Electronically Signed     AEK/MedQ  D:  08/03/2007 13:48:23  T:  08/03/2007 14:08:21  Job #:  [id number]   cc:   Dorita Fray  Fax: [phone]

## 2010-09-18 NOTE — Procedures (Signed)
[name], Paula Duncan                 ACCOUNT NO.:  [id number]   MEDICAL RECORD NO.:  [id number]          PATIENT TYPE:  REC   LOCATION:  [facility]                          FACILITY:  [facility]   PHYSICIAN:  Erick Colace, M.D.DATE OF BIRTH:  1960/07/06   DATE OF PROCEDURE:  06/29/2007  DATE OF DISCHARGE:                               OPERATIVE REPORT   PROCEDURE:  Bilateral L5-S1 transforaminal lumbar epidural steroid  injection.   INDICATIONS:  Lower extremity pain.  L5-S1 disk herniation, mainly right  side.  History of L4-5 fusion.  Pain is only partially responsive to  medication management including narcotic analgesics and interferes with  household duties and with walking, standing and sitting.   Informed consent was obtained after describing risks and benefits of the  procedure to the patient.  These included bleeding, bruising, infection,  loss of bowel and bladder function and temporary or permanent paralysis.  The patient elects proceed and has given written consent.  The patient  placed prone on fluoroscopy table.  Betadine prep, sterile drape.  A 25-  gauge inch and half needle was used to anesthetize skin and subcutaneous  tissue with 1% lidocaine x2 mL.  Then a 22-gauge 3-1/2 inch spinal  needle was inserted first into the right L5-S1 intervertebral foramen,  AP, lateral and oblique imaging utilized.  Omnipaque 180 under live  fluoro demonstrated no intravascular uptake and showing a good nerve  root as well as epidural spread, followed by injection of a solution  containing 1 mL of 40 mg/mL Depo-Medrol and 1.5 mL of 1% methylparaben-  free lidocaine.  This same procedure was repeated on the left side with  same needle, injectate and technique.  The patient tolerated the  procedure well.  Post injection she had numbness in the left leg.  She  was therefore observed for 30 minutes with improvement and then allowed  to ambulate with a driver driving her home.  She will see me  in 3-4  weeks.  Pre and post injection vitals stable.  Pre injection pain level  9/10, post injection 0/10.      Erick Colace, M.D.  Electronically Signed     AEK/MEDQ  D:  06/29/2007 13:48:35  T:  06/30/2007 08:06:04  Job:  [id number]   cc:   Dr. Remigio Eisenmenger, [place]

## 2010-09-18 NOTE — Assessment & Plan Note (Signed)
Paula Duncan follows up today.  She has lumbar post-laminectomy syndrome  with sacroiliac disorder.  EMG recently done was normal.  No evidence of  peripheral neuropathy.   Oswestry disability index 40% at last consultation.   I did increase her hydrocodone to 10 mg t.i.d.  Her current Oswestry  score is 57.5%.  Overall, she, however, feels better since we increased  her medication.  Pain level is around 6-7/10 compared to 8/10 last  visit.  She can walk 5 minutes at a time.  She can climb steps.   REVIEW OF SYSTEMS:  Positive for weakness, numbness, and spasms.  She  has some pain behind the knees bilaterally.  She has noted some crepitus-  type grinding sensation in her knees, but no pain in the front of her  knees.   CURRENT MEDICATIONS:  1. Hydrocodone 10/325 t.i.d.  2. Amitriptyline 150 mg at bedtime.  3. Soma 350 mg b.i.d.  4. Ambien 10 mg p.r.n., #15 tablets for a month.   PHYSICAL EXAMINATION:  VITAL SIGNS:  Blood pressure 115/61, pulse 94,  respiratory rate is 18, and O2 sat 99% on room air.  GENERAL:  In no acute distress.  Orientation x3.  Affect is alert.  Gait  is normal.  EXTREMITIES:  She is able to toe walk and heel walk.  Extremities have  1+ lower extremity edema.  NEUROLOGIC:  Sensation is normal.  Deep tendon reflexes are normal.  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  She has good knee, ankle,  and hip range of motion.  BACK:  Tenderness to palpation bilateral lumbosacral junctions.  She has  limited range of motion, 50% forward flexion and extension.   IMPRESSION:  1. Lumbar post-laminectomy syndrome with chronic postoperative pain.  2. Sacroiliac disorder.  3. Knee pain.  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      Maybe attributing to her overall pain syndrome.   We will have to see her back in 3 months.  Keep her on the same  medications.  Should her knee pain worsen, consider x-rays.      Erick Colace, M.D.  Electronically Signed     AEK/MedQ  D:  03/15/2008 12:20:19  T:  03/16/2008 00:41:26  Job #:  [id number]

## 2010-09-18 NOTE — Assessment & Plan Note (Signed)
HISTORY:  Paula Duncan returns today.  I last saw her on Sep 29, 2007.  She has a repeat MRI pending.  There is talk of surgical intervention  per her report.   She has had no falls.  She has had no bowel or bladder dysfunction.   CURRENT MEDICATIONS:  1. Hydrocodone 5/500 one p.o. t.i.d.  2. Ambien 12.5 CR at bedtime.  3. Soma 350 b.i.d.   PHYSICAL EXAMINATION:  GENERAL:  In no acute distress.  Mood and affect  appropriate.  EXTREMITIES:  Lower extremities have no edema.  She has normal strength  to lower extremities.  Normal range of motion.  She is able to heel walk  and toe walk.  She has normal deep tendon reflexes and normal sensation.  Of note, her deep tendon reflexes are 0 in the bilateral lower  extremities with facilitation, but she has normal sensation.  BACK:  Nontender throughout the lower lumbar area.   IMPRESSION:  Lumbar postlaminectomy syndrome, L3-L4 lumbar spinal  stenosis causing lower extremity symptomatology, reduced reflexes.   PLAN:  1. We will continue hydrocodone 5/500 t.i.d.  2. Soma 350 b.i.d.  3. Ambien 12.5 CR at bedtime.  4. Amitriptyline 150 at bedtime.  We will not refill prior to November 14, 2007.  She will need to bring in her pill bottles, so we can check      them as she has not done this.   No signs of cauda equina syndrome at the current time.  She will get her  MRI tomorrow and follow up with Dr. Elvina Sidle.      Erick Colace, M.D.  Electronically Signed     AEK/MedQ  D:  11/03/2007 11:52:06  T:  11/04/2007 16:10:96  Job #:  [id number]   cc:   Dorita Fray, MD
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
## 2010-09-27 ENCOUNTER — Other Ambulatory Visit: Payer: Self-pay | Admitting: Sports Medicine

## 2010-09-27 MED ORDER — ORPHENADRINE CITRATE 100 MG PO TB12: 100.0000 mg | ORAL_TABLET | Freq: Two times a day (BID) | ORAL | Status: DC

## 2010-10-03 ENCOUNTER — Telehealth: Payer: Self-pay | Admitting: Sports Medicine

## 2010-10-03 DIAGNOSIS — M545 Low back pain, unspecified: Secondary | ICD-10-CM

## 2010-10-03 NOTE — Telephone Encounter (Signed)
Pt requesting refill for oxycodone and will pick up when ready.  Please contact

## 2010-10-04 ENCOUNTER — Telehealth: Payer: Self-pay | Admitting: Sports Medicine

## 2010-10-04 MED ORDER — OXYCODONE-ACETAMINOPHEN 5-500 MG PO CAPS: 1.0000 | ORAL_CAPSULE | Freq: Three times a day (TID) | ORAL | Status: DC | PRN

## 2010-10-04 NOTE — Telephone Encounter (Signed)
In envelope at front.

## 2010-10-04 NOTE — Telephone Encounter (Signed)
Called and informed patient of below.

## 2010-10-04 NOTE — Telephone Encounter (Signed)
Pt is requesting to increase the Norflex to 3 x times daily instead of just 2.  She says it works much better that way.

## 2010-10-05 MED ORDER — ORPHENADRINE CITRATE 100 MG PO TB12: 100.0000 mg | ORAL_TABLET | Freq: Three times a day (TID) | ORAL | Status: DC

## 2010-10-05 NOTE — Telephone Encounter (Signed)
Just tell her ok and I will refill when she runs out.

## 2010-10-05 NOTE — Telephone Encounter (Signed)
Spoke with patient and informed of below 

## 2010-10-08 ENCOUNTER — Other Ambulatory Visit: Payer: Self-pay | Admitting: Sports Medicine

## 2010-10-08 MED ORDER — ORPHENADRINE CITRATE 100 MG PO TB12: ORAL_TABLET | ORAL | Status: DC

## 2010-10-16 ENCOUNTER — Encounter: Payer: Self-pay | Admitting: Sports Medicine

## 2010-10-31 ENCOUNTER — Other Ambulatory Visit (INDEPENDENT_AMBULATORY_CARE_PROVIDER_SITE_OTHER): Payer: Medicare Other | Admitting: Sports Medicine

## 2010-10-31 DIAGNOSIS — M545 Low back pain, unspecified: Secondary | ICD-10-CM

## 2010-10-31 NOTE — Telephone Encounter (Signed)
Refill request

## 2010-11-02 ENCOUNTER — Telehealth: Payer: Self-pay | Admitting: Sports Medicine

## 2010-11-02 NOTE — Telephone Encounter (Signed)
Needs refill for Oxycodone.

## 2010-11-05 ENCOUNTER — Other Ambulatory Visit: Payer: Self-pay | Admitting: Family Medicine

## 2010-11-05 DIAGNOSIS — M545 Low back pain: Secondary | ICD-10-CM

## 2010-11-05 MED ORDER — OXYCODONE-ACETAMINOPHEN 5-500 MG PO CAPS: 1.0000 | ORAL_CAPSULE | Freq: Three times a day (TID) | ORAL | Status: DC | PRN

## 2010-11-05 NOTE — Telephone Encounter (Signed)
Pt informed of Rx and was asked to make an appt .Marland KitchenLoralee Pacas Blair

## 2010-11-05 NOTE — Telephone Encounter (Signed)
Chronic medication that former PCP prescribed. Will refill once, would like patient to meet me for further refills.

## 2010-11-05 NOTE — Telephone Encounter (Signed)
I left a prescription at the front desk

## 2010-11-21 ENCOUNTER — Other Ambulatory Visit: Payer: Self-pay | Admitting: Family Medicine

## 2010-11-21 NOTE — Telephone Encounter (Signed)
Pt calling about her muscle relaxers, says last rx was only given 60 pills & it only last her 20 days, pt is assigned to [facility] & has an appt with konkol on 7/24. Pt needs this rx before her appt.

## 2010-11-22 MED ORDER — ORPHENADRINE CITRATE ER 100 MG PO TB12: 100.0000 mg | ORAL_TABLET | Freq: Two times a day (BID) | ORAL | Status: DC | PRN

## 2010-11-22 NOTE — Telephone Encounter (Signed)
Pt informed and agreed.Paula Duncan Lynetta  

## 2010-11-22 NOTE — Telephone Encounter (Signed)
Forwarded to [name].Paula Duncan, Paula Duncan

## 2010-11-22 NOTE — Telephone Encounter (Signed)
Sent in another 60 tablets, please cal and tell pt though this should only  Be as needed and only twice a day. Thank you

## 2010-11-23 ENCOUNTER — Other Ambulatory Visit: Payer: Self-pay | Admitting: Family Medicine

## 2010-11-23 MED ORDER — ORPHENADRINE CITRATE ER 100 MG PO TB12: 100.0000 mg | ORAL_TABLET | Freq: Two times a day (BID) | ORAL | Status: DC | PRN

## 2010-11-27 ENCOUNTER — Encounter: Payer: Self-pay | Admitting: Family Medicine

## 2010-11-27 ENCOUNTER — Ambulatory Visit (INDEPENDENT_AMBULATORY_CARE_PROVIDER_SITE_OTHER): Payer: Medicare Other | Admitting: Family Medicine

## 2010-11-27 VITALS — BP 119/80 | HR 96 | Temp 98.4°F | Ht 64.0 in | Wt 90.4 lb

## 2010-11-27 DIAGNOSIS — F17200 Nicotine dependence, unspecified, uncomplicated: Secondary | ICD-10-CM

## 2010-11-27 DIAGNOSIS — E785 Hyperlipidemia, unspecified: Secondary | ICD-10-CM

## 2010-11-27 DIAGNOSIS — R05 Cough: Secondary | ICD-10-CM

## 2010-11-27 DIAGNOSIS — M545 Low back pain: Secondary | ICD-10-CM

## 2010-11-27 DIAGNOSIS — R634 Abnormal weight loss: Secondary | ICD-10-CM

## 2010-11-27 DIAGNOSIS — R630 Anorexia: Secondary | ICD-10-CM

## 2010-11-27 DIAGNOSIS — I493 Ventricular premature depolarization: Secondary | ICD-10-CM

## 2010-11-27 DIAGNOSIS — I4949 Other premature depolarization: Secondary | ICD-10-CM

## 2010-11-27 DIAGNOSIS — G4700 Insomnia, unspecified: Secondary | ICD-10-CM

## 2010-11-27 LAB — CBC
HCT: 46.7 % — ABNORMAL HIGH (ref 36.0–46.0)
MCV: 95.1 fL (ref 78.0–100.0)
Platelets: 346 10*3/uL (ref 150–400)
RBC: 4.91 MIL/uL (ref 3.87–5.11)
WBC: 9.2 10*3/uL (ref 4.0–10.5)

## 2010-11-27 LAB — COMPREHENSIVE METABOLIC PANEL
ALT: 12 U/L (ref 0–35)
Albumin: 4.8 g/dL (ref 3.5–5.2)
CO2: 25 mEq/L (ref 19–32)
Calcium: 10.1 mg/dL (ref 8.4–10.5)
Chloride: 102 mEq/L (ref 96–112)
Creat: 1.09 mg/dL (ref 0.50–1.10)
Potassium: 5.3 mEq/L (ref 3.5–5.3)

## 2010-11-27 MED ORDER — ORPHENADRINE CITRATE ER 100 MG PO TB12: 100.0000 mg | ORAL_TABLET | Freq: Two times a day (BID) | ORAL | Status: DC | PRN

## 2010-11-27 MED ORDER — PRAVASTATIN SODIUM 20 MG PO TABS: 20.0000 mg | ORAL_TABLET | Freq: Every evening | ORAL | Status: DC

## 2010-11-27 MED ORDER — OXYCODONE-ACETAMINOPHEN 5-500 MG PO CAPS: 1.0000 | ORAL_CAPSULE | Freq: Three times a day (TID) | ORAL | Status: DC | PRN

## 2010-11-27 MED ORDER — ZOLPIDEM TARTRATE 10 MG PO TABS: 10.0000 mg | ORAL_TABLET | Freq: Every evening | ORAL | Status: DC | PRN

## 2010-11-27 MED ORDER — METOPROLOL TARTRATE 25 MG PO TABS: 25.0000 mg | ORAL_TABLET | Freq: Two times a day (BID) | ORAL | Status: DC

## 2010-11-27 NOTE — Assessment & Plan Note (Signed)
Infrequent palpitations. WIll continue beta blockade at current dose as BP supports this. Will check TSH in setting of weight loss.

## 2010-11-27 NOTE — Patient Instructions (Addendum)
Nice to meet you. I will refill your medications. I will call you if your lab tests are abnormal. Schedule your mammogram in next month. Make appointment in one month to discuss weight loss and smoking cessation. One of the most important ways to improve your health is to quit smoking.  Smoking Cessation, Tips for Success YOU CAN QUIT SMOKING If you are ready to quit smoking, congratulations! You have chosen to help yourself be healthier. Cigarettes bring nicotine, tar, carbon monoxide, and other irritants into your body. Your lungs, heart, and blood vessels will be able to work better without these poisons. There are lots of different ways to quit smoking. Nicotine gum, nicotine patches, a nicotine inhaler, or nicotine nasal spray help with physical craving. Hypnosis, support groups, and medicines help break the habit of smoking. Here are some tips to help you quit for good. 1. Throw away all cigarettes.  2. Clean and remove all ashtrays from your home, work, and car.  3. On a card, write down your reasons for quitting. Carry the card with you and read it when you get the urge to smoke.  4. Cleanse your body of nicotine. Drink enough water and fluids to keep your urine clear or pale yellow. Do this after quitting to flush the nicotine from your body.  5. Learn to predict your moods. Do not let a bad situation be your excuse to have a cigarette. Some situations in your life might tempt you into wanting a cigarette.  6. Never have "just one" cigarette. It leads to wanting another and another. Remind yourself of your decision to quit.  7. Change habits associated with smoking. If you smoked while driving or when feeling stressed, try other activities to replace smoking. Stand up when drinking your coffee. Brush your teeth after eating. Sit in a different chair when you read the paper. Avoid alcohol while trying to quit, and try to drink fewer caffeinated beverages. Alcohol and caffeine may urge you to  smoke.  8. Avoid foods and drinks that can trigger a desire to smoke, such as sugary or spicy foods and alcohol.  9. Ask people who smoke not to smoke around you.  10. Have something planned to do right after eating or having a cup of coffee. Take a walk or exercise to perk you up. This will help to keep you from overeating.  11. Try a relaxation exercise to calm you down and decrease your stress. Remember, you may be tense and nervous in the first 2 weeks after you quit, but this will pass.  12. Find new activities to keep your hands busy. Play with a pen, coin, or rubber band. Doodle or draw things on paper.  13. Brush your teeth right after eating. This will help cut down on the craving for the taste of tobacco after meals. You can try mouthwash, too.  14. Use oral substitutes, such as lemon drops, carrots, a cinnamon stick, or chewing gum, in place of cigarettes. Keep them handy so they are available when you have the urge to smoke.  15. When you have the urge to smoke, try deep breathing.  16. Designate your home as a nonsmoking area.  17. If you are a heavy smoker, ask your caregiver about a prescription for nicotine chewing gum. It can ease your withdrawal from nicotine.  18. Reward yourself. Set aside the cigarette money you save and buy yourself something nice.  19. Look for support from others. Join a support group or  smoking cessation program. Ask someone at home or at work to help you with your plan to quit smoking.  20. Always ask yourself, "Do I need this cigarette or is this just a reflex?" Tell yourself, "Today, I choose not to smoke," or "I do not want to smoke." You are reminding yourself of your decision to quit, even if you do smoke a cigarette.  HOW WILL I FEEL WHEN I QUIT SMOKING?  The benefits of not smoking start within days of quitting.   You may have symptoms of withdrawal because your body is used to nicotine (the addictive substance in cigarettes). You may crave  cigarettes, be irritable, feel very hungry, cough often, get headaches, or have difficulty concentrating.   The withdrawal symptoms are only temporary. They are strongest when you first quit but will go away within 10 to 14 days.   When withdrawal symptoms occur, stay in control. Think about your reasons for quitting. Remind yourself that these are signs that your body is healing and getting used to being without cigarettes.   Remember that withdrawal symptoms are easier to treat than the major diseases that smoking can cause.   Even after the withdrawal is over, expect periodic urges to smoke. However, these cravings are generally short-lived and will go away whether you smoke or not. Do not smoke!   If you relapse and smoke again, do not lose hope. Of the people who quit, 75% relapse. Most smokers quit 3 times before they are successful.   If you relapse, do not give up! Plan ahead and think about what you will do the next time you get the urge to smoke.  LIFE AS A NONSMOKER: MAKE IT FOR A MONTH, MAKE IT FOR LIFE Day 1 Hang this page where you will see it every day. Day 2 Get rid of all ashtrays, matches, and lighters. Day 3 Drink water. Breathe deeply between sips. Day 4 Avoid places with smoke-filled air, such as bars, clubs, or the smoking section of restaurants. Day 5 Keep track of how much money you save by not smoking. Day 6 Avoid boredom. Keep a good book with you or go to the movies. Day 7 Reward yourself! One week without smoking! Day 8 Make a dental appointment to get your teeth cleaned. Day 9 Decide how you will turn down a cigarette before it is offered to you. Day 10 Review your reasons for quitting. Day 11 Distract yourself. Stay active to keep your mind off smoking and to relieve tension. Take a walk, exercise, read a book, do a crossword puzzle, or try a new hobby. Day 12 Exercise. Get off the bus before your stop or use stairs instead of escalators. Day 13 Call on  friends for support and encouragement. Day 14 Reward yourself! Two weeks without smoking! Day 15 Practice deep breathing exercises. Day 16 Bet a friend that you can stay a nonsmoker. Day 17 Ask to sit in nonsmoking sections of restaurants. Day 18 Hang up "No Smoking" signs. Day 19 Think of yourself as a nonsmoker. Day 20 Each morning, tell yourself you will not smoke. Day 21 Reward yourself! Three weeks without smoking! Day 22 Think of smoking in negative ways.  Remember how it stains your teeth, gives you bad breath, and shortens your breath. Day 23 Eat a nutritious breakfast. Day 24 Do not relive your days as a smoker. Day 25 Hold a pencil in your hand when talking on the telephone. Day 26 Tell all your friends  you do not smoke. Day 27 Think about how much better food tastes. Day 28 Remember, one cigarette is one too many. Day 29 Take up a hobby that will keep your hands busy. Day 30 Congratulations! One month without smoking! Give yourself a big reward. Your caregiver can direct you to community resources or hospitals for support, which may include:  Group support.   Education.   Hypnosis.   Subliminal therapy.  Document Released: 01/19/2004 Document Re-Released: 07/17/2009 [facility] Patient Information 2011 [place], [place].

## 2010-11-27 NOTE — Assessment & Plan Note (Addendum)
Likely secondary to anorexia given negative malignancy workup, PPD in 02/2010. Patient cannot tolerate megace. Will check TSH today and electrolytes, CBC to evaluate for malnutrition. Patient has undergone nutrition evaluation and reinforced concept of eating small meals throughout the day even if poor appetite. Ensure TID. F/u in one month.
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
## 2010-11-27 NOTE — Assessment & Plan Note (Signed)
Refilled ambien. 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 PATP.

## 2010-11-27 NOTE — Assessment & Plan Note (Signed)
Will refill current doses of norflex and tylox as these are improved regimens from former levels of narcotics. No new red flag symptoms of back pain. Will obtain pain contract at next visit.

## 2010-11-27 NOTE — Assessment & Plan Note (Signed)
Precontemplative. Discussed importance of cessation in regards to likely COPD, taste abnormalities.

## 2010-11-27 NOTE — Progress Notes (Signed)
Addended by: [place], Antavia Tandy on: 11/27/2010 05:32 PM   Modules accepted: Orders

## 2010-11-28 ENCOUNTER — Telehealth: Payer: Self-pay | Admitting: Family Medicine

## 2010-11-28 DIAGNOSIS — R05 Cough: Secondary | ICD-10-CM | POA: Insufficient documentation

## 2010-11-28 LAB — LDL CHOLESTEROL, DIRECT: Direct LDL: 115 mg/dL — ABNORMAL HIGH

## 2010-11-28 NOTE — Assessment & Plan Note (Signed)
Likely secondary to smoking and early COPD. With weight loss will check 2-view chest film to evaluate for infection/malignancy, although likely low yield with normal CT chest in 02/2010.

## 2010-11-28 NOTE — Progress Notes (Signed)
Addended by: Durwin Reges on: 11/28/2010 11:21 AM   Modules accepted: Orders

## 2010-11-28 NOTE — Telephone Encounter (Signed)
Paula Duncan called about order for chest xray.  Call her back when scheduled

## 2010-11-29 NOTE — Telephone Encounter (Signed)
Informed pt that she can walk in and have this done.Laureen Ochs, Viann Shove

## 2010-11-30 ENCOUNTER — Ambulatory Visit
Admission: RE | Admit: 2010-11-30 | Discharge: 2010-11-30 | Disposition: A | Discharge status: Home / Self Care | Payer: Medicare Other | Source: Ambulatory Visit | Attending: Family Medicine | Admitting: Family Medicine

## 2010-11-30 DIAGNOSIS — R634 Abnormal weight loss: Secondary | ICD-10-CM

## 2010-12-01 ENCOUNTER — Encounter: Payer: Self-pay | Admitting: Family Medicine

## 2010-12-03 ENCOUNTER — Telehealth: Payer: Self-pay | Admitting: Family Medicine

## 2010-12-03 NOTE — Telephone Encounter (Signed)
Will forward to Lloyd Huger, MD ordering MD.

## 2010-12-03 NOTE — Telephone Encounter (Signed)
Would like to know results of xrays from last week.

## 2010-12-04 NOTE — Telephone Encounter (Signed)
Returned call to discuss chest xray. No focal findings, but c/w COPD. Recommend she make appointment to discuss treatments and reinforced smoking cessation. On chantix currently. May need repeat PFTs.

## 2010-12-26 ENCOUNTER — Encounter: Payer: Self-pay | Admitting: Family Medicine

## 2010-12-26 ENCOUNTER — Ambulatory Visit (INDEPENDENT_AMBULATORY_CARE_PROVIDER_SITE_OTHER): Payer: Medicare Other | Admitting: Family Medicine

## 2010-12-26 DIAGNOSIS — F17200 Nicotine dependence, unspecified, uncomplicated: Secondary | ICD-10-CM

## 2010-12-26 DIAGNOSIS — E785 Hyperlipidemia, unspecified: Secondary | ICD-10-CM

## 2010-12-26 DIAGNOSIS — Z720 Tobacco use: Secondary | ICD-10-CM

## 2010-12-26 DIAGNOSIS — M545 Low back pain: Secondary | ICD-10-CM

## 2010-12-26 MED ORDER — ALBUTEROL SULFATE HFA 108 (90 BASE) MCG/ACT IN AERS: 2.0000 | INHALATION_SPRAY | Freq: Four times a day (QID) | RESPIRATORY_TRACT | Status: DC | PRN

## 2010-12-26 MED ORDER — ORPHENADRINE CITRATE ER 100 MG PO TB12: 100.0000 mg | ORAL_TABLET | Freq: Two times a day (BID) | ORAL | Status: DC | PRN

## 2010-12-26 MED ORDER — ORPHENADRINE CITRATE ER 100 MG PO TB12: 100.0000 mg | ORAL_TABLET | Freq: Three times a day (TID) | ORAL | Status: DC | PRN

## 2010-12-26 MED ORDER — OXYCODONE-ACETAMINOPHEN 5-500 MG PO CAPS: 1.0000 | ORAL_CAPSULE | Freq: Three times a day (TID) | ORAL | Status: DC | PRN

## 2010-12-26 MED ORDER — PRAVASTATIN SODIUM 20 MG PO TABS: 20.0000 mg | ORAL_TABLET | Freq: Every evening | ORAL | Status: DC

## 2010-12-26 MED ORDER — TRAZODONE HCL 50 MG PO TABS: 50.0000 mg | ORAL_TABLET | Freq: Every day | ORAL | Status: DC

## 2010-12-26 MED ORDER — LISINOPRIL 10 MG PO TABS: 10.0000 mg | ORAL_TABLET | Freq: Every day | ORAL | Status: DC

## 2010-12-26 NOTE — Progress Notes (Signed)
  Subjective:    Patient ID: Paula Duncan, female    DOB: 19-Sep-1960, 50 y.o.   MRN: [id number]  HPI 1. Tobacco cessation Wants to quit however husband smokes in house. 3 packs a day. Pt. Smokes 1/2 a pack a day and has been taking chantix on and off.  She suffers from COPD. She is not oxygen dependant.  2. Light-headedness when getting out of bed in AM Taking Beta Blocker. No falls, no vertigo, no syncope. Blood pressure at home has been 100 systolic.  Review of Systems  Constitutional: Negative for fever, fatigue and unexpected weight change.  HENT: Negative for hearing loss.   Eyes: Negative for visual disturbance.  Respiratory: Negative for shortness of breath.   Cardiovascular: Negative for chest pain, palpitations and leg swelling.  Musculoskeletal: Positive for back pain. Negative for gait problem.  Skin: Negative for rash.  Neurological: Positive for light-headedness. Negative for syncope, facial asymmetry and headaches.       Objective:   Physical Exam  Nursing note and vitals reviewed. Constitutional: She appears well-developed and well-nourished. No distress.  Skin: She is not diaphoretic.      Assessment & Plan:  1. Tobacco cessation -Dr. Raymondo Band referral - [facility] quitline number - would recommend seriously talking to husband about acceptable locations for his smoking.  2. Light-headedness when getting out of bed in AM - DC'd metoprolol, started lisinopril 10mg  QD. No renal dysfunction.

## 2010-12-26 NOTE — Patient Instructions (Signed)
Follow up with me in one month.  Please schedule an appointment to see Dr. Raymondo Band in the Smoking Cessation clinic.  I changed your blood pressure medication.

## 2010-12-27 NOTE — Progress Notes (Signed)
Discussed case with Dr. Rivka Safer.

## 2010-12-31 ENCOUNTER — Telehealth: Payer: Self-pay | Admitting: Family Medicine

## 2010-12-31 NOTE — Telephone Encounter (Signed)
Is thinking that the dosage for the Lisinopril might be wrong because it doesn't seem to be helping her BP.  Also the Trazadone dosage used to be 100mg  and now it is 50mg  and that isn't working for her.

## 2011-01-01 ENCOUNTER — Telehealth: Payer: Self-pay | Admitting: Family Medicine

## 2011-01-01 MED ORDER — TRAZODONE HCL 50 MG PO TABS: 100.0000 mg | ORAL_TABLET | Freq: Every day | ORAL | Status: DC

## 2011-01-01 MED ORDER — LISINOPRIL 10 MG PO TABS: 10.0000 mg | ORAL_TABLET | Freq: Every day | ORAL | Status: DC

## 2011-01-01 MED ORDER — LISINOPRIL 10 MG PO TABS: 20.0000 mg | ORAL_TABLET | Freq: Every day | ORAL | Status: DC

## 2011-01-01 NOTE — Telephone Encounter (Signed)
Discussed medications with patients. She is taking 10 mg lisinopril daily. She is still getting orthostatic dizziness. I asked her to check her blood pressure and stop taking the medication if her bp is normal or low.

## 2011-01-08 ENCOUNTER — Ambulatory Visit (INDEPENDENT_AMBULATORY_CARE_PROVIDER_SITE_OTHER): Payer: Medicare Other | Admitting: Pharmacist

## 2011-01-08 ENCOUNTER — Encounter: Payer: Self-pay | Admitting: Pharmacist

## 2011-01-08 VITALS — BP 107/72 | HR 105 | Ht 64.0 in | Wt 91.7 lb

## 2011-01-08 DIAGNOSIS — I4949 Other premature depolarization: Secondary | ICD-10-CM

## 2011-01-08 DIAGNOSIS — F17200 Nicotine dependence, unspecified, uncomplicated: Secondary | ICD-10-CM

## 2011-01-08 MED ORDER — NICOTINE 14 MG/24HR TD PT24: 1.0000 | MEDICATED_PATCH | TRANSDERMAL | Status: DC

## 2011-01-08 NOTE — Progress Notes (Signed)
  Subjective:    Patient ID: Paula Duncan, female    DOB: 20-Mar-1961, 50 y.o.   MRN: [id number]  HPI Age when started using tobacco on a daily basis 11. Number of Cigarettes per day 10. Brand smoked KOOL/generics full Menthol. Estimated Nicotine Content per Cigarette (mg) 1.  Estimated Nicotine intake per day 10-12mg .   Denies waking to smoke  Estimated Fagerstrom Score 6/10.  Longest time ever been tobacco free 4 weeks. What Medications (NRT, bupropion, varenicline) used in past includes varenicline.  Rates IMPORTANCE of quitting tobacco on 1-10 scale of 10. Rates READINESS of quitting tobacco on 1-10 scale of 9. Rates CONFIDENCE of quitting tobacco on 1-10 scale of 10. Triggers to use tobacco include; TV watching and computer use.     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    Initiated nicotine replacement tx Patches 14mg . Rx Called to [employer] and provided [facility] for medicaid covered patches. Patient counseled on purpose, proper use, and potential adverse effects, including   Written information provided. Provided information on 1 800-QUIT NOW support program.  F/U phone call 2 weeks.   F/U Rx Clinic Visit 6-8 weeks.   Total time with patient in face-to-face counseling 40 minutes.  Patient seen with Tomi Bamberger, PharmD Candidate and Benjaman Pott, Pharmacy Resident

## 2011-01-08 NOTE — Assessment & Plan Note (Signed)
Patient recently started on lisinopril.  And states she has had to bend over to treat her dizziness with standing.  She has continued to try and take the lisinopril BUT does NOT appear to be able to tolerate this medication at current dose.  She is trying to quit smoking.  Asked her to stop lisinopril AND will plan to reassess use AFTER she makes tobacco cessation quit attempt.  If able to quit she may NOT need any BP med.  She may also be a candidate for an alternative rate control agent (diltiazem) if her tachycardia is bothersome.  F/U next visit with Dr. Rivka Safer.

## 2011-01-08 NOTE — Assessment & Plan Note (Signed)
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    Initiated nicotine replacement tx Patches 14mg . Rx Called to [facility] and provided [facility] for medicaid covered patches. Patient counseled on purpose, proper use, and potential adverse effects, including   Written information provided. Provided information on 1 800-QUIT NOW support program.  F/U phone call 2 weeks.   F/U Rx Clinic Visit 6-8 weeks.   Total time with patient in face-to-face counseling 40 minutes.  Patient seen with Tomi Bamberger, PharmD Candidate and Benjaman Pott, Pharmacy Resident

## 2011-01-08 NOTE — Progress Notes (Signed)
  Subjective:    Patient ID: Paula Duncan, female    DOB: Jan 08, 1961, 50 y.o.   MRN: [id number]  HPI Reviewed and agree with Dr. Macky Lower management.     Review of Systems     Objective:   Physical Exam        Assessment & Plan:

## 2011-01-08 NOTE — Patient Instructions (Addendum)
Quit Date: Friday, January 18, 2011  Call 1-800-QUIT-NOW  Between today and your quit date, decrease your smoking to 8 cigarettes or less per day. Do not smoke between 7-8 pm.   Nicotine 14mg  patches called into [employer]. Alternate patch application sites on body. Only use one patch per day. Keep patch on for 24 hours--if you have trouble sleeping, you can take the patch off before bedtime.  Stop taking lisinopril.

## 2011-01-17 ENCOUNTER — Telehealth: Payer: Self-pay | Admitting: Family Medicine

## 2011-01-17 NOTE — Telephone Encounter (Signed)
Was taken off her BP meds on 9/4 and is now dizzy - wants to know what to do

## 2011-01-21 ENCOUNTER — Telehealth: Payer: Self-pay | Admitting: Pharmacist

## 2011-01-21 NOTE — Telephone Encounter (Signed)
No answer

## 2011-01-29 ENCOUNTER — Encounter: Payer: Self-pay | Admitting: Family Medicine

## 2011-01-29 ENCOUNTER — Ambulatory Visit (INDEPENDENT_AMBULATORY_CARE_PROVIDER_SITE_OTHER): Payer: Medicare Other | Admitting: Family Medicine

## 2011-01-29 ENCOUNTER — Ambulatory Visit (HOSPITAL_COMMUNITY)
Admission: RE | Admit: 2011-01-29 | Discharge: 2011-01-29 | Disposition: A | Discharge status: Home / Self Care | Payer: Medicare Other | Source: Ambulatory Visit | Attending: Family Medicine | Admitting: Family Medicine

## 2011-01-29 VITALS — BP 110/68 | HR 150 | Temp 98.1°F | Ht 64.0 in | Wt 91.8 lb

## 2011-01-29 DIAGNOSIS — I498 Other specified cardiac arrhythmias: Secondary | ICD-10-CM | POA: Insufficient documentation

## 2011-01-29 DIAGNOSIS — R000 Tachycardia, unspecified: Secondary | ICD-10-CM

## 2011-01-29 DIAGNOSIS — M545 Low back pain: Secondary | ICD-10-CM

## 2011-01-29 MED ORDER — METOPROLOL TARTRATE 50 MG PO TABS: 50.0000 mg | ORAL_TABLET | Freq: Two times a day (BID) | ORAL | Status: DC

## 2011-01-29 MED ORDER — OXYCODONE-ACETAMINOPHEN 5-500 MG PO CAPS: 1.0000 | ORAL_CAPSULE | Freq: Three times a day (TID) | ORAL | Status: DC | PRN

## 2011-01-29 NOTE — Patient Instructions (Signed)
Your Blood pressure was normal today. Continue to work hard on quitting smoking.

## 2011-01-29 NOTE — Progress Notes (Signed)
  Subjective:    Patient ID: Paula Duncan, female    DOB: 1960-06-27, 50 y.o.   MRN: [id number]  HPI Review of Systems  Physical Exam    Paula Duncan is a 50 y.o. female who presents for evaluation of dizziness. The symptoms started 1 month ago and are worse. The attacks occur frequently and last a few minutes. Positions that worsen symptoms: standing up. Previous workup/treatments: none. Associated ear symptoms: none. Associated CNS symptoms: none. Recent infections: none. Head trauma: denied. Drug ingestion: sedatives. Noise exposure: no occupational exposure and no firearm exposure. Family history: non-contributory.  The following portions of the patient's history were reviewed and updated as appropriate: allergies, current medications, past family history, past medical history, past social history, past surgical history and problem list.  Review of Systems Constitutional: positive for none Ears, nose, mouth, throat, and face: negative for earaches, hearing loss and voice change Cardiovascular: positive for chest pressure/discomfort, dyspnea, near-syncope and orthopnea, negative for claudication, irregular heart beat, paroxysmal nocturnal dyspnea and syncope Neurological: negative    Objective:    BP 110/68  Pulse 150  Temp(Src) 98.1 F (36.7 C) (Oral)  Ht 5\' 4"  (1.626 m)  Wt 91 lb 12.8 oz (41.64 kg)  BMI 15.76 kg/m2 General appearance: alert and cooperative Head: Normocephalic, without obvious abnormality, atraumatic Neck: no adenopathy, no carotid bruit, no JVD, supple, symmetrical, trachea midline and thyroid not enlarged, symmetric, no tenderness/mass/nodules Lungs: clear to auscultation bilaterally and normal percussion bilaterally Heart: regular rate and rhythm, S1, S2 normal, no murmur, click, rub or gallop tachycardic Pulses: 2+ and symmetric Neurologic: Grossly normal      Assessment:    Sinus tachycardia with pre-syncope    Plan:    Follow up in 1 month. referral to  Dr. Jacinto Halim with [facility]  Would evaluate with stress test and echo. Restart Metoprolol to control tachycardia, which appears to be causing her presyncope symptoms.  Chronic back pain: Oxycodone one month script.

## 2011-01-30 ENCOUNTER — Telehealth: Payer: Self-pay | Admitting: Family Medicine

## 2011-01-30 NOTE — Telephone Encounter (Signed)
Want to know if she should stop taking baby aspirin now that she is on the new med.

## 2011-01-30 NOTE — Telephone Encounter (Signed)
No she should still take baby aspirin.  Thank you

## 2011-01-31 ENCOUNTER — Telehealth: Payer: Self-pay | Admitting: Family Medicine

## 2011-01-31 NOTE — Telephone Encounter (Signed)
Pt is asking if she should continue taking baby asperin every morning due to racing heart.

## 2011-02-01 NOTE — Telephone Encounter (Signed)
Yes she can continue Aspirin.  I referred her to Dr. Jacinto Halim with cardiology.

## 2011-02-06 ENCOUNTER — Telehealth: Payer: Self-pay | Admitting: Family Medicine

## 2011-02-06 NOTE — Telephone Encounter (Signed)
Hello. She needs to call [facility] to schedule an appointment. Thank you,

## 2011-02-06 NOTE — Telephone Encounter (Signed)
Paula Duncan was out at the [facility] yesterday and almost had a syncopal episode.  Need to see the cardiologist asap.  Please call her with confirmation of appt

## 2011-02-07 NOTE — Telephone Encounter (Signed)
I already put the referral in several weeks ago. Check with Tonya.

## 2011-02-07 NOTE — Telephone Encounter (Signed)
Will she still need a referral first before calling to sched an appt?

## 2011-02-11 ENCOUNTER — Telehealth: Payer: Self-pay | Admitting: Family Medicine

## 2011-02-11 NOTE — Telephone Encounter (Signed)
Called and informed pt that her referral information has been sent and we are waiting to hear back from the cardiologist office. She stated that she is still having problems with her heart racing. I told her that I would check with Dr. Gillermo Murdoch ofc to find out what is going on with the referral..Kazoua Gossen, Viann Shove

## 2011-02-11 NOTE — Telephone Encounter (Signed)
Wants to know status of cardiology referral

## 2011-02-13 ENCOUNTER — Telehealth: Payer: Self-pay | Admitting: Family Medicine

## 2011-02-13 NOTE — Telephone Encounter (Signed)
Discussed with patient that I would not fill vicodin until last narcotic prescription expired. She will see me on the 25th and bring her pills to show me that she isn't using them.

## 2011-02-13 NOTE — Telephone Encounter (Signed)
Pt is wanting to go back to Vicodin instead of the new pain med.  States that it's not working. [facility] [address]

## 2011-02-26 ENCOUNTER — Encounter: Payer: Self-pay | Admitting: Family Medicine

## 2011-02-26 ENCOUNTER — Ambulatory Visit (INDEPENDENT_AMBULATORY_CARE_PROVIDER_SITE_OTHER): Payer: Medicare Other | Admitting: Family Medicine

## 2011-02-26 VITALS — BP 112/72 | HR 102 | Temp 97.6°F | Ht 64.0 in | Wt 92.2 lb

## 2011-02-26 DIAGNOSIS — J440 Chronic obstructive pulmonary disease with acute lower respiratory infection: Secondary | ICD-10-CM

## 2011-02-26 DIAGNOSIS — J209 Acute bronchitis, unspecified: Secondary | ICD-10-CM

## 2011-02-26 MED ORDER — FLUTICASONE-SALMETEROL 250-50 MCG/DOSE IN AEPB: 1.0000 | INHALATION_SPRAY | Freq: Two times a day (BID) | RESPIRATORY_TRACT | Status: DC

## 2011-02-26 MED ORDER — AZITHROMYCIN 500 MG PO TABS: 500.0000 mg | ORAL_TABLET | Freq: Every day | ORAL | Status: AC

## 2011-02-26 MED ORDER — HYDROCODONE-ACETAMINOPHEN 5-500 MG PO TABS: 1.0000 | ORAL_TABLET | Freq: Four times a day (QID) | ORAL | Status: DC | PRN

## 2011-02-26 MED ORDER — PREDNISONE (PAK) 10 MG PO TABS: 10.0000 mg | ORAL_TABLET | Freq: Every day | ORAL | Status: AC

## 2011-02-26 NOTE — Patient Instructions (Signed)

## 2011-02-26 NOTE — Progress Notes (Deleted)
  Subjective:    Patient ID: Paula Duncan, female    DOB: 03-Oct-1960, 50 y.o.   MRN: [id number]  HPI 1   Review of Systems     Objective:   Physical Exam        Assessment & Plan:

## 2011-02-26 NOTE — Progress Notes (Signed)
  Subjective:    Paula Duncan is a 50 y.o. female with known COPD who presents with exacerbation. Current symptoms include: acute dyspnea, cough productive of white sputum in small amounts and wheezing. Symptoms began 3 days ago. Patient uses 1 pillows at night. Patient currently is not on home oxygen therapy..   The following portions of the patient's history were reviewed and updated as appropriate: allergies, current medications, past family history, past medical history, past social history, past surgical history and problem list.  Review of Systems Pertinent items are noted in HPI.     Objective:    BP 112/72  Pulse 102  Temp(Src) 97.6 F (36.4 C) (Oral)  Ht 5\' 4"  (1.626 m)  Wt 92 lb 3.2 oz (41.822 kg)  BMI 15.83 kg/m2  SpO2 100% Oxygen saturation 100% on room air BP 112/72  Pulse 102  Temp(Src) 97.6 F (36.4 C) (Oral)  Ht 5\' 4"  (1.626 m)  Wt 92 lb 3.2 oz (41.822 kg)  BMI 15.83 kg/m2  SpO2 100% General appearance: alert and cooperative very skinny Head: Normocephalic, without obvious abnormality, atraumatic Angular cheilitis Throat: lips, mucosa, and tongue normal; teeth and gums normal Back: symmetric, no curvature. ROM normal. No CVA tenderness. Lungs: rales bilaterally, rhonchi bilaterally and wheezes bilaterally Heart: regular rate and rhythm, S1, S2 normal, no murmur, click, rub or gallop     Assessment:    COPD with acute bronchitis    Plan:    Discussed diagnosis, its evaluation, treatment and usual course. All questions answered. Agricultural engineer distributed. Steroid burst per orders. Antibiotics per orders. Follow up with PCP in PRN  or as needed.

## 2011-02-26 NOTE — Assessment & Plan Note (Signed)
Patient has recently quit smoking. She now has a productive cough with wheezing and dyspnea She has not been on a longer acting COPD medication Will start Advair, continue albuterol. Will treat this current exacerbation with 3 days of oral steroid, azithromycin for 5 days, and use of her COPD controller meds.

## 2011-03-06 ENCOUNTER — Telehealth: Payer: Self-pay | Admitting: Family Medicine

## 2011-03-06 NOTE — Telephone Encounter (Signed)
Will forward to Dr Orton 

## 2011-03-06 NOTE — Telephone Encounter (Signed)
Paula Duncan went to see Dr. Jacinto Halim and he told her to get some compression socks but they cost over $60.00 and she wants to know if there is something else she could use.  She did call Dr. Jacinto Halim first, but he didn't seem to have any other suggestions.

## 2011-03-07 NOTE — Telephone Encounter (Signed)
Tell patient she could use an elastic wrap, but this will be annoying to put on and take off. She could always search online for cheaper deals on compression hose.

## 2011-03-08 NOTE — Telephone Encounter (Signed)
Pt advised to ck about less $$ socks, pt states she just remembered she has some compression sox from a hospitalization that she will use. Also, pt states tessalon perles not helping w/cough at night. Using robitussin DM as well. Wants something stronger.

## 2011-03-11 NOTE — Telephone Encounter (Signed)
Will forward again to Dr Rivka Safer

## 2011-03-13 MED ORDER — GUAIFENESIN-CODEINE 100-10 MG/5ML PO SYRP: 5.0000 mL | ORAL_SOLUTION | Freq: Three times a day (TID) | ORAL | Status: DC | PRN

## 2011-03-13 NOTE — Telephone Encounter (Signed)
Called in Robitussin [facility] (with codeine)

## 2011-03-13 NOTE — Telephone Encounter (Signed)
LVM advising pt of med called in.

## 2011-03-20 ENCOUNTER — Telehealth: Payer: Self-pay | Admitting: Family Medicine

## 2011-03-20 ENCOUNTER — Ambulatory Visit: Payer: Medicare Other | Admitting: Family Medicine

## 2011-03-20 NOTE — Telephone Encounter (Signed)
The Pharmacy has sent a refill request on the cough medicine for [name].  She is calling to check on the status.  She spoke to [facility] yesterday and was told that it is in Dr. Rolene Arbour box.

## 2011-03-27 ENCOUNTER — Ambulatory Visit (INDEPENDENT_AMBULATORY_CARE_PROVIDER_SITE_OTHER): Payer: Medicare Other | Admitting: Family Medicine

## 2011-03-27 ENCOUNTER — Ambulatory Visit: Payer: Medicare Other | Admitting: Family Medicine

## 2011-03-27 ENCOUNTER — Encounter: Payer: Self-pay | Admitting: Family Medicine

## 2011-03-27 DIAGNOSIS — Z4800 Encounter for change or removal of nonsurgical wound dressing: Secondary | ICD-10-CM | POA: Insufficient documentation

## 2011-03-27 DIAGNOSIS — M545 Low back pain: Secondary | ICD-10-CM

## 2011-03-27 DIAGNOSIS — Z4889 Encounter for other specified surgical aftercare: Secondary | ICD-10-CM

## 2011-03-27 DIAGNOSIS — J449 Chronic obstructive pulmonary disease, unspecified: Secondary | ICD-10-CM

## 2011-03-27 DIAGNOSIS — F17200 Nicotine dependence, unspecified, uncomplicated: Secondary | ICD-10-CM

## 2011-03-27 DIAGNOSIS — G4700 Insomnia, unspecified: Secondary | ICD-10-CM

## 2011-03-27 DIAGNOSIS — J441 Chronic obstructive pulmonary disease with (acute) exacerbation: Secondary | ICD-10-CM | POA: Insufficient documentation

## 2011-03-27 MED ORDER — ZOLPIDEM TARTRATE 10 MG PO TABS: 10.0000 mg | ORAL_TABLET | Freq: Every evening | ORAL | Status: DC | PRN

## 2011-03-27 MED ORDER — HYDROCODONE-ACETAMINOPHEN 5-500 MG PO TABS: 1.0000 | ORAL_TABLET | Freq: Four times a day (QID) | ORAL | Status: DC | PRN

## 2011-03-27 MED ORDER — ORPHENADRINE CITRATE ER 100 MG PO TB12: 100.0000 mg | ORAL_TABLET | Freq: Three times a day (TID) | ORAL | Status: DC | PRN

## 2011-03-27 MED ORDER — MELOXICAM 7.5 MG PO TABS: 7.5000 mg | ORAL_TABLET | Freq: Every day | ORAL | Status: DC

## 2011-03-27 MED ORDER — FLUTICASONE-SALMETEROL 250-50 MCG/DOSE IN AEPB: 1.0000 | INHALATION_SPRAY | Freq: Two times a day (BID) | RESPIRATORY_TRACT | Status: DC

## 2011-03-27 NOTE — Patient Instructions (Signed)
It was great to see you today!  Schedule an appointment for PFT's.  Great work on your smoking cessation.

## 2011-03-27 NOTE — Assessment & Plan Note (Signed)
Palpable abdominal suture from previous laparotomy closure. Intact without sign if inflammation. Patient very skinny, easy to palpate - normal.

## 2011-03-27 NOTE — Assessment & Plan Note (Signed)
Has quit smoking for one month now.  Using E-Cigarette for cessation.

## 2011-03-27 NOTE — Progress Notes (Signed)
  Subjective:    Patient ID: Paula Duncan, female    DOB: 08-Mar-1961, 50 y.o.   MRN: [id number]  HPI 1. Tobacco Abuse Quit one month ago. Using E-cigarette.   2. COPD Asking for PFT's to be performed for staging. Currently not on Oxygen. Recently quit smoking. Has cough.   3. Chronic lower back pain C/o increased pain in cold season. Using vicodin. Requesting oxycodone. No urinary symptoms or shooting pains.   4. Attention to suture - abdominal Patient worried about a palpable object on her abdomen. See PE below.   Review of Systems no fevers, chills, weight loss   Objective:   Physical Exam Filed Vitals:   03/27/11 1427  BP: 121/77  Pulse: 103  Temp: 98.2 F (36.8 C)  TempSrc: Oral  Weight: 97 lb 4.8 oz (44.135 kg)  Lungs:  Normal respiratory effort, chest expands symmetrically. Lungs are clear to auscultation, no crackles or wheezes. Heart - Regular rate and rhythm.  No murmurs, gallops or rubs.    Abdomen: soft and non-tender without masses, organomegaly or hernias noted.  No guarding or rebound. Patient has a palpable retained suture at lower midline closure scar. No erythema, swelling, warmth. Extremities:  No cyanosis, edema, or deformity noted.     Assessment & Plan:

## 2011-03-27 NOTE — Assessment & Plan Note (Signed)
Referral to Dr. Raymondo Band for PFT's Continue with advair. Smoking cessation should help a lot.

## 2011-04-10 ENCOUNTER — Telehealth: Payer: Self-pay | Admitting: Family Medicine

## 2011-04-10 NOTE — Telephone Encounter (Signed)
Pt has been wearing compression socks and says when she takes them off she feels dizzy, wants to know if she should be wearing them all the time or just when she is out and about?

## 2011-04-11 NOTE — Telephone Encounter (Signed)
She should wear them all the time. Except when showering.

## 2011-04-12 ENCOUNTER — Telehealth: Payer: Self-pay | Admitting: Pharmacist

## 2011-04-12 NOTE — Telephone Encounter (Signed)
Attempted to call RE tobacco cessation.  Patient reports complete cessation from smoking.   Plans to come in Tuesday 12/11 at 9:00 AM for PFT testing.   Congratulated her on her success with quitting tobacco.

## 2011-04-16 ENCOUNTER — Ambulatory Visit (INDEPENDENT_AMBULATORY_CARE_PROVIDER_SITE_OTHER): Payer: Medicare Other | Admitting: Pharmacist

## 2011-04-16 ENCOUNTER — Encounter: Payer: Self-pay | Admitting: Pharmacist

## 2011-04-16 VITALS — BP 88/60 | HR 60 | Ht 64.0 in | Wt 94.9 lb

## 2011-04-16 DIAGNOSIS — J449 Chronic obstructive pulmonary disease, unspecified: Secondary | ICD-10-CM

## 2011-04-16 MED ORDER — GUAIFENESIN-CODEINE 100-10 MG/5ML PO SYRP: 5.0000 mL | ORAL_SOLUTION | Freq: Three times a day (TID) | ORAL | Status: DC | PRN

## 2011-04-16 NOTE — Assessment & Plan Note (Signed)
Patient has been experiencing some coughing that has improved since quitting smoking, but continues to have worsenings at night and has been taking Robitussin AC 2-3 times per day. Continue current treatment plan at this time.  Reviewed results of pulmonary function tests.  Pt verbalized understanding of results. Called in prescription for Robitussin AC: 1 teaspoonful three times daily as needed for cough with no refills.  F/U Regular Clinic visit with Dr. Rivka Safer.  Total time in face to face counseling 45 minutes.  Patient seen with Benjaman Pott, PharmD Resident.

## 2011-04-16 NOTE — Progress Notes (Signed)
  Subjective:    Patient ID: Paula Duncan, female    DOB: April 25, 1961, 50 y.o.   MRN: [id number]  HPI  Patient presents to the clinic for spirometry in a pleasant mood. She reports being smoke free since about October and was happy to announce that she was successful in getting her husband to quit smoking as well. Persistent cough worse at night, but somewhat improved since quitting smoking ~36 yr pack history; max 10 cigarettes per day.    Review of Systems     Objective:   Physical Exam  See documentation flowsheet to see PFT results      Assessment & Plan:   Patient has been experiencing some coughing that has improved since quitting smoking, but continues to have worsenings at night and has been taking Robitussin AC 2-3 times per day. Continue current treatment plan at this time.  Reviewed results of near-normal pulmonary function tests that show slight improvement from 12/30/2008 PFTs.  Pt verbalized understanding of results. Called in prescription for Robitussin AC: 1 teaspoonful three times daily as needed for cough with no refills.  F/U Regular Clinic visit with Dr. Rivka Safer.  Total time in face to face counseling 45 minutes.  Patient seen with Benjaman Pott, PharmD Resident.

## 2011-04-16 NOTE — Progress Notes (Signed)
  Subjective:    Patient ID: Paula Duncan, female    DOB: 1960/05/31, 50 y.o.   MRN: [id number]  HPI Discussed and agree with Dr. Macky Lower management   Review of Systems     Objective:   Physical Exam        Assessment & Plan:

## 2011-04-24 ENCOUNTER — Other Ambulatory Visit: Payer: Self-pay | Admitting: Family Medicine

## 2011-04-24 MED ORDER — BENZONATATE 200 MG PO CAPS: 200.0000 mg | ORAL_CAPSULE | Freq: Two times a day (BID) | ORAL | Status: AC | PRN

## 2011-04-25 NOTE — Telephone Encounter (Signed)
Left vm for pt to return call to be sure she has gotten the response from MD.

## 2011-05-08 ENCOUNTER — Encounter: Payer: Self-pay | Admitting: Family Medicine

## 2011-05-08 ENCOUNTER — Ambulatory Visit (INDEPENDENT_AMBULATORY_CARE_PROVIDER_SITE_OTHER): Payer: Medicare Other | Admitting: Family Medicine

## 2011-05-08 VITALS — BP 124/78 | HR 92 | Temp 98.0°F | Ht 64.0 in | Wt 98.0 lb

## 2011-05-08 DIAGNOSIS — M545 Low back pain, unspecified: Secondary | ICD-10-CM | POA: Diagnosis not present

## 2011-05-08 DIAGNOSIS — J449 Chronic obstructive pulmonary disease, unspecified: Secondary | ICD-10-CM | POA: Diagnosis not present

## 2011-05-08 MED ORDER — ORPHENADRINE CITRATE ER 100 MG PO TB12: 100.0000 mg | ORAL_TABLET | Freq: Three times a day (TID) | ORAL | Status: DC | PRN

## 2011-05-08 MED ORDER — PREDNISONE (PAK) 10 MG PO TABS: 20.0000 mg | ORAL_TABLET | Freq: Every day | ORAL | Status: AC

## 2011-05-08 MED ORDER — HYDROCODONE-ACETAMINOPHEN 5-500 MG PO TABS: 1.0000 | ORAL_TABLET | Freq: Four times a day (QID) | ORAL | Status: DC | PRN

## 2011-05-08 MED ORDER — GUAIFENESIN-CODEINE 100-10 MG/5ML PO SYRP: 5.0000 mL | ORAL_SOLUTION | Freq: Three times a day (TID) | ORAL | Status: DC | PRN

## 2011-05-08 MED ORDER — ALBUTEROL SULFATE HFA 108 (90 BASE) MCG/ACT IN AERS: 2.0000 | INHALATION_SPRAY | Freq: Four times a day (QID) | RESPIRATORY_TRACT | Status: DC | PRN

## 2011-05-08 MED ORDER — ESZOPICLONE 2 MG PO TABS: 2.0000 mg | ORAL_TABLET | Freq: Every day | ORAL | Status: DC

## 2011-05-08 MED ORDER — FLUTICASONE-SALMETEROL 250-50 MCG/DOSE IN AEPB: 1.0000 | INHALATION_SPRAY | Freq: Two times a day (BID) | RESPIRATORY_TRACT | Status: DC

## 2011-05-08 MED ORDER — PROMETHAZINE HCL 12.5 MG PO TABS: 12.5000 mg | ORAL_TABLET | Freq: Four times a day (QID) | ORAL | Status: DC | PRN

## 2011-05-08 NOTE — Progress Notes (Signed)
  Subjective:    Patient ID: Paula Duncan, female    DOB: [birth year], 51 y.o.   MRN: [id number]  HPI 1. COPD/ worsened SOB Patient has had worsened SOB recently without fever. She has no recent illness. She has no change in her medications. Currently not on Oxygen. Recently quit smoking. Has cough. She has one episode of productive yellow sputum. No sinus symptoms.   Review of Systems no fevers, chills, weight loss   Objective:   Physical Exam Filed Vitals:   05/08/11 1023  BP: 124/78  Pulse: 92  Temp: 98 F (36.7 C)  Height: 5\' 4"  (1.626 m)  Weight: 98 lb (44.453 kg)  Lungs:  Normal respiratory effort, chest expands symmetrically. Lungs have scattered wheezes, worse in b/l [place] Heart - Regular rate and rhythm.  No murmurs, gallops or rubs.    Abdomen: soft and non-tender without masses, organomegaly or hernias noted.  No guarding or rebound. Patient has a palpable retained suture at lower midline closure scar. No erythema, swelling, warmth. Extremities:  No cyanosis, edema, or deformity noted.     Assessment & Plan:

## 2011-05-08 NOTE — Patient Instructions (Signed)
It was great to see you today!  Schedule an appointment to see me as needed.  I prescribed prednisone for 5 days for your COPD symptoms.

## 2011-05-08 NOTE — Assessment & Plan Note (Signed)
Patient is having a small COPD exacerbation.  Will start a 5 day course of prednisone. She knows to return if not improved.  No need for antibiotics at this juncture.

## 2011-05-14 ENCOUNTER — Telehealth: Payer: Self-pay | Admitting: Family Medicine

## 2011-05-14 MED ORDER — TRAZODONE HCL 50 MG PO TABS: 25.0000 mg | ORAL_TABLET | Freq: Every evening | ORAL | Status: DC | PRN

## 2011-05-14 NOTE — Telephone Encounter (Signed)
Spoke with patient and asked her which medication she is referring to going back on. Patient states that she would like to go back on the Trazodone/Ambien combination again.

## 2011-05-14 NOTE — Telephone Encounter (Signed)
Pt states that the Alfonso Patten is not working and wants to be put back on old med.

## 2011-05-27 ENCOUNTER — Other Ambulatory Visit: Payer: Self-pay | Admitting: Family Medicine

## 2011-06-03 ENCOUNTER — Other Ambulatory Visit: Payer: Self-pay | Admitting: Family Medicine

## 2011-06-03 MED ORDER — BACLOFEN 10 MG PO TABS: 10.0000 mg | ORAL_TABLET | Freq: Three times a day (TID) | ORAL | Status: DC

## 2011-06-06 ENCOUNTER — Ambulatory Visit (INDEPENDENT_AMBULATORY_CARE_PROVIDER_SITE_OTHER): Payer: Medicare Other | Admitting: Family Medicine

## 2011-06-06 ENCOUNTER — Telehealth: Payer: Self-pay | Admitting: Family Medicine

## 2011-06-06 ENCOUNTER — Encounter: Payer: Self-pay | Admitting: Family Medicine

## 2011-06-06 VITALS — BP 98/66 | HR 110 | Temp 98.2°F | Ht 64.0 in | Wt 103.0 lb

## 2011-06-06 DIAGNOSIS — Z760 Encounter for issue of repeat prescription: Secondary | ICD-10-CM | POA: Diagnosis not present

## 2011-06-06 DIAGNOSIS — J449 Chronic obstructive pulmonary disease, unspecified: Secondary | ICD-10-CM

## 2011-06-06 DIAGNOSIS — Z189 Retained foreign body fragments, unspecified material: Secondary | ICD-10-CM | POA: Diagnosis not present

## 2011-06-06 DIAGNOSIS — T8189XA Other complications of procedures, not elsewhere classified, initial encounter: Secondary | ICD-10-CM

## 2011-06-06 DIAGNOSIS — K432 Incisional hernia without obstruction or gangrene: Secondary | ICD-10-CM | POA: Insufficient documentation

## 2011-06-06 MED ORDER — ALBUTEROL SULFATE (2.5 MG/3ML) 0.083% IN NEBU: 2.5000 mg | INHALATION_SOLUTION | Freq: Four times a day (QID) | RESPIRATORY_TRACT | Status: DC | PRN

## 2011-06-06 MED ORDER — HYDROCODONE-ACETAMINOPHEN 7.5-750 MG PO TABS: 1.0000 | ORAL_TABLET | Freq: Four times a day (QID) | ORAL | Status: DC | PRN

## 2011-06-06 MED ORDER — PROMETHAZINE HCL 25 MG PO TABS: 25.0000 mg | ORAL_TABLET | Freq: Three times a day (TID) | ORAL | Status: DC | PRN

## 2011-06-06 MED ORDER — TRAZODONE HCL 50 MG PO TABS: 100.0000 mg | ORAL_TABLET | Freq: Every evening | ORAL | Status: DC | PRN

## 2011-06-06 MED ORDER — VARENICLINE TARTRATE 1 MG PO TABS: 1.0000 mg | ORAL_TABLET | Freq: Two times a day (BID) | ORAL | Status: DC
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
## 2011-06-06 NOTE — Telephone Encounter (Signed)
The dx code was rejected by Medicare.  Need an alternate code.

## 2011-06-06 NOTE — Assessment & Plan Note (Signed)
Will increase her vicodin to 7.5 from 5. Advised about the risks of tolerance and addiction.  Told her to stop the advair for a week. And restart.

## 2011-06-06 NOTE — Telephone Encounter (Signed)
Patient has called back inquiring to Nebulizer and Baclofen.

## 2011-06-06 NOTE — Patient Instructions (Signed)
It was great to see you today!  Schedule an appointment to see me as needed.  Great work on your [employer].

## 2011-06-06 NOTE — Progress Notes (Signed)
Addended by: Edd Arbour on: 06/06/2011 12:02 PM   Modules accepted: Orders

## 2011-06-06 NOTE — Telephone Encounter (Signed)
Patient was in and got the Rx for Breathing treatment, but she doesn't have a Nebulizer.  Also, she wants to know why he prescribed Baclofen.

## 2011-06-06 NOTE — Progress Notes (Deleted)
  Subjective:    Patient ID: Paula Duncan, female    DOB: [birth year], 51 y.o.   MRN: [id number]  HPI    Review of Systems     Objective:   Physical Exam        Assessment & Plan:

## 2011-06-06 NOTE — Assessment & Plan Note (Signed)
Asymptomatic, no surgery or intervention warranted.

## 2011-06-07 NOTE — Telephone Encounter (Signed)
Returned call to patient.  Was seen by Dr. Rivka Safer yesterday and given Rx for albuterol solution yesterday.  She needs orders for nebulizer.    Also needs refill of Robitussin with codeine. Would also like medication for her sinus infection---has pain above and around her eyes.  Patient states she is not on Baclofen, but med was Rx'd for her earlier this week.  Will route note to PCP and call her back.  Gaylene Brooks, RN

## 2011-06-07 NOTE — Telephone Encounter (Signed)
Needs to know where to get nebulizer

## 2011-06-10 ENCOUNTER — Telehealth: Payer: Self-pay | Admitting: Family Medicine

## 2011-06-10 NOTE — Telephone Encounter (Signed)
Patient hasn't heard anything about the Nebulizer.  [facility] needs code to fill the Albuterol.  Also, she is asking about a refill on her cough medication.

## 2011-06-11 NOTE — Telephone Encounter (Signed)
Paula Ha, Do you remember what code we originally used. Nebulizer-----did we do anything with that. I thought we gave her a rx for this. And about the cough medicine refill wise it has codeine and would you like it refilled?

## 2011-06-11 NOTE — Telephone Encounter (Signed)
The patient is calling back about the code, the cough medicine and who the nebulizer will be coming from.

## 2011-06-12 ENCOUNTER — Other Ambulatory Visit: Payer: Self-pay | Admitting: Family Medicine

## 2011-06-12 MED ORDER — GUAIFENESIN-CODEINE 100-10 MG/5ML PO SYRP: 5.0000 mL | ORAL_SOLUTION | Freq: Three times a day (TID) | ORAL | Status: DC | PRN

## 2011-06-12 NOTE — Telephone Encounter (Signed)
Note routed to PCP and Huntley Dec, CMA.  Gaylene Brooks, RN

## 2011-06-12 NOTE — Telephone Encounter (Signed)
Patient informed that neb order was sent to [facility] and that we are faxing the cough medicine to her pharmacy per her request

## 2011-06-12 NOTE — Telephone Encounter (Signed)
Faxed neb order to [facility]

## 2011-06-12 NOTE — Telephone Encounter (Signed)
Pt is calling back about needing her cough medicine.  She is in great need of it and needs it authorized.

## 2011-06-13 ENCOUNTER — Other Ambulatory Visit: Payer: Self-pay | Admitting: Family Medicine

## 2011-06-13 MED ORDER — ORPHENADRINE CITRATE ER 100 MG PO TB12: 100.0000 mg | ORAL_TABLET | Freq: Two times a day (BID) | ORAL | Status: DC

## 2011-06-13 NOTE — Telephone Encounter (Signed)
The Rx for Muscle Relaxer was the wrong one.  Baclofen was sent but she needs Norflecs.  Please let her know when this has been done.

## 2011-06-17 ENCOUNTER — Ambulatory Visit (INDEPENDENT_AMBULATORY_CARE_PROVIDER_SITE_OTHER): Payer: Medicare Other | Admitting: Family Medicine

## 2011-06-17 ENCOUNTER — Encounter: Payer: Self-pay | Admitting: Family Medicine

## 2011-06-17 VITALS — BP 110/72 | HR 90 | Temp 98.3°F | Ht 64.0 in | Wt 102.0 lb

## 2011-06-17 DIAGNOSIS — R300 Dysuria: Secondary | ICD-10-CM

## 2011-06-17 DIAGNOSIS — R05 Cough: Secondary | ICD-10-CM | POA: Diagnosis not present

## 2011-06-17 LAB — POCT URINALYSIS DIPSTICK
Bilirubin, UA: NEGATIVE
Glucose, UA: NEGATIVE
Ketones, UA: NEGATIVE
Nitrite, UA: NEGATIVE
Protein, UA: NEGATIVE
Spec Grav, UA: 1.025
Urobilinogen, UA: 0.2
pH, UA: 5.5

## 2011-06-17 LAB — POCT UA - MICROSCOPIC ONLY

## 2011-06-17 MED ORDER — GUAIFENESIN-CODEINE 100-10 MG/5ML PO SYRP: 5.0000 mL | ORAL_SOLUTION | Freq: Three times a day (TID) | ORAL | Status: DC | PRN

## 2011-06-17 MED ORDER — ALBUTEROL SULFATE (2.5 MG/3ML) 0.083% IN NEBU: 2.5000 mg | INHALATION_SOLUTION | Freq: Four times a day (QID) | RESPIRATORY_TRACT | Status: DC | PRN

## 2011-06-17 MED ORDER — ORPHENADRINE CITRATE ER 100 MG PO TB12: 100.0000 mg | ORAL_TABLET | Freq: Two times a day (BID) | ORAL | Status: DC

## 2011-06-17 NOTE — Progress Notes (Signed)
  Subjective:    Patient ID: Paula Duncan, female    DOB: 01/17/1961, 51 y.o.   MRN: [id number]  HPI 1. Medication management Patient continues to have a chronic cough. She takes her cough medicine in the morning afternoon and night and it helps her sleep without waking up with coughing spells. No side effects.   Review of Systems Pertinent items are noted in HPI. No fever, chills, night sweats, weight loss.     Objective:   Physical Exam Lungs:  Normal respiratory effort, chest expands symmetrically. Lungs are clear to auscultation, no crackles or wheezes. Mouth - no lesions, mucous membranes are moist, no decaying teeth    Assessment & Plan:

## 2011-06-17 NOTE — Assessment & Plan Note (Signed)
Refilled robitussin with codeine.  She uses this three times a day.

## 2011-06-17 NOTE — Patient Instructions (Signed)
It was great to see you today!  Schedule an appointment to see me as needed.   I refilled your cough medicine today.

## 2011-06-20 ENCOUNTER — Telehealth: Payer: Self-pay | Admitting: Family Medicine

## 2011-06-20 DIAGNOSIS — G4700 Insomnia, unspecified: Secondary | ICD-10-CM

## 2011-06-20 MED ORDER — ZOLPIDEM TARTRATE 10 MG PO TABS: 10.0000 mg | ORAL_TABLET | Freq: Every evening | ORAL | Status: DC | PRN

## 2011-06-20 NOTE — Telephone Encounter (Signed)
Returned call to patient.  Will have to complete prior authorization form and submit to [facility] for possible approval.  Will call patient back pending approval.  Prior authorization form placed in Dr. Rolene Arbour box for completion.  Gaylene Brooks, RN

## 2011-06-20 NOTE — Telephone Encounter (Signed)
Patient is calling because her pharmacy has told her that her insurance no longer covers Norflex and she needs something to replace it.

## 2011-06-21 NOTE — Telephone Encounter (Signed)
Received notice from medicaid that patient has medicare. Form placed in MD box for completion

## 2011-06-24 ENCOUNTER — Telehealth: Payer: Self-pay | Admitting: Family Medicine

## 2011-06-24 NOTE — Telephone Encounter (Signed)
Patient is calling to find out if something has been done for her Norflex and she wants to know the results of her Urine test.

## 2011-06-24 NOTE — Telephone Encounter (Signed)
Called and informed pt of urine results. And told her that her meds were sent into her pharmacy. She then informed me that the Norflex has to have a prior authorization. Pt I told pt that I will forward this to our nurse that does prior auth. Pt is in pain and requesting that this be taken care of ASAP please.Loralee Pacas Ogilvie

## 2011-06-24 NOTE — Telephone Encounter (Signed)
Patient has called back again

## 2011-06-25 NOTE — Telephone Encounter (Signed)
PA has been received from [employer]. Pharmacy notified.  They will call patient.

## 2011-07-05 ENCOUNTER — Other Ambulatory Visit: Payer: Self-pay | Admitting: Family Medicine

## 2011-07-05 MED ORDER — HYDROCODONE-ACETAMINOPHEN 7.5-750 MG PO TABS: 1.0000 | ORAL_TABLET | Freq: Four times a day (QID) | ORAL | Status: DC | PRN
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
## 2011-07-23 ENCOUNTER — Telehealth: Payer: Self-pay | Admitting: Family Medicine

## 2011-07-23 NOTE — Telephone Encounter (Signed)
Pt is having palpitations more frequently and is starting to get worried about it.  Have sched an appt with provider on 4/3, but need to speak with someone to advise what she can do to lessen the symptoms until she can see her provider

## 2011-07-23 NOTE — Telephone Encounter (Signed)
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  Has been having some "fluttering" and it is starting to happen more frequently.  Checked pulse & it is 108 bpm.  Denies chest pain.  Has SOB but this is "normal" with her COPD per patient.  Wants to know if there is something she can do to lessen sx.  Informed that unable to give advice without her being evaluated.  Offered appt to be evaluated, but patient declined.  Prefers to only see Dr. Rivka Safer since he "knows my history."  Patient informed to call back or go to urgent care/ED if symptoms worsen.  Paula Brooks, RN

## 2011-08-07 ENCOUNTER — Encounter: Payer: Self-pay | Admitting: Family Medicine

## 2011-08-07 ENCOUNTER — Ambulatory Visit (INDEPENDENT_AMBULATORY_CARE_PROVIDER_SITE_OTHER): Payer: Medicare Other | Admitting: Family Medicine

## 2011-08-07 VITALS — BP 102/62 | HR 116 | Temp 98.0°F | Ht 64.0 in | Wt 102.6 lb

## 2011-08-07 DIAGNOSIS — I4949 Other premature depolarization: Secondary | ICD-10-CM | POA: Diagnosis not present

## 2011-08-07 MED ORDER — ATENOLOL 25 MG PO TABS: 25.0000 mg | ORAL_TABLET | Freq: Every day | ORAL | Status: DC

## 2011-08-07 NOTE — Patient Instructions (Signed)
It was great to see you today!  Schedule an appointment to see me in 2 weeks.  I have started atenolol for your fast pulse. Stop taking this medication if it makes you dizzy or you feel faint.

## 2011-08-07 NOTE — Progress Notes (Signed)
  Subjective:    Patient ID: Paula Duncan, female    DOB: [birth year], 51 y.o.   MRN: [id number]  HPI 1. Palpitations No lightheaded-ness or dizziness. She still has orthostatic symptoms when standing. She is wearing the ted-hose per Dr. Nadara Eaton. She has no syncope or falls. She hasn't smoked since before christmas she says. She feels flutters, especially at night. Her HR is consistently above 110. She has a diagnosis of PVC's from Dr. Jodi Marble office. No chf per echo in oct./12.  Tobacco use: Patient is a former smoker. Advised patient to about the risks of smoking to health. Review of Systems Pertinent items are noted in HPI.     Objective:   Physical Exam Filed Vitals:   08/07/11 1454  BP: 102/62  Pulse: 116  Temp: 98 F (36.7 C)  TempSrc: Oral  Height: 5\' 4"  (1.626 m)  Weight: 102 lb 9.6 oz (46.539 kg)  Pulse oximetry on room air is 98% (at home) Heart - Regular rate and rhythm.  No murmurs, gallops or rubs.    Lungs:  Normal respiratory effort, chest expands symmetrically. Lungs are clear to auscultation, no crackles or wheezes. Pulse: tachycardic left radial, no afib palpaple.     Assessment & Plan:

## 2011-08-07 NOTE — Assessment & Plan Note (Signed)
Will start Atenolol today because she still has them and it is bothering her. Her blood pressure is low-normal - this cardioselective medication will be ideal. BP Readings from Last 3 Encounters:  08/07/11 102/62  06/17/11 110/72  06/06/11 98/66

## 2011-08-08 ENCOUNTER — Other Ambulatory Visit: Payer: Self-pay | Admitting: Family Medicine

## 2011-08-08 MED ORDER — TRAZODONE HCL 50 MG PO TABS: 100.0000 mg | ORAL_TABLET | Freq: Every evening | ORAL | Status: DC | PRN

## 2011-08-27 ENCOUNTER — Encounter: Payer: Self-pay | Admitting: Family Medicine

## 2011-08-27 ENCOUNTER — Ambulatory Visit (INDEPENDENT_AMBULATORY_CARE_PROVIDER_SITE_OTHER): Payer: Medicare Other | Admitting: Family Medicine

## 2011-08-27 ENCOUNTER — Telehealth: Payer: Self-pay | Admitting: *Deleted

## 2011-08-27 ENCOUNTER — Other Ambulatory Visit: Payer: Self-pay | Admitting: Family Medicine

## 2011-08-27 VITALS — BP 122/64 | HR 116 | Temp 98.1°F | Ht 64.0 in | Wt 102.3 lb

## 2011-08-27 DIAGNOSIS — R630 Anorexia: Secondary | ICD-10-CM

## 2011-08-27 DIAGNOSIS — I4949 Other premature depolarization: Secondary | ICD-10-CM | POA: Diagnosis not present

## 2011-08-27 MED ORDER — TRAZODONE HCL 50 MG PO TABS: 100.0000 mg | ORAL_TABLET | Freq: Every evening | ORAL | Status: DC | PRN

## 2011-08-27 MED ORDER — MIRTAZAPINE 15 MG PO TABS: 15.0000 mg | ORAL_TABLET | Freq: Every day | ORAL | Status: DC

## 2011-08-27 MED ORDER — TRAZODONE HCL 100 MG PO TABS: 100.0000 mg | ORAL_TABLET | Freq: Every day | ORAL | Status: DC

## 2011-08-27 NOTE — Telephone Encounter (Signed)
[facility] calling.  Patient was seen in office today and they received E-rx for Trazodone 50 mg tab---take 2 tabs at bedtime.  Patient wants to have 100mg  tablets---one tablet at bedtime instead of 50 mg tabs.  Will route note to Dr. Rivka Safer and call pharmacy back.  Gaylene Brooks, RN

## 2011-08-27 NOTE — Progress Notes (Signed)
  Subjective:    Patient ID: Paula Duncan, female    DOB: 04-20-1961, 51 y.o.   MRN: [id number]  HPI 1. Palpitations No lightheaded-ness or dizziness. She has started taking Atenolol as prescribed and she is doing well. Only one episode of palpations and no orthostatic hypotension. she is wearing her ted hose.She has no syncope or falls. She hasn't smoked since before christmas she says. Her HR is consistently above 110. She has a diagnosis of PVC's from Dr. Jodi Marble office. No chf per echo in oct./12.  2. Underweight She says she tries to eat, but her appetite goes away really fast. Body mass index is 17.56 kg/(m^2). She has tried megace in the past.  Tobacco use: Patient is a former smoker. Advised patient to about the risks of smoking to health. No drugs alcohol or sex.  Review of Systems Pertinent items are noted in HPI.     Objective:   Physical Exam Filed Vitals:   08/27/11 1459  BP: 122/64  Pulse: 116  Temp: 98.1 F (36.7 C)  TempSrc: Oral  Height: 5\' 4"  (1.626 m)  Weight: 102 lb 4.8 oz (46.403 kg)  Heart - Regular rate and rhythm.  No murmurs, gallops or rubs.    Lungs:  Normal respiratory effort, chest expands symmetrically. Lungs are clear to auscultation, no crackles or wheezes. Pulse: tachycardic left radial, no afib palpaple.     Assessment & Plan:

## 2011-08-27 NOTE — Assessment & Plan Note (Signed)
Added remeron 15 mg qhs for diet stimulation and sleep.

## 2011-08-27 NOTE — Assessment & Plan Note (Signed)
Well controlled. Only one episode of palpitations. No hypotension episodes.

## 2011-08-27 NOTE — Patient Instructions (Signed)
It was great to see you today!  Schedule an appointment to see me as needed.  Meds ordered this encounter  Medications  . mirtazapine (REMERON) 15 MG tablet    Sig: Take 1 tablet (15 mg total) by mouth at bedtime.    Dispense:  30 tablet    Refill:  3  This will help you with your appetite and sleep.

## 2011-10-14 ENCOUNTER — Other Ambulatory Visit: Payer: Self-pay | Admitting: Family Medicine

## 2011-10-14 DIAGNOSIS — G4700 Insomnia, unspecified: Secondary | ICD-10-CM

## 2011-10-14 MED ORDER — ZOLPIDEM TARTRATE 10 MG PO TABS: 10.0000 mg | ORAL_TABLET | Freq: Every evening | ORAL | Status: DC | PRN

## 2011-10-15 ENCOUNTER — Encounter: Payer: Self-pay | Admitting: Family Medicine

## 2011-10-15 ENCOUNTER — Ambulatory Visit (INDEPENDENT_AMBULATORY_CARE_PROVIDER_SITE_OTHER): Payer: Medicare Other | Admitting: Family Medicine

## 2011-10-15 VITALS — BP 102/60 | HR 86 | Temp 98.3°F | Ht 64.0 in | Wt 113.0 lb

## 2011-10-15 DIAGNOSIS — M25559 Pain in unspecified hip: Secondary | ICD-10-CM | POA: Diagnosis not present

## 2011-10-15 MED ORDER — TRIAMCINOLONE ACETONIDE 0.1 % EX CREA: TOPICAL_CREAM | Freq: Two times a day (BID) | CUTANEOUS | Status: AC
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
## 2011-10-15 NOTE — Patient Instructions (Signed)
It was great to see you today!  Schedule an appointment to see me as needed.  I injected your right hip today.   It should start to feel better tomorrow.

## 2011-10-15 NOTE — Assessment & Plan Note (Signed)
Right hip pain. Injected today with 1:3 kenalog to lidocaine without complications.

## 2011-11-19 ENCOUNTER — Ambulatory Visit (INDEPENDENT_AMBULATORY_CARE_PROVIDER_SITE_OTHER): Payer: Medicare Other | Admitting: Sports Medicine

## 2011-11-19 ENCOUNTER — Encounter: Payer: Self-pay | Admitting: Sports Medicine

## 2011-11-19 VITALS — BP 112/73 | HR 78 | Temp 98.8°F | Ht 64.0 in | Wt 103.0 lb

## 2011-11-19 DIAGNOSIS — J449 Chronic obstructive pulmonary disease, unspecified: Secondary | ICD-10-CM

## 2011-11-19 DIAGNOSIS — M545 Low back pain: Secondary | ICD-10-CM

## 2011-11-19 DIAGNOSIS — R630 Anorexia: Secondary | ICD-10-CM

## 2011-11-19 DIAGNOSIS — F17200 Nicotine dependence, unspecified, uncomplicated: Secondary | ICD-10-CM

## 2011-11-19 MED ORDER — ORPHENADRINE CITRATE ER 100 MG PO TB12: 100.0000 mg | ORAL_TABLET | Freq: Three times a day (TID) | ORAL | Status: DC | PRN

## 2011-11-19 NOTE — Assessment & Plan Note (Signed)
Well controlled on Advair; only requiring albuterol 2-3X/s per month
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
## 2011-11-19 NOTE — Assessment & Plan Note (Signed)
Reports 10# wt loss in 1 month.  Due to stress of one of her cats being killed.  Reports awareness and ability to make necessary changes to stop/reverse her current weight loss.  Pt reports currently dealing with stresses and is already making the changes to start regaining her weight
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
## 2011-11-19 NOTE — Patient Instructions (Addendum)
It was nice to meet you today. We refilled your muscle relaxer. Please watch your weight. Keep taking your medicines as needed.

## 2011-11-19 NOTE — Assessment & Plan Note (Signed)
Refill norflex as pt reports significant improvement with this medicine.  Has had minimal issues and is currently managed well on her current medication regimen.  No red flags; no narcotics requested

## 2011-12-17 ENCOUNTER — Ambulatory Visit (INDEPENDENT_AMBULATORY_CARE_PROVIDER_SITE_OTHER): Payer: Medicare Other | Admitting: Family Medicine

## 2011-12-17 ENCOUNTER — Encounter: Payer: Self-pay | Admitting: Family Medicine

## 2011-12-17 VITALS — BP 112/62 | HR 70 | Temp 98.3°F | Wt 108.0 lb

## 2011-12-17 DIAGNOSIS — M545 Low back pain, unspecified: Secondary | ICD-10-CM

## 2011-12-17 MED ORDER — HYDROCODONE-ACETAMINOPHEN 7.5-750 MG PO TABS: 1.0000 | ORAL_TABLET | Freq: Four times a day (QID) | ORAL | Status: DC | PRN
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
## 2011-12-17 NOTE — Assessment & Plan Note (Addendum)
Will refill Vicodin ES.  Patient requested higher dose today, but I told her we would not change medications during our first visit. Will need to decrease Tylenol dose at next visit. Pain contract renewed today. Follow up in one month or sooner as needed.

## 2011-12-17 NOTE — Patient Instructions (Addendum)
It was nice to meet you today, Paula Duncan. Please continue to practice physical therapy exercises 3 days/week. Schedule follow up appointment with me in ONE month.

## 2011-12-24 ENCOUNTER — Other Ambulatory Visit: Payer: Self-pay | Admitting: Family Medicine

## 2011-12-24 MED ORDER — MIRTAZAPINE 15 MG PO TBDP: 15.0000 mg | ORAL_TABLET | Freq: Every day | ORAL | Status: DC

## 2011-12-25 ENCOUNTER — Other Ambulatory Visit: Payer: Self-pay | Admitting: Family Medicine

## 2011-12-25 MED ORDER — LOPERAMIDE HCL 2 MG PO TABS: 2.0000 mg | ORAL_TABLET | Freq: Four times a day (QID) | ORAL | Status: AC | PRN

## 2012-01-21 ENCOUNTER — Ambulatory Visit (INDEPENDENT_AMBULATORY_CARE_PROVIDER_SITE_OTHER): Payer: Medicare Other | Admitting: Family Medicine

## 2012-01-21 ENCOUNTER — Encounter: Payer: Self-pay | Admitting: Family Medicine

## 2012-01-21 VITALS — BP 98/64 | HR 80 | Temp 98.8°F | Ht 64.0 in | Wt 112.0 lb

## 2012-01-21 DIAGNOSIS — Z189 Retained foreign body fragments, unspecified material: Secondary | ICD-10-CM | POA: Diagnosis not present

## 2012-01-21 DIAGNOSIS — M545 Low back pain, unspecified: Secondary | ICD-10-CM | POA: Diagnosis not present

## 2012-01-21 DIAGNOSIS — F17200 Nicotine dependence, unspecified, uncomplicated: Secondary | ICD-10-CM

## 2012-01-21 DIAGNOSIS — I4949 Other premature depolarization: Secondary | ICD-10-CM | POA: Diagnosis not present

## 2012-01-21 MED ORDER — HYDROCODONE-ACETAMINOPHEN 10-500 MG PO TABS: 1.0000 | ORAL_TABLET | Freq: Three times a day (TID) | ORAL | Status: DC | PRN

## 2012-01-21 MED ORDER — ATENOLOL 25 MG PO TABS: 12.5000 mg | ORAL_TABLET | Freq: Every day | ORAL | Status: DC

## 2012-01-21 MED ORDER — HYDROCODONE-ACETAMINOPHEN 10-500 MG PO TABS: 1.0000 | ORAL_TABLET | Freq: Four times a day (QID) | ORAL | Status: DC | PRN

## 2012-01-21 NOTE — Assessment & Plan Note (Signed)
Quit smoking in October 2012.
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
## 2012-01-21 NOTE — Assessment & Plan Note (Addendum)
Knot under umbilicus likely retained suture, but now it is starting to cause mild discomfort with palpation. CT abdomen from 2011 did not show a hernia.  Will continue to monitor.  If clinically worsens, will repeat imaging. Recheck in one month.

## 2012-01-21 NOTE — Assessment & Plan Note (Signed)
Due to low BP 98/64 today, will decrease Atenolol to 12.5 mg daily. Repeat BP in one month.

## 2012-01-21 NOTE — Assessment & Plan Note (Signed)
Will change to Lortab 10-500 mg every 8 hours as needed for pain. Told patient I will not increase dose from now on and she understands. Had to decrease Tylenol from 750 to 500. Follow up in one month.

## 2012-01-21 NOTE — Patient Instructions (Addendum)
Please return to clinic in ONE month for blood pressure follow up. Cut back on Atenolol to 12.5 mg daily. Call if your home BP readings are higher than 140/90 or less than 90/50. Do not take sleeping medications and Vicodin at the same time.

## 2012-02-11 ENCOUNTER — Telehealth: Payer: Self-pay | Admitting: Family Medicine

## 2012-02-11 NOTE — Telephone Encounter (Signed)
Patient calling for refill on her hydrocodone until next appt

## 2012-02-11 NOTE — Telephone Encounter (Signed)
Called pt and informed her that she should have enough to last her until her appt on 10.15.13 with Dr.de la Sondra Come. She told me that Dr.de la Sondra Come was giving her #120 pills and this time she only gave her #90. She is asking if she can possibly get enough to last her until her next appt if possible. I told her that I would forward this to her however I could not guarantee that she will do that. Pt voiced understanding .Loralee Pacas [place]

## 2012-02-12 MED ORDER — CYCLOBENZAPRINE HCL 5 MG PO TABS: 5.0000 mg | ORAL_TABLET | Freq: Three times a day (TID) | ORAL | Status: DC | PRN

## 2012-02-12 MED ORDER — ACETAMINOPHEN 500 MG PO TABS: 500.0000 mg | ORAL_TABLET | Freq: Four times a day (QID) | ORAL | Status: DC | PRN

## 2012-02-12 NOTE — Telephone Encounter (Signed)
At last appointment, patient and I discussed increasing narcotics to 10-500 every 8 hours as needed for pain.  I can only write for a 30 day supply so I could only give #90 tablets.  If she is taking medication more frequently, patient will need to schedule an appointment with me to discuss this.  Please inform patient.  Thanks.

## 2012-02-12 NOTE — Telephone Encounter (Signed)
Forwarded to pcp.Khristopher Kapaun [place]  

## 2012-02-12 NOTE — Telephone Encounter (Signed)
Please call patient and let her know I sent a new pain medication to her pharmacy to take in the meantime.  Thanks.

## 2012-02-12 NOTE — Telephone Encounter (Signed)
Pt notified and she is asking what she does in the mean time before her appt on 10/15 - she has 4 pills left - pls advise

## 2012-02-12 NOTE — Telephone Encounter (Signed)
Called and informed pt of Rx being sent into her pharmacy.Paula Duncan [place]

## 2012-02-12 NOTE — Addendum Note (Signed)
Addended by: Tye Savoy, IVY on: 02/12/2012 12:33 PM   Modules accepted: Orders

## 2012-02-12 NOTE — Telephone Encounter (Signed)
called

## 2012-02-12 NOTE — Telephone Encounter (Signed)
Please let patient know that prescription Tylenol was sent to her pharmacy, she can alternate Tylenol with L

## 2012-02-18 ENCOUNTER — Ambulatory Visit (INDEPENDENT_AMBULATORY_CARE_PROVIDER_SITE_OTHER): Payer: Medicare Other | Admitting: Family Medicine

## 2012-02-18 ENCOUNTER — Encounter: Payer: Self-pay | Admitting: Family Medicine

## 2012-02-18 VITALS — BP 135/78 | HR 91 | Temp 98.0°F | Wt 116.0 lb

## 2012-02-18 DIAGNOSIS — R51 Headache: Secondary | ICD-10-CM | POA: Insufficient documentation

## 2012-02-18 DIAGNOSIS — R519 Headache, unspecified: Secondary | ICD-10-CM | POA: Insufficient documentation

## 2012-02-18 DIAGNOSIS — M545 Low back pain, unspecified: Secondary | ICD-10-CM | POA: Diagnosis not present

## 2012-02-18 MED ORDER — KETOROLAC TROMETHAMINE 60 MG/2ML IM SOLN
30.0000 mg | Freq: Once | INTRAMUSCULAR | Status: AC
  Administered 2012-02-18: 30 mg via INTRAMUSCULAR

## 2012-02-18 MED ORDER — MIRTAZAPINE 15 MG PO TBDP: 15.0000 mg | ORAL_TABLET | Freq: Every day | ORAL | Status: DC

## 2012-02-18 MED ORDER — TRAZODONE HCL 50 MG PO TABS: 100.0000 mg | ORAL_TABLET | Freq: Every evening | ORAL | Status: DC | PRN

## 2012-02-18 MED ORDER — HYDROCODONE-ACETAMINOPHEN 10-500 MG PO TABS: 1.0000 | ORAL_TABLET | Freq: Three times a day (TID) | ORAL | Status: DC | PRN

## 2012-02-18 MED ORDER — PROMETHAZINE HCL 25 MG/ML IJ SOLN
12.5000 mg | Freq: Once | INTRAMUSCULAR | Status: AC
Start: 2012-02-18 — End: 2012-02-18
  Administered 2012-02-18: 12.5 mg via INTRAMUSCULAR

## 2012-02-18 NOTE — Patient Instructions (Addendum)
Go to pharmacy on 10/17 to refill Lortab (narcotics). One week before 11/17, please call our office to ask for a refill and come pick it up. For COPD, use albuterol every 6 hours scheduled for the next 48 hours. For BP, start taking Atenolol 25 mg DAILY. Schedule follow up appointment with me in 3 months or sooner as needed.  Headache Headaches are caused by many different problems. Most commonly, headache is caused by muscle tension from an injury, fatigue, or emotional upset. Excessive muscle contractions in the scalp and neck result in a headache that often feels like a tight band around the head. Tension headaches often have areas of tenderness over the scalp and the back of the neck. These headaches may last for hours, days, or longer, and some may contribute to migraines in those who have migraine problems. Migraines usually cause a throbbing headache, which is made worse by activity. Sometimes only one side of the head hurts. Nausea, vomiting, eye pain, and avoidance of food are common with migraines. Visual symptoms such as light sensitivity, blind spots, or flashing lights may also occur. Loud noises may worsen migraine headaches. Many factors may cause migraine headaches:  Emotional stress, lack of sleep, and menstrual periods.   Alcohol and some drugs (such as birth control pills).   Diet factors (fasting, caffeine, food preservatives, chocolate).   Environmental factors (weather changes, bright lights, odors, smoke).  Other causes of headaches include minor injuries to the head. Arthritis in the neck; problems with the jaw, eyes, ears, or nose are also causes of headaches. Allergies, drugs, alcohol, and exposure to smoke can also cause moderate headaches. Rebound headaches can occur if someone uses pain medications for a long period of time and then stops. Less commonly, blood vessel problems in the neck and brain (including stroke) can cause various types of headache. Treatment of  headaches includes medicines for pain and relaxation. Ice packs or heat applied to the back of the head and neck help some people. Massaging the shoulders, neck and scalp are often very useful. Relaxation techniques and stretching can help prevent these headaches. Avoid alcohol and cigarette smoking as these tend to make headaches worse. Please see your caregiver if your headache is not better in 2 days.  SEEK IMMEDIATE MEDICAL CARE IF:   You develop a high fever, chills, or repeated vomiting.   You faint or have difficulty with vision.   You develop unusual numbness or weakness of your arms or legs.   Relief of pain is inadequate with medication, or you develop severe pain.   You develop confusion, or neck stiffness.   You have a worsening of a headache or do not obtain relief.  Document Released: 04/22/2005 Document Revised: 04/11/2011 Document Reviewed: 10/16/2006 [facility] Patient Information 2012 [place], [place].

## 2012-02-18 NOTE — Assessment & Plan Note (Addendum)
Discussed narcotic dependency with patient and that I will not increase dose or frequency. Will refill Lortab every 30 days, patient can pick up next Rx at the front desk if she calls ahead of time. Continue Flexeril. Follow up in 3 months or sooner as needed.

## 2012-02-18 NOTE — Assessment & Plan Note (Signed)
Likely medication related - she is on chronic narcotics and muscle relaxers. No red flags on physical exam - no neurologic deficits. Will give Toradol and Phenergan IM in clinic today. Will increase Atenolol to 25 mg daily. Follow up as needed.
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
## 2012-02-26 ENCOUNTER — Telehealth: Payer: Self-pay | Admitting: Family Medicine

## 2012-02-26 MED ORDER — MIRTAZAPINE 15 MG PO TABS: 15.0000 mg | ORAL_TABLET | Freq: Every day | ORAL | Status: DC

## 2012-02-26 NOTE — Telephone Encounter (Signed)
New Rx sent to pharm

## 2012-03-02 ENCOUNTER — Other Ambulatory Visit: Payer: Self-pay | Admitting: *Deleted

## 2012-03-02 DIAGNOSIS — G4700 Insomnia, unspecified: Secondary | ICD-10-CM

## 2012-03-16 ENCOUNTER — Other Ambulatory Visit: Payer: Self-pay | Admitting: *Deleted

## 2012-03-16 NOTE — Telephone Encounter (Signed)
Patient is calling requesting the refill for Hydrocodone to be faxed to [facility].

## 2012-03-18 MED ORDER — HYDROCODONE-ACETAMINOPHEN 10-500 MG PO TABS: 1.0000 | ORAL_TABLET | Freq: Three times a day (TID) | ORAL | Status: DC | PRN

## 2012-03-18 NOTE — Telephone Encounter (Signed)
Will fax to pharmacy today. Thanks.

## 2012-04-11 ENCOUNTER — Other Ambulatory Visit: Payer: Self-pay | Admitting: *Deleted

## 2012-04-15 ENCOUNTER — Telehealth: Payer: Self-pay | Admitting: Family Medicine

## 2012-04-15 MED ORDER — HYDROCODONE-ACETAMINOPHEN 10-500 MG PO TABS: 1.0000 | ORAL_TABLET | Freq: Three times a day (TID) | ORAL | Status: DC | PRN

## 2012-04-15 NOTE — Telephone Encounter (Signed)
Called and informed pt.  

## 2012-04-15 NOTE — Telephone Encounter (Signed)
Please call patient and let her know it was not refused.  Lortab cannot be sent electronically to pharmacy.  Rx is ready at the front desk for pick up.  Thanks.

## 2012-04-15 NOTE — Telephone Encounter (Signed)
Pt is calling to find out why her hydrocodone was refused.

## 2012-04-21 ENCOUNTER — Ambulatory Visit: Payer: Medicare Other | Admitting: Family Medicine

## 2012-05-07 ENCOUNTER — Other Ambulatory Visit: Payer: Self-pay | Admitting: *Deleted

## 2012-05-08 MED ORDER — ALBUTEROL SULFATE (2.5 MG/3ML) 0.083% IN NEBU: 2.5000 mg | INHALATION_SOLUTION | Freq: Four times a day (QID) | RESPIRATORY_TRACT | Status: DC | PRN

## 2012-05-12 ENCOUNTER — Encounter: Payer: Self-pay | Admitting: Family Medicine

## 2012-05-12 ENCOUNTER — Ambulatory Visit (INDEPENDENT_AMBULATORY_CARE_PROVIDER_SITE_OTHER): Payer: Medicare Other | Admitting: Family Medicine

## 2012-05-12 VITALS — BP 116/56 | HR 86 | Temp 99.1°F | Ht 64.0 in | Wt 115.6 lb

## 2012-05-12 DIAGNOSIS — G4700 Insomnia, unspecified: Secondary | ICD-10-CM

## 2012-05-12 DIAGNOSIS — J440 Chronic obstructive pulmonary disease with acute lower respiratory infection: Secondary | ICD-10-CM | POA: Diagnosis not present

## 2012-05-12 DIAGNOSIS — J209 Acute bronchitis, unspecified: Secondary | ICD-10-CM

## 2012-05-12 MED ORDER — DOXYCYCLINE HYCLATE 100 MG PO TABS: 100.0000 mg | ORAL_TABLET | Freq: Two times a day (BID) | ORAL | Status: DC

## 2012-05-12 MED ORDER — ZOLPIDEM TARTRATE 10 MG PO TABS: 10.0000 mg | ORAL_TABLET | Freq: Every evening | ORAL | Status: DC | PRN

## 2012-05-12 MED ORDER — HYDROCODONE-ACETAMINOPHEN 10-500 MG PO TABS: 1.0000 | ORAL_TABLET | Freq: Three times a day (TID) | ORAL | Status: DC | PRN

## 2012-05-12 MED ORDER — IPRATROPIUM BROMIDE HFA 17 MCG/ACT IN AERS: 2.0000 | INHALATION_SPRAY | Freq: Two times a day (BID) | RESPIRATORY_TRACT | Status: DC

## 2012-05-12 MED ORDER — PREDNISONE 50 MG PO TABS: 50.0000 mg | ORAL_TABLET | Freq: Every day | ORAL | Status: DC

## 2012-05-12 NOTE — Progress Notes (Signed)
  Subjective:    Patient ID: Paula Duncan, female    DOB: Jul 16, 1960, 52 y.o.   MRN: [id number]  HPI  Patient presents to clinic for difficulty breathing about 3 weeks ago, now getting worse.  Associated with productive cough.  When patient has coughing spells, she has trouble taking deep breaths which worries her.  Also complains of chronic chest pain since she was diagnosed with COPD after she was on life support in 2011.  Chest pain has become progressively worse in the last 2-3 weeks.  Has been using albuterol inhaler three times per day with no improvement.  Denies any associated fevers at home, chills, diaphoresis, nausea or vomiting.  Former smoker - quit smoking 9 months ago.  Smoked tobacco 40 years - 1 PPD.  Review of Systems  Per HPI     Objective:   Physical Exam  Constitutional: No distress.  HENT:  Head: Normocephalic and atraumatic.  Mouth/Throat: Oropharynx is clear and moist. No oropharyngeal exudate.  Neck: Normal range of motion. Neck supple.  Cardiovascular: Normal rate, regular rhythm and normal heart sounds.   Pulmonary/Chest: Effort normal. No respiratory distress. She has no rales.       Faint scattered wheezes appreciated; good air entry      Assessment & Plan:

## 2012-05-12 NOTE — Assessment & Plan Note (Addendum)
Symptoms likely secondary to acute COPD exacerbation vs. Acute bronchitis.  With hx of tobacco use, will treat as COPD flare up.  Oxygen sat in clinic 99% RA. - Doxycycline BID x 7 days, Prednisone 50 x 5 days - Albuterol and Atrovent every 4 hours scheduled x 5 days - Red flags reviewed - Smoking cessation advised - Follow up as needed

## 2012-05-12 NOTE — Patient Instructions (Addendum)
Please take Prednisone, antibiotic, and breathing treatments as directed. Take it easy for the next 5 days and stay warm! Return to clinic if symptoms worsen or do not improve in 1-2 weeks.  Chronic Obstructive Pulmonary Disease Chronic obstructive pulmonary disease (COPD) is a lung disease. The lungs become damaged, making it hard to get air in and out of your lungs. The damage to your lungs cannot be changed.  HOME CARE  Stop smoking if you smoke. Avoid secondhand smoke.  Only take medicine as told by your doctor.  Talk to your doctor about using cough syrup or over-the-counter medicines.  Drink enough fluids to keep your pee (urine) clear or pale yellow.  Use a humidifier or vaporizer. This may help loosen the thick spit (mucus).  Talk to your doctor about vaccines that help prevent other lung problems (pneumonia and flu vaccines).  Use home oxygen as told by your doctor.  Stay active and exercise.  Eat healthy foods. GET HELP RIGHT AWAY IF:   Your heart is beating fast.  You become disturbed, confused, shake, or are dazed.  You have trouble breathing.  You have chest pain.  You have a fever.  You cough up thick spit that is yellowish-white or green.  Your breathing becomes worse when you exercise.  You are running out of the medicine you take for your breathing. MAKE SURE YOU:   Understand these instructions.  Will watch your condition.  Will get help right away if you are not doing well or get worse. Document Released: 10/09/2007 Document Revised: 07/15/2011 Document Reviewed: 06/22/2010 [facility] Patient Information 2013 [place], [place].

## 2012-05-20 ENCOUNTER — Telehealth: Payer: Self-pay | Admitting: Family Medicine

## 2012-05-20 MED ORDER — PROMETHAZINE HCL 25 MG RE SUPP: 25.0000 mg | Freq: Three times a day (TID) | RECTAL | Status: DC | PRN

## 2012-05-20 NOTE — Telephone Encounter (Signed)
Pt is throwing up her nausea pills and wants to know if she can have suppository ones.  [facility]

## 2012-05-20 NOTE — Telephone Encounter (Signed)
Sent Phenergan suppository to pharmacy.  Please notify patient.  Thanks!

## 2012-05-21 NOTE — Telephone Encounter (Signed)
Spoke with patient and informed her of below 

## 2012-06-23 ENCOUNTER — Ambulatory Visit (INDEPENDENT_AMBULATORY_CARE_PROVIDER_SITE_OTHER): Payer: Medicare Other | Admitting: Family Medicine

## 2012-06-23 ENCOUNTER — Ambulatory Visit: Payer: Medicare Other | Admitting: Family Medicine

## 2012-06-23 VITALS — BP 124/64 | Temp 99.0°F | Ht 64.0 in | Wt 117.0 lb

## 2012-06-23 DIAGNOSIS — G4700 Insomnia, unspecified: Secondary | ICD-10-CM | POA: Diagnosis not present

## 2012-06-23 DIAGNOSIS — Z760 Encounter for issue of repeat prescription: Secondary | ICD-10-CM | POA: Diagnosis not present

## 2012-06-23 DIAGNOSIS — J449 Chronic obstructive pulmonary disease, unspecified: Secondary | ICD-10-CM

## 2012-06-23 MED ORDER — ATENOLOL 25 MG PO TABS: 12.5000 mg | ORAL_TABLET | Freq: Every day | ORAL | Status: DC

## 2012-06-23 MED ORDER — BENZONATATE 200 MG PO CAPS: 200.0000 mg | ORAL_CAPSULE | Freq: Two times a day (BID) | ORAL | Status: DC | PRN

## 2012-06-23 MED ORDER — GUAIFENESIN-CODEINE 100-10 MG/5ML PO SYRP: 5.0000 mL | ORAL_SOLUTION | Freq: Three times a day (TID) | ORAL | Status: DC | PRN

## 2012-06-23 MED ORDER — ZOLPIDEM TARTRATE 10 MG PO TABS: 10.0000 mg | ORAL_TABLET | Freq: Every evening | ORAL | Status: DC | PRN

## 2012-06-23 MED ORDER — ALBUTEROL SULFATE HFA 108 (90 BASE) MCG/ACT IN AERS: 2.0000 | INHALATION_SPRAY | Freq: Four times a day (QID) | RESPIRATORY_TRACT | Status: DC | PRN

## 2012-06-23 MED ORDER — PREDNISONE 50 MG PO TABS: 50.0000 mg | ORAL_TABLET | Freq: Every day | ORAL | Status: DC

## 2012-06-23 MED ORDER — PROMETHAZINE HCL 25 MG PO TABS: 25.0000 mg | ORAL_TABLET | Freq: Three times a day (TID) | ORAL | Status: DC | PRN

## 2012-06-23 NOTE — Assessment & Plan Note (Signed)
Wheezing today on exam, no respiratory distress.  Will give 5 day burst of prednisone to try and prevent this from worsening into a COPD exacerbation. Refilled albuterol inhaler.

## 2012-06-23 NOTE — Assessment & Plan Note (Signed)
Given 30 day supplies for requested medications, will need to see pcp for further refills. Uses robitussin with codeine and tesalon for chronic cough.

## 2012-06-23 NOTE — Patient Instructions (Addendum)
Thank you for coming in today, it was good to see you Follow up with Dr. Tye Savoy when you return from [place].  Have a good trip Use the prednisone for days to help with wheezing.

## 2012-06-23 NOTE — Progress Notes (Signed)
  Subjective:    Patient ID: Paula Duncan, female    DOB: Feb 16, 1961, 52 y.o.   MRN: [id number]  HPI  1. MEd refill:  Patient comes in today with no complaints but does need a few medications refilled.  She is leaving to go on a trip to [place] in two days and didn't want to be without her medications.  Her medications she needs refilled are albuterol, tessalon, robitussin-ac, phenergan, albuterol inhaler, and ambien  2. Wheezing:  Slight increase in wheezing over the past week.  She has been trying to use her albuterol nebulizer but feels like her heart races with this medication.  Has used albuterol inhaler without this side effect.  Has used steroids in the past which has been helpful.  She is concerned about worsening when she gets to [place].   Review of Systems Per HPI    Objective:   Physical Exam  Constitutional: She is oriented to person, place, and time. She appears well-nourished. No distress.  HENT:  Head: Normocephalic and atraumatic.  Neck: Neck supple.  Cardiovascular: Normal rate, regular rhythm and normal heart sounds.   Pulmonary/Chest: Effort normal. No respiratory distress. She has wheezes (DIffuse bilateral expiratory wheezes.).  Neurological: She is alert and oriented to person, place, and time.          Assessment & Plan:

## 2012-06-24 ENCOUNTER — Telehealth: Payer: Self-pay | Admitting: Family Medicine

## 2012-06-24 MED ORDER — ATENOLOL 25 MG PO TABS: 25.0000 mg | ORAL_TABLET | Freq: Every day | ORAL | Status: DC

## 2012-06-24 NOTE — Telephone Encounter (Signed)
Patient is calling because her Atentolol was to be increased from 1/2tab per day to 1 whole tab per day but that isn't what was sent.  She needs this corrected right away because she is leaving for [place] tomorrow.

## 2012-06-24 NOTE — Telephone Encounter (Signed)
Done

## 2012-06-25 ENCOUNTER — Telehealth: Payer: Self-pay | Admitting: Family Medicine

## 2012-06-25 NOTE — Telephone Encounter (Signed)
Patient called yesterday because the dose of Atenolol was incorrect.  That has now been corrected, but she forgot that she also needed a refill on her Chantix.  She said that she doesn't absolutely need it before she leaves for [place], but if it can be refilled today, that would be great.

## 2012-06-25 NOTE — Telephone Encounter (Signed)
Chantix is no longer on her medication list.  She can schedule an appointment to discuss restarting medication after she returns from [place].

## 2012-06-25 NOTE — Telephone Encounter (Signed)
Left message on pt's voicemail that rx was called in. Elzina Devera, [name]

## 2012-06-25 NOTE — Telephone Encounter (Signed)
Related message,pt agrees to schedule an appt  with Dr Tye Savoy when she returns from [place]. Reedy Biernat, [name]

## 2012-07-13 ENCOUNTER — Telehealth: Payer: Self-pay | Admitting: Family Medicine
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
## 2012-07-13 NOTE — Telephone Encounter (Signed)
Spoke with Dr. Tye Savoy and she advises patient needs appointment .  States she will give RX to last until office visit appointment.  Scheduled appointment for 03/19 . Will send message back to Dr. Tye Savoy for  this refill .

## 2012-07-13 NOTE — Telephone Encounter (Signed)
Patient wants refill for pain medication sent to [facility] in [place].  Will send message to Dr. Tye Savoy.

## 2012-07-13 NOTE — Telephone Encounter (Signed)
Refill faxed to pharmacy today - 19 day supply.  Thanks, Larita Fife.

## 2012-07-13 NOTE — Telephone Encounter (Signed)
Patient is calling back because her pharmacy now has power and the pain medicine needs to be called to [phone] instead of [facility] now.

## 2012-07-22 ENCOUNTER — Ambulatory Visit (INDEPENDENT_AMBULATORY_CARE_PROVIDER_SITE_OTHER): Payer: Medicare Other | Admitting: Family Medicine

## 2012-07-22 ENCOUNTER — Encounter: Payer: Self-pay | Admitting: Family Medicine

## 2012-07-22 VITALS — BP 117/56 | HR 81 | Temp 98.3°F | Ht 64.0 in | Wt 119.0 lb

## 2012-07-22 DIAGNOSIS — F17200 Nicotine dependence, unspecified, uncomplicated: Secondary | ICD-10-CM

## 2012-07-22 DIAGNOSIS — M545 Low back pain, unspecified: Secondary | ICD-10-CM | POA: Diagnosis not present

## 2012-07-22 DIAGNOSIS — R1900 Intra-abdominal and pelvic swelling, mass and lump, unspecified site: Secondary | ICD-10-CM | POA: Insufficient documentation

## 2012-07-22 MED ORDER — POLYETHYLENE GLYCOL 3350 17 GM/SCOOP PO POWD: 17.0000 g | Freq: Two times a day (BID) | ORAL | Status: DC

## 2012-07-22 MED ORDER — VARENICLINE TARTRATE 1 MG PO TABS: 1.0000 mg | ORAL_TABLET | Freq: Two times a day (BID) | ORAL | Status: AC

## 2012-07-22 MED ORDER — CLOBETASOL PROPIONATE 0.05 % EX CREA: TOPICAL_CREAM | Freq: Two times a day (BID) | CUTANEOUS | Status: DC

## 2012-07-22 MED ORDER — HYDROCODONE-ACETAMINOPHEN 10-500 MG PO TABS
1.0000 | ORAL_TABLET | Freq: Three times a day (TID) | ORAL | Status: DC | PRN
Start: 2012-07-22 — End: 2012-08-14
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
## 2012-07-22 NOTE — Patient Instructions (Addendum)
Great to see you today. I will get an [place] to look at the knot on your abdomen. Pick up Lortab and try to take it 2-3 times a day AS NEEDED. Pick up Chantix and use as directed. Call if you have any side effects.

## 2012-07-22 NOTE — Assessment & Plan Note (Signed)
Chronic low back pain.  Will give one month supply of Lortab.  Patient does not ask for early Rx or for increase in dosage.  Paula Duncan well controlled on this regimen.  May consider referral to Pain Clinic at tertiary care center.
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
## 2012-07-22 NOTE — Assessment & Plan Note (Addendum)
Will refill Chantix one month supply.  Patient to call if she experiences any adverse effects.  Follow up in 1 month or PRN.

## 2012-07-29 ENCOUNTER — Telehealth: Payer: Self-pay | Admitting: Family Medicine

## 2012-07-29 ENCOUNTER — Other Ambulatory Visit: Payer: Self-pay | Admitting: Family Medicine

## 2012-07-29 ENCOUNTER — Ambulatory Visit
Admission: RE | Admit: 2012-07-29 | Discharge: 2012-07-29 | Disposition: A | Discharge status: Home / Self Care | Payer: Medicare Other | Source: Ambulatory Visit | Attending: Family Medicine | Admitting: Family Medicine

## 2012-07-29 DIAGNOSIS — R1033 Periumbilical pain: Secondary | ICD-10-CM | POA: Diagnosis not present

## 2012-07-29 DIAGNOSIS — R1900 Intra-abdominal and pelvic swelling, mass and lump, unspecified site: Secondary | ICD-10-CM

## 2012-07-29 NOTE — Telephone Encounter (Signed)
Pt is asking for results of her US done today

## 2012-07-30 NOTE — Telephone Encounter (Signed)
She has a hernia.  If she continues to have symptoms or pain, then she will need an appointment with me so I can refer her to Gen Surgery.  Thanks.

## 2012-07-30 NOTE — Telephone Encounter (Signed)
Pt is calling again - she is anxious

## 2012-08-12 ENCOUNTER — Ambulatory Visit (INDEPENDENT_AMBULATORY_CARE_PROVIDER_SITE_OTHER): Payer: Medicare Other | Admitting: Family Medicine

## 2012-08-12 ENCOUNTER — Encounter: Payer: Self-pay | Admitting: Family Medicine

## 2012-08-12 VITALS — BP 126/66 | HR 102 | Temp 98.2°F | Ht 64.0 in | Wt 114.0 lb

## 2012-08-12 DIAGNOSIS — R1900 Intra-abdominal and pelvic swelling, mass and lump, unspecified site: Secondary | ICD-10-CM

## 2012-08-12 MED ORDER — TRAZODONE HCL 50 MG PO TABS: 100.0000 mg | ORAL_TABLET | Freq: Every evening | ORAL | Status: DC | PRN

## 2012-08-12 MED ORDER — MIRTAZAPINE 15 MG PO TABS: 15.0000 mg | ORAL_TABLET | Freq: Every day | ORAL | Status: DC

## 2012-08-12 MED ORDER — ATENOLOL 25 MG PO TABS: 25.0000 mg | ORAL_TABLET | Freq: Every day | ORAL | Status: DC

## 2012-08-12 NOTE — Patient Instructions (Addendum)
I will refer you to General Surgery for evaluation of hernia. If you develop worsening abdominal pain, nausea/vomiting, or fever, please call your doctor or go to ER. We will should call with time and date of appointment.
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
## 2012-08-14 ENCOUNTER — Encounter: Payer: Self-pay | Admitting: Family Medicine

## 2012-08-14 ENCOUNTER — Other Ambulatory Visit: Payer: Self-pay | Admitting: *Deleted

## 2012-08-14 MED ORDER — HYDROCODONE-ACETAMINOPHEN 10-500 MG PO TABS: 1.0000 | ORAL_TABLET | Freq: Three times a day (TID) | ORAL | Status: DC | PRN

## 2012-08-14 NOTE — Assessment & Plan Note (Signed)
US abdomen concerning for hernia.  She is symptomatic and interested in possible surgical intervention.  No signs of infection or strangulation at this time.  Will refer to General Surgery.  Red flags reviewed per AVS.

## 2012-08-25 ENCOUNTER — Encounter (INDEPENDENT_AMBULATORY_CARE_PROVIDER_SITE_OTHER): Payer: Self-pay | Admitting: General Surgery

## 2012-08-25 ENCOUNTER — Ambulatory Visit (INDEPENDENT_AMBULATORY_CARE_PROVIDER_SITE_OTHER): Payer: Medicare Other | Admitting: General Surgery

## 2012-08-25 VITALS — BP 116/64 | HR 76 | Temp 97.9°F | Resp 16 | Ht 64.0 in | Wt 121.0 lb

## 2012-08-25 DIAGNOSIS — R109 Unspecified abdominal pain: Secondary | ICD-10-CM

## 2012-08-25 NOTE — Patient Instructions (Signed)
Plan for CT. We will call with results

## 2012-08-25 NOTE — Progress Notes (Signed)
Subjective:     Patient ID: Paula Duncan, female   DOB: 1960-06-24, 52 y.o.   MRN: [id number]  HPI We are asked to see the patient in consultation by Dr. Domenick Bookbinder to evaluate her for a ventral hernia. The patient is a 52 year old white female who has had a lot of abdominal surgery before. Over the last year she has felt a painful bulge just below her belly button that she feels is gotten slightly larger. She denies any nausea or vomiting. She is a current smoker and has been on prednisone recently for some wheezing.  Review of Systems  Constitutional: Negative.   HENT: Negative.   Eyes: Negative.   Respiratory: Negative.   Cardiovascular: Negative.   Gastrointestinal: Positive for abdominal pain and constipation. Negative for nausea and vomiting.  Endocrine: Negative.   Genitourinary: Negative.   Musculoskeletal: Negative.   Skin: Negative.   Allergic/Immunologic: Negative.   Neurological: Negative.   Hematological: Negative.   Psychiatric/Behavioral: Negative.        Objective:   Physical Exam  Constitutional: She is oriented to person, place, and time. She appears well-developed and well-nourished.  HENT:  Head: Normocephalic and atraumatic.  Eyes: Conjunctivae and EOM are normal. Pupils are equal, round, and reactive to light.  Neck: Normal range of motion. Neck supple.  Cardiovascular: Normal rate, regular rhythm and normal heart sounds.   Pulmonary/Chest: Effort normal and breath sounds normal.  Abdominal: Soft. Bowel sounds are normal.  There is a palpable gritty feeling abnormality along the midline incision just below the umbilicus but there is not a palpable fascial defect  Musculoskeletal: Normal range of motion.  Neurological: She is alert and oriented to person, place, and time.  Skin: Skin is warm and dry.  Psychiatric: She has a normal mood and affect. Her behavior is normal.       Assessment:     The patient has some lower abdominal pain that is certainly in  the location of a possible ventral hernia given how much abdominal surgery she's had. She is thin but I do not feel a palpable fascial defect. At this point I would recommend a CT scan to look for evidence of a fascial defect consistent with a ventral hernia. if she has one I think she would be a good candidate for possible laparoscopic repair. I have discussed with her in detail the risks and benefits of the operation to fix the hernia as well as some of the technical aspects including the use of mesh and the risk of mesh infection as well as mechanical ventilation given her smoking history and she understands.    Plan:     Plan for CT of her abdomen and pelvis and then we will call her with the results and proceed accordingly

## 2012-08-26 ENCOUNTER — Ambulatory Visit
Admission: RE | Admit: 2012-08-26 | Discharge: 2012-08-26 | Disposition: A | Discharge status: Home / Self Care | Payer: Medicare Other | Source: Ambulatory Visit | Attending: General Surgery | Admitting: General Surgery

## 2012-08-26 DIAGNOSIS — R109 Unspecified abdominal pain: Secondary | ICD-10-CM

## 2012-08-26 DIAGNOSIS — R935 Abnormal findings on diagnostic imaging of other abdominal regions, including retroperitoneum: Secondary | ICD-10-CM | POA: Diagnosis not present

## 2012-08-26 MED ORDER — IOHEXOL 300 MG/ML  SOLN
100.0000 mL | Freq: Once | INTRAMUSCULAR | Status: AC | PRN
  Administered 2012-08-26: 100 mL via INTRAVENOUS

## 2012-08-27 ENCOUNTER — Telehealth (INDEPENDENT_AMBULATORY_CARE_PROVIDER_SITE_OTHER): Payer: Self-pay

## 2012-08-27 NOTE — Telephone Encounter (Signed)
I called patient to let her know the CT report does not show a hernia but that dr Carolynne Edouard cannot view the images right now. She is concerned that she feels something is there. i told her I would have Dr Carolynne Edouard look at images when they are available to him and we would call her back.

## 2012-08-31 ENCOUNTER — Other Ambulatory Visit: Payer: Self-pay | Admitting: *Deleted

## 2012-08-31 ENCOUNTER — Telehealth (INDEPENDENT_AMBULATORY_CARE_PROVIDER_SITE_OTHER): Payer: Self-pay | Admitting: General Surgery

## 2012-08-31 DIAGNOSIS — G4700 Insomnia, unspecified: Secondary | ICD-10-CM

## 2012-08-31 MED ORDER — ZOLPIDEM TARTRATE 10 MG PO TABS: 10.0000 mg | ORAL_TABLET | Freq: Every evening | ORAL | Status: DC | PRN

## 2012-08-31 NOTE — Telephone Encounter (Signed)
Patient calling to see if Dr Carolynne Edouard has had time to review CT images. Please call patient once they have been reviewed. [phone].

## 2012-09-01 NOTE — Telephone Encounter (Signed)
Called pt with negative CT report. No hernia. Follow up with PCP

## 2012-09-03 ENCOUNTER — Telehealth (INDEPENDENT_AMBULATORY_CARE_PROVIDER_SITE_OTHER): Payer: Self-pay | Admitting: General Surgery

## 2012-09-03 NOTE — Telephone Encounter (Signed)
Pt called to ask question about her CT results.  She was informed the CT showed NO hernia, but asked what it did show instead.  After reviewing the report, pt was informed the CT was negative for any acute process.  Pt again referred to her PCP for further follow-up.  Assured her that her PCP had access to [facility] notes and the CT in Epic.

## 2012-09-08 ENCOUNTER — Ambulatory Visit: Payer: Medicare Other | Admitting: Family Medicine

## 2012-09-17 ENCOUNTER — Telehealth: Payer: Self-pay | Admitting: Family Medicine

## 2012-09-17 NOTE — Telephone Encounter (Signed)
Patient needs a refill on her Hydrocodone, but the insurance will only pay for Norco 10-325 so the pharmacy will need a new prescription.  The patient is asking that the number of pills be made so that is is equivalent to what she was taking before.

## 2012-09-18 ENCOUNTER — Telehealth: Payer: Self-pay | Admitting: Family Medicine

## 2012-09-18 MED ORDER — HYDROCODONE-ACETAMINOPHEN 10-325 MG PO TABS: 1.0000 | ORAL_TABLET | Freq: Three times a day (TID) | ORAL | Status: DC | PRN

## 2012-09-18 MED ORDER — HYDROCODONE-ACETAMINOPHEN 10-500 MG PO TABS: 1.0000 | ORAL_TABLET | Freq: Three times a day (TID) | ORAL | Status: DC | PRN

## 2012-09-18 NOTE — Telephone Encounter (Signed)
The patient is calling back to let Dr. Tye Savoy know that this is time sensitive because the patient has to go to work at 2:00 and her pharmacy closes at 6:00 and has weird hours on the weekend so she really needs this taken care of this morning.

## 2012-09-18 NOTE — Addendum Note (Signed)
Addended by: Jimmy Footman K on: 09/18/2012 12:27 PM   Modules accepted: Orders, Medications

## 2012-09-18 NOTE — Telephone Encounter (Signed)
Please fax Rx to [facility].  Thanks, Alvino Chapel!

## 2012-09-21 ENCOUNTER — Encounter: Payer: Self-pay | Admitting: Family Medicine

## 2012-09-21 ENCOUNTER — Ambulatory Visit (INDEPENDENT_AMBULATORY_CARE_PROVIDER_SITE_OTHER): Payer: Medicare Other | Admitting: Family Medicine

## 2012-09-21 VITALS — BP 121/62 | HR 80 | Temp 99.0°F | Ht 64.0 in | Wt 122.0 lb

## 2012-09-21 DIAGNOSIS — R1900 Intra-abdominal and pelvic swelling, mass and lump, unspecified site: Secondary | ICD-10-CM

## 2012-09-21 DIAGNOSIS — R609 Edema, unspecified: Secondary | ICD-10-CM | POA: Diagnosis not present
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
## 2012-09-21 NOTE — Assessment & Plan Note (Signed)
US revealed hernia, but CT was negative for hernia.  Reassured patient.
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
## 2012-10-16 ENCOUNTER — Telehealth: Payer: Self-pay | Admitting: Family Medicine

## 2012-10-16 MED ORDER — HYDROCODONE-ACETAMINOPHEN 10-325 MG PO TABS: 1.0000 | ORAL_TABLET | Freq: Three times a day (TID) | ORAL | Status: DC | PRN

## 2012-10-16 NOTE — Telephone Encounter (Signed)
Faxed Rx for Percocet today.

## 2012-10-23 ENCOUNTER — Other Ambulatory Visit: Payer: Self-pay | Admitting: *Deleted

## 2012-10-23 DIAGNOSIS — M545 Low back pain: Secondary | ICD-10-CM

## 2012-10-26 ENCOUNTER — Other Ambulatory Visit: Payer: Self-pay | Admitting: *Deleted

## 2012-10-26 DIAGNOSIS — G4700 Insomnia, unspecified: Secondary | ICD-10-CM

## 2012-10-26 MED ORDER — ZOLPIDEM TARTRATE 10 MG PO TABS: 10.0000 mg | ORAL_TABLET | Freq: Every evening | ORAL | Status: DC | PRN

## 2012-10-26 NOTE — Telephone Encounter (Signed)
Will fax Rx to [facility] today.

## 2012-10-26 NOTE — Telephone Encounter (Signed)
Will forward to PCP 

## 2012-10-27 ENCOUNTER — Other Ambulatory Visit: Payer: Self-pay | Admitting: *Deleted

## 2012-10-27 DIAGNOSIS — M545 Low back pain: Secondary | ICD-10-CM

## 2012-10-27 NOTE — Telephone Encounter (Signed)
Will FWD to PCP.  Thompson Mckim L, CMA  

## 2012-10-27 NOTE — Telephone Encounter (Signed)
Patient is calling to get a refill on Orphenadrine 100mg . Her other medication for Ambien 10mg  was called in.

## 2012-10-28 ENCOUNTER — Telehealth: Payer: Self-pay | Admitting: Family Medicine

## 2012-10-28 NOTE — Telephone Encounter (Signed)
This medication was last prescribed by another physician, so she will need to make an appointment to discuss need for this medication.  Thanks.

## 2012-10-28 NOTE — Telephone Encounter (Signed)
Pt says the pharmacy says they dont have her presription for norflex. Records show it was sent to the phamacy yesterday Please advise

## 2012-10-28 NOTE — Telephone Encounter (Signed)
Verbally refilled norflex - pharmacy states that they have not received it via phone, escribe or fax Wyatt Haste, RN-BSN

## 2012-10-28 NOTE — Telephone Encounter (Signed)
Called pharmacy and denied prescriptions per MD.  Pt needs OV.  Paula Duncan, Darlyne Russian, CMA

## 2012-11-12 ENCOUNTER — Other Ambulatory Visit: Payer: Self-pay | Admitting: *Deleted

## 2012-11-13 ENCOUNTER — Telehealth: Payer: Self-pay | Admitting: Family Medicine

## 2012-11-13 MED ORDER — HYDROCODONE-ACETAMINOPHEN 10-325 MG PO TABS: 1.0000 | ORAL_TABLET | Freq: Three times a day (TID) | ORAL | Status: DC | PRN

## 2012-11-13 NOTE — Telephone Encounter (Signed)
rx was called in and pt was notified. Aveleen Nevers S  

## 2012-11-13 NOTE — Telephone Encounter (Signed)
Rx ready for pick up.  [name] Paulina Fusi, DO of [facility] 11/13/2012, 1:38 PM

## 2012-11-13 NOTE — Telephone Encounter (Signed)
Pt is requesting that we re-fax her prescription for Hydrocodone to the pharmacy. She just called them and it is still not there. JW

## 2012-11-16 ENCOUNTER — Encounter: Payer: Self-pay | Admitting: *Deleted

## 2012-11-16 NOTE — Telephone Encounter (Deleted)
Pt believes

## 2012-11-23 ENCOUNTER — Other Ambulatory Visit: Payer: Self-pay | Admitting: *Deleted

## 2012-11-23 DIAGNOSIS — G4700 Insomnia, unspecified: Secondary | ICD-10-CM

## 2012-11-23 NOTE — Telephone Encounter (Signed)
This encounter was created in error - please disregard.

## 2012-11-24 ENCOUNTER — Telehealth: Payer: Self-pay | Admitting: Family Medicine

## 2012-11-24 NOTE — Telephone Encounter (Signed)
Will fwd to md.  Paula Duncan L, CMA  

## 2012-11-24 NOTE — Telephone Encounter (Signed)
Pt is requesting a refill on her Ambiem 10 mg sent to her pharmacy on file. JW

## 2012-11-25 ENCOUNTER — Telehealth: Payer: Self-pay | Admitting: Family Medicine

## 2012-11-25 NOTE — Telephone Encounter (Signed)
Pt calling extremely upset " I need my Ambien, They are never supposed to mess with my sleep medicine - I have been on it for years" Explained that this med is to be taken on a as needed basis not every day and an appointment would have to be made for pt to continue on med. Pt verbalized understanding and appointment made for next Tues ( only day pt can come) at 1030. Wyatt Haste, RN-BSN

## 2012-11-25 NOTE — Telephone Encounter (Signed)
Please advise and I will let pt know what we can do. Wyatt Haste, RN-BSN

## 2012-11-25 NOTE — Telephone Encounter (Signed)
I went back through Paula Duncan's chart.  There is currently no medical indication for her to be receiving ambien, as it looks like Dr. Ashley Royalty Rx this medication back in Jan/Feb for a one time usage.  If she is continuing to have insomnia, she can make an appointment to discuss this, but I do not Rx ambien for long term use and especially if I have never seen the patient in the past.  Thanks, Judie Grieve

## 2012-11-25 NOTE — Telephone Encounter (Signed)
Again, I reiterate that I will not Rx Ambien long term for insomnia as there is no medical indication for long term ambien use.  While her last PCP may have Rx this for her, I will not do this for the long run.  She will need sleep hygiene, stimulus control, CBT (as she has been recommended for in the past w/o going b/c she didn't want to) and if these do not work, eventual evaluation at sleep center.    Thanks, [name]. Paulina Fusi, DO of [facility] 11/25/2012, 5:22 PM

## 2012-11-25 NOTE — Telephone Encounter (Signed)
Pt has called back again. She has schedule an appointment for 7/29, but she really needs a few pills to get her through until then. Please call her and let her know. JW

## 2012-11-26 NOTE — Telephone Encounter (Signed)
Returned call to pt . "i have tried all that stuff - the dr. I went to is dead and he said I don't rest right but if he don't want to give me that then I will just switch doctors" Informed pt that she would have to come into the office and fill out form stating why she needed a change in doctor and there was a strong possibility that no doctor would prescribe Ambien on a long term basis. Pt responded that she would be at appointment on Tuesday. Wyatt Haste, RN-BSN

## 2012-12-01 ENCOUNTER — Encounter: Payer: Self-pay | Admitting: Family Medicine

## 2012-12-01 ENCOUNTER — Ambulatory Visit (INDEPENDENT_AMBULATORY_CARE_PROVIDER_SITE_OTHER): Payer: Medicare Other | Admitting: Family Medicine

## 2012-12-01 VITALS — BP 144/74 | HR 79 | Ht 64.0 in | Wt 116.0 lb

## 2012-12-01 DIAGNOSIS — G4700 Insomnia, unspecified: Secondary | ICD-10-CM | POA: Diagnosis not present

## 2012-12-01 MED ORDER — ZOLPIDEM TARTRATE 10 MG PO TABS: 5.0000 mg | ORAL_TABLET | Freq: Every evening | ORAL | Status: DC | PRN

## 2012-12-01 MED ORDER — TRAZODONE HCL 50 MG PO TABS: 25.0000 mg | ORAL_TABLET | Freq: Every evening | ORAL | Status: DC | PRN
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
## 2012-12-09 ENCOUNTER — Other Ambulatory Visit: Payer: Self-pay | Admitting: *Deleted

## 2012-12-09 MED ORDER — ATENOLOL 25 MG PO TABS: 25.0000 mg | ORAL_TABLET | Freq: Every day | ORAL | Status: DC

## 2012-12-16 ENCOUNTER — Telehealth: Payer: Self-pay | Admitting: Family Medicine

## 2012-12-16 MED ORDER — HYDROCODONE-ACETAMINOPHEN 10-325 MG PO TABS: 1.0000 | ORAL_TABLET | Freq: Three times a day (TID) | ORAL | Status: DC | PRN

## 2012-12-16 NOTE — Telephone Encounter (Signed)
Patient is calling because she says her pharmacy faxed over a refill request for her Hydrocodone yesterday.  She is out of this medication and is asking for it to be filled today.

## 2012-12-16 NOTE — Telephone Encounter (Addendum)
Will fwd to MD.  Radene Ou, CMA   Rx is ready for pick up or she can pick it up at her appointment on Fri.  Thanks, [name]. Paulina Fusi, DO of [facility] 12/16/2012, 3:12 PM

## 2012-12-18 ENCOUNTER — Encounter: Payer: Self-pay | Admitting: Family Medicine

## 2012-12-18 ENCOUNTER — Ambulatory Visit (INDEPENDENT_AMBULATORY_CARE_PROVIDER_SITE_OTHER): Payer: Medicare Other | Admitting: Family Medicine

## 2012-12-18 VITALS — BP 121/78 | HR 69 | Ht 64.0 in | Wt 119.0 lb

## 2012-12-18 DIAGNOSIS — M545 Low back pain, unspecified: Secondary | ICD-10-CM | POA: Diagnosis not present

## 2012-12-18 DIAGNOSIS — G4700 Insomnia, unspecified: Secondary | ICD-10-CM | POA: Diagnosis not present

## 2012-12-18 MED ORDER — ORPHENADRINE CITRATE ER 100 MG PO TB12: 100.0000 mg | ORAL_TABLET | Freq: Three times a day (TID) | ORAL | Status: DC | PRN

## 2012-12-18 MED ORDER — ZOLPIDEM TARTRATE 5 MG PO TABS: 5.0000 mg | ORAL_TABLET | Freq: Every evening | ORAL | Status: DC | PRN
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
## 2012-12-18 NOTE — Assessment & Plan Note (Addendum)
Continues to have chronic low back pain.  However, starting to have new sciatic type Sx B/L over the last 2-3 weeks.  Will go ahead and repeat Lumbar films, send back to PT for re-evaluation and also to give her core strengthening exercises.  She may need further imaging in the future including MRI and consider sending back to neurosurgery/orthopaedic spine surgeon for further evaluation/recommendations.  Refill of Percocet done on 12/16/12 and consider starting gabapentin 300 mg qd-tid at next visit.

## 2012-12-18 NOTE — Patient Instructions (Addendum)
Matti, it was nice meeting you today.  Please go back to physical therapy for 1-2 sessions for evaluation and to help for core strengthening.  We will get repeat X-rays of your back as well and see if anything new is there.  We will see you back in 3-4 weeks to see how you are doing.    [name] Paulina Fusi, DO of [facility] 12/18/2012, 10:18 AM
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
## 2012-12-21 ENCOUNTER — Telehealth: Payer: Self-pay | Admitting: Family Medicine

## 2012-12-21 NOTE — Telephone Encounter (Signed)
The patient is calling because she was taking 10mg  of Ambien and she noticed that at her last appt, 5mg  was prescribed and she isn't sure why there was a change or if this was a mistake.  Should she take 2 and then expect a correction?

## 2012-12-21 NOTE — Telephone Encounter (Signed)
Forward to PCP to clarify dose

## 2012-12-22 NOTE — Telephone Encounter (Signed)
I called [name] back in regards to her Paula Duncan and left a message on her voicemail to call us back.  Unfortunately, the FDA has changed their dose recommendations to 5 mg for elderly and females for ambien.  I can no longer Rx 10 mg for her due to the federal changes of the labeling of the drug.  I'll be happy to discuss this with her if she needs further clarification.    Thanks, [name]. Paulina Fusi, DO of [facility] 12/22/2012, 1:38 PM

## 2013-01-06 ENCOUNTER — Other Ambulatory Visit: Payer: Self-pay | Admitting: *Deleted

## 2013-01-06 MED ORDER — TRAZODONE HCL 50 MG PO TABS: 25.0000 mg | ORAL_TABLET | Freq: Every evening | ORAL | Status: DC | PRN

## 2013-01-11 ENCOUNTER — Telehealth: Payer: Self-pay | Admitting: Family Medicine

## 2013-01-11 NOTE — Telephone Encounter (Signed)
LMOM advising pt to go and get xrays done, order in system and that Dr Paulina Fusi will deal will med refill when we get it.

## 2013-01-11 NOTE — Telephone Encounter (Signed)
Pt needs referral for back xrays.  They were recommended on her last visit on Aug 15. Please advise  She needs cough medicine refilled.states the pharmacy will be faxing the request today

## 2013-01-12 ENCOUNTER — Ambulatory Visit
Admission: RE | Admit: 2013-01-12 | Discharge: 2013-01-12 | Disposition: A | Discharge status: Home / Self Care | Payer: Medicare Other | Source: Ambulatory Visit | Attending: Family Medicine | Admitting: Family Medicine

## 2013-01-12 DIAGNOSIS — M5137 Other intervertebral disc degeneration, lumbosacral region: Secondary | ICD-10-CM | POA: Diagnosis not present

## 2013-01-12 DIAGNOSIS — M545 Low back pain: Secondary | ICD-10-CM

## 2013-01-15 ENCOUNTER — Telehealth: Payer: Self-pay | Admitting: Family Medicine

## 2013-01-15 NOTE — Telephone Encounter (Signed)
Pt called and would like a nurse or doctor to call her with the results of her back x-rays. JW

## 2013-01-18 ENCOUNTER — Telehealth: Payer: Self-pay | Admitting: Family Medicine

## 2013-01-18 ENCOUNTER — Other Ambulatory Visit: Payer: Self-pay | Admitting: Family Medicine

## 2013-01-18 NOTE — Telephone Encounter (Signed)
Pt is requesting a refill on her hydrocodone be sent to the pharmacy for pickup. JW

## 2013-01-18 NOTE — Telephone Encounter (Signed)
Dr Paulina Fusi patient never picked up rx from 12/16/2012 with 2 refills,I  call prescription in as directed  and discarded the wtriiten rx..No new prescription needed for the next 3 months.patient was also informed that prescription was called in.she voiced understanding. Duanne Duchesne, [name]

## 2013-01-18 NOTE — Telephone Encounter (Signed)
Patient states she doesn't drive and has a hard time getting here, wants to know if rx can either be called or faxed in. Will forward to MD

## 2013-01-18 NOTE — Telephone Encounter (Signed)
Controlled Substance that will need to be picked up.  I will be in clinic on Wednesday and will have the Rx for her to pick up at that time.  Thanks, [name]. Paulina Fusi, DO of [facility] 01/18/2013, 1:44 PM

## 2013-01-18 NOTE — Telephone Encounter (Signed)
Spoke with patient and informed her of below 

## 2013-01-18 NOTE — Telephone Encounter (Signed)
Please let Paula Duncan know that I have sent a letter to her and that she should be receiving this in the mail shortly.  As well, her x-rays show stable degenerative changes and we will discuss her results and further plan at her next visit on 9/29.  Thanks, [name]. Kasheena Sambrano, DO of [facility] 01/18/2013, 7:09 AM

## 2013-01-31 ENCOUNTER — Other Ambulatory Visit: Payer: Self-pay | Admitting: Family Medicine

## 2013-02-01 ENCOUNTER — Ambulatory Visit (INDEPENDENT_AMBULATORY_CARE_PROVIDER_SITE_OTHER): Payer: Medicare Other | Admitting: Family Medicine

## 2013-02-01 ENCOUNTER — Encounter: Payer: Self-pay | Admitting: Family Medicine

## 2013-02-01 VITALS — BP 135/54 | HR 70 | Temp 99.5°F | Ht 64.0 in | Wt 121.0 lb

## 2013-02-01 DIAGNOSIS — H5210 Myopia, unspecified eye: Secondary | ICD-10-CM

## 2013-02-01 DIAGNOSIS — J449 Chronic obstructive pulmonary disease, unspecified: Secondary | ICD-10-CM | POA: Diagnosis not present

## 2013-02-01 DIAGNOSIS — J4489 Other specified chronic obstructive pulmonary disease: Secondary | ICD-10-CM

## 2013-02-01 DIAGNOSIS — M545 Low back pain, unspecified: Secondary | ICD-10-CM | POA: Diagnosis not present

## 2013-02-01 DIAGNOSIS — R059 Cough, unspecified: Secondary | ICD-10-CM | POA: Diagnosis not present

## 2013-02-01 DIAGNOSIS — R05 Cough: Secondary | ICD-10-CM

## 2013-02-01 MED ORDER — ALBUTEROL SULFATE HFA 108 (90 BASE) MCG/ACT IN AERS: 2.0000 | INHALATION_SPRAY | Freq: Four times a day (QID) | RESPIRATORY_TRACT | Status: DC | PRN

## 2013-02-01 MED ORDER — BENZONATATE 200 MG PO CAPS: 200.0000 mg | ORAL_CAPSULE | Freq: Three times a day (TID) | ORAL | Status: DC | PRN

## 2013-02-01 MED ORDER — PREDNISONE 50 MG PO TABS: 50.0000 mg | ORAL_TABLET | Freq: Every day | ORAL | Status: DC

## 2013-02-01 MED ORDER — GUAIFENESIN-CODEINE 100-10 MG/5ML PO SYRP: 5.0000 mL | ORAL_SOLUTION | Freq: Three times a day (TID) | ORAL | Status: DC | PRN
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
## 2013-02-01 NOTE — Assessment & Plan Note (Signed)
Referral to optho in [place], [place], faxed in.

## 2013-02-01 NOTE — Patient Instructions (Signed)
Paula Duncan, it was nice seeing you today.  Please make a follow up appointment with Dr. Raymondo Band to schedule a test for your lungs.  As well, please call physical therapy at [phone] to schedule some physical therapy for your back for 2-3 weeks and then they will give you exercises to take home.  At that time, I will see you back in 4-6 weeks to see how you are doing.  As well a referral to your ophthalmologist will be faxed in.   Thanks, Dr. Paulina Fusi

## 2013-02-01 NOTE — Assessment & Plan Note (Signed)
Refilled both tessalon and Robitussin AC today.  Stable, no increase or sputum production.

## 2013-02-01 NOTE — Assessment & Plan Note (Signed)
Pt with some wheezing on exam today, will refill albuterol and use 2 puffs every 4-6 hrs for the next 4-5 days.  Will tx with prednisone as well x 5 day burst.  F/U early next week or if O2 saturations drop below 90% on home pulse ox.  As well, will give card to f/u with Dr. Raymondo Band to repeat PFT's as last ones in December 2012.  If meets classificiation of Gold II or above, will start spiriva and adjust accordingly.  F/U PRN for this problem or early next week if Sx do not improve.  Would consider addition of doxy at that time for possible infectious process.

## 2013-02-01 NOTE — Assessment & Plan Note (Signed)
Low back pain stable, currently 7/10 with no red flags including bowel/bladder incontinence, awakenings at night, fever, chills, or weight loss.  Will try PT for 2-3 weeks and f/u in 4-6 weeks.  If no improvement, will consider to get lumbar MRI to evaluate Spinal Stenosis vs disc herniation causing nerve impingement and possible referral to Neurosurgery.

## 2013-02-08 ENCOUNTER — Ambulatory Visit (INDEPENDENT_AMBULATORY_CARE_PROVIDER_SITE_OTHER): Payer: Medicare Other | Admitting: Pharmacist

## 2013-02-08 ENCOUNTER — Encounter: Payer: Self-pay | Admitting: Pharmacist

## 2013-02-08 VITALS — Ht 64.0 in | Wt 121.0 lb

## 2013-02-08 DIAGNOSIS — Z87891 Personal history of nicotine dependence: Secondary | ICD-10-CM | POA: Diagnosis not present

## 2013-02-08 DIAGNOSIS — R05 Cough: Secondary | ICD-10-CM

## 2013-02-08 DIAGNOSIS — R059 Cough, unspecified: Secondary | ICD-10-CM | POA: Diagnosis not present

## 2013-02-08 NOTE — Progress Notes (Addendum)
S:    Patient arrives in good spirits however she states that she believes here sysmptoms are worse than last visit 1 week ago.  She presents for lung function evaluation.   Sh has previously been evaluated with near normal PFTs.  Patient reports breathing has been worse since the change in "weather".    O: See "scanned report" or Documentation Flowsheet (discrete results - PFTs) for Spirometry results. Patient provided good effort while attempting spirometry.  However coughing may make results less than personal best.    Lung Function Exam:  Per Dr. Deirdre Priest.   A/P: Spirometry evaluation with Pre and Post Bronchodilator reveals near normal lung function.   Patient has been experiencing symptoms for > 1 week and believes she is breathing worse at times.   She has been coughing and feeling short of breath since the change in weather.   She is taking albuterol MDI AND using cough syrup.   no change in treatment plan at this time.    Reviewed results of pulmonary function tests.  Pt verbalized understanding of results and education.  Written pt instructions provided.  F/U Clinic visit PRN and with Dr. Paulina Fusi in 1-2 weeks.   Total time in face to face counseling 35  minutes.  Patient seen with  Morrell Riddle, PharmD Candidate and Piedad Climes, PharmD Resident.   NOTE: Patient quit smoking 02/2011    Problem list for tobacco abuse change to past hx of tobacco abuse today.   Reinforced need for continued abstinence.    Examined Patient.  She was in no distress.  Lung exam showed good air movement with scant wheezing, no rales.  Patient did have dry cough during exam

## 2013-02-08 NOTE — Assessment & Plan Note (Signed)
Spirometry evaluation with Pre and Post Bronchodilator reveals near normal lung function.   Patient has been experiencing symptoms for > 1 week and believes she is breathing worse at times.   She has been coughing and feeling short of breath since the change in weather.   She is taking albuterol MDI AND using cough syrup.   no change in treatment plan at this time.    Reviewed results of pulmonary function tests.  Pt verbalized understanding of results and education.  Written pt instructions provided.  F/U Clinic visit PRN and with Dr. Paulina Fusi in 1-2 weeks.   Total time in face to face counseling 35  minutes.  Patient seen with  Morrell Riddle, PharmD Candidate and Piedad Climes, PharmD Resident.

## 2013-02-08 NOTE — Patient Instructions (Addendum)
Lung Function Tests were near normal -  Better than anticipated but we still need to do a repeat when you are feeling better.   Use your albuterol (ventolin inhaler) AND cough medicine as needed.     Please stay away from cigarettes!!!    Keep appointment with Dr. Paulina Fusi in 1-2 weeks!  Call our office if your breathing is worse.

## 2013-02-09 NOTE — Progress Notes (Signed)
Patient ID: Paula Duncan, female   DOB: June 15, 1960, 52 y.o.   MRN: [id number] Reviewed: Agree with Dr. Macky Lower management and documentation.

## 2013-02-16 ENCOUNTER — Encounter: Payer: Self-pay | Admitting: Family Medicine

## 2013-02-16 ENCOUNTER — Ambulatory Visit (HOSPITAL_COMMUNITY): Payer: Medicare Other

## 2013-02-16 ENCOUNTER — Ambulatory Visit: Payer: Medicare Other | Attending: Family Medicine | Admitting: Physical Therapy

## 2013-02-16 ENCOUNTER — Ambulatory Visit (HOSPITAL_COMMUNITY)
Admission: RE | Admit: 2013-02-16 | Discharge: 2013-02-16 | Disposition: A | Discharge status: Home / Self Care | Payer: Medicare Other | Source: Ambulatory Visit | Attending: Family Medicine | Admitting: Family Medicine

## 2013-02-16 ENCOUNTER — Ambulatory Visit (INDEPENDENT_AMBULATORY_CARE_PROVIDER_SITE_OTHER): Payer: Medicare Other | Admitting: Family Medicine

## 2013-02-16 VITALS — BP 109/50 | HR 78 | Ht 64.0 in | Wt 123.0 lb

## 2013-02-16 DIAGNOSIS — M545 Low back pain, unspecified: Secondary | ICD-10-CM

## 2013-02-16 DIAGNOSIS — IMO0001 Reserved for inherently not codable concepts without codable children: Secondary | ICD-10-CM | POA: Diagnosis not present

## 2013-02-16 DIAGNOSIS — R002 Palpitations: Secondary | ICD-10-CM | POA: Diagnosis not present

## 2013-02-16 DIAGNOSIS — M2560 Stiffness of unspecified joint, not elsewhere classified: Secondary | ICD-10-CM | POA: Insufficient documentation

## 2013-02-16 DIAGNOSIS — G4700 Insomnia, unspecified: Secondary | ICD-10-CM | POA: Diagnosis not present

## 2013-02-16 DIAGNOSIS — R5381 Other malaise: Secondary | ICD-10-CM | POA: Insufficient documentation

## 2013-02-16 DIAGNOSIS — I4949 Other premature depolarization: Secondary | ICD-10-CM | POA: Diagnosis not present

## 2013-02-16 MED ORDER — ZOLPIDEM TARTRATE 5 MG PO TABS: 5.0000 mg | ORAL_TABLET | Freq: Every evening | ORAL | Status: DC | PRN

## 2013-02-16 NOTE — Patient Instructions (Signed)
Paula Duncan, we hope you start to feel better.  If you feel dizzy, please do not stand up.  As well, if you have any of the concerning symptoms below or chest pain, please go immediately to the ED.  Palpitations  A palpitation is the feeling that your heartbeat is irregular or is faster than normal. It may feel like your heart is fluttering or skipping a beat. Palpitations are usually not a serious problem. However, in some cases, you may need further medical evaluation. CAUSES  Palpitations can be caused by:  Smoking.  Caffeine or other stimulants, such as diet pills or energy drinks.  Alcohol.  Stress and anxiety.  Strenuous physical activity.  Fatigue.  Certain medicines.  Heart disease, especially if you have a history of arrhythmias. This includes atrial fibrillation, atrial flutter, or supraventricular tachycardia.  An improperly working pacemaker or defibrillator. DIAGNOSIS  To find the cause of your palpitations, your caregiver will take your history and perform a physical exam. Tests may also be done, including:  Electrocardiography (ECG). This test records the heart's electrical activity.  Cardiac monitoring. This allows your caregiver to monitor your heart rate and rhythm in real time.  Holter monitor. This is a portable device that records your heartbeat and can help diagnose heart arrhythmias. It allows your caregiver to track your heart activity for several days, if needed.  Stress tests by exercise or by giving medicine that makes the heart beat faster. TREATMENT  Treatment of palpitations depends on the cause of your symptoms and can vary greatly. Most cases of palpitations do not require any treatment other than time, relaxation, and monitoring your symptoms. Other causes, such as atrial fibrillation, atrial flutter, or supraventricular tachycardia, usually require further treatment. HOME CARE INSTRUCTIONS   Avoid:  Caffeinated coffee, tea, soft drinks, diet pills,  and energy drinks.  Chocolate.  Alcohol.  Stop smoking if you smoke.  Reduce your stress and anxiety. Things that can help you relax include:  A method that measures bodily functions so you can learn to control them (biofeedback).  Yoga.  Meditation.  Physical activity such as swimming, jogging, or walking.  Get plenty of rest and sleep. SEEK MEDICAL CARE IF:   You continue to have a fast or irregular heartbeat beyond 24 hours.  Your palpitations occur more often. SEEK IMMEDIATE MEDICAL CARE IF:  You develop chest pain or shortness of breath.  You have a severe headache.  You feel dizzy, or you faint.  MAKE SURE YOU:  Understand these instructions.  Will watch your condition.  Will get help right away if you are not doing well or get worse. Document Released: 04/19/2000 Document Revised: 10/22/2011 Document Reviewed: 06/21/2011 [facility] Patient Information 2014 [place], [place].

## 2013-02-16 NOTE — Assessment & Plan Note (Signed)
Low back pain, currently 5/10, no red flags and unchanged from previous exam, history.  She has been unable to go to PT yet, first appointment today.  Will f/u in 4-6 weeks, after she has been able to attend PT for at least 3 weeks.  If no/minimal improvement, consider lumbar MRI to evaluate for spinal stenosis vs disc herniation causing nerve impingement and referral to neurosurgery.

## 2013-02-16 NOTE — Progress Notes (Signed)
Paula Duncan is a 52 y.o. female who presents today for palpitations with dizziness and f/u of LBP.  Palpitations - Pt describing palpitations and episodes of dizziness over the past 4-5 days, happening multiple times per day.  She denies any changes over this time, no recent medications changes, but does note increased stress.  She denies any substernal chest pain, shortness of breath, worsening during activity or relieved by rest.  She does note of dizziness with the palpitations, that is unchanged from head positioning and denies tinnitus.  She does endorse some nausea, but no vomiting, no diaphoresis, no radiating pain down either arm or into the jaw.  She does endorse having this before, about 3-4 yrs ago, at that time she was dx with PVC's and had multiple w/u done by [facility].    LBP - improved slightly since last visit, however, she has been unable to attend PT at this point.  Denies any bowel/bladder incontinence, fevers, chills, weight loss, midline tenderness of her spine.   Past Medical History  Diagnosis Date  . COPD (chronic obstructive pulmonary disease)   . PVC (premature ventricular contraction)   . 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 Anorexia   . Hyperlipidemia     History  Smoking status  . Former Smoker -- 0.25 packs/day  . Types: Cigarettes  . Quit date: 02/04/2011  Smokeless tobacco  . Never Used    Comment: Quit October 2012    Family History  Problem Relation Age of Onset  . Alcohol abuse Mother   . Cancer Father     prostate    Current Outpatient Prescriptions on File Prior to Visit  Medication Sig Dispense Refill  . albuterol (PROVENTIL HFA;VENTOLIN HFA) 108 (90 BASE) MCG/ACT inhaler Inhale 2 puffs into the lungs every 6 (six) hours as needed for wheezing.  1 Inhaler  5  . atenolol (TENORMIN) 25 MG tablet Take 1 tablet (25 mg total) by mouth daily.  30 tablet  3  . benzonatate (TESSALON)  200 MG capsule Take 1 capsule (200 mg total) by mouth 3 (three) times daily as needed for cough.  60 capsule  3  . clobetasol cream (TEMOVATE) 0.05 % Apply topically 2 (two) times daily.  30 g  4  . guaiFENesin-codeine (ROBITUSSIN AC) 100-10 MG/5ML syrup Take 5 mLs by mouth 3 (three) times daily as needed for cough.  240 mL  1  . HYDROcodone-acetaminophen (NORCO) 10-325 MG per tablet Take 1 tablet by mouth every 8 (eight) hours as needed for pain.  90 tablet  2  . mirtazapine (REMERON) 15 MG tablet Take 1 tablet (15 mg total) by mouth at bedtime.  30 tablet  1  . orphenadrine (NORFLEX) 100 MG tablet Take 1 tablet (100 mg total) by mouth 3 (three) times daily as needed for muscle spasms.  90 tablet  11  . polyethylene glycol powder (GLYCOLAX/MIRALAX) powder Take 17 g by mouth 2 (two) times daily.  3350 g  1  . promethazine (PHENERGAN) 25 MG suppository Place 1 suppository (25 mg total) rectally every 8 (eight) hours as needed for nausea.  12 each  0  . promethazine (PHENERGAN) 25 MG tablet Take 1 tablet (25 mg total) by mouth every 8 (eight) hours as needed for nausea.  20 tablet  0  . traZODone (DESYREL) 50 MG tablet Take 0.5-1 tablets (25-50 mg total) by mouth at bedtime as needed for sleep.  90 tablet  2  . zolpidem (AMBIEN) 5 MG tablet Take 1 tablet (5 mg total) by mouth at bedtime as needed for sleep.  90 tablet  0   No current facility-administered medications on file prior to visit.    ROS: Per HPI.  All other systems reviewed and are negative.   Physical Exam Filed Vitals:   02/16/13 1053  BP: 109/50  Pulse: 78    Physical Examination: General appearance - alert, well appearing, and in no distress Neck - supple, no significant adenopathy, carotids upstroke normal bilaterally, no bruits Chest - clear to auscultation, no wheezes, rales or rhonchi, symmetric air entry Heart - normal rate and regular rhythm, no murmurs noted Extremities - No pedal edema B/L   EKG Ordered and  reviewed by myself today - Findings can be found in A/P under PVC's

## 2013-02-16 NOTE — Assessment & Plan Note (Addendum)
Pt describing palpitations and episodes of dizziness over the past 4-5 days, happening multiple times per day.  She denies any changes over this time, no recent medications changes, but does note increased stress.  She denies any substernal chest pain, shortness of breath, worsening during activity or relieved by rest.  She does note of dizziness with the palpitations, that is unchanged from head positioning and denies tinnitus.  She does endorse some nausea, but no vomiting, no diaphoresis, no radiating pain down either arm or into the jaw.  TIMI of 0, Heart Score of 1, Wells of 0 w/ no pleuritic chest pain or calf tenderness. Most likely PVC's vs arrhythmia vs medication induced secondary to caffeine/albuterol vs possible pyschosomatic, and will get EKG today.  Could consider changing albuterol to xopenex to see if receives relief and we will send referral back to cardiology as her last appointment was over 2 and half yrs ago.  IShe denies any syncopal episodes or history of family members with sudden cardiac death.  Explained red flags which were outlined in the patient instructions as well.   EKG shows rate 75, normal rhythm w/o abnormalities, regular axis, no LVH/RVH, and no STEMI.  My read is NSR, nml EKG, which is unchanged from previous.

## 2013-02-17 ENCOUNTER — Telehealth: Payer: Self-pay | Admitting: Family Medicine

## 2013-02-17 NOTE — Telephone Encounter (Addendum)
Patient was prescribed Ambien yesterday by Paulina Fusi and pharmacy needs to be contacted so that meds can be filled early. Please call pharmacy.     Discussed with pharmacy, will be filled.  Thanks, [name]. Paulina Fusi, DO of [facility] 02/18/2013, 4:59 PM

## 2013-02-18 MED ORDER — PROMETHAZINE HCL 25 MG PO TABS: 25.0000 mg | ORAL_TABLET | Freq: Three times a day (TID) | ORAL | Status: DC | PRN

## 2013-02-22 ENCOUNTER — Ambulatory Visit: Payer: Medicare Other | Admitting: Rehabilitation

## 2013-02-23 ENCOUNTER — Ambulatory Visit: Payer: Medicare Other | Admitting: Rehabilitation

## 2013-03-01 ENCOUNTER — Ambulatory Visit: Payer: Medicare Other | Admitting: Physical Therapy

## 2013-03-02 ENCOUNTER — Ambulatory Visit: Payer: Medicare Other | Admitting: Physical Therapy

## 2013-03-03 ENCOUNTER — Encounter: Payer: Self-pay | Admitting: Cardiology

## 2013-03-03 ENCOUNTER — Ambulatory Visit (INDEPENDENT_AMBULATORY_CARE_PROVIDER_SITE_OTHER): Payer: Medicare Other | Admitting: Cardiology

## 2013-03-03 VITALS — BP 120/88 | HR 72 | Ht 64.0 in | Wt 120.0 lb

## 2013-03-03 DIAGNOSIS — R002 Palpitations: Secondary | ICD-10-CM | POA: Diagnosis not present

## 2013-03-03 DIAGNOSIS — E876 Hypokalemia: Secondary | ICD-10-CM

## 2013-03-03 DIAGNOSIS — R079 Chest pain, unspecified: Secondary | ICD-10-CM | POA: Diagnosis not present

## 2013-03-03 DIAGNOSIS — Z79899 Other long term (current) drug therapy: Secondary | ICD-10-CM | POA: Diagnosis not present

## 2013-03-03 DIAGNOSIS — E785 Hyperlipidemia, unspecified: Secondary | ICD-10-CM

## 2013-03-03 LAB — LIPID PANEL
Cholesterol: 204 mg/dL — ABNORMAL HIGH (ref 0–200)
HDL: 46.5 mg/dL (ref >39.00)
Total CHOL/HDL Ratio: 4
Triglycerides: 172 mg/dL — ABNORMAL HIGH (ref 0.0–149.0)
VLDL: 34.4 mg/dL (ref 0.0–40.0)

## 2013-03-03 LAB — BASIC METABOLIC PANEL
BUN: 8 mg/dL (ref 6–23)
CO2: 28 mEq/L (ref 19–32)
Calcium: 9.3 mg/dL (ref 8.4–10.5)
Chloride: 106 mEq/L (ref 96–112)
Creatinine, Ser: 1 mg/dL (ref 0.4–1.2)
GFR: 61.27 mL/min (ref >60.00)
Glucose, Bld: 81 mg/dL (ref 70–99)
Potassium: 5.2 mEq/L — ABNORMAL HIGH (ref 3.5–5.1)
Sodium: 141 mEq/L (ref 135–145)

## 2013-03-03 LAB — HEPATIC FUNCTION PANEL
ALT: 12 U/L (ref 0–35)
AST: 20 U/L (ref 0–37)
Albumin: 4 g/dL (ref 3.5–5.2)
Alkaline Phosphatase: 114 U/L (ref 39–117)
Bilirubin, Direct: 0 mg/dL (ref 0.0–0.3)
Total Bilirubin: 0.4 mg/dL (ref 0.3–1.2)
Total Protein: 6.6 g/dL (ref 6.0–8.3)

## 2013-03-03 LAB — LDL CHOLESTEROL, DIRECT: Direct LDL: 136.3 mg/dL

## 2013-03-03 LAB — TSH: TSH: 1.07 u[IU]/mL (ref 0.35–5.50)

## 2013-03-03 MED ORDER — ASPIRIN EC 81 MG PO TBEC: 81.0000 mg | DELAYED_RELEASE_TABLET | Freq: Every day | ORAL | Status: DC

## 2013-03-03 NOTE — Progress Notes (Signed)
Patient ID: Paula Duncan, female   DOB: January 01, 1961, 52 y.o.   MRN: [id number]  We will start the patient on atorvastatin 20 mg po QHS as her LDL and TAG are elevated. Her K is 5.2, however it was elevated 2 and 4 years ago. She has normal renal function, there is no medication that can cause this. She is asmptomatic and has no ECG changes. We will repeat at her next clinic visit.  Tobias Alexander, Rexene Edison 03/03/2013

## 2013-03-03 NOTE — Patient Instructions (Signed)
Your physician recommends that you schedule a follow-up appointment in: 1 month (after testing)  Your physician has requested that you have an exercise stress myoview. For further information please visit [email or web address] Please follow instruction sheet, as given.  Your physician has recommended that you wear a holter monitor. Holter monitors are medical devices that record the heart's electrical activity. Doctors most often use these monitors to diagnose arrhythmias. Arrhythmias are problems with the speed or rhythm of the heartbeat. The monitor is a small, portable device. You can wear one while you do your normal daily activities. This is usually used to diagnose what is causing palpitations/syncope (passing out).  Your physician has recommended you make the following change in your medication:  Start aspirin 81 mg by mouth daily   We will call you with the results of lab work done today
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
## 2013-03-03 NOTE — Addendum Note (Signed)
Addended by: Harriet Butte on: 03/03/2013 05:15 PM   Modules accepted: Orders

## 2013-03-05 ENCOUNTER — Telehealth: Payer: Self-pay

## 2013-03-05 ENCOUNTER — Telehealth: Payer: Self-pay | Admitting: Cardiology

## 2013-03-05 MED ORDER — ATORVASTATIN CALCIUM 20 MG PO TABS: 20.0000 mg | ORAL_TABLET | Freq: Every day | ORAL | Status: DC

## 2013-03-05 NOTE — Telephone Encounter (Signed)
I spoke with pt about her lab results. Atorvastatin 20mg  sent to her pharmacy She will come in fasting for December appointment with Dr. Henderson Newcomer RN

## 2013-03-05 NOTE — Addendum Note (Signed)
Addended by: Demetrios Loll on: 03/05/2013 06:29 PM   Modules accepted: Orders

## 2013-03-05 NOTE — Telephone Encounter (Signed)
**Note De-Identified Paula Duncan Obfuscation** We will start the patient on atorvastatin 20 mg po QHS as her LDL and TAG are elevated.  Her K is 5.2, however it was elevated 2 and 4 years ago. She has normal renal function, there is no medication that can cause this. She is asmptomatic and has no ECG changes. We will repeat at her next clinic visit.  Tobias Alexander, H  03/03/2013   Pt advised, she verbalized understanding. Labs scheduled for 12/16 (next OV) and RX sent to pts pharmacy to fill, pt aware.

## 2013-03-05 NOTE — Telephone Encounter (Signed)
Follow Up  Pt returning call for results// Please call

## 2013-03-15 ENCOUNTER — Telehealth: Payer: Self-pay | Admitting: Family Medicine

## 2013-03-15 ENCOUNTER — Encounter: Payer: Self-pay | Admitting: *Deleted

## 2013-03-15 ENCOUNTER — Encounter (INDEPENDENT_AMBULATORY_CARE_PROVIDER_SITE_OTHER): Payer: Medicare Other

## 2013-03-15 DIAGNOSIS — R002 Palpitations: Secondary | ICD-10-CM | POA: Diagnosis not present

## 2013-03-15 NOTE — Progress Notes (Signed)
Patient ID: Paula Duncan, female   DOB: Sep 10, 1960, 52 y.o.   MRN: [id number] E-Cardio 48 hour holter monitor applied to patient.

## 2013-03-15 NOTE — Telephone Encounter (Signed)
Needs miratzapine refilled [facility] pharmacy

## 2013-03-15 NOTE — Telephone Encounter (Signed)
Filled out Fax today, should be faxed and ready by tomorrow.  [name] Paulina Fusi, DO of [facility] 03/15/2013, 1:46 PM

## 2013-03-15 NOTE — Telephone Encounter (Signed)
Unable to reach patient.Tried several time to reach patient, phone has no voicemail. Paula Duncan, [name]

## 2013-03-22 ENCOUNTER — Ambulatory Visit (HOSPITAL_COMMUNITY): Payer: Medicare Other | Attending: Cardiology | Admitting: Radiology

## 2013-03-22 ENCOUNTER — Encounter: Payer: Self-pay | Admitting: Cardiology

## 2013-03-22 VITALS — BP 158/84 | Ht 64.0 in | Wt 124.0 lb

## 2013-03-22 DIAGNOSIS — R42 Dizziness and giddiness: Secondary | ICD-10-CM | POA: Insufficient documentation

## 2013-03-22 DIAGNOSIS — E785 Hyperlipidemia, unspecified: Secondary | ICD-10-CM | POA: Diagnosis not present

## 2013-03-22 DIAGNOSIS — R569 Unspecified convulsions: Secondary | ICD-10-CM | POA: Diagnosis not present

## 2013-03-22 DIAGNOSIS — J4489 Other specified chronic obstructive pulmonary disease: Secondary | ICD-10-CM | POA: Insufficient documentation

## 2013-03-22 DIAGNOSIS — J449 Chronic obstructive pulmonary disease, unspecified: Secondary | ICD-10-CM | POA: Diagnosis not present

## 2013-03-22 DIAGNOSIS — R002 Palpitations: Secondary | ICD-10-CM | POA: Diagnosis not present

## 2013-03-22 DIAGNOSIS — R079 Chest pain, unspecified: Secondary | ICD-10-CM

## 2013-03-22 DIAGNOSIS — R0602 Shortness of breath: Secondary | ICD-10-CM | POA: Diagnosis not present

## 2013-03-22 DIAGNOSIS — Z87891 Personal history of nicotine dependence: Secondary | ICD-10-CM | POA: Diagnosis not present

## 2013-03-22 DIAGNOSIS — I10 Essential (primary) hypertension: Secondary | ICD-10-CM | POA: Insufficient documentation

## 2013-03-22 MED ORDER — TECHNETIUM TC 99M SESTAMIBI GENERIC - CARDIOLITE
11.0000 | Freq: Once | INTRAVENOUS | Status: AC | PRN
  Administered 2013-03-22: 11 via INTRAVENOUS

## 2013-03-22 MED ORDER — TECHNETIUM TC 99M SESTAMIBI GENERIC - CARDIOLITE
33.0000 | Freq: Once | INTRAVENOUS | Status: AC | PRN
  Administered 2013-03-22: 33 via INTRAVENOUS

## 2013-03-22 NOTE — Progress Notes (Signed)
[facility] SITE 3 NUCLEAR MED [address] [place], [place] [address] [phone]    Cardiology Nuclear Med Study  Paula Duncan is a 52 y.o. female     MRN : [id number]     DOB: 12/26/1960  Procedure Date: 03/22/2013  Nuclear Med Background Indication for Stress Test:  Evaluation for Ischemia History:  COPD;ECHO;EF 56%;SEIZURES(last 16 yrs ago/no meds) Cardiac Risk Factors: History of Smoking, Hypertension and Lipids  Symptoms:  Chest Pain, Dizziness, Palpitations and SOB   Nuclear Pre-Procedure Caffeine/Decaff Intake:  None NPO After: 9:00pm   Lungs:  clear O2 Sat: 99% on room air. IV 0.9% NS with Angio Cath:  22g  IV Site: R Hand  IV Started by:  Bonnita Levan, RN  Chest Size (in):  38 Cup Size: B  Height: 5\' 4"  (1.626 m)  Weight:  124 lb (56.246 kg)  BMI:  Body mass index is 21.27 kg/(m^2). Tech Comments:  With held Atenolol 24 hrs    Nuclear Med Study 1 or 2 day study: 1 day  Stress Test Type:  Stress  Reading MD: Tobias Alexander, MD  Order Authorizing Provider:  Tobias Alexander, MD  Resting Radionuclide: Technetium 86m Sestamibi  Resting Radionuclide Dose: 11.0 mCi   Stress Radionuclide:  Technetium 35m Sestamibi  Stress Radionuclide Dose: 32.8 mCi           Stress Protocol Rest HR: 96 Stress HR: 166  Rest BP: 158/84 Stress BP: 196/70  Exercise Time (min): 3:31 METS: 4.70   Predicted Max HR: 166 bpm % Max HR: 100 bpm Rate Pressure Product: 57846   Dose of Adenosine (mg):  n/a Dose of Lexiscan: n/a mg  Dose of Atropine (mg): n/a Dose of Dobutamine: n/a mcg/kg/min (at max HR)  Stress Test Technologist: Frederick Peers, EMT-P  Nuclear Technologist:  Domenic Polite, CNMT     Rest Procedure:  Myocardial perfusion imaging was performed at rest 45 minutes following the intravenous administration of Technetium 7m Sestamibi. Rest ECG: NSR - Normal EKG  Stress Procedure:  The patient exercised on the treadmill utilizing the Bruce Protocol for 3:31  minutes. The patient stopped due to sob/fatigue and denied any chest pain.  Technetium 48m Sestamibi was injected at peak exercise and myocardial perfusion imaging was performed after a brief delay. Stress ECG: No significant change from baseline ECG  QPS Raw Data Images:  Normal; no motion artifact; normal heart/lung ratio. Stress Images:  There is small area, mild severity perfusion defect in the mid and apical inferior wall. Rest Images:  Normal homogeneous uptake in all areas of the myocardium. Subtraction (SDS):  There is a small area mild severity reversible defect in the mid and distal inferior wall.  Transient Ischemic Dilatation (Normal <1.22):  0.90 Lung/Heart Ratio (Normal <0.45):  0.21  Quantitative Gated Spect Images QGS EDV:  60 ml QGS ESV:  24 ml  Impression Exercise Capacity:  Fair exercise capacity. BP Response:  Hypertensive blood pressure response. Clinical Symptoms:  No significant symptoms noted. ECG Impression:  No significant ST segment change suggestive of ischemia. Comparison with Prior Nuclear Study: No images to compare  Overall Impression:  Low risk stress nuclear study with a small, mild reversible defect in the distal PDA. .  LV Ejection Fraction: 60%.  LV Wall Motion:  NL LV Function; NL Wall Motion  Tobias Alexander, Rexene Edison 03/22/2013

## 2013-03-24 ENCOUNTER — Telehealth: Payer: Self-pay | Admitting: Cardiology

## 2013-03-24 NOTE — Telephone Encounter (Signed)
New Problem:  Pt states she would like to hear her recent test results. Please call pt back with results.

## 2013-03-24 NOTE — Telephone Encounter (Signed)
**Note De-Identified Paula Duncan Obfuscation** Pt is advised that her stress test results are not available at this time and that I will call her with results as soon as they become available, she verbalized understanding.

## 2013-03-29 ENCOUNTER — Telehealth: Payer: Self-pay

## 2013-03-29 NOTE — Telephone Encounter (Signed)
Patient called no answer.Left message with boyfriend to have patient call back for monitor results.

## 2013-03-30 ENCOUNTER — Ambulatory Visit (INDEPENDENT_AMBULATORY_CARE_PROVIDER_SITE_OTHER): Payer: Medicare Other | Admitting: Family Medicine

## 2013-03-30 ENCOUNTER — Encounter: Payer: Self-pay | Admitting: Family Medicine

## 2013-03-30 VITALS — BP 139/66 | HR 78 | Temp 98.6°F | Ht 64.0 in | Wt 124.4 lb

## 2013-03-30 DIAGNOSIS — M545 Low back pain, unspecified: Secondary | ICD-10-CM | POA: Diagnosis not present

## 2013-03-30 DIAGNOSIS — I4949 Other premature depolarization: Secondary | ICD-10-CM | POA: Diagnosis not present

## 2013-03-30 MED ORDER — ATENOLOL 25 MG PO TABS: 25.0000 mg | ORAL_TABLET | Freq: Every day | ORAL | Status: DC

## 2013-03-30 MED ORDER — HYDROCODONE-ACETAMINOPHEN 10-325 MG PO TABS: 1.0000 | ORAL_TABLET | Freq: Three times a day (TID) | ORAL | Status: DC | PRN

## 2013-03-30 NOTE — Assessment & Plan Note (Signed)
Pt has seen cardiology now for formal evaluation, had a formal stress echo that showed minimum perfusion deficits, however, due to her ongoing symptoms, Dr. Delton See has recommend PCI next week for evaluation.  Went over procedure and risks and benefits in minor detail with the pt today and she has no formal questions.  She has intermittent episodes of palpitations and are still awaiting the 48 hour holter monitor results.  However, in the meantime, recommend she continue with her current lifestyle, unless she has unstable angina (which Sx were explained to her in detail), and await her cath.  If her cath/holter are negative with her stress echo results, she may benefit from cardiopulmonary rehab.  Will see back in 3-4 weeks after results from the rest of her tests.

## 2013-03-30 NOTE — Assessment & Plan Note (Signed)
Pt Sx pretty much the same since last visit despite PT.  Will further evaluate this after her cardiac w/u has been completed.  Would consider further imaging in possible MRI to evaluate for stenosis vs disc herniation.

## 2013-03-30 NOTE — Progress Notes (Signed)
Paula Duncan is a 52 y.o. female who presents today for f/u for palpitations and LBP.  Palpitations - Unchanged, evaluated by cardiology on 10/29 and had a stress echo that showed some small distal inferior deficits in perfusion.  Awaiting results from her Holter, and continues to have palpitations but no other Sx currently.  Will be going for cath next week for evaluation of her coronaries.    LBP - Unchanged, has done PT with minimal improvement.  Denies any bowel/bladder incontinence, fevers, chills, weight loss, midline tenderness of her spine.     Past Medical History  Diagnosis Date  . COPD (chronic obstructive pulmonary disease)   . PVC (premature ventricular contraction)   . 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 Anorexia   . Hyperlipidemia   . Hx of migraines   . Chronic back pain     History  Smoking status  . Former Smoker -- 0.25 packs/day  . Types: Cigarettes  . Quit date: 02/04/2011  Smokeless tobacco  . Never Used    Comment: Quit October 2012    Family History  Problem Relation Age of Onset  . Alcohol abuse Mother   . Cancer Father     Colon carcinoma, from mets from prostate cancer  . Cirrhosis Mother   . Colon polyps Sister     Current Outpatient Prescriptions on File Prior to Visit  Medication Sig Dispense Refill  . albuterol (PROVENTIL HFA;VENTOLIN HFA) 108 (90 BASE) MCG/ACT inhaler Inhale 2 puffs into the lungs every 6 (six) hours as needed for wheezing.  1 Inhaler  5  . aspirin EC 81 MG tablet Take 1 tablet (81 mg total) by mouth daily.  30 tablet  3  . atenolol (TENORMIN) 25 MG tablet Take 1 tablet (25 mg total) by mouth daily.  30 tablet  3  . atorvastatin (LIPITOR) 20 MG tablet Take 1 tablet (20 mg total) by mouth daily.  90 tablet  3  . benzonatate (TESSALON) 200 MG capsule Take 1 capsule (200 mg total) by mouth 3 (three) times daily as needed for cough.  60 capsule  3  . clobetasol cream  (TEMOVATE) 0.05 % Apply topically 2 (two) times daily.  30 g  4  . guaiFENesin-codeine (ROBITUSSIN AC) 100-10 MG/5ML syrup Take 5 mLs by mouth 3 (three) times daily as needed for cough.  240 mL  1  . HYDROcodone-acetaminophen (NORCO) 10-325 MG per tablet Take 1 tablet by mouth every 8 (eight) hours as needed for pain.  90 tablet  2  . mirtazapine (REMERON) 15 MG tablet Take 1 tablet (15 mg total) by mouth at bedtime.  30 tablet  1  . orphenadrine (NORFLEX) 100 MG tablet Take 1 tablet (100 mg total) by mouth 3 (three) times daily as needed for muscle spasms.  90 tablet  11  . promethazine (PHENERGAN) 25 MG suppository Place 1 suppository (25 mg total) rectally every 8 (eight) hours as needed for nausea.  12 each  0  . promethazine (PHENERGAN) 25 MG tablet Take 1 tablet (25 mg total) by mouth every 8 (eight) hours as needed for nausea.  20 tablet  3  . traZODone (DESYREL) 50 MG tablet Take 0.5-1 tablets (25-50 mg total) by mouth at bedtime as needed for sleep.  90 tablet  2  . zolpidem (AMBIEN) 5 MG tablet Take 1 tablet (5 mg total) by mouth at bedtime as needed for sleep.  90 tablet  0   No current facility-administered medications on file prior to visit.    ROS: Per HPI.  All other systems reviewed and are negative.   Physical Exam Filed Vitals:   03/30/13 1336  BP: 139/66  Pulse: 78  Temp: 98.6 F (37 C)    Physical Examination: Physical Examination: General appearance - alert, well appearing, and in no distress  Neck - supple, no significant adenopathy, carotids upstroke normal bilaterally, no bruits  Chest - clear to auscultation, no wheezes, rales or rhonchi, symmetric air entry  Heart - normal rate and regular rhythm, no murmurs noted  Extremities - No pedal edema B/L    Greater than 50% of the 25 minute visit was spent counseling and managing the patient.

## 2013-03-30 NOTE — Patient Instructions (Signed)
Paula Duncan, it was nice seeing you today.  If you have any questions or concerns, please call us.  Meanwhile, prior to your cardiac test, you should continue with your current lifestyle unless you get severe chest pain or chest pain that does not go away within 20-30 minutes of rest, at that time you should call 911 or the emergency line.  We will see you back in 3-4 weeks.  Thanks, Dr. Paulina Fusi

## 2013-03-31 ENCOUNTER — Telehealth: Payer: Self-pay

## 2013-03-31 DIAGNOSIS — Z7901 Long term (current) use of anticoagulants: Secondary | ICD-10-CM

## 2013-03-31 DIAGNOSIS — I4949 Other premature depolarization: Secondary | ICD-10-CM

## 2013-03-31 DIAGNOSIS — Z01810 Encounter for preprocedural cardiovascular examination: Secondary | ICD-10-CM

## 2013-03-31 NOTE — Telephone Encounter (Signed)
Patient called Dr.Nelson reviewed 48 hr holter monitor which was normal. 

## 2013-03-31 NOTE — Telephone Encounter (Signed)
    Zara, Wendt - 03/22/13 More Detail >>      Lars Masson, MD      Sent: Mon March 29, 2013  5:34 PM      To: Homero Fellers Via, LPN              Result Note      Larita Fife,             Would you schedule this patient for a cardiac catheterization the next week? She also had a normal Holter monitor (only PVCs).             Thank you,             Aris Lot                    ----- Message -----         From: Lars Masson, MD         Sent: 03/28/2013  12:53 PM           To: Lars Masson, MD                  The pt is advised that her heart cath is scheduled for 04/06/13 at 7:30 am with Dr [place], she is to arrive at 5:30 am at the [facility] main entrance of [facility]. Lab work and CXR scheduled to be done at the [place] office on Friday 12/28. The pt verbalized understanding to all information given. I went over all cath instructions with the pt over the phone. See Cath letter in pts chart.                              This patient has a mildly positive stress test but significant symptoms. I need to call her and discuss a cath           Myocardial Perfusion Imaging  Status: Edited Result - FINAL     Visible to patient: This result is viewable by the patient in MyChart.     Next appt: 04/20/2013 at 09:30 AM in Cardiology Delton See, Faustino Congress, MD)     Dx: Chest pain            Notes Recorded by Lars Masson, MD on 03/28/2013 at 12:53 PM  This patient has a mildly positive stress test but significant symptoms. I need to call her and discuss a cath      Last Resulted: 03/23/13  9:28 AM Order Details View Encounter Lab and Collection Details Routing Result History      Results        Scan on 03/23/2013  5:19 PM by Jonetta Osgood [place] : [place] Image and Information - [facility] on 03/23/2013  5:19 PM by Jonetta Osgood [place] : [place] Image and Information - [facility]              Reviewed by List    Lars Masson, MD on 03/28/2013  12:53 PM

## 2013-04-02 ENCOUNTER — Other Ambulatory Visit (INDEPENDENT_AMBULATORY_CARE_PROVIDER_SITE_OTHER): Payer: Medicare Other

## 2013-04-02 ENCOUNTER — Encounter (HOSPITAL_COMMUNITY): Payer: Self-pay | Admitting: Pharmacy Technician

## 2013-04-02 ENCOUNTER — Ambulatory Visit (INDEPENDENT_AMBULATORY_CARE_PROVIDER_SITE_OTHER)
Admission: RE | Admit: 2013-04-02 | Discharge: 2013-04-02 | Disposition: A | Discharge status: Home / Self Care | Payer: Medicare Other | Source: Ambulatory Visit | Attending: Cardiology | Admitting: Cardiology

## 2013-04-02 DIAGNOSIS — Z7901 Long term (current) use of anticoagulants: Secondary | ICD-10-CM

## 2013-04-02 DIAGNOSIS — R918 Other nonspecific abnormal finding of lung field: Secondary | ICD-10-CM | POA: Diagnosis not present

## 2013-04-02 DIAGNOSIS — I4949 Other premature depolarization: Secondary | ICD-10-CM

## 2013-04-02 DIAGNOSIS — Z01810 Encounter for preprocedural cardiovascular examination: Secondary | ICD-10-CM

## 2013-04-02 LAB — CBC WITH DIFFERENTIAL/PLATELET
Basophils Absolute: 0.1 10*3/uL (ref 0.0–0.1)
Basophils Relative: 1.1 % (ref 0.0–3.0)
Eosinophils Absolute: 0.2 10*3/uL (ref 0.0–0.7)
Eosinophils Relative: 2.7 % (ref 0.0–5.0)
HCT: 42.9 % (ref 36.0–46.0)
Hemoglobin: 14.4 g/dL (ref 12.0–15.0)
Lymphocytes Relative: 20.7 % (ref 12.0–46.0)
Lymphs Abs: 1.7 10*3/uL (ref 0.7–4.0)
MCHC: 33.5 g/dL (ref 30.0–36.0)
MCV: 94.8 fl (ref 78.0–100.0)
Monocytes Absolute: 0.5 10*3/uL (ref 0.1–1.0)
Monocytes Relative: 6.3 % (ref 3.0–12.0)
Neutro Abs: 5.6 10*3/uL (ref 1.4–7.7)
Neutrophils Relative %: 69.2 % (ref 43.0–77.0)
Platelets: 260 10*3/uL (ref 150.0–400.0)
RBC: 4.52 Mil/uL (ref 3.87–5.11)
RDW: 14.8 % — ABNORMAL HIGH (ref 11.5–14.6)
WBC: 8.1 10*3/uL (ref 4.5–10.5)

## 2013-04-02 LAB — BASIC METABOLIC PANEL
BUN: 10 mg/dL (ref 6–23)
CO2: 27 mEq/L (ref 19–32)
Calcium: 9.3 mg/dL (ref 8.4–10.5)
Chloride: 106 mEq/L (ref 96–112)
Creatinine, Ser: 1 mg/dL (ref 0.4–1.2)
GFR: 59.87 mL/min — ABNORMAL LOW (ref >60.00)
Glucose, Bld: 76 mg/dL (ref 70–99)
Potassium: 3.9 mEq/L (ref 3.5–5.1)
Sodium: 139 mEq/L (ref 135–145)

## 2013-04-02 LAB — PROTIME-INR
INR: 1 ratio (ref 0.8–1.0)
Prothrombin Time: 10.7 s (ref 10.2–12.4)

## 2013-04-05 ENCOUNTER — Telehealth: Payer: Self-pay | Admitting: Cardiology
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
## 2013-04-06 ENCOUNTER — Ambulatory Visit (HOSPITAL_COMMUNITY)
Admission: RE | Admit: 2013-04-06 | Discharge: 2013-04-06 | Disposition: A | Discharge status: Home / Self Care | Payer: Medicare Other | Source: Ambulatory Visit | Attending: Cardiology | Admitting: Cardiology

## 2013-04-06 ENCOUNTER — Encounter (HOSPITAL_COMMUNITY)
Admission: RE | Disposition: A | Discharge status: Home / Self Care | Payer: Self-pay | Source: Ambulatory Visit | Attending: Cardiology

## 2013-04-06 ENCOUNTER — Encounter (HOSPITAL_COMMUNITY): Payer: Self-pay | Admitting: Nurse Practitioner

## 2013-04-06 DIAGNOSIS — R42 Dizziness and giddiness: Secondary | ICD-10-CM | POA: Insufficient documentation

## 2013-04-06 DIAGNOSIS — Z7982 Long term (current) use of aspirin: Secondary | ICD-10-CM | POA: Diagnosis not present

## 2013-04-06 DIAGNOSIS — R002 Palpitations: Secondary | ICD-10-CM | POA: Insufficient documentation

## 2013-04-06 DIAGNOSIS — R0989 Other specified symptoms and signs involving the circulatory and respiratory systems: Secondary | ICD-10-CM | POA: Insufficient documentation

## 2013-04-06 DIAGNOSIS — J4489 Other specified chronic obstructive pulmonary disease: Secondary | ICD-10-CM | POA: Insufficient documentation

## 2013-04-06 DIAGNOSIS — R0609 Other forms of dyspnea: Secondary | ICD-10-CM | POA: Insufficient documentation

## 2013-04-06 DIAGNOSIS — R9439 Abnormal result of other cardiovascular function study: Secondary | ICD-10-CM | POA: Diagnosis not present

## 2013-04-06 DIAGNOSIS — Z87891 Personal history of nicotine dependence: Secondary | ICD-10-CM | POA: Diagnosis not present

## 2013-04-06 DIAGNOSIS — R079 Chest pain, unspecified: Secondary | ICD-10-CM

## 2013-04-06 DIAGNOSIS — J449 Chronic obstructive pulmonary disease, unspecified: Secondary | ICD-10-CM | POA: Insufficient documentation

## 2013-04-06 DIAGNOSIS — E785 Hyperlipidemia, unspecified: Secondary | ICD-10-CM | POA: Insufficient documentation

## 2013-04-06 HISTORY — DX: Personal history of nicotine dependence: Z87.891

## 2013-04-06 HISTORY — DX: Personal history of other diseases of the nervous system and sense organs: Z86.69

## 2013-04-06 HISTORY — DX: Chest pain, unspecified: R07.9

## 2013-04-06 HISTORY — PX: LEFT HEART CATHETERIZATION WITH CORONARY ANGIOGRAM: SHX5451

## 2013-04-06 HISTORY — DX: Melena: K92.1

## 2013-04-06 SURGERY — LEFT HEART CATHETERIZATION WITH CORONARY ANGIOGRAM
Anesthesia: LOCAL

## 2013-04-06 MED ORDER — ASPIRIN 81 MG PO CHEW
81.0000 mg | CHEWABLE_TABLET | ORAL | Status: AC
  Administered 2013-04-06: 81 mg via ORAL

## 2013-04-06 MED ORDER — SODIUM CHLORIDE 0.9 % IJ SOLN: 3.0000 mL | INTRAMUSCULAR | Status: DC | PRN

## 2013-04-06 MED ORDER — VERAPAMIL HCL 2.5 MG/ML IV SOLN
INTRAVENOUS | Status: AC
  Filled 2013-04-06: qty 2

## 2013-04-06 MED ORDER — MORPHINE SULFATE 2 MG/ML IJ SOLN
INTRAMUSCULAR | Status: AC
  Filled 2013-04-06: qty 1

## 2013-04-06 MED ORDER — ONDANSETRON HCL 4 MG/2ML IJ SOLN: 4.0000 mg | Freq: Four times a day (QID) | INTRAMUSCULAR | Status: DC | PRN

## 2013-04-06 MED ORDER — ACETAMINOPHEN 325 MG PO TABS: 650.0000 mg | ORAL_TABLET | ORAL | Status: DC | PRN

## 2013-04-06 MED ORDER — FENTANYL CITRATE 0.05 MG/ML IJ SOLN
INTRAMUSCULAR | Status: AC
  Filled 2013-04-06: qty 2

## 2013-04-06 MED ORDER — LIDOCAINE HCL (PF) 1 % IJ SOLN
INTRAMUSCULAR | Status: AC
  Filled 2013-04-06: qty 30

## 2013-04-06 MED ORDER — MIDAZOLAM HCL 2 MG/2ML IJ SOLN
INTRAMUSCULAR | Status: AC
  Filled 2013-04-06: qty 2

## 2013-04-06 MED ORDER — SODIUM CHLORIDE 0.9 % IV SOLN: 250.0000 mL | INTRAVENOUS | Status: DC | PRN

## 2013-04-06 MED ORDER — ASPIRIN 81 MG PO CHEW
CHEWABLE_TABLET | ORAL | Status: AC
  Filled 2013-04-06: qty 1

## 2013-04-06 MED ORDER — SODIUM CHLORIDE 0.9 % IV SOLN: INTRAVENOUS | Status: DC

## 2013-04-06 MED ORDER — SODIUM CHLORIDE 0.9 % IV SOLN
1.0000 mL/kg/h | INTRAVENOUS | Status: DC
  Administered 2013-04-06: 1 mL/kg/h via INTRAVENOUS

## 2013-04-06 MED ORDER — HYDROCODONE-ACETAMINOPHEN 5-325 MG PO TABS
1.0000 | ORAL_TABLET | Freq: Once | ORAL | Status: AC
  Administered 2013-04-06: 1 via ORAL
  Filled 2013-04-06: qty 1

## 2013-04-06 MED ORDER — NITROGLYCERIN 0.4 MG/SPRAY TL SOLN
Status: AC
  Filled 2013-04-06: qty 4.9

## 2013-04-06 MED ORDER — MORPHINE SULFATE 2 MG/ML IJ SOLN
2.0000 mg | Freq: Once | INTRAMUSCULAR | Status: AC
  Administered 2013-04-06: 2 mg via INTRAVENOUS

## 2013-04-06 MED ORDER — SODIUM CHLORIDE 0.9 % IJ SOLN: 3.0000 mL | Freq: Two times a day (BID) | INTRAMUSCULAR | Status: DC

## 2013-04-06 MED ORDER — HEPARIN (PORCINE) IN NACL 2-0.9 UNIT/ML-% IJ SOLN
INTRAMUSCULAR | Status: AC
  Filled 2013-04-06: qty 1500

## 2013-04-06 MED ORDER — HEPARIN SODIUM (PORCINE) 1000 UNIT/ML IJ SOLN
INTRAMUSCULAR | Status: AC
  Filled 2013-04-06: qty 1
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
## 2013-04-06 NOTE — CV Procedure (Signed)
    Cardiac Catheterization Procedure Note  Name: Paula Duncan MRN: [id number] DOB: 1961-04-24  Procedure: Left Heart Cath, Selective Coronary Angiography, LV angiography  Indication: 52 yo WF with history of tobacco abuse and hyperlipidemia presents with chest pain. myoview study is abnormal with inferior ischemia.   Procedural Details: The right wrist was prepped, draped, and anesthetized with 1% lidocaine. Using the modified Seldinger technique, a 5 French sheath was introduced into the right radial artery. 3 mg of verapamil was administered through the sheath, weight-based unfractionated heparin was administered intravenously. Standard Judkins catheters were used for selective coronary angiography and left ventriculography. Catheter exchanges were performed over an exchange length guidewire. There were no immediate procedural complications. A TR band was used for radial hemostasis at the completion of the procedure.  The patient was transferred to the post catheterization recovery area for further monitoring.  Procedural Findings: Hemodynamics: AO 186/90 mean 132 mm Hg LV 188/19 mm Hg  Coronary angiography: Coronary dominance: left  Left mainstem: Normal  Left anterior descending (LAD): Normal  Left circumflex (LCx): Normal, dominant  Right coronary artery (RCA): Normal  Left ventriculography: Left ventricular systolic function is normal, LVEF is estimated at 55-65%, there is no significant mitral regurgitation   Final Conclusions:   1. Normal coronary anatomy 2. Normal LV function.  Recommendations: Medical management.  Theron Arista [facility] 04/06/2013, 8:36 AM

## 2013-04-06 NOTE — Interval H&P Note (Signed)
History and Physical Interval Note:  04/06/2013 8:10 AM  Paula Duncan  has presented today for surgery, with the diagnosis of ABN NUC STRESS TEST  The various methods of treatment have been discussed with the patient and family. After consideration of risks, benefits and other options for treatment, the patient has consented to  Procedure(s): LEFT HEART CATHETERIZATION WITH CORONARY ANGIOGRAM (N/A) as a surgical intervention .  The patient's history has been reviewed, patient examined, no change in status, stable for surgery.  I have reviewed the patient's chart and labs.  Questions were answered to the patient's satisfaction.   Cath Lab Visit (complete for each Cath Lab visit)  Clinical Evaluation Leading to the Procedure:   ACS: no  Non-ACS:    Anginal Classification: CCS III  Anti-ischemic medical therapy: Minimal Therapy (1 class of medications)  Non-Invasive Test Results: Low-risk stress test findings: cardiac mortality <1%/year  Prior CABG: No previous CABG        Theron Arista [facility] 04/06/2013 8:11 AM

## 2013-04-06 NOTE — H&P (Signed)
Patient seen and examined and history reviewed. Agree with above findings and plan. 52 yo WF with 6 week history of chest pain c/w class 3 angina. On beta blocker therapy. Nuclear study low risk but abnormal with inferior ischemia. Will proceed with cardiac cath +/- PCI today. The procedure and risks were reviewed including but not limited to death, myocardial infarction, stroke, arrythmias, bleeding, transfusion, emergency surgery, dye allergy, or renal dysfunction. The patient voices understanding and is agreeable to proceed.Paula Duncan  Theron Arista [facility]  04/06/2013 8:08 AM

## 2013-04-14 ENCOUNTER — Encounter: Payer: Self-pay | Admitting: Cardiology

## 2013-04-14 ENCOUNTER — Ambulatory Visit (INDEPENDENT_AMBULATORY_CARE_PROVIDER_SITE_OTHER): Payer: Medicare Other | Admitting: Cardiology

## 2013-04-14 VITALS — BP 132/60 | HR 100 | Ht 64.0 in | Wt 127.0 lb

## 2013-04-14 DIAGNOSIS — I4949 Other premature depolarization: Secondary | ICD-10-CM | POA: Diagnosis not present

## 2013-04-14 DIAGNOSIS — I10 Essential (primary) hypertension: Secondary | ICD-10-CM | POA: Insufficient documentation

## 2013-04-14 DIAGNOSIS — I959 Hypotension, unspecified: Secondary | ICD-10-CM | POA: Insufficient documentation

## 2013-04-14 DIAGNOSIS — E785 Hyperlipidemia, unspecified: Secondary | ICD-10-CM

## 2013-04-14 DIAGNOSIS — Z9189 Other specified personal risk factors, not elsewhere classified: Secondary | ICD-10-CM | POA: Diagnosis not present

## 2013-04-14 DIAGNOSIS — Z7722 Contact with and (suspected) exposure to environmental tobacco smoke (acute) (chronic): Secondary | ICD-10-CM | POA: Insufficient documentation

## 2013-04-14 LAB — COMPREHENSIVE METABOLIC PANEL
ALT: 22 U/L (ref 0–35)
AST: 23 U/L (ref 0–37)
Albumin: 4.4 g/dL (ref 3.5–5.2)
Alkaline Phosphatase: 115 U/L (ref 39–117)
BUN: 19 mg/dL (ref 6–23)
CO2: 26 mEq/L (ref 19–32)
Calcium: 8.9 mg/dL (ref 8.4–10.5)
Chloride: 103 mEq/L (ref 96–112)
Creatinine, Ser: 1.3 mg/dL — ABNORMAL HIGH (ref 0.4–1.2)
GFR: 46.06 mL/min — ABNORMAL LOW (ref >60.00)
Glucose, Bld: 94 mg/dL (ref 70–99)
Potassium: 3.6 mEq/L (ref 3.5–5.1)
Sodium: 139 mEq/L (ref 135–145)
Total Bilirubin: 0.5 mg/dL (ref 0.3–1.2)
Total Protein: 7.5 g/dL (ref 6.0–8.3)

## 2013-04-14 NOTE — Patient Instructions (Signed)
Your physician recommends that you continue on your current medications as directed. Please refer to the Current Medication list given to you today.  Your physician recommends that you return for lab work in: today  Your physician wants you to follow-up in: 1 year. You will receive a reminder letter in the mail two months in advance. If you don't receive a letter, please call our office to schedule the follow-up appointment.    
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
## 2013-04-16 ENCOUNTER — Other Ambulatory Visit: Payer: Self-pay | Admitting: *Deleted

## 2013-04-16 DIAGNOSIS — I10 Essential (primary) hypertension: Secondary | ICD-10-CM

## 2013-04-16 DIAGNOSIS — R748 Abnormal levels of other serum enzymes: Secondary | ICD-10-CM

## 2013-04-20 ENCOUNTER — Ambulatory Visit: Payer: Medicare Other | Admitting: Cardiology

## 2013-04-27 ENCOUNTER — Ambulatory Visit (INDEPENDENT_AMBULATORY_CARE_PROVIDER_SITE_OTHER): Payer: Medicare Other | Admitting: Family Medicine

## 2013-04-27 ENCOUNTER — Encounter: Payer: Self-pay | Admitting: Family Medicine

## 2013-04-27 VITALS — BP 104/50 | HR 93 | Temp 98.8°F | Ht 64.0 in | Wt 126.0 lb

## 2013-04-27 DIAGNOSIS — M545 Low back pain, unspecified: Secondary | ICD-10-CM | POA: Diagnosis not present

## 2013-04-27 DIAGNOSIS — Z0000 Encounter for general adult medical examination without abnormal findings: Secondary | ICD-10-CM

## 2013-04-27 DIAGNOSIS — I4949 Other premature depolarization: Secondary | ICD-10-CM | POA: Diagnosis not present

## 2013-04-27 DIAGNOSIS — J449 Chronic obstructive pulmonary disease, unspecified: Secondary | ICD-10-CM

## 2013-04-27 DIAGNOSIS — G4700 Insomnia, unspecified: Secondary | ICD-10-CM

## 2013-04-27 MED ORDER — GUAIFENESIN-CODEINE 100-10 MG/5ML PO SYRP: 5.0000 mL | ORAL_SOLUTION | Freq: Three times a day (TID) | ORAL | Status: DC | PRN

## 2013-04-27 MED ORDER — HYDROCODONE-ACETAMINOPHEN 10-325 MG PO TABS: 1.0000 | ORAL_TABLET | Freq: Three times a day (TID) | ORAL | Status: DC | PRN

## 2013-04-27 MED ORDER — ZOLPIDEM TARTRATE 5 MG PO TABS: 5.0000 mg | ORAL_TABLET | Freq: Every evening | ORAL | Status: DC | PRN

## 2013-04-27 NOTE — Assessment & Plan Note (Signed)
Information given for mammogram, and told to call the breast center.  As well, pt with hysterectomy previously, unsure if she still has a cervix.  Told her to call her gynecologist to find out and consider pap at next visit if she still does have a cervix.  Denies influenza vaccine today.

## 2013-04-27 NOTE — Patient Instructions (Signed)
Zolpidem tablets What is this medicine? ZOLPIDEM (zole PI dem) is used to treat insomnia. This medicine helps you to fall asleep and sleep through the night. This medicine may be used for other purposes; ask your health care provider or pharmacist if you have questions. COMMON BRAND NAME(S): Ambien What should I tell my health care provider before I take this medicine? They need to know if you have any of these conditions: -depression -history of a drug or alcohol abuse problem -liver disease -lung or breathing disease -suicidal thoughts -an unusual or allergic reaction to zolpidem, other medicines, foods, dyes, or preservatives -pregnant or trying to get pregnant -breast-feeding How should I use this medicine? Take this medicine by mouth with a glass of water. Follow the directions on the prescription label. It is better to take this medicine on an empty stomach and only when you are ready for bed. Do not take your medicine more often than directed. If you have been taking this medicine for several weeks and suddenly stop taking it, you may get unpleasant withdrawal symptoms. Your doctor or health care professional may want to gradually reduce the dose. Do not stop taking this medicine on your own. Always follow your doctor or health care professional's advice. A special MedGuide will be given to you by the pharmacist with each prescription and refill. Be sure to read this information carefully each time. Talk to your pediatrician regarding the use of this medicine in children. Special care may be needed. Overdosage: If you think you have taken too much of this medicine contact a poison control center or emergency room at once. NOTE: This medicine is only for you. Do not share this medicine with others. What if I miss a dose? This does not apply. This medicine should only be taken immediately before going to sleep. Do not take double or extra doses. What may interact with this  medicine? -herbal medicines like kava kava, melatonin, St. John's wort and valerian -medicines for fungal infections like ketoconazole, fluconazole, or itraconazole -medicines for treating depression or other mental problems -other medicines given for sleep -some medicines for Parkinson' s disease or other movement disorders -some medicines used to treat HIV infection or AIDS, like ritonavir This list may not describe all possible interactions. Give your health care provider a list of all the medicines, herbs, non-prescription drugs, or dietary supplements you use. Also tell them if you smoke, drink alcohol, or use illegal drugs. Some items may interact with your medicine. What should I watch for while using this medicine? Visit your doctor or health care professional for regular checks on your progress. Keep a regular sleep schedule by going to bed at about the same time each night. Avoid caffeine-containing drinks in the evening hours. When sleep medicines are used every night for more than a few weeks, they may stop working. Talk to your doctor if you still have trouble sleeping. Do not take this medicine unless you are able to get a full night's sleep before you must be active again. You may not be able to remember things that you do in the hours after you take this medicine. Some people have reported driving, making phone calls, or preparing and eating food while asleep after taking sleep medicine. Take this medicine right before going to sleep. Tell your doctor if you are have any problems with your memory. After you stop taking this medicine, you may have trouble falling asleep. This is called rebound insomnia. This problem usually goes away   on its own after 1 or 2 nights. You may get drowsy or dizzy. Do not drive, use machinery, or do anything that needs mental alertness until you know how this medicine affects you. Do not stand or sit up quickly, especially if you are an older patient. This  reduces the risk of dizzy or fainting spells. Alcohol may interfere with the effect of this medicine. Avoid alcoholic drinks. This medicine may cause a decrease in mental alertness the morning after use, even if you feel that you are fully awake. Tell your doctor if you will need to perform activities requiring full alertness, such as driving, the next morning after you have taken this medicine. If you or your family notice any changes in your behavior, or if you have any unusual or disturbing thoughts, call your doctor right away. What side effects may I notice from receiving this medicine? Side effects that you should report to your doctor or health care professional as soon as possible: -allergic reactions like skin rash, itching or hives, swelling of the face, lips, or tongue -changes in vision -confusion -depressed mood -feeling faint or lightheaded, falls -hallucinations -problems with balance, speaking, walking -restlessness, excitability, or feelings of agitation -unusual activities while asleep like driving, eating, making phone calls Side effects that usually do not require medical attention (report to your doctor or health care professional if they continue or are bothersome): -diarrhea -dizziness, or daytime drowsiness, sometimes called a hangover effect -headache This list may not describe all possible side effects. Call your doctor for medical advice about side effects. You may report side effects to FDA at 1-800-FDA-1088. Where should I keep my medicine? Keep out of the reach of children. This medicine can be abused. Keep your medicine in a safe place to protect it from theft. Do not share this medicine with anyone. Selling or giving away this medicine is dangerous and against the law. Store at room temperature between 20 and 25 degrees C (68 and 77 degrees F). Throw away any unused medicine after the expiration date. NOTE: This sheet is a summary. It may not cover all possible  information. If you have questions about this medicine, talk to your doctor, pharmacist, or health care provider.  2014, Elsevier/Gold Standard. (2012-04-07 16:54:48)  

## 2013-04-27 NOTE — Progress Notes (Signed)
Paula Duncan is a 52 y.o. female who presents today for back f/u and palpitations f/u.  Palpitations - Pt seen by cardiology for Holter and PCI on 04/14/13 with normal appearing coronary anatomy.  Will f/u with [facility] in 1 month.  On Lipitor, Atenolol, and ASA right now for primary prevention and palpitations.  Denies any myalgias, palpitations, edema, HA.   Back Pain - Stable, no new Sx, denies red flags, and has not been able to go to PT secondary to her cardiac w/u.    Past Medical History  Diagnosis Date  . COPD (chronic obstructive pulmonary disease)     a. nl PFT's in 2014  . PVC (premature ventricular contraction)     a. 2012 Holter: sinus tach and pvc's assoc with dizziness.  . 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 bilateral LE paralysis, resolved;  b. neg neuro w/u for sz in 2010  . Anorexia   . Hyperlipidemia   . Hx of migraines   . Chronic back pain   . Chest pain     a. 12/2009 MV ([facility]): Neg;  b. 03/2013 MV: small, mild reversible defect in the distal PDA, EF 60%.  . H/O sleep apnea     a. 2010: sleep study neg for osa/cataplexy/narcolepsy  . Melena     a. nl EGD  . History of tobacco abuse     a. 30 pack year hx, quit 2013.    History  Smoking status  . Former Smoker -- 1.00 packs/day for 30 years  . Types: Cigarettes  . Quit date: 02/04/2011  Smokeless tobacco  . Never Used    Comment: Quit October 2012    Family History  Problem Relation Age of Onset  . Alcohol abuse Mother   . Cancer Father     Colon carcinoma, from mets from prostate cancer  . Cirrhosis Mother   . Colon polyps Sister     Current Outpatient Prescriptions on File Prior to Visit  Medication Sig Dispense Refill  . albuterol (PROVENTIL HFA;VENTOLIN HFA) 108 (90 BASE) MCG/ACT inhaler Inhale 2 puffs into the lungs every 6 (six) hours as needed for wheezing.  1 Inhaler  5  . aspirin EC 81 MG tablet Take 1 tablet (81 mg total) by mouth daily.  30 tablet  3  .  atenolol (TENORMIN) 25 MG tablet Take 1 tablet (25 mg total) by mouth daily.  30 tablet  5  . atorvastatin (LIPITOR) 20 MG tablet Take 1 tablet (20 mg total) by mouth daily.  90 tablet  3  . benzonatate (TESSALON) 200 MG capsule Take 1 capsule (200 mg total) by mouth 3 (three) times daily as needed for cough.  60 capsule  3  . clobetasol cream (TEMOVATE) 0.05 % Apply topically 2 (two) times daily.  30 g  4  . mirtazapine (REMERON) 15 MG tablet Take 1 tablet (15 mg total) by mouth at bedtime.  30 tablet  1  . orphenadrine (NORFLEX) 100 MG tablet Take 1 tablet (100 mg total) by mouth 3 (three) times daily as needed for muscle spasms.  90 tablet  11  . promethazine (PHENERGAN) 25 MG suppository Place 1 suppository (25 mg total) rectally every 8 (eight) hours as needed for nausea.  12 each  0  . promethazine (PHENERGAN) 25 MG tablet Take 1 tablet (25 mg total) by mouth every 8 (eight) hours as needed for nausea.  20 tablet  3  . traZODone (DESYREL)  50 MG tablet Take 0.5-1 tablets (25-50 mg total) by mouth at bedtime as needed for sleep.  90 tablet  2   No current facility-administered medications on file prior to visit.    ROS: Per HPI.  All other systems reviewed and are negative.   Physical Exam Filed Vitals:   04/27/13 1011  BP: 104/50  Pulse: 93  Temp: 98.8 F (37.1 C)    Physical Examination: General appearance - alert, well appearing, and in no distress Chest - clear to auscultation, no wheezes, rales or rhonchi, symmetric air entry Heart - S1 and S2 normal, no murmurs noted, RRR

## 2013-04-27 NOTE — Assessment & Plan Note (Signed)
No changes, has not been going to PT since her cardiac w/u but will be going back.  F/U in three months, as no red flags today including Bowel/bladder dysfunction, new weakness, weight loss, fever, chills, night sweats.  Could consider further imaging in future including MRI to evaluate for Spinal Stenosis vs disc herniation.

## 2013-05-17 ENCOUNTER — Other Ambulatory Visit: Payer: Medicare Other

## 2013-05-18 ENCOUNTER — Telehealth: Payer: Self-pay | Admitting: Family Medicine

## 2013-06-08 ENCOUNTER — Ambulatory Visit (INDEPENDENT_AMBULATORY_CARE_PROVIDER_SITE_OTHER): Payer: Medicare Other | Admitting: Family Medicine

## 2013-06-08 ENCOUNTER — Encounter: Payer: Self-pay | Admitting: Family Medicine

## 2013-06-08 VITALS — BP 138/66 | HR 80 | Temp 98.0°F | Ht 64.0 in | Wt 125.3 lb

## 2013-06-08 DIAGNOSIS — M545 Low back pain, unspecified: Secondary | ICD-10-CM | POA: Diagnosis not present

## 2013-06-08 DIAGNOSIS — M25559 Pain in unspecified hip: Secondary | ICD-10-CM | POA: Diagnosis not present

## 2013-06-08 MED ORDER — HYDROCODONE-ACETAMINOPHEN 10-325 MG PO TABS: 1.0000 | ORAL_TABLET | Freq: Three times a day (TID) | ORAL | Status: DC | PRN

## 2013-06-08 NOTE — Progress Notes (Signed)
Paula Duncan is a 53 y.o. female who presents today for R posterior hip pain.  Located posterior illac spine, lateral to the PSIS, tenderness to palpation in the gluteal region, that began about 5-7 days ago now secondary to too much activity.  She has been using ibuprofen and her vicodin for pain which relieves her pain at rest but is exacerbated with walking.  Pt denies any current bowel/bladder problems, fever, chills, unintentional weight loss, night time awakenings secondary to pain, weakness in one or both legs.  She is not having radicular Sx down either leg and denies any anterior or lateral hip pain in the trochanteric region or hip joint itself.  Denies fever, chills, recent trauma or previous injury.   She has had the area injected prior, about 2 yrs ago, without complications.    Past Medical History  Diagnosis Date  . COPD (chronic obstructive pulmonary disease)     a. nl PFT's in 2014  . PVC (premature ventricular contraction)     a. 2012 Holter: sinus tach and pvc's assoc with dizziness.  . 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 bilateral LE paralysis, resolved;  b. neg neuro w/u for sz in 2010  . Anorexia   . Hyperlipidemia   . Hx of migraines   . Chronic back pain   . Chest pain     a. 12/2009 MV ([facility]): Neg;  b. 03/2013 MV: small, mild reversible defect in the distal PDA, EF 60%.  . H/O sleep apnea     a. 2010: sleep study neg for osa/cataplexy/narcolepsy  . Melena     a. nl EGD  . History of tobacco abuse     a. 30 pack year hx, quit 2013.    History  Smoking status  . Former Smoker -- 1.00 packs/day for 30 years  . Types: Cigarettes  . Quit date: 02/04/2011  Smokeless tobacco  . Never Used    Comment: Quit October 2012    Family History  Problem Relation Age of Onset  . Alcohol abuse Mother   . Cancer Father     Colon carcinoma, from mets from prostate cancer  . Cirrhosis Mother   . Colon polyps Sister     Current Outpatient  Prescriptions on File Prior to Visit  Medication Sig Dispense Refill  . albuterol (PROVENTIL HFA;VENTOLIN HFA) 108 (90 BASE) MCG/ACT inhaler Inhale 2 puffs into the lungs every 6 (six) hours as needed for wheezing.  1 Inhaler  5  . aspirin EC 81 MG tablet Take 1 tablet (81 mg total) by mouth daily.  30 tablet  3  . atenolol (TENORMIN) 25 MG tablet Take 1 tablet (25 mg total) by mouth daily.  30 tablet  5  . atorvastatin (LIPITOR) 20 MG tablet Take 1 tablet (20 mg total) by mouth daily.  90 tablet  3  . benzonatate (TESSALON) 200 MG capsule Take 1 capsule (200 mg total) by mouth 3 (three) times daily as needed for cough.  60 capsule  3  . clobetasol cream (TEMOVATE) 0.05 % Apply topically 2 (two) times daily.  30 g  4  . guaiFENesin-codeine (ROBITUSSIN AC) 100-10 MG/5ML syrup Take 5 mLs by mouth 3 (three) times daily as needed for cough.  240 mL  1  . mirtazapine (REMERON) 15 MG tablet Take 1 tablet (15 mg total) by mouth at bedtime.  30 tablet  1  . orphenadrine (NORFLEX) 100 MG tablet Take 1 tablet (100 mg  total) by mouth 3 (three) times daily as needed for muscle spasms.  90 tablet  11  . promethazine (PHENERGAN) 25 MG suppository Place 1 suppository (25 mg total) rectally every 8 (eight) hours as needed for nausea.  12 each  0  . promethazine (PHENERGAN) 25 MG tablet Take 1 tablet (25 mg total) by mouth every 8 (eight) hours as needed for nausea.  20 tablet  3  . traZODone (DESYREL) 50 MG tablet Take 0.5-1 tablets (25-50 mg total) by mouth at bedtime as needed for sleep.  90 tablet  2  . zolpidem (AMBIEN) 5 MG tablet Take 1 tablet (5 mg total) by mouth at bedtime as needed for sleep.  90 tablet  0   No current facility-administered medications on file prior to visit.    ROS: Per HPI.  All other systems reviewed and are negative.   Physical Exam Filed Vitals:   06/08/13 1010  BP: 138/66  Pulse: 80  Temp: 98 F (36.7 C)   Hip: ROM IR: 80 Deg, ER: 80 Deg, Flexion: 120 Deg, Extension:  100 Deg, Abduction: 45 Deg, Adduction: 45 Deg Strength IR: 5/5, ER: 5/5, Flexion: 5/5, Extension: 5/5, Abduction: 5/5, Adduction: 5/5 Pelvic alignment unremarkable to inspection and palpation. Standing hip rotation and gait without trendelenburg / unsteadiness. Greater trochanter without tenderness to palpation. No tenderness over greater trochanter. No SI joint tenderness and normal minimal SI movement. + TTP at the posterior illac crest and glut medius on the R side Neurovascular intact distally LE b/l

## 2013-06-08 NOTE — Patient Instructions (Addendum)
Gluteal Strain The muscles in your butt (buttocks) are called gluteal muscles. A gluteal strain means that the muscles are stretched or have small tears. This can cause pain or stiffness in your buttocks. You also might feel pain in your lower back.  Anyone can strain gluteal muscles. However, it happens often to dancers, runners, and other athletes. CAUSES  There are various causes of gluteal strain. They include:   Stretching the gluteal muscles too far.  Putting too much stress on the muscles before they are warmed up.  Overusing the muscles.  Getting hit hard on the gluteal muscles (a bruise or contusion). Gluteal strain is more likely to happen when:  You are working out in cold weather.  You are tired.  You are doing exercise that requires a sudden burst of activity. Sprinting is an example. SYMPTOMS   Pain in the buttocks when moving the leg. Sometimes the pain extends into the lower back.  Tenderness in the buttocks.  Stiffness or weakness of the buttocks.  Bruising. DIAGNOSIS  To decide if you have gluteal strain, a caregiver will probably:  Ask what symptoms you have.  Ask about the location of your pain, when it started, and when it occurs.  Review your overall health.  Physically examine the buttocks muscles.  Have you do some hip motion exercises. They are called range of motion exercises. You will move your leg in specific ways, sometimes with the caregiver pushing against your leg. This will show where the pain is coming from.  Order a magnetic resonance imaging (MRI) scan. This machine uses a magnet and a computer to take pictures of your muscles and tendons. This can help find the cause of your pain. It also may show how severe the strain is. Your strain may be rated:  Grade 1 strain (mild): Muscles are stretched. There might be very tiny tears. This type of strain should heal in about a week.  Grade 2 strain (moderate): Muscles are partly torn. It may  take one to two months to heal.  Grade 3 strain (more severe): Muscles are ruptured (completely torn). This is rare with gluteal muscles. A severe strain can take more than three months to heal. A severe stain may require surgery, but this is rare. TREATMENT  Several treatment methods can be used. Be sure to discuss the options with your caregiver. They include:  Rest. Take a break from the activity that caused the injury.  Cold packs. Applying cold to your buttocks may ease swelling and pain.  Pain medication. Only take over-the-counter medicines for pain and discomfort as directed by your caregiver.  Physical therapy. You will work on specific exercises to get the gluteal muscles back in shape. These exercises usually involve stretching. A physical therapist can show you what to do and what not to do as you heal. HOME CARE INSTRUCTIONS   Take any pain medication suggested by your healthcare provider. Follow the directions carefully.  Use cold packs as directed by your healthcare provider or physical therapist.  If you have physical therapy, follow through with the therapist's suggestions. Be sure you understand the exercises you will be doing, including:  How often the exercises should be done.  How many times each exercise should be repeated.  How long they should be done.  Oother activities you should or should not do.  Learn as much as you can about training for your sport or exercise. This should help you avoid future muscle strains. [place]  IF:   Pain, stiffness, or weakness gets worse.  You have questions about any medications. Document Released: 02/17/2009 Document Revised: 07/15/2011 Document Reviewed: 02/17/2009 [facility] Patient Information 2014 [place], [place].   Piriformis Syndrome These exercises may help you when beginning to rehabilitate your injury. Your symptoms may resolve with or without further involvement from your physician, physical therapist  or athletic trainer. While completing these exercises, remember:   Restoring tissue flexibility helps normal motion to return to the joints. This allows healthier, less painful movement and activity.  An effective stretch should be held for at least 30 seconds.  A stretch should never be painful. You should only feel a gentle lengthening or release in the stretched tissue. STRETCH - Hip Rotators  Lie on your back on a firm surface. Grasp your right / left knee with your right / left hand and your ankle with your opposite hand.  Keeping your hips and shoulders firmly planted, gently pull your right / left knee and rotate your lower leg toward your opposite shoulder until you feel a stretch in your buttocks.  Hold this stretch for __________ seconds. Repeat this stretch __________ times. Complete this stretch __________ times per day. STRETCH  Iliotibial Band  On the floor or bed, lie on your side so your right / left leg is on top. Bend your knee and grab your ankle.  Slowly bring your knee back so that your thigh is in line with your trunk. Keep your heel at your buttocks and gently arch your back so your head, shoulders and hips line up.  Slowly lower your leg so that your knee approaches the floor/bed until you feel a gentle stretch on the outside of your right / left thigh. If you do not feel a stretch and your knee will not fall farther, place the heel of your opposite foot on top of your knee and pull your thigh down farther.  Hold this stretch for __________ seconds. Repeat __________ times. Complete __________ times per day. STRENGTHENING EXERCISES - Piriformis Syndrome  These are some of the caregiver again or until your symptoms are resolved. Remember:   Strong muscles with good endurance tolerate stress better.  Do the exercises as initially prescribed by your caregiver. Progress slowly with each exercise, gradually increasing the number of repetitions and weight used under  their guidance. STRENGTH - Hip Abductors, Straight Leg Raises Be aware of your form throughout the entire exercise so that you exercise the correct muscles. Sloppy form means that you are not strengthening the correct muscles.  Lie on your side so that your head, shoulders, knee and hip line up. You may bend your lower knee to help maintain your balance. Your right / left leg should be on top.  Roll your hips slightly forward, so that your hips are stacked directly over each other and your right / left knee is facing forward.  Lift your top leg up 4-6 inches, leading with your heel. Be sure that your foot does not drift forward or that your knee does not roll toward the ceiling.  Hold this position for __________ seconds. You should feel the muscles in your outer hip lifting (you may not notice this until your leg begins to tire).  Slowly lower your leg to the starting position. Allow the muscles to fully relax before beginning the next repetition. Repeat __________ times. Complete this exercise __________ times per day.  STRENGTH - Hip Abductors, Quadriped  On a firm, lightly padded surface, position yourself  on your hands and knees. Your hands should be directly below your shoulders and your knees should be directly below your hips.  Keeping your right / left knee bent, lift your leg out to the side. Keep your legs level and in line with your shoulders.  Position yourself on your hands and knees.  Hold for __________ seconds.  Keeping your trunk steady and your hips level, slowly lower your leg to the starting position. Repeat __________ times. Complete this exercise __________ times per day.  STRENGTH - Hip Abductors, Standing  Tie one end of a rubber exercise band/tubing to a secure surface (table, pole) and tie a loop at the other end.  Place the loop around your right / left ankle. Keeping your ankle with the band directly opposite of the secured end, step away until there is  tension in the tube/band.  Hold onto a chair as needed for balance.  Keeping your back upright, your shoulders over your hips, and your toes pointing forward, lift your right / left leg out to your side. Be sure to lift your leg with your hip muscles. Do not "throw" your leg or tip your body to lift your leg.  Slowly and with control, return to the starting position. Repeat exercise __________ times. Complete this exercise __________ times per day.  Document Released: 04/22/2005 Document Revised: 10/22/2011 Document Reviewed: 08/04/2008 [facility] Patient Information 2014 [place], [place].

## 2013-06-15 ENCOUNTER — Other Ambulatory Visit: Payer: Self-pay | Admitting: *Deleted

## 2013-06-15 DIAGNOSIS — J449 Chronic obstructive pulmonary disease, unspecified: Secondary | ICD-10-CM

## 2013-06-16 ENCOUNTER — Telehealth: Payer: Self-pay | Admitting: *Deleted

## 2013-06-16 MED ORDER — GUAIFENESIN-CODEINE 100-10 MG/5ML PO SYRP: 5.0000 mL | ORAL_SOLUTION | Freq: Three times a day (TID) | ORAL | Status: DC | PRN

## 2013-06-16 NOTE — Telephone Encounter (Signed)
Spoke with patient and informed her that I called in the rx for robitussin to [place] for her

## 2013-07-05 ENCOUNTER — Telehealth: Payer: Self-pay | Admitting: Family Medicine

## 2013-07-05 NOTE — Telephone Encounter (Signed)
Pt called and would like a refill on her hydrocodone left up front for pickup. jw °

## 2013-07-12 ENCOUNTER — Ambulatory Visit (INDEPENDENT_AMBULATORY_CARE_PROVIDER_SITE_OTHER): Payer: Medicare Other | Admitting: Family Medicine

## 2013-07-12 ENCOUNTER — Encounter: Payer: Self-pay | Admitting: Family Medicine

## 2013-07-12 VITALS — BP 129/62 | HR 98 | Temp 97.6°F | Ht 64.0 in | Wt 122.7 lb

## 2013-07-12 DIAGNOSIS — E785 Hyperlipidemia, unspecified: Secondary | ICD-10-CM

## 2013-07-12 DIAGNOSIS — M545 Low back pain, unspecified: Secondary | ICD-10-CM | POA: Diagnosis not present

## 2013-07-12 DIAGNOSIS — Z79899 Other long term (current) drug therapy: Secondary | ICD-10-CM

## 2013-07-12 DIAGNOSIS — E876 Hypokalemia: Secondary | ICD-10-CM | POA: Diagnosis not present

## 2013-07-12 LAB — BASIC METABOLIC PANEL
BUN: 12 mg/dL (ref 6–23)
CO2: 24 meq/L (ref 19–32)
Calcium: 9.4 mg/dL (ref 8.4–10.5)
Chloride: 101 mEq/L (ref 96–112)
Creat: 1.16 mg/dL — ABNORMAL HIGH (ref 0.50–1.10)
Glucose, Bld: 89 mg/dL (ref 70–99)
POTASSIUM: 4.3 meq/L (ref 3.5–5.3)
Sodium: 137 mEq/L (ref 135–145)

## 2013-07-12 MED ORDER — CYCLOBENZAPRINE HCL 5 MG PO TABS: 5.0000 mg | ORAL_TABLET | Freq: Three times a day (TID) | ORAL | Status: DC | PRN

## 2013-07-12 MED ORDER — HYDROCODONE-ACETAMINOPHEN 10-325 MG PO TABS: 1.0000 | ORAL_TABLET | Freq: Four times a day (QID) | ORAL | Status: DC | PRN

## 2013-07-12 NOTE — Patient Instructions (Signed)
Daleen, we will get an MRI of your back and send you to the back specialists for further evaluation. We will see you back in about 3 months.  If you need anything further, please call.  Thanks, Dr. Awanda Mink

## 2013-07-12 NOTE — Telephone Encounter (Signed)
Will Rx at appointment today for pt.  Tamela Oddi Awanda Mink, DO of [facility] 07/12/2013, 9:41 AM

## 2013-07-12 NOTE — Addendum Note (Signed)
Addended by: [place], Eldean Nanna on: 07/12/2013 11:06 AM   Modules accepted: Orders

## 2013-07-12 NOTE — Assessment & Plan Note (Signed)
At this point, conservative management has been performed for the past 6 months with minimal improvement with PT/home exercise programs and medications.  Will refer back to orthopaedics for MRI and possible injections/surgical management.  F/U in three months.

## 2013-07-12 NOTE — Assessment & Plan Note (Signed)
Well controlled on Lipitor 20 mg, no SE, continue.

## 2013-07-12 NOTE — Progress Notes (Signed)
Paula Duncan is a 53 y.o. female who presents today for here re-evaluation of R gluteus spasm and LBP.  R gluteus spasm - Improved s/p injection at last visit.  No pain today.  LBP - Unchanged, has done PT and now performing daily core strengthening with minimal change. Denies any bowel/bladder incontinence, fevers, chills, weight loss, midline tenderness of her spine   Past Medical History  Diagnosis Date  . COPD (chronic obstructive pulmonary disease)     a. nl PFT's in 2014  . PVC (premature ventricular contraction)     a. 2012 Holter: sinus tach and pvc's assoc with dizziness.  . 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 bilateral LE paralysis, resolved;  b. neg neuro w/u for sz in 2010  . Anorexia   . Hyperlipidemia   . Hx of migraines   . Chronic back pain   . Chest pain     a. 12/2009 MV ([facility]): Neg;  b. 03/2013 MV: small, mild reversible defect in the distal PDA, EF 60%.  . H/O sleep apnea     a. 2010: sleep study neg for osa/cataplexy/narcolepsy  . Melena     a. nl EGD  . History of tobacco abuse     a. 30 pack year hx, quit 2013.    History  Smoking status  . Former Smoker -- 1.00 packs/day for 30 years  . Types: Cigarettes  . Quit date: 02/04/2011  Smokeless tobacco  . Never Used    Comment: Quit October 2012    Family History  Problem Relation Age of Onset  . Alcohol abuse Mother   . Cancer Father     Colon carcinoma, from mets from prostate cancer  . Cirrhosis Mother   . Colon polyps Sister     Current Outpatient Prescriptions on File Prior to Visit  Medication Sig Dispense Refill  . albuterol (PROVENTIL HFA;VENTOLIN HFA) 108 (90 BASE) MCG/ACT inhaler Inhale 2 puffs into the lungs every 6 (six) hours as needed for wheezing.  1 Inhaler  5  . aspirin EC 81 MG tablet Take 1 tablet (81 mg total) by mouth daily.  30 tablet  3  . atenolol (TENORMIN) 25 MG tablet Take 1 tablet (25 mg total) by mouth daily.  30 tablet  5  . atorvastatin  (LIPITOR) 20 MG tablet Take 1 tablet (20 mg total) by mouth daily.  90 tablet  3  . benzonatate (TESSALON) 200 MG capsule Take 1 capsule (200 mg total) by mouth 3 (three) times daily as needed for cough.  60 capsule  3  . clobetasol cream (TEMOVATE) 0.05 % Apply topically 2 (two) times daily.  30 g  4  . guaiFENesin-codeine (ROBITUSSIN AC) 100-10 MG/5ML syrup Take 5 mLs by mouth 3 (three) times daily as needed for cough.  240 mL  1  . HYDROcodone-acetaminophen (NORCO) 10-325 MG per tablet Take 1 tablet by mouth every 8 (eight) hours as needed for moderate pain.  180 tablet  0  . mirtazapine (REMERON) 15 MG tablet Take 1 tablet (15 mg total) by mouth at bedtime.  30 tablet  1  . orphenadrine (NORFLEX) 100 MG tablet Take 1 tablet (100 mg total) by mouth 3 (three) times daily as needed for muscle spasms.  90 tablet  11  . promethazine (PHENERGAN) 25 MG suppository Place 1 suppository (25 mg total) rectally every 8 (eight) hours as needed for nausea.  12 each  0  . promethazine (PHENERGAN) 25 MG  tablet Take 1 tablet (25 mg total) by mouth every 8 (eight) hours as needed for nausea.  20 tablet  3  . traZODone (DESYREL) 50 MG tablet Take 0.5-1 tablets (25-50 mg total) by mouth at bedtime as needed for sleep.  90 tablet  2  . zolpidem (AMBIEN) 5 MG tablet Take 1 tablet (5 mg total) by mouth at bedtime as needed for sleep.  90 tablet  0   No current facility-administered medications on file prior to visit.    ROS: Per HPI.  All other systems reviewed and are negative.   Physical Exam Filed Vitals:   07/12/13 0959  BP: 129/62  Pulse: 98  Temp: 97.6 F (36.4 C)   Gen: NAD, Well nourished, Well developed  Cardio: RRR, No murmurs/gallops/rubs  Lungs: CTA, no wheezes, rhonchi, crackles  Neuro:  MS 5/5 B/L UE and LE, +2 patellar and achilles relfex b/l  Back Exam:  No gross deformities, edema, + midline vertebral incision scar along lumbar vertebrae  ROM - 90 flexion, 25 extension, SB and Rotation  Nml B/L  1) TTP along Lumbar Vertebrae - no  3. Pain with :   1) Extension - yes   2) Flexion - yes  6. Sitting Leg Raise - yes @ 20 degrees  9. Vascular Exam : DP and PT +2 B/L

## 2013-07-13 ENCOUNTER — Encounter: Payer: Self-pay | Admitting: Family Medicine

## 2013-07-13 DIAGNOSIS — M545 Low back pain, unspecified: Secondary | ICD-10-CM | POA: Diagnosis not present

## 2013-07-14 ENCOUNTER — Telehealth: Payer: Self-pay | Admitting: Family Medicine

## 2013-07-14 ENCOUNTER — Other Ambulatory Visit: Payer: Self-pay | Admitting: *Deleted

## 2013-07-14 NOTE — Telephone Encounter (Signed)
Went to [employer]. Was told there was nothing they could do since she spinal fusion. Has been scheduled for MRI next week. Suggested seeing a neurosurgeon Just wanted der hess to know

## 2013-07-15 ENCOUNTER — Other Ambulatory Visit: Payer: Self-pay | Admitting: *Deleted

## 2013-07-16 ENCOUNTER — Other Ambulatory Visit: Payer: Self-pay | Admitting: *Deleted

## 2013-07-16 DIAGNOSIS — H40039 Anatomical narrow angle, unspecified eye: Secondary | ICD-10-CM | POA: Diagnosis not present

## 2013-07-16 DIAGNOSIS — H2510 Age-related nuclear cataract, unspecified eye: Secondary | ICD-10-CM | POA: Diagnosis not present

## 2013-07-17 MED ORDER — PROMETHAZINE HCL 25 MG PO TABS: 25.0000 mg | ORAL_TABLET | Freq: Three times a day (TID) | ORAL | Status: DC | PRN

## 2013-07-19 ENCOUNTER — Telehealth: Payer: Self-pay | Admitting: Family Medicine

## 2013-07-19 DIAGNOSIS — Z981 Arthrodesis status: Secondary | ICD-10-CM

## 2013-07-19 DIAGNOSIS — M545 Low back pain, unspecified: Secondary | ICD-10-CM

## 2013-07-19 NOTE — Telephone Encounter (Signed)
Discussed creatinine results with pt and also her recent visit with the orthopaedist for her back.  Referral placed for neurosurgeon for further evaluation and management.   Tamela Oddi Johnathan Tortorelli, DO of [facility] 07/19/2013, 11:02 AM

## 2013-07-19 NOTE — Telephone Encounter (Signed)
Patient would like her results from labs done on 07/12/13. Please call.

## 2013-07-20 ENCOUNTER — Ambulatory Visit
Admission: RE | Admit: 2013-07-20 | Discharge: 2013-07-20 | Disposition: A | Discharge status: Home / Self Care | Payer: Medicare Other | Source: Ambulatory Visit | Attending: Family Medicine | Admitting: Family Medicine

## 2013-07-20 DIAGNOSIS — M545 Low back pain, unspecified: Secondary | ICD-10-CM

## 2013-07-20 DIAGNOSIS — M5137 Other intervertebral disc degeneration, lumbosacral region: Secondary | ICD-10-CM | POA: Diagnosis not present

## 2013-07-21 ENCOUNTER — Telehealth: Payer: Self-pay | Admitting: Family Medicine

## 2013-07-21 NOTE — Telephone Encounter (Signed)
Would like results of MRi

## 2013-07-22 NOTE — Telephone Encounter (Signed)
Discussed results with pt of her MRI, no questions.  Awaiting neurosurgery scheduling.  Tamela Oddi Awanda Mink, DO of [facility] 07/22/2013, 4:36 PM

## 2013-07-27 ENCOUNTER — Other Ambulatory Visit: Payer: Self-pay | Admitting: *Deleted

## 2013-07-27 MED ORDER — CLOBETASOL PROPIONATE 0.05 % EX CREA: TOPICAL_CREAM | Freq: Two times a day (BID) | CUTANEOUS | Status: DC

## 2013-08-11 ENCOUNTER — Other Ambulatory Visit: Payer: Self-pay | Admitting: *Deleted

## 2013-08-11 DIAGNOSIS — G4700 Insomnia, unspecified: Secondary | ICD-10-CM

## 2013-08-11 MED ORDER — ZOLPIDEM TARTRATE 5 MG PO TABS: 5.0000 mg | ORAL_TABLET | Freq: Every evening | ORAL | Status: DC | PRN

## 2013-09-03 ENCOUNTER — Other Ambulatory Visit: Payer: Self-pay | Admitting: *Deleted

## 2013-09-03 MED ORDER — MIRTAZAPINE 15 MG PO TABS: 15.0000 mg | ORAL_TABLET | Freq: Every day | ORAL | Status: DC

## 2013-09-16 ENCOUNTER — Other Ambulatory Visit: Payer: Self-pay | Admitting: *Deleted

## 2013-09-16 MED ORDER — TRAZODONE HCL 50 MG PO TABS: 25.0000 mg | ORAL_TABLET | Freq: Every evening | ORAL | Status: DC | PRN

## 2013-10-08 ENCOUNTER — Other Ambulatory Visit: Payer: Self-pay | Admitting: *Deleted

## 2013-10-08 MED ORDER — ATENOLOL 25 MG PO TABS: 25.0000 mg | ORAL_TABLET | Freq: Every day | ORAL | Status: DC

## 2013-10-08 NOTE — Telephone Encounter (Signed)
Dr Caryl Bis covering for Dr Awanda Mink. Refills given for atenolol. Please inform the patient.

## 2013-10-08 NOTE — Telephone Encounter (Signed)
informed

## 2013-10-26 ENCOUNTER — Telehealth: Payer: Self-pay | Admitting: Family Medicine

## 2013-10-26 DIAGNOSIS — M5440 Lumbago with sciatica, unspecified side: Secondary | ICD-10-CM

## 2013-10-26 MED ORDER — HYDROCODONE-ACETAMINOPHEN 10-325 MG PO TABS: 1.0000 | ORAL_TABLET | Freq: Four times a day (QID) | ORAL | Status: DC | PRN

## 2013-10-26 NOTE — Telephone Encounter (Addendum)
Pt called and needs a refill on her hydrocodone. jw  Rx ready for pick up.  Thanks, Tamela Oddi. Awanda Mink, DO of [facility] 10/26/2013, 2:51 PM

## 2013-10-26 NOTE — Telephone Encounter (Signed)
Spoke with patient and informed her of below 

## 2013-11-08 ENCOUNTER — Encounter: Payer: Self-pay | Admitting: Family Medicine

## 2013-11-08 ENCOUNTER — Ambulatory Visit (INDEPENDENT_AMBULATORY_CARE_PROVIDER_SITE_OTHER): Payer: Medicare Other | Admitting: Family Medicine

## 2013-11-08 VITALS — BP 95/61 | HR 97 | Temp 98.0°F | Ht 64.0 in | Wt 124.0 lb

## 2013-11-08 DIAGNOSIS — M5430 Sciatica, unspecified side: Secondary | ICD-10-CM | POA: Diagnosis not present

## 2013-11-08 DIAGNOSIS — M5442 Lumbago with sciatica, left side: Principal | ICD-10-CM

## 2013-11-08 DIAGNOSIS — IMO0002 Reserved for concepts with insufficient information to code with codable children: Secondary | ICD-10-CM

## 2013-11-08 DIAGNOSIS — M5440 Lumbago with sciatica, unspecified side: Secondary | ICD-10-CM

## 2013-11-08 DIAGNOSIS — G4700 Insomnia, unspecified: Secondary | ICD-10-CM

## 2013-11-08 DIAGNOSIS — M5441 Lumbago with sciatica, right side: Secondary | ICD-10-CM

## 2013-11-08 MED ORDER — HYDROCODONE-ACETAMINOPHEN 10-325 MG PO TABS: 1.0000 | ORAL_TABLET | Freq: Four times a day (QID) | ORAL | Status: DC | PRN

## 2013-11-08 MED ORDER — ZOLPIDEM TARTRATE 5 MG PO TABS: 5.0000 mg | ORAL_TABLET | Freq: Every evening | ORAL | Status: DC | PRN

## 2013-11-08 NOTE — Patient Instructions (Signed)
Paula Duncan, it was a pleasure seeing you as always.  We will be in touch about your neurosurgery appointment.  Otherwise we will see you back in three months.  If your finger does not improve in about 1-2 weeks we will se you back then.  Thanks, Dr. Awanda Mink

## 2013-11-08 NOTE — Progress Notes (Signed)
Paula Duncan is a 53 y.o. female who presents today for here LBP/insomnia/acute paronychia.  Insomnia - Stable on ambien, denies any sleep walking or dowsiness.   LBP - Unchanged, has done PT and now performing daily core strengthening with minimal change. Denies any bowel/bladder incontinence, fevers, chills, weight loss, midline tenderness of her spine. Went to orthopaedist who did not have much to offer.    Acute Paronychia - Has been ongoing now for the past week, some purulent drainage but no evidence of abscess formation.  Denies any fever, chills, sweats.  Does have pain at that site right now, but no streaking or spreading currently.    Past Medical History  Diagnosis Date  . COPD (chronic obstructive pulmonary disease)     a. nl PFT's in 2014  . PVC (premature ventricular contraction)     a. 2012 Holter: sinus tach and pvc's assoc with dizziness.  . 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 bilateral LE paralysis, resolved;  b. neg neuro w/u for sz in 2010  . Anorexia   . Hyperlipidemia   . Hx of migraines   . Chronic back pain   . Chest pain     a. 12/2009 MV ([facility]): Neg;  b. 03/2013 MV: small, mild reversible defect in the distal PDA, EF 60%.  . H/O sleep apnea     a. 2010: sleep study neg for osa/cataplexy/narcolepsy  . Melena     a. nl EGD  . History of tobacco abuse     a. 30 pack year hx, quit 2013.    History  Smoking status  . Former Smoker -- 1.00 packs/day for 30 years  . Types: Cigarettes  . Quit date: 02/04/2011  Smokeless tobacco  . Never Used    Comment: Quit October 2012    Family History  Problem Relation Age of Onset  . Alcohol abuse Mother   . Cancer Father     Colon carcinoma, from mets from prostate cancer  . Cirrhosis Mother   . Colon polyps Sister     Current Outpatient Prescriptions on File Prior to Visit  Medication Sig Dispense Refill  . albuterol (PROVENTIL HFA;VENTOLIN HFA) 108 (90 BASE) MCG/ACT inhaler Inhale 2  puffs into the lungs every 6 (six) hours as needed for wheezing.  1 Inhaler  5  . aspirin EC 81 MG tablet Take 1 tablet (81 mg total) by mouth daily.  30 tablet  3  . atenolol (TENORMIN) 25 MG tablet Take 1 tablet (25 mg total) by mouth daily.  30 tablet  5  . atorvastatin (LIPITOR) 20 MG tablet Take 1 tablet (20 mg total) by mouth daily.  90 tablet  3  . benzonatate (TESSALON) 200 MG capsule Take 1 capsule (200 mg total) by mouth 3 (three) times daily as needed for cough.  60 capsule  3  . clobetasol cream (TEMOVATE) 0.05 % Apply topically 2 (two) times daily.  120 g  5  . cyclobenzaprine (FLEXERIL) 5 MG tablet Take 1 tablet (5 mg total) by mouth 3 (three) times daily as needed for muscle spasms.  60 tablet  3  . guaiFENesin-codeine (ROBITUSSIN AC) 100-10 MG/5ML syrup Take 5 mLs by mouth 3 (three) times daily as needed for cough.  240 mL  1  . HYDROcodone-acetaminophen (LORCET HD) 10-325 MG per tablet Take 1 tablet by mouth every 6 (six) hours as needed.  180 tablet  0  . mirtazapine (REMERON) 15 MG tablet Take 1  tablet (15 mg total) by mouth at bedtime.  30 tablet  5  . promethazine (PHENERGAN) 25 MG suppository Place 1 suppository (25 mg total) rectally every 8 (eight) hours as needed for nausea.  12 each  0  . promethazine (PHENERGAN) 25 MG tablet Take 1 tablet (25 mg total) by mouth every 8 (eight) hours as needed for nausea.  60 tablet  3  . traZODone (DESYREL) 50 MG tablet Take 0.5-1 tablets (25-50 mg total) by mouth at bedtime as needed for sleep.  90 tablet  2  . zolpidem (AMBIEN) 5 MG tablet Take 1 tablet (5 mg total) by mouth at bedtime as needed for sleep.  90 tablet  0   No current facility-administered medications on file prior to visit.    ROS: Per HPI.  All other systems reviewed and are negative.   Physical Exam Filed Vitals:   11/08/13 1148  BP: 95/61  Pulse: 97  Temp: 98 F (36.7 C)   Gen: NAD, Well nourished, Well developed  Cardio: RRR, No murmurs/gallops/rubs   Lungs: CTA, no wheezes, rhonchi, crackles  Neuro:  MS 5/5 B/L UE and LE, +2 patellar and achilles relfex b/l  Back Exam:  No gross deformities, edema, + midline vertebral incision scar along lumbar vertebrae  ROM - 90 flexion, 25 extension, SB and Rotation Nml B/L  1) TTP along Lumbar Vertebrae - no  3. Pain with :   1) Extension - yes   2) Flexion - yes  6. Sitting Leg Raise - yes @ 20 degrees  9. Vascular Exam : DP and PT +2 B/L   Extremities - + TTP lateral nail fold of 2nd distal phalanx on the R, no abscess formation, no purulent drainage.

## 2013-11-08 NOTE — Assessment & Plan Note (Signed)
Stable, continue ambien.

## 2013-11-08 NOTE — Assessment & Plan Note (Signed)
At this point, conservative management has been performed for the past 6 months with minimal improvement with PT/home exercise programs and medications.  Orthopaedics did not have much to offer and recommended a neurosurgeon evaluation.  Consult to neurosurgery for MRI and possible injections/surgical management.  F/U in three months.

## 2013-11-08 NOTE — Assessment & Plan Note (Signed)
Continue with Neosporin with bandaid along with Epsom salt baths 2-3 x per day for 20 minutes.  F/U if no improvement in the next 1-2 weeks.

## 2013-11-26 ENCOUNTER — Other Ambulatory Visit: Payer: Self-pay | Admitting: *Deleted

## 2013-11-27 MED ORDER — TRAZODONE HCL 50 MG PO TABS: 25.0000 mg | ORAL_TABLET | Freq: Every evening | ORAL | Status: DC | PRN

## 2013-12-01 ENCOUNTER — Other Ambulatory Visit: Payer: Self-pay | Admitting: *Deleted

## 2013-12-01 MED ORDER — ORPHENADRINE CITRATE ER 100 MG PO TB12: 100.0000 mg | ORAL_TABLET | Freq: Three times a day (TID) | ORAL | Status: DC | PRN

## 2013-12-06 ENCOUNTER — Telehealth: Payer: Self-pay | Admitting: Family Medicine

## 2013-12-06 NOTE — Telephone Encounter (Signed)
[facility] called patient today and once she told him that she had a spine fusion, he told her he would have to call her back. She states she would like to speak to Dr. Awanda Mink to find out the next step since she is having so many issues being seeing by Neurosurgeon. Please advise.

## 2013-12-07 NOTE — Telephone Encounter (Signed)
Discussed with pt.  We will wait for neurosurgery to call back and if they will not see her, we will send her back to [facility].  Thanks [employer]. Awanda Mink, DO of [facility] 12/07/2013, 12:52 PM

## 2013-12-07 NOTE — Telephone Encounter (Signed)
Unable to reach patient to obtain more information.please advise.thank you.Paula Duncan, Paula Duncan

## 2014-01-04 ENCOUNTER — Encounter: Payer: Self-pay | Admitting: Family Medicine

## 2014-01-04 ENCOUNTER — Ambulatory Visit (INDEPENDENT_AMBULATORY_CARE_PROVIDER_SITE_OTHER): Payer: Medicare Other | Admitting: Family Medicine

## 2014-01-04 VITALS — BP 116/45 | HR 80 | Temp 98.9°F | Ht 64.0 in | Wt 116.0 lb

## 2014-01-04 DIAGNOSIS — M5430 Sciatica, unspecified side: Secondary | ICD-10-CM

## 2014-01-04 DIAGNOSIS — R229 Localized swelling, mass and lump, unspecified: Secondary | ICD-10-CM | POA: Diagnosis not present

## 2014-01-04 DIAGNOSIS — M5440 Lumbago with sciatica, unspecified side: Secondary | ICD-10-CM

## 2014-01-04 MED ORDER — HYDROCODONE-ACETAMINOPHEN 10-325 MG PO TABS: 1.0000 | ORAL_TABLET | Freq: Four times a day (QID) | ORAL | Status: DC | PRN

## 2014-01-04 MED ORDER — CARISOPRODOL 350 MG PO TABS: 350.0000 mg | ORAL_TABLET | Freq: Four times a day (QID) | ORAL | Status: DC | PRN

## 2014-01-04 NOTE — Progress Notes (Signed)
Paula Duncan is a 53 y.o. female who presents today for R forearm nodule.  Pt states this has been there for the last month about after she fell in her garden onto the R posterior arm.  She had a small area of ecchymosis that eventually changed into small nodule and has now grown slightly over that time to larger nodule.  This is tender and she denies any relief or trying anything for this.  She denies weight loss, fever, chills, LAD.  Denies any paresthesias into her distant arm.    Past Medical History  Diagnosis Date  . COPD (chronic obstructive pulmonary disease)     a. nl PFT's in 2014  . PVC (premature ventricular contraction)     a. 2012 Holter: sinus tach and pvc's assoc with dizziness.  . 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 bilateral LE paralysis, resolved;  b. neg neuro w/u for sz in 2010  . Anorexia   . Hyperlipidemia   . Hx of migraines   . Chronic back pain   . Chest pain     a. 12/2009 MV ([facility]): Neg;  b. 03/2013 MV: small, mild reversible defect in the distal PDA, EF 60%.  . H/O sleep apnea     a. 2010: sleep study neg for osa/cataplexy/narcolepsy  . Melena     a. nl EGD  . History of tobacco abuse     a. 30 pack year hx, quit 2013.    History  Smoking status  . Former Smoker -- 1.00 packs/day for 30 years  . Types: Cigarettes  . Quit date: 02/04/2011  Smokeless tobacco  . Never Used    Comment: Quit October 2012    Family History  Problem Relation Age of Onset  . Alcohol abuse Mother   . Cancer Father     Colon carcinoma, from mets from prostate cancer  . Cirrhosis Mother   . Colon polyps Sister     Current Outpatient Prescriptions on File Prior to Visit  Medication Sig Dispense Refill  . albuterol (PROVENTIL HFA;VENTOLIN HFA) 108 (90 BASE) MCG/ACT inhaler Inhale 2 puffs into the lungs every 6 (six) hours as needed for wheezing.  1 Inhaler  5  . aspirin EC 81 MG tablet Take 1 tablet (81 mg total) by mouth daily.  30 tablet  3  .  atenolol (TENORMIN) 25 MG tablet Take 1 tablet (25 mg total) by mouth daily.  30 tablet  5  . atorvastatin (LIPITOR) 20 MG tablet Take 1 tablet (20 mg total) by mouth daily.  90 tablet  3  . benzonatate (TESSALON) 200 MG capsule Take 1 capsule (200 mg total) by mouth 3 (three) times daily as needed for cough.  60 capsule  3  . clobetasol cream (TEMOVATE) 0.05 % Apply topically 2 (two) times daily.  120 g  5  . cyclobenzaprine (FLEXERIL) 5 MG tablet Take 1 tablet (5 mg total) by mouth 3 (three) times daily as needed for muscle spasms.  60 tablet  3  . guaiFENesin-codeine (ROBITUSSIN AC) 100-10 MG/5ML syrup Take 5 mLs by mouth 3 (three) times daily as needed for cough.  240 mL  1  . HYDROcodone-acetaminophen (LORCET HD) 10-325 MG per tablet Take 1 tablet by mouth every 6 (six) hours as needed.  180 tablet  0  . mirtazapine (REMERON) 15 MG tablet Take 1 tablet (15 mg total) by mouth at bedtime.  30 tablet  5  . orphenadrine (NORFLEX) 100  MG tablet Take 1 tablet (100 mg total) by mouth 3 (three) times daily as needed for muscle spasms.  90 tablet  11  . promethazine (PHENERGAN) 25 MG suppository Place 1 suppository (25 mg total) rectally every 8 (eight) hours as needed for nausea.  12 each  0  . promethazine (PHENERGAN) 25 MG tablet Take 1 tablet (25 mg total) by mouth every 8 (eight) hours as needed for nausea.  60 tablet  3  . traZODone (DESYREL) 50 MG tablet Take 0.5-1 tablets (25-50 mg total) by mouth at bedtime as needed for sleep.  90 tablet  2  . zolpidem (AMBIEN) 5 MG tablet Take 1 tablet (5 mg total) by mouth at bedtime as needed for sleep.  90 tablet  0   No current facility-administered medications on file prior to visit.    ROS: Per HPI.  All other systems reviewed and are negative.   Physical Exam Filed Vitals:   01/04/14 1039  BP: 116/45  Pulse: 80  Temp: 98.9 F (37.2 C)    Physical Examination: General appearance - alert, well appearing, and in no distress Extremities - R  arm - + 2 x 2 cm nodule on the posterior forearm distal to the olecranon, TTP, normal ROM, MS and NV intact Skin - normal coloration and turgor, no rashes, no suspicious skin lesions noted   Localized [place] - 2 x 2 CM Hypoechoic subcutaneous region that does not have hyperechoic spics in the area.  Located slightly distal to the olecranon and is not fluctuant with pressure.  The periosteum of the ulna is intact without edema or doppler enhancement in the area.

## 2014-01-04 NOTE — Assessment & Plan Note (Signed)
Located on the R posterior forearm around 2-3 cm from the olecranon.  TTP and on [place] there is hypoechoic tissue w/ small hypoechoic region of fluid present subcutaneously.  Most likely traumatic induced hematoma with resorption process as no evidence of Ca deposits in the tissue concerning for myositis ossificans.  Will most likely resolve over the next 1-2 months but if continues to enlarge, would highly consider sending to surgery for evaluation, excision, and biopsy to r/o other malignant processes.  Pt in agreement with plan.

## 2014-01-25 ENCOUNTER — Other Ambulatory Visit: Payer: Self-pay | Admitting: *Deleted

## 2014-01-25 DIAGNOSIS — G4700 Insomnia, unspecified: Secondary | ICD-10-CM

## 2014-01-25 MED ORDER — ZOLPIDEM TARTRATE 5 MG PO TABS: 5.0000 mg | ORAL_TABLET | Freq: Every evening | ORAL | Status: DC | PRN

## 2014-02-04 ENCOUNTER — Other Ambulatory Visit: Payer: Self-pay | Admitting: *Deleted

## 2014-02-04 DIAGNOSIS — G4700 Insomnia, unspecified: Secondary | ICD-10-CM

## 2014-02-07 MED ORDER — ZOLPIDEM TARTRATE 5 MG PO TABS: 5.0000 mg | ORAL_TABLET | Freq: Every evening | ORAL | Status: DC | PRN

## 2014-02-08 ENCOUNTER — Other Ambulatory Visit: Payer: Self-pay | Admitting: *Deleted

## 2014-02-08 DIAGNOSIS — G4700 Insomnia, unspecified: Secondary | ICD-10-CM

## 2014-02-09 MED ORDER — ZOLPIDEM TARTRATE 5 MG PO TABS: 5.0000 mg | ORAL_TABLET | Freq: Every evening | ORAL | Status: DC | PRN

## 2014-02-28 ENCOUNTER — Other Ambulatory Visit: Payer: Self-pay | Admitting: Family Medicine

## 2014-02-28 DIAGNOSIS — M5440 Lumbago with sciatica, unspecified side: Secondary | ICD-10-CM

## 2014-02-28 NOTE — Telephone Encounter (Signed)
Pt called and would like a refill on her Hydrocodone left up front for pickup. Please call when ready. jw

## 2014-03-02 MED ORDER — HYDROCODONE-ACETAMINOPHEN 10-325 MG PO TABS: 1.0000 | ORAL_TABLET | Freq: Four times a day (QID) | ORAL | Status: DC | PRN

## 2014-03-02 NOTE — Telephone Encounter (Signed)
Spoke with patient and informed he that rx up front for pick up

## 2014-03-14 ENCOUNTER — Other Ambulatory Visit: Payer: Self-pay | Admitting: *Deleted

## 2014-03-14 MED ORDER — MIRTAZAPINE 15 MG PO TABS: 15.0000 mg | ORAL_TABLET | Freq: Every day | ORAL | Status: DC

## 2014-04-08 ENCOUNTER — Other Ambulatory Visit: Payer: Self-pay

## 2014-04-08 MED ORDER — ATORVASTATIN CALCIUM 20 MG PO TABS: 20.0000 mg | ORAL_TABLET | Freq: Every day | ORAL | Status: DC

## 2014-04-14 ENCOUNTER — Encounter (HOSPITAL_COMMUNITY): Payer: Self-pay | Admitting: Cardiology

## 2014-04-15 ENCOUNTER — Other Ambulatory Visit: Payer: Self-pay | Admitting: *Deleted

## 2014-04-15 NOTE — Telephone Encounter (Signed)
Pt called and was checking the status of her request for Soma. jw

## 2014-04-18 ENCOUNTER — Telehealth: Payer: Self-pay | Admitting: Family Medicine

## 2014-04-18 MED ORDER — CARISOPRODOL 350 MG PO TABS: 350.0000 mg | ORAL_TABLET | Freq: Four times a day (QID) | ORAL | Status: DC | PRN

## 2014-04-18 NOTE — Telephone Encounter (Signed)
PLease let pt know ready for pick up.  Thanks [employer]. Rayann Jolley, DO of [facility] 04/18/2014, 1:20 PM

## 2014-04-18 NOTE — Telephone Encounter (Signed)
Attempted to call patient RX up front for pick up

## 2014-04-18 NOTE — Telephone Encounter (Signed)
Pt called again about her Paula Duncan being refilled

## 2014-04-18 NOTE — Telephone Encounter (Signed)
Wants Soma RX faxed to [facility]

## 2014-04-19 ENCOUNTER — Encounter: Payer: Self-pay | Admitting: Family Medicine

## 2014-04-19 ENCOUNTER — Ambulatory Visit (INDEPENDENT_AMBULATORY_CARE_PROVIDER_SITE_OTHER): Payer: Medicare Other | Admitting: Family Medicine

## 2014-04-19 ENCOUNTER — Other Ambulatory Visit: Payer: Self-pay | Admitting: Family Medicine

## 2014-04-19 VITALS — BP 103/70 | HR 102 | Temp 98.4°F | Ht 64.0 in | Wt 111.0 lb

## 2014-04-19 DIAGNOSIS — Z7189 Other specified counseling: Secondary | ICD-10-CM

## 2014-04-19 DIAGNOSIS — M5440 Lumbago with sciatica, unspecified side: Secondary | ICD-10-CM

## 2014-04-19 DIAGNOSIS — G8929 Other chronic pain: Secondary | ICD-10-CM

## 2014-04-19 DIAGNOSIS — M5441 Lumbago with sciatica, right side: Secondary | ICD-10-CM

## 2014-04-19 DIAGNOSIS — M5442 Lumbago with sciatica, left side: Secondary | ICD-10-CM | POA: Diagnosis not present

## 2014-04-19 DIAGNOSIS — G4700 Insomnia, unspecified: Secondary | ICD-10-CM | POA: Diagnosis not present

## 2014-04-19 MED ORDER — ATENOLOL 25 MG PO TABS: 25.0000 mg | ORAL_TABLET | Freq: Every day | ORAL | Status: DC

## 2014-04-19 MED ORDER — HYDROCODONE-ACETAMINOPHEN 10-325 MG PO TABS: 1.0000 | ORAL_TABLET | Freq: Four times a day (QID) | ORAL | Status: DC | PRN

## 2014-04-19 MED ORDER — ZOLPIDEM TARTRATE 5 MG PO TABS: 5.0000 mg | ORAL_TABLET | Freq: Every evening | ORAL | Status: DC | PRN

## 2014-04-19 NOTE — Assessment & Plan Note (Signed)
Continue conservative management.   Orthopaedics and neurosurgery in [place] unable to offer much due to previous hardware and procedures placed Only option left would be to send to [facility] for evaluation for some other procedure.

## 2014-04-19 NOTE — Assessment & Plan Note (Addendum)
Refill Norco today - Obtain UDS at next visit along with pain contract
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
## 2014-04-25 ENCOUNTER — Telehealth: Payer: Self-pay | Admitting: Family Medicine

## 2014-04-25 NOTE — Telephone Encounter (Signed)
Pt called and would like to know if Dr. Awanda Mink can call in something for her sinus infection. jw

## 2014-04-26 NOTE — Telephone Encounter (Signed)
Spoke with patient and gave below message. I offered her an appointment tomorrow and she stated she could not come in at them moment. Also offered Monday with Dr. Awanda Mink she will call us back and let us know what she can do. She states that she has been taking OTC medications with no reflief

## 2014-04-26 NOTE — Telephone Encounter (Signed)
Please let Ms. Hanover that she will need to be seen unfortunately.  I usually don't call in Rx for possible dx over phone due to ABx resistance.  Thanks [employer]. Awanda Mink, DO of [facility] 04/26/2014, 8:10 AM

## 2014-05-09 ENCOUNTER — Other Ambulatory Visit: Payer: Self-pay | Admitting: *Deleted

## 2014-05-09 DIAGNOSIS — R053 Chronic cough: Secondary | ICD-10-CM

## 2014-05-09 DIAGNOSIS — R05 Cough: Secondary | ICD-10-CM

## 2014-05-09 MED ORDER — BENZONATATE 200 MG PO CAPS
200.0000 mg | ORAL_CAPSULE | Freq: Three times a day (TID) | ORAL | Status: DC | PRN
Start: 2014-05-09 — End: 2014-07-07

## 2014-05-09 MED ORDER — PROMETHAZINE HCL 25 MG PO TABS: 25.0000 mg | ORAL_TABLET | Freq: Three times a day (TID) | ORAL | Status: DC | PRN

## 2014-05-10 ENCOUNTER — Encounter: Payer: Self-pay | Admitting: Family Medicine

## 2014-05-10 ENCOUNTER — Ambulatory Visit (INDEPENDENT_AMBULATORY_CARE_PROVIDER_SITE_OTHER): Payer: Medicare Other | Admitting: Family Medicine

## 2014-05-10 VITALS — BP 124/61 | HR 74 | Temp 98.1°F | Ht 64.0 in | Wt 113.9 lb

## 2014-05-10 DIAGNOSIS — J0100 Acute maxillary sinusitis, unspecified: Secondary | ICD-10-CM | POA: Diagnosis not present

## 2014-05-10 DIAGNOSIS — J019 Acute sinusitis, unspecified: Secondary | ICD-10-CM | POA: Insufficient documentation

## 2014-05-10 DIAGNOSIS — R197 Diarrhea, unspecified: Secondary | ICD-10-CM

## 2014-05-10 MED ORDER — GUAIFENESIN-CODEINE 100-10 MG/5ML PO SYRP: 5.0000 mL | ORAL_SOLUTION | Freq: Three times a day (TID) | ORAL | Status: DC | PRN

## 2014-05-10 MED ORDER — AMOXICILLIN-POT CLAVULANATE 875-125 MG PO TABS: 1.0000 | ORAL_TABLET | Freq: Two times a day (BID) | ORAL | Status: DC

## 2014-05-10 NOTE — Assessment & Plan Note (Addendum)
History and exam consistent with diagnosis. Treating with Augmentin 10 days.

## 2014-05-10 NOTE — Progress Notes (Signed)
   Subjective:    Patient ID: Paula Duncan, female    DOB: 22-May-1960, 54 y.o.   MRN: [id number]  HPI 54 year old female with past history of tobacco abuse, COPD, chronic cough, hypertension, hyperlipidemia who presents for same day appointment with complaints of sinus pressure/pain.  1) Sinus pressure/pain  Patient has had severe sinus pressure/pain with associated purulent nasal discharge for approximately 2 weeks.  She reports associated cough but states that this is chronic.  She has been febrile recently.  She states that 4 days ago she had a temperature of 103.  No exacerbating or relieving factors.  He has been taking cough syrup for her cough.  2) Diarrhea   Patient reports that since she has been ill she's had intermittent diarrhea. She reports that her stomach is "uneasy".  No associated nausea or vomiting.  Associated mild abdominal discomfort.  She continues to have decreased PO intake.    Review of Systems Per HPI    Objective:   Physical Exam Filed Vitals:   05/10/14 1042  BP: 124/61  Pulse: 74  Temp: 98.1 F (36.7 C)   Exam: General: Chronically ill-appearing female; appears older than stated age; NAD.  HEENT: NCAT.  Oropharynx clear.  Frontal and particularly maxillary sinuses very tender to palpation. Cardiovascular: RRR. No murmurs, rubs, or gallops. Respiratory: Inspiratory wheeze noted. No increased work of breathing Abdomen: soft, mildly tender to palpation in the periumbilical region. Nondistended. No guarding or rebound.    Assessment & Plan:  See Problem List

## 2014-05-10 NOTE — Patient Instructions (Signed)
I have prescribed an antibiotic for you sinus infection.  Increase her fiber intake and/or use a probiotic to help with the diarrhea. He can also use Imodium as needed.  Follow-up with your PCP as indicated.  Sinusitis Sinusitis is redness, soreness, and inflammation of the paranasal sinuses. Paranasal sinuses are air pockets within the bones of your face (beneath the eyes, the middle of the forehead, or above the eyes). In healthy paranasal sinuses, mucus is able to drain out, and air is able to circulate through them by way of your nose. However, when your paranasal sinuses are inflamed, mucus and air can become trapped. This can allow bacteria and other germs to grow and cause infection. Sinusitis can develop quickly and last only a short time (acute) or continue over a long period (chronic). Sinusitis that lasts for more than 12 weeks is considered chronic.  CAUSES  Causes of sinusitis include:  Allergies.  Structural abnormalities, such as displacement of the cartilage that separates your nostrils (deviated septum), which can decrease the air flow through your nose and sinuses and affect sinus drainage.  Functional abnormalities, such as when the small hairs (cilia) that line your sinuses and help remove mucus do not work properly or are not present. SIGNS AND SYMPTOMS  Symptoms of acute and chronic sinusitis are the same. The primary symptoms are pain and pressure around the affected sinuses. Other symptoms include:  Upper toothache.  Earache.  Headache.  Bad breath.  Decreased sense of smell and taste.  A cough, which worsens when you are lying flat.  Fatigue.  Fever.  Thick drainage from your nose, which often is green and may contain pus (purulent).  Swelling and warmth over the affected sinuses. DIAGNOSIS  Your health care provider will perform a physical exam. During the exam, your health care provider may:  Look in your nose for signs of abnormal growths in your  nostrils (nasal polyps).  Tap over the affected sinus to check for signs of infection.  View the inside of your sinuses (endoscopy) using an imaging device that has a light attached (endoscope). If your health care provider suspects that you have chronic sinusitis, one or more of the following tests may be recommended:  Allergy tests.  Nasal culture. A sample of mucus is taken from your nose, sent to a lab, and screened for bacteria.  Nasal cytology. A sample of mucus is taken from your nose and examined by your health care provider to determine if your sinusitis is related to an allergy. TREATMENT  Most cases of acute sinusitis are related to a viral infection and will resolve on their own within 10 days. Sometimes medicines are prescribed to help relieve symptoms (pain medicine, decongestants, nasal steroid sprays, or saline sprays).  However, for sinusitis related to a bacterial infection, your health care provider will prescribe antibiotic medicines. These are medicines that will help kill the bacteria causing the infection.  Rarely, sinusitis is caused by a fungal infection. In theses cases, your health care provider will prescribe antifungal medicine. For some cases of chronic sinusitis, surgery is needed. Generally, these are cases in which sinusitis recurs more than 3 times per year, despite other treatments. HOME CARE INSTRUCTIONS   Drink plenty of water. Water helps thin the mucus so your sinuses can drain more easily.  Use a humidifier.  Inhale steam 3 to 4 times a day (for example, sit in the bathroom with the shower running).  Apply a warm, moist washcloth to your face  3 to 4 times a day, or as directed by your health care provider.  Use saline nasal sprays to help moisten and clean your sinuses.  Take medicines only as directed by your health care provider.  If you were prescribed either an antibiotic or antifungal medicine, finish it all even if you start to feel  better. SEEK IMMEDIATE MEDICAL CARE IF:  You have increasing pain or severe headaches.  You have nausea, vomiting, or drowsiness.  You have swelling around your face.  You have vision problems.  You have a stiff neck.  You have difficulty breathing. MAKE SURE YOU:   Understand these instructions.  Will watch your condition.  Will get help right away if you are not doing well or get worse. Document Released: 04/22/2005 Document Revised: 09/06/2013 Document Reviewed: 05/07/2011 [facility] Patient Information 2015 [place], [place]. This information is not intended to replace advice given to you by your health care provider. Make sure you discuss any questions you have with your health care provider.

## 2014-05-10 NOTE — Assessment & Plan Note (Signed)
In the setting of recent illness and decreased PO intake. Exam benign today. Advised probiotic or fiber supplement.  Also advised PRN Imodium.

## 2014-05-31 ENCOUNTER — Telehealth: Payer: Self-pay | Admitting: Family Medicine

## 2014-05-31 NOTE — Telephone Encounter (Signed)
Hasnt slept in 3 days.  Ambien and trazadone are not workng any more Please advise

## 2014-06-01 NOTE — Telephone Encounter (Signed)
Probably needs SDA today or tomorrow for further evaluation.    Thanks [employer]. Awanda Mink, DO of [facility] 06/01/2014, 12:04 PM

## 2014-06-08 ENCOUNTER — Ambulatory Visit: Payer: Medicare Other | Admitting: Family Medicine

## 2014-06-09 ENCOUNTER — Ambulatory Visit (INDEPENDENT_AMBULATORY_CARE_PROVIDER_SITE_OTHER): Payer: Medicare Other | Admitting: Family Medicine

## 2014-06-09 ENCOUNTER — Encounter: Payer: Self-pay | Admitting: Family Medicine

## 2014-06-09 VITALS — BP 109/61 | HR 102 | Temp 98.1°F | Ht 64.0 in | Wt 110.0 lb

## 2014-06-09 DIAGNOSIS — G4700 Insomnia, unspecified: Secondary | ICD-10-CM

## 2014-06-09 DIAGNOSIS — Z7189 Other specified counseling: Secondary | ICD-10-CM | POA: Diagnosis not present

## 2014-06-09 DIAGNOSIS — M5440 Lumbago with sciatica, unspecified side: Secondary | ICD-10-CM

## 2014-06-09 DIAGNOSIS — M5442 Lumbago with sciatica, left side: Secondary | ICD-10-CM | POA: Diagnosis not present

## 2014-06-09 DIAGNOSIS — M5441 Lumbago with sciatica, right side: Secondary | ICD-10-CM | POA: Diagnosis not present

## 2014-06-09 DIAGNOSIS — G8929 Other chronic pain: Secondary | ICD-10-CM

## 2014-06-09 MED ORDER — HYDROCODONE-ACETAMINOPHEN 10-325 MG PO TABS: 1.0000 | ORAL_TABLET | Freq: Four times a day (QID) | ORAL | Status: DC | PRN

## 2014-06-09 MED ORDER — TRAZODONE HCL 100 MG PO TABS: 100.0000 mg | ORAL_TABLET | Freq: Every day | ORAL | Status: DC

## 2014-06-09 MED ORDER — ATORVASTATIN CALCIUM 20 MG PO TABS: 20.0000 mg | ORAL_TABLET | Freq: Every day | ORAL | Status: DC

## 2014-06-09 NOTE — Patient Instructions (Signed)
Insomnia Insomnia is frequent trouble falling and/or staying asleep. Insomnia can be a long term problem or a short term problem. Both are common. Insomnia can be a short term problem when the wakefulness is related to a certain stress or worry. Long term insomnia is often related to ongoing stress during waking hours and/or poor sleeping habits. Overtime, sleep deprivation itself can make the problem worse. Every little thing feels more severe because you are overtired and your ability to cope is decreased. CAUSES   Stress, anxiety, and depression.  Poor sleeping habits.  Distractions such as TV in the bedroom.  Naps close to bedtime.  Engaging in emotionally charged conversations before bed.  Technical reading before sleep.  Alcohol and other sedatives. They may make the problem worse. They can hurt normal sleep patterns and normal dream activity.  Stimulants such as caffeine for several hours prior to bedtime.  Pain syndromes and shortness of breath can cause insomnia.  Exercise late at night.  Changing time zones may cause sleeping problems (jet lag). It is sometimes helpful to have someone observe your sleeping patterns. They should look for periods of not breathing during the night (sleep apnea). They should also look to see how long those periods last. If you live alone or observers are uncertain, you can also be observed at a sleep clinic where your sleep patterns will be professionally monitored. Sleep apnea requires a checkup and treatment. Give your caregivers your medical history. Give your caregivers observations your family has made about your sleep.  SYMPTOMS   Not feeling rested in the morning.  Anxiety and restlessness at bedtime.  Difficulty falling and staying asleep. TREATMENT   Your caregiver may prescribe treatment for an underlying medical disorders. Your caregiver can give advice or help if you are using alcohol or other drugs for self-medication. Treatment  of underlying problems will usually eliminate insomnia problems.  Medications can be prescribed for short time use. They are generally not recommended for lengthy use.  Over-the-counter sleep medicines are not recommended for lengthy use. They can be habit forming.  You can promote easier sleeping by making lifestyle changes such as:  Using relaxation techniques that help with breathing and reduce muscle tension.  Exercising earlier in the day.  Changing your diet and the time of your last meal. No night time snacks.  Establish a regular time to go to bed.  Counseling can help with stressful problems and worry.  Soothing music and white noise may be helpful if there are background noises you cannot remove.  Stop tedious detailed work at least one hour before bedtime. HOME CARE INSTRUCTIONS   Keep a diary. Inform your caregiver about your progress. This includes any medication side effects. See your caregiver regularly. Take note of:  Times when you are asleep.  Times when you are awake during the night.  The quality of your sleep.  How you feel the next day. This information will help your caregiver care for you.  Get out of bed if you are still awake after 15 minutes. Read or do some quiet activity. Keep the lights down. Wait until you feel sleepy and go back to bed.  Keep regular sleeping and waking hours. Avoid naps.  Exercise regularly.  Avoid distractions at bedtime. Distractions include watching television or engaging in any intense or detailed activity like attempting to balance the household checkbook.  Develop a bedtime ritual. Keep a familiar routine of bathing, brushing your teeth, climbing into bed at the same   time each night, listening to soothing music. Routines increase the success of falling to sleep faster.  Use relaxation techniques. This can be using breathing and muscle tension release routines. It can also include visualizing peaceful scenes. You can  also help control troubling or intruding thoughts by keeping your mind occupied with boring or repetitive thoughts like the old concept of counting sheep. You can make it more creative like imagining planting one beautiful flower after another in your backyard garden.  During your day, work to eliminate stress. When this is not possible use some of the previous suggestions to help reduce the anxiety that accompanies stressful situations. MAKE SURE YOU:   Understand these instructions.  Will watch your condition.  Will get help right away if you are not doing well or get worse. Document Released: 04/19/2000 Document Revised: 07/15/2011 Document Reviewed: 05/20/2007 [employer] Patient Information 2015 [employer]. This information is not intended to replace advice given to you by your health care provider. Make sure you discuss any questions you have with your health care provider.  

## 2014-06-09 NOTE — Addendum Note (Signed)
Addended by: Johny Shears on: 06/09/2014 09:41 AM   Modules accepted: Orders

## 2014-06-09 NOTE — Assessment & Plan Note (Signed)
Increase Trazodone to 100 mg qhs along with Ambien and sleep hygiene - If no improvement in about 2 months, would consider hypnosis or acupuncture
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
## 2014-06-09 NOTE — Assessment & Plan Note (Signed)
Pain contract signed today and UDS performed
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
## 2014-06-10 ENCOUNTER — Telehealth: Payer: Self-pay | Admitting: Family Medicine

## 2014-06-10 LAB — DRUG SCREEN, URINE
AMPHETAMINE SCRN UR: NEGATIVE
BARBITURATE QUANT UR: NEGATIVE
BENZODIAZEPINES.: NEGATIVE
CREATININE, U: 164.71 mg/dL
Cocaine Metabolites: NEGATIVE
Marijuana Metabolite: NEGATIVE
Methadone: NEGATIVE
OPIATES: POSITIVE — AB
PHENCYCLIDINE (PCP): NEGATIVE
PROPOXYPHENE: NEGATIVE

## 2014-06-10 NOTE — Telephone Encounter (Signed)
Mrs. Coombs's doctor from [facility], who performed her spinal fusion surgery, need a copy of the MRI results that she had taken some time ago.  Can fax info to him, Dr. Marykay Lex, at [phone]

## 2014-06-10 NOTE — Telephone Encounter (Signed)
Faxed out

## 2014-06-22 DIAGNOSIS — M545 Low back pain: Secondary | ICD-10-CM | POA: Diagnosis not present

## 2014-06-22 DIAGNOSIS — M5137 Other intervertebral disc degeneration, lumbosacral region: Secondary | ICD-10-CM | POA: Diagnosis not present

## 2014-06-30 ENCOUNTER — Telehealth: Payer: Self-pay | Admitting: *Deleted

## 2014-06-30 NOTE — Telephone Encounter (Signed)
Pt called back to check on the status of Chantix. The reason she is requesting this is that Dr. Tyler Deis who will be doing her back surgery wants her to quit smoking before he does the surgery. jw

## 2014-06-30 NOTE — Telephone Encounter (Signed)
Received refill request from Pharmacy or Chantix.  Med not on pts list.  Will forward to MD. Clinton Sawyer, Salome Spotted

## 2014-06-30 NOTE — Telephone Encounter (Signed)
Does not take med.  Thanks [employer]. Marleen Moret, DO of [facility] 06/30/2014, 10:00 AM

## 2014-07-01 NOTE — Telephone Encounter (Signed)
I've never Rx this for her due to the fact that she's told me she does not smoke anymore.  If she needs to be on this, she should be seen to make sure we can discuss all options of smoking cessation.  Thanks [employer]. Awanda Mink, DO of [facility] 07/01/2014, 1:44 PM

## 2014-07-01 NOTE — Telephone Encounter (Signed)
Pt states she started smoking again when her husband had a heart attack last year.  She will check her husbands schedule and call back to make appt. Fleeger, [facility] Spotted

## 2014-07-07 ENCOUNTER — Encounter: Payer: Self-pay | Admitting: Family Medicine

## 2014-07-07 ENCOUNTER — Ambulatory Visit (INDEPENDENT_AMBULATORY_CARE_PROVIDER_SITE_OTHER): Payer: Medicare Other | Admitting: Family Medicine

## 2014-07-07 VITALS — BP 109/43 | HR 76 | Ht 64.0 in | Wt 114.0 lb

## 2014-07-07 DIAGNOSIS — R1013 Epigastric pain: Secondary | ICD-10-CM | POA: Diagnosis not present

## 2014-07-07 DIAGNOSIS — Z87891 Personal history of nicotine dependence: Secondary | ICD-10-CM | POA: Diagnosis not present

## 2014-07-07 DIAGNOSIS — M5442 Lumbago with sciatica, left side: Secondary | ICD-10-CM | POA: Diagnosis not present

## 2014-07-07 DIAGNOSIS — M5441 Lumbago with sciatica, right side: Secondary | ICD-10-CM

## 2014-07-07 LAB — BASIC METABOLIC PANEL
BUN: 7 mg/dL (ref 6–23)
CHLORIDE: 108 meq/L (ref 96–112)
CO2: 29 meq/L (ref 19–32)
CREATININE: 0.88 mg/dL (ref 0.50–1.10)
Calcium: 9.2 mg/dL (ref 8.4–10.5)
GLUCOSE: 83 mg/dL (ref 70–99)
Potassium: 4.3 mEq/L (ref 3.5–5.3)
Sodium: 141 mEq/L (ref 135–145)

## 2014-07-07 LAB — POCT H PYLORI SCREEN: H Pylori Screen, POC: NEGATIVE

## 2014-07-07 MED ORDER — DEXLANSOPRAZOLE 30 MG PO CPDR: 30.0000 mg | DELAYED_RELEASE_CAPSULE | Freq: Every day | ORAL | Status: DC

## 2014-07-07 MED ORDER — VARENICLINE TARTRATE 0.5 MG PO TABS: ORAL_TABLET | ORAL | Status: DC
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
## 2014-07-07 NOTE — Assessment & Plan Note (Signed)
Smoking 1/2 ppd currently for past year due to significant other disease state - Start Chantix, previously been on, usually causing nausea/vomiting in her and slight weight loss - F/U in 4 weeks

## 2014-07-07 NOTE — Assessment & Plan Note (Signed)
Pt with epigastric abdominal pain after meals.  Concern for possible PUD (gastric in origin).  Is > 55 and smoker, would ideally like to get EGD by GI w/ biopsy - Obtain H Pylori IgG ab screen today - Start Protonix 40 mg qd - F/U in 4 weeks

## 2014-07-07 NOTE — Patient Instructions (Signed)
Dexlansoprazole capsules  What is this medicine?  DEXLANSOPRAZOLE (dex lan SOE pra zole) prevents the production of acid in the stomach. It is used to treat gastroesophageal reflux disease (GERD) and inflammation of the esophagus.  This medicine may be used for other purposes; ask your health care provider or pharmacist if you have questions.  COMMON BRAND NAME(S): Dexilant, Kapidex  What should I tell my health care provider before I take this medicine?  They need to know if you have any of these conditions:  -liver disease  -low levels of magnesium in the blood  -an unusual or allergic reaction to dexlansoprazole, other medicines, foods, dyes, or preservatives  -pregnant or trying to get pregnant  -breast-feeding  How should I use this medicine?  Take this medicine by mouth. Swallow the capsules whole with a drink of water. Follow the directions on the prescription label. Do not crush or chew. Take your medicine at regular intervals. Do not take more often than directed.  If you have difficulty swallowing the capsules, you may open the capsule and sprinkle the contents on a tablespoon of applesauce. Do not crush the contents of the capsule into the food. Swallow the dose immediately after preparing it. Do not chew. Follow with a drink of water.  Talk to your pediatrician regarding the use of this medicine in children. Special care may be needed.  Overdosage: If you think you've taken too much of this medicine contact a poison control center or emergency room at once.  Overdosage: If you think you have taken too much of this medicine contact a poison control center or emergency room at once.  NOTE: This medicine is only for you. Do not share this medicine with others.  What if I miss a dose?  If you miss a dose, take it as soon as you can. If it is almost time for your next dose, take only that dose. Do not take double or extra doses.  What may interact with this medicine?  Do not take this medicine with any of the  following medications:  -atazanavir  -nelfinavir  This medicine may also interact with the following medications:  -ampicillin  -digoxin  -diuretics  -iron salts  -itraconazole  -ketoconazole  -warfarin  This list may not describe all possible interactions. Give your health care provider a list of all the medicines, herbs, non-prescription drugs, or dietary supplements you use. Also tell them if you smoke, drink alcohol, or use illegal drugs. Some items may interact with your medicine.  What should I watch for while using this medicine?  It can take several days before your stomach pain gets better. Check with your doctor or health care professional if your condition does not start to get better, or if it gets worse.  You may need blood work done while you are taking this medicine.  What side effects may I notice from receiving this medicine?  Side effects that you should report to your doctor or health care professional as soon as possible:  -allergic reactions like skin rash, itching or hives, swelling of the face, lips, or tongue  -bone, muscle or joint pain  -breathing problems  -chest pain or chest tightness  -dark yellow or brown urine  -dizziness  -fast, irregular heartbeat  -feeling faint or lightheaded  -fever or sore throat  -muscle spasm  -palpitations  -redness, blistering, peeling or loosening of the skin, including inside the mouth  -seizures  -tremors  -unusual bleeding or bruising  -unusually   weak or tired  -yellowing of the eyes or skin  Side effects that usually do not require medical attention (Report these to your doctor or health care professional if they continue or are bothersome.):  -constipation  -diarrhea  -dry mouth  -headache  -nausea  This list may not describe all possible side effects. Call your doctor for medical advice about side effects. You may report side effects to FDA at 1-800-FDA-1088.  Where should I keep my medicine?  Keep out of the reach of children.  Store at room  temperature between 15 and 30 degrees C (59 and 86 degrees F). Protect from moisture. Throw away any unused medicine after the expiration date.  NOTE: This sheet is a summary. It may not cover all possible information. If you have questions about this medicine, talk to your doctor, pharmacist, or health care provider.  © 2015, [employer]. (2011-10-18 16:36:20)
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
## 2014-07-08 ENCOUNTER — Encounter: Payer: Self-pay | Admitting: *Deleted

## 2014-07-08 LAB — CBC WITH DIFFERENTIAL/PLATELET
BASOS ABS: 0.1 10*3/uL (ref 0.0–0.1)
BASOS PCT: 1 % (ref 0–1)
Eosinophils Absolute: 0.1 10*3/uL (ref 0.0–0.7)
Eosinophils Relative: 2 % (ref 0–5)
HEMATOCRIT: 39 % (ref 36.0–46.0)
Hemoglobin: 12.6 g/dL (ref 12.0–15.0)
Lymphocytes Relative: 26 % (ref 12–46)
Lymphs Abs: 1.8 10*3/uL (ref 0.7–4.0)
MCH: 30.1 pg (ref 26.0–34.0)
MCHC: 32.3 g/dL (ref 30.0–36.0)
MCV: 93.3 fL (ref 78.0–100.0)
MPV: 12.5 fL — AB (ref 8.6–12.4)
Monocytes Absolute: 0.4 10*3/uL (ref 0.1–1.0)
Monocytes Relative: 6 % (ref 3–12)
NEUTROS ABS: 4.6 10*3/uL (ref 1.7–7.7)
Neutrophils Relative %: 65 % (ref 43–77)
PLATELETS: 225 10*3/uL (ref 150–400)
RBC: 4.18 MIL/uL (ref 3.87–5.11)
RDW: 13.8 % (ref 11.5–15.5)
WBC: 7 10*3/uL (ref 4.0–10.5)

## 2014-07-08 NOTE — Progress Notes (Signed)
Prior Authorization received from [employer] for Chantix. PA completed online [email or web address].  Waiting a response form [employer]. Derl Barrow, RN

## 2014-07-11 NOTE — Progress Notes (Signed)
PA approved from [employer] for Chantix 0.5 mg tablets 04/09/2014-01/04/2015.  [place] aware of approval. Derl Barrow, RN

## 2014-07-13 ENCOUNTER — Telehealth: Payer: Self-pay | Admitting: Family Medicine

## 2014-07-13 NOTE — Telephone Encounter (Signed)
Would like to know results of lab work from last week are ready yet. Please call pt when ready sr

## 2014-07-15 ENCOUNTER — Ambulatory Visit (INDEPENDENT_AMBULATORY_CARE_PROVIDER_SITE_OTHER): Payer: Medicare Other | Admitting: Gastroenterology

## 2014-07-15 ENCOUNTER — Encounter: Payer: Self-pay | Admitting: Gastroenterology

## 2014-07-15 VITALS — BP 108/58 | HR 56 | Ht 64.0 in | Wt 107.4 lb

## 2014-07-15 DIAGNOSIS — R1084 Generalized abdominal pain: Secondary | ICD-10-CM

## 2014-07-15 DIAGNOSIS — R634 Abnormal weight loss: Secondary | ICD-10-CM | POA: Insufficient documentation

## 2014-07-15 NOTE — Patient Instructions (Signed)
You have been scheduled for a CT scan of the abdomen and pelvis at [facility] ([address]---this is in the same building as [facility]).   You are scheduled on 07/18/2014 at 2:00pm. You should arrive 15 minutes prior to your appointment time for registration. Please follow the written instructions below on the day of your exam:  WARNING: IF YOU ARE ALLERGIC TO IODINE/X-RAY DYE, PLEASE NOTIFY RADIOLOGY IMMEDIATELY AT [phone]! YOU WILL BE GIVEN A 13 HOUR PREMEDICATION PREP.  1) Do not or drink anything after  (4 hours prior to your test) 2) You have been given 2 bottles of oral contrast to drink. The solution may taste better if refrigerated, but do NOT add ice or any other liquid to this solution. Shake well before drinking.    Drink 1 bottle of contrast @ 12:00pm (2 hours prior to your exam)  Drink 1 bottle of contrast @ 1:00pm (1 hour prior to your exam)  You may take any medications as prescribed with a small amount of water except for the following: Metformin, Glucophage, Glucovance, Avandamet, Riomet, Fortamet, Actoplus Met, Janumet, Glumetza or Metaglip. The above medications must be held the day of the exam AND 48 hours after the exam.  The purpose of you drinking the oral contrast is to aid in the visualization of your intestinal tract. The contrast solution may cause some diarrhea. Before your exam is started, you will be given a small amount of fluid to drink. Depending on your individual set of symptoms, you may also receive an intravenous injection of x-ray contrast/dye. Plan on being at [facility] for 30 minutes or long, depending on the type of exam you are having performed.  If you have any questions regarding your exam or if you need to reschedule, you may call the CT department at [phone] between the hours of 8:00 am and 5:00 pm, Monday-Friday.  ________________________________________________________________________

## 2014-07-15 NOTE — Telephone Encounter (Signed)
Discussed results with pt.  She went to see GI doctor today, will have CT scan on Monday.    Tamela Oddi Awanda Mink, DO of [facility] 07/15/2014, 4:32 PM
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
## 2014-07-18 ENCOUNTER — Ambulatory Visit (INDEPENDENT_AMBULATORY_CARE_PROVIDER_SITE_OTHER)
Admission: RE | Admit: 2014-07-18 | Discharge: 2014-07-18 | Disposition: A | Discharge status: Home / Self Care | Payer: Medicare Other | Source: Ambulatory Visit | Attending: Internal Medicine | Admitting: Internal Medicine

## 2014-07-18 DIAGNOSIS — R1084 Generalized abdominal pain: Secondary | ICD-10-CM

## 2014-07-18 DIAGNOSIS — R634 Abnormal weight loss: Secondary | ICD-10-CM | POA: Diagnosis not present

## 2014-07-18 MED ORDER — IOHEXOL 300 MG/ML  SOLN
80.0000 mL | Freq: Once | INTRAMUSCULAR | Status: AC | PRN
  Administered 2014-07-18: 80 mL via INTRAVENOUS

## 2014-07-19 ENCOUNTER — Encounter: Payer: Self-pay | Admitting: Gastroenterology

## 2014-07-19 ENCOUNTER — Other Ambulatory Visit: Payer: Self-pay

## 2014-07-19 MED ORDER — DICYCLOMINE HCL 10 MG PO CAPS: 10.0000 mg | ORAL_CAPSULE | Freq: Three times a day (TID) | ORAL | Status: DC

## 2014-07-19 NOTE — Progress Notes (Signed)
i agree with the above note, plan 

## 2014-07-25 ENCOUNTER — Telehealth: Payer: Self-pay | Admitting: Gastroenterology

## 2014-07-25 NOTE — Telephone Encounter (Signed)
Spoke with patient and she states she is taking the Dicyclomine without relief. States her stomach feels like someone punched her when she tries to eat. Please, advise.

## 2014-07-26 NOTE — Telephone Encounter (Signed)
Patient calling again because she never heard back yesterday

## 2014-07-27 ENCOUNTER — Telehealth: Payer: Self-pay | Admitting: Family Medicine

## 2014-07-27 NOTE — Telephone Encounter (Signed)
Pt called and said that the Chantix that she is taking to quit smoking for back surgery is causing side effects that she cannot live with, "Can't eat, stomach pain, etc". Pt would like a phone call from someone to advise her on what to do next / thanks [employer], ASA

## 2014-07-28 ENCOUNTER — Telehealth: Payer: Self-pay | Admitting: Family Medicine

## 2014-07-28 ENCOUNTER — Telehealth: Payer: Self-pay | Admitting: *Deleted

## 2014-07-28 MED ORDER — BUPROPION HCL ER (XL) 150 MG PO TB24: ORAL_TABLET | ORAL | Status: DC

## 2014-07-28 MED ORDER — NICOTINE 21 MG/24HR TD PT24: 21.0000 mg | MEDICATED_PATCH | Freq: Every day | TRANSDERMAL | Status: DC

## 2014-07-28 NOTE — Telephone Encounter (Signed)
Pt called and said that she will be using the patches that her and Dr. Awanda Mink discussed. Please send these in to the pharmacy because they need to be prior authorization. jw

## 2014-07-28 NOTE — Telephone Encounter (Signed)
Please let the patient know that we just received all of her records from [facility] today.  I would like to try to review all of those today before deciding what to do next so hopefully we can get back to her later today.  Thank you,  Jess
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
## 2014-07-28 NOTE — Telephone Encounter (Signed)
Insurance wont Nicoderm--cost ifs $50 per 14 patches. It will cover Wellburtin.  Can she try that? Please advise

## 2014-07-28 NOTE — Telephone Encounter (Signed)
Spoke with pt and informed her of below. Zimmerman Rumple, April D  

## 2014-07-28 NOTE — Telephone Encounter (Signed)
Spoke with patient and gave her recommendations. She is taking Dexilant. Scheduled for EGD on 08/26/14 at 9:30 AM with Dr. Ardis Hughs and pre visit on 08/16/14 at 8:00 AM.

## 2014-07-28 NOTE — Telephone Encounter (Signed)
Patient notified

## 2014-07-28 NOTE — Telephone Encounter (Signed)
Called in.  Pt should understand that it may cause some fast heart rate, headache, dizziness, insomnia.  Should call for next refill if working.   Thanks, Tamela Oddi. Awanda Mink, DO of [facility] 07/28/2014, 1:48 PM

## 2014-07-28 NOTE — Telephone Encounter (Signed)
Called in.  Tamela Oddi Awanda Mink, DO of [facility] 07/28/2014, 11:36 AM

## 2014-07-28 NOTE — Telephone Encounter (Signed)
Pt having dysgeusia from her Chantix.  Discussed using patch, stopping Chantix, and coming in two-three weeks to discuss Wellbutrin XL and possibly seeing pharmacy clinic.  No questions.  Tamela Oddi Awanda Mink, DO of [facility] 07/28/2014, 8:39 AM

## 2014-08-02 ENCOUNTER — Telehealth: Payer: Self-pay | Admitting: Family Medicine

## 2014-08-02 NOTE — Telephone Encounter (Signed)
Pt saw  gasto drs. Did CT scan on stomach. Has been taking butropinol. Her month feels like hot grease Please advise

## 2014-08-03 NOTE — Telephone Encounter (Signed)
Spoke with female at home and asked him to have patient give [place] a call back.  Will need to know if this started after the CT scan.  If so then she should contact the gastro provider to inform them and see what she needs to do. Andree Golphin,CMA

## 2014-08-04 NOTE — Telephone Encounter (Signed)
I'm assuming this is buprenorphine.  < 1% chance of dysgeusia.  Recommend to stop taking medication to see if this goes away.  Tamela Oddi Awanda Mink, DO of [facility] 08/04/2014, 9:32 AM

## 2014-08-04 NOTE — Telephone Encounter (Signed)
Attempted to call again.  No answer and no machine. Paula Duncan, Paula Duncan

## 2014-08-09 ENCOUNTER — Other Ambulatory Visit: Payer: Self-pay | Admitting: *Deleted

## 2014-08-09 MED ORDER — ATORVASTATIN CALCIUM 20 MG PO TABS: 20.0000 mg | ORAL_TABLET | Freq: Every day | ORAL | Status: DC

## 2014-08-15 ENCOUNTER — Other Ambulatory Visit: Payer: Self-pay | Admitting: *Deleted

## 2014-08-15 MED ORDER — PROMETHAZINE HCL 25 MG PO TABS: 25.0000 mg | ORAL_TABLET | Freq: Three times a day (TID) | ORAL | Status: DC | PRN

## 2014-08-16 ENCOUNTER — Ambulatory Visit (AMBULATORY_SURGERY_CENTER): Payer: Self-pay | Admitting: *Deleted

## 2014-08-16 VITALS — Ht 64.0 in | Wt 107.0 lb

## 2014-08-16 DIAGNOSIS — R1084 Generalized abdominal pain: Secondary | ICD-10-CM

## 2014-08-16 DIAGNOSIS — R634 Abnormal weight loss: Secondary | ICD-10-CM

## 2014-08-16 NOTE — Progress Notes (Signed)
No egg or soy allergy  No anesthesia or intubation problems per pt  No diet medications taken  Registered in EMMI   

## 2014-08-26 ENCOUNTER — Ambulatory Visit (AMBULATORY_SURGERY_CENTER): Payer: Medicare Other | Admitting: Gastroenterology

## 2014-08-26 ENCOUNTER — Encounter: Payer: Self-pay | Admitting: Gastroenterology

## 2014-08-26 VITALS — BP 102/53 | HR 85 | Temp 97.0°F | Resp 41 | Ht 64.0 in | Wt 107.0 lb

## 2014-08-26 DIAGNOSIS — I10 Essential (primary) hypertension: Secondary | ICD-10-CM | POA: Diagnosis not present

## 2014-08-26 DIAGNOSIS — K2990 Gastroduodenitis, unspecified, without bleeding: Secondary | ICD-10-CM

## 2014-08-26 DIAGNOSIS — J449 Chronic obstructive pulmonary disease, unspecified: Secondary | ICD-10-CM | POA: Diagnosis not present

## 2014-08-26 DIAGNOSIS — R1084 Generalized abdominal pain: Secondary | ICD-10-CM

## 2014-08-26 DIAGNOSIS — R630 Anorexia: Secondary | ICD-10-CM | POA: Diagnosis not present

## 2014-08-26 DIAGNOSIS — G4733 Obstructive sleep apnea (adult) (pediatric): Secondary | ICD-10-CM | POA: Diagnosis not present

## 2014-08-26 DIAGNOSIS — K2950 Unspecified chronic gastritis without bleeding: Secondary | ICD-10-CM | POA: Diagnosis not present

## 2014-08-26 DIAGNOSIS — F329 Major depressive disorder, single episode, unspecified: Secondary | ICD-10-CM | POA: Diagnosis not present

## 2014-08-26 DIAGNOSIS — M545 Low back pain: Secondary | ICD-10-CM | POA: Diagnosis not present

## 2014-08-26 DIAGNOSIS — R634 Abnormal weight loss: Secondary | ICD-10-CM

## 2014-08-26 DIAGNOSIS — R109 Unspecified abdominal pain: Secondary | ICD-10-CM | POA: Diagnosis not present

## 2014-08-26 DIAGNOSIS — K2970 Gastritis, unspecified, without bleeding: Secondary | ICD-10-CM

## 2014-08-26 MED ORDER — SODIUM CHLORIDE 0.9 % IV SOLN: 500.0000 mL | INTRAVENOUS | Status: DC

## 2014-08-26 NOTE — Progress Notes (Signed)
Report to PACU, RN, vss, BBS= Clear.  

## 2014-08-26 NOTE — Progress Notes (Signed)
Called to room to assist during endoscopic procedure.  Patient ID and intended procedure confirmed with present staff. Received instructions for my participation in the procedure from the performing physician.  
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
## 2014-08-26 NOTE — Patient Instructions (Addendum)
One of your biggest health concerns is your smoking.  This increases your risk for most cancers and serious cardiovascular diseases such as strokes, heart attacks.  You should try your best to stop.  If you need assistance, please contact your PCP or Smoking Cessation Class at [facility] [phone]) or [place] (1-800-QUIT-NOW).  YOU HAD AN ENDOSCOPIC PROCEDURE TODAY AT [facility]:   Refer to the procedure report that was given to you for any specific questions about what was found during the examination.  If the procedure report does not answer your questions, please call your gastroenterologist to clarify.  If you requested that your care partner not be given the details of your procedure findings, then the procedure report has been included in a sealed envelope for you to review at your convenience later.  YOU SHOULD EXPECT: Some feelings of bloating in the abdomen. Passage of more gas than usual.  Walking can help get rid of the air that was put into your GI tract during the procedure and reduce the bloating. If you had a lower endoscopy (such as a colonoscopy or flexible sigmoidoscopy) you may notice spotting of blood in your stool or on the toilet paper. If you underwent a bowel prep for your procedure, you may not have a normal bowel movement for a few days.  Please Note:  You might notice some irritation and congestion in your nose or some drainage.  This is from the oxygen used during your procedure.  There is no need for concern and it should clear up in a day or so.  SYMPTOMS TO REPORT IMMEDIATELY:    Following upper endoscopy (EGD)  Vomiting of blood or coffee ground material  New chest pain or pain under the shoulder blades  Painful or persistently difficult swallowing  New shortness of breath  Fever of 100F or higher  Black, tarry-looking stools  For urgent or emergent issues, a gastroenterologist can be reached at any hour by calling [phone]  [phone].   DIET: Your first meal following the procedure should be a small meal and then it is ok to progress to your normal diet. Heavy or fried foods are harder to digest and may make you feel nauseous or bloated.  Likewise, meals heavy in dairy and vegetables can increase bloating.  Drink plenty of fluids but you should avoid alcoholic beverages for 24 hours.  ACTIVITY:  You should plan to take it easy for the rest of today and you should NOT DRIVE or use heavy machinery until tomorrow (because of the sedation medicines used during the test).    FOLLOW UP: Our staff will call the number listed on your records the next business day following your procedure to check on you and address any questions or concerns that you may have regarding the information given to you following your procedure. If we do not reach you, we will leave a message.  However, if you are feeling well and you are not experiencing any problems, there is no need to return our call.  We will assume that you have returned to your regular daily activities without incident.  If any biopsies were taken you will be contacted by phone or by letter within the next 1-3 weeks.  Please call us at [phone] if you have not heard about the biopsies in 3 weeks.    SIGNATURES/CONFIDENTIALITY: You and/or your care partner have signed paperwork which will be entered into your electronic medical record.  These signatures  attest to the fact that that the information above on your After Visit Summary has been reviewed and is understood.  Full responsibility of the confidentiality of this discharge information lies with you and/or your care-partner.  Read all handouts given to you by your recovery room nurse.

## 2014-08-29 ENCOUNTER — Telehealth: Payer: Self-pay | Admitting: *Deleted

## 2014-08-29 NOTE — Telephone Encounter (Signed)
  Follow up Call-  Call back number 08/26/2014  Post procedure Call Back phone  # [phone]  Permission to leave phone message No  comments NO ANSWERING MACHINE     Patient questions:  Do you have a fever, pain , or abdominal swelling? No. Pain Score  0 *  Have you tolerated food without any problems? No.  Have you been able to return to your normal activities? Yes.    Do you have any questions about your discharge instructions: Diet   No. Medications  No. Follow up visit  No.  Do you have questions or concerns about your Care? No.  Actions: * If pain score is 4 or above: No action needed, pain <4. Paula Duncan answered the telephone call, patient is still sleeping. [name] reports that the patient still without appetite and when she eats she has discomfort. Advised to take Dexilant first thing in am prior to any other foods or liquids.

## 2014-08-30 ENCOUNTER — Ambulatory Visit (INDEPENDENT_AMBULATORY_CARE_PROVIDER_SITE_OTHER): Payer: Medicare Other | Admitting: Family Medicine

## 2014-08-30 ENCOUNTER — Encounter: Payer: Self-pay | Admitting: Family Medicine

## 2014-08-30 VITALS — BP 110/72 | Temp 98.4°F | Ht 64.0 in | Wt 108.0 lb

## 2014-08-30 DIAGNOSIS — M5440 Lumbago with sciatica, unspecified side: Secondary | ICD-10-CM | POA: Diagnosis not present

## 2014-08-30 DIAGNOSIS — E46 Unspecified protein-calorie malnutrition: Secondary | ICD-10-CM | POA: Diagnosis present

## 2014-08-30 DIAGNOSIS — K219 Gastro-esophageal reflux disease without esophagitis: Secondary | ICD-10-CM | POA: Diagnosis not present

## 2014-08-30 MED ORDER — HYDROCODONE-ACETAMINOPHEN 10-325 MG PO TABS: 1.0000 | ORAL_TABLET | Freq: Four times a day (QID) | ORAL | Status: DC | PRN

## 2014-08-30 NOTE — Assessment & Plan Note (Signed)
Continue with Dexilant Start Maalax with meals Decrease acidic foods and increase her high calorie fluids F/U with Dr. Ardis Hughs as directed.  Greatly appreciate recommendations and help in management of this pt.  H Pylori pending at this time

## 2014-08-30 NOTE — Assessment & Plan Note (Signed)
Start multivitamin to help supplement her decreased eating - Will refer to nutritionist to help with increasing her calorie intake/decreasing her Sx from gastritis - Continue taking B12 and Zinc - F/U in 1-2 weeks - Start taking in high calorie/high salt fluids to help with weight gain and keeping her from becoming hypotensive.

## 2014-08-30 NOTE — Progress Notes (Signed)
Paula Duncan is a 53 y.o. female who presents today for ongoing gastritis/epigastric pain.  Epigastric Pain/Gastritis - Ongoing for several months, has lost 20 lbs since December of 2015.  She has had severe pain after eating that has limited her intake.  She does not get this with oral liquids.  Denies fever, chills, sweats at night, or LAD.  She has tried PPI's, changing her diet, and other effects w/o success.  Pt seen by Dr. Ardis Hughs, GI physician, on July 15, 2014 at which time EGD was recommended.  CT of abdomen was performed with contrast on 07/18/14 which did not show acute gastric or bowel causes of this issue.  Last colonoscopy 08/2008 by Dr. Amedeo Plenty at which time 2 polyps were removed (one from transverse and one from ascending colon); were hyperplastic polyps. Last EGD was 07/2009 and was normal. Duodenal biopsies normal/benign as well.  They performed repeat EGD on 08/26/14, which showed gastritis and a Bx for H. Pylori was taken at that time.    Past Medical History  Diagnosis Date  . COPD (chronic obstructive pulmonary disease)     a. nl PFT's in 2014  . PVC (premature ventricular contraction)     a. 2012 Holter: sinus tach and pvc's assoc with dizziness.  . 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 bilateral LE paralysis, resolved;  b. neg neuro w/u for sz in 2010  . Anorexia   . Hyperlipidemia   . Hx of migraines   . Chronic back pain   . Chest pain     a. 12/2009 MV ([facility]): Neg;  b. 03/2013 MV: small, mild reversible defect in the distal PDA, EF 60%.  . H/O sleep apnea     a. 2010: sleep study neg for osa/cataplexy/narcolepsy  . Melena     a. nl EGD  . History of tobacco abuse     a. 30 pack year hx, quit 2013.  Marland Kitchen GERD (gastroesophageal reflux disease)   . Hypertension     History  Smoking status  . Current Every Day Smoker -- 0.50 packs/day for 30 years  . Types: Cigarettes  . Start date: 05/06/2013  Smokeless tobacco  . Never Used    Comment: 08-16-14-  10 cigarettes daily    Family History  Problem Relation Age of Onset  . Alcohol abuse Mother   . Cirrhosis Mother   . Cancer Father     Colon carcinoma, from mets from prostate cancer  . Colon polyps Sister   . Colon cancer Neg Hx   . Esophageal cancer Neg Hx   . Stomach cancer Neg Hx   . Rectal cancer Neg Hx     Current Outpatient Prescriptions on File Prior to Visit  Medication Sig Dispense Refill  . aspirin EC 81 MG tablet Take 1 tablet (81 mg total) by mouth daily. 30 tablet 3  . atenolol (TENORMIN) 25 MG tablet Take 1 tablet (25 mg total) by mouth daily. 30 tablet 5  . atorvastatin (LIPITOR) 20 MG tablet Take 1 tablet (20 mg total) by mouth daily. 30 tablet 5  . buPROPion (WELLBUTRIN XL) 150 MG 24 hr tablet Take 1 tab by mouth every day for three days.  Increase to 2 tabs by mouth once daily 45 tablet 0  . carisoprodol (SOMA) 350 MG tablet Take 1 tablet (350 mg total) by mouth 4 (four) times daily as needed for muscle spasms. 120 tablet 5  . Dexlansoprazole (DEXILANT) 30 MG capsule Take  1 capsule (30 mg total) by mouth daily. (Patient not taking: Reported on 08/16/2014) 30 capsule 1  . dicyclomine (BENTYL) 10 MG capsule Take 1 capsule (10 mg total) by mouth 4 (four) times daily -  before meals and at bedtime. 90 capsule 0  . HYDROcodone-acetaminophen (LORCET HD) 10-325 MG per tablet Take 1 tablet by mouth every 6 (six) hours as needed. 180 tablet 0  . mirtazapine (REMERON) 15 MG tablet Take 1 tablet (15 mg total) by mouth at bedtime. 30 tablet 5  . orphenadrine (NORFLEX) 100 MG tablet Take 1 tablet (100 mg total) by mouth 3 (three) times daily as needed for muscle spasms. (Patient not taking: Reported on 08/26/2014) 90 tablet 11  . promethazine (PHENERGAN) 25 MG tablet Take 1 tablet (25 mg total) by mouth every 8 (eight) hours as needed for nausea. 60 tablet 1  . traZODone (DESYREL) 100 MG tablet Take 1 tablet (100 mg total) by mouth at bedtime. 60 tablet 1  . zolpidem (AMBIEN) 5 MG  tablet Take 1 tablet (5 mg total) by mouth at bedtime as needed for sleep. 90 tablet 0   No current facility-administered medications on file prior to visit.    ROS: Per HPI.  All other systems reviewed and are negative.   Physical Exam Filed Vitals:   08/30/14 1616  BP: 110/72  Temp: 98.4 F (36.9 C)    Physical Examination: General appearance - alert, thin, malnourished  Mental status - alert, oriented to person, place, and time Mouth - MMM, angular chelitis  Abdomen - soft, nontender, nondistended, no masses or organomegaly    Chemistry      Component Value Date/Time   NA 141 07/07/2014 1537   K 4.3 07/07/2014 1537   CL 108 07/07/2014 1537   CO2 29 07/07/2014 1537   BUN 7 07/07/2014 1537   CREATININE 0.88 07/07/2014 1537   CREATININE 1.3* 04/14/2013 1057      Component Value Date/Time   CALCIUM 9.2 07/07/2014 1537   ALKPHOS 115 04/14/2013 1057   AST 23 04/14/2013 1057   ALT 22 04/14/2013 1057   BILITOT 0.5 04/14/2013 1057

## 2014-08-31 ENCOUNTER — Telehealth: Payer: Self-pay | Admitting: Family Medicine

## 2014-08-31 NOTE — Telephone Encounter (Signed)
Discussed with pt.  Should be taking Dexilant 30 mg in AM along with Wellbutrin and Bentyl.  Wellbutrin may cause dysgeusia in about 2-4% of pt so can consider stopping if that happens again.  Thanks, Tamela Oddi. Awanda Mink, DO of [facility] 08/31/2014, 4:31 PM

## 2014-08-31 NOTE — Telephone Encounter (Signed)
Pt is confused about her meds. She wants to know if she is to continue taking wellbutrin and bentyl Please advise

## 2014-09-01 ENCOUNTER — Other Ambulatory Visit: Payer: Self-pay | Admitting: *Deleted

## 2014-09-02 MED ORDER — BUPROPION HCL ER (XL) 300 MG PO TB24: ORAL_TABLET | ORAL | Status: DC

## 2014-09-06 ENCOUNTER — Other Ambulatory Visit: Payer: Self-pay | Admitting: *Deleted

## 2014-09-06 DIAGNOSIS — R1084 Generalized abdominal pain: Secondary | ICD-10-CM

## 2014-09-07 ENCOUNTER — Other Ambulatory Visit: Payer: Self-pay

## 2014-09-07 MED ORDER — DICYCLOMINE HCL 10 MG PO CAPS: 10.0000 mg | ORAL_CAPSULE | Freq: Three times a day (TID) | ORAL | Status: DC

## 2014-09-08 ENCOUNTER — Other Ambulatory Visit: Payer: Self-pay | Admitting: *Deleted

## 2014-09-08 MED ORDER — MIRTAZAPINE 15 MG PO TABS: 15.0000 mg | ORAL_TABLET | Freq: Every day | ORAL | Status: DC

## 2014-09-13 ENCOUNTER — Ambulatory Visit (INDEPENDENT_AMBULATORY_CARE_PROVIDER_SITE_OTHER): Payer: Medicare Other | Admitting: Family Medicine

## 2014-09-13 ENCOUNTER — Encounter: Payer: Self-pay | Admitting: Family Medicine

## 2014-09-13 VITALS — BP 106/44 | HR 75 | Temp 98.6°F | Ht 64.0 in | Wt 106.0 lb

## 2014-09-13 DIAGNOSIS — R1084 Generalized abdominal pain: Secondary | ICD-10-CM

## 2014-09-13 DIAGNOSIS — E46 Unspecified protein-calorie malnutrition: Secondary | ICD-10-CM | POA: Diagnosis not present

## 2014-09-13 DIAGNOSIS — G8929 Other chronic pain: Secondary | ICD-10-CM

## 2014-09-13 DIAGNOSIS — Z7189 Other specified counseling: Secondary | ICD-10-CM

## 2014-09-13 DIAGNOSIS — M5440 Lumbago with sciatica, unspecified side: Secondary | ICD-10-CM | POA: Diagnosis not present

## 2014-09-13 MED ORDER — HYDROCODONE-ACETAMINOPHEN 10-325 MG PO TABS: 1.0000 | ORAL_TABLET | Freq: Four times a day (QID) | ORAL | Status: DC | PRN

## 2014-09-13 NOTE — Progress Notes (Signed)
Paula Duncan is a 54 y.o. female who presents today for ongoing gastritis/epigastric pain.  Epigastric Pain/Gastritis/Protein-Calorie Malnutrition - Ongoing for several months, has lost 20 lbs since December of 2015.  She has had severe pain after eating that has limited her intake.  She does not get this with oral liquids.  Denies fever, chills, sweats at night, or LAD.  She has tried PPI's, changing her diet, and other effects w/o success.  Pt seen by Dr. Ardis Hughs, GI physician, on July 15, 2014 at which time EGD was recommended.  CT of abdomen was performed with contrast on 07/18/14 which did not show acute gastric or bowel causes of this issue.  Last colonoscopy 08/2008 by Dr. Amedeo Plenty at which time 2 polyps were removed (one from transverse and one from ascending colon); were hyperplastic polyps. Last EGD was 07/2009 and was normal. Duodenal biopsies normal/benign as well.  They performed repeat EGD on 08/26/14, which showed gastritis and a Bx for H. Pylori was taken at that time.    Visit 08/30/14 - Pt had been down to 108 lbs, still having some epigastric pain with eating but was not taking the Dexilant at that time.  Referred to dietician for help with increasing calories at that time   Today, she is doing about the same but is eating more.  Started the dexilant again and is scheduled to see the vascular surgeon on 5/17 for mesenteric ischemia w/u as well as nutritionist on 10/10/14  Past Medical History  Diagnosis Date  . COPD (chronic obstructive pulmonary disease)     a. nl PFT's in 2014  . PVC (premature ventricular contraction)     a. 2012 Holter: sinus tach and pvc's assoc with dizziness.  . 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 bilateral LE paralysis, resolved;  b. neg neuro w/u for sz in 2010  . Anorexia   . Hyperlipidemia   . Hx of migraines   . Chronic back pain   . Chest pain     a. 12/2009 MV ([facility]): Neg;  b. 03/2013 MV: small, mild reversible defect in the distal  PDA, EF 60%.  . H/O sleep apnea     a. 2010: sleep study neg for osa/cataplexy/narcolepsy  . Melena     a. nl EGD  . History of tobacco abuse     a. 30 pack year hx, quit 2013.  Marland Kitchen GERD (gastroesophageal reflux disease)   . Hypertension     History  Smoking status  . Current Every Day Smoker -- 0.50 packs/day for 30 years  . Types: Cigarettes  . Start date: 05/06/2013  Smokeless tobacco  . Never Used    Comment: 08-16-14- 10 cigarettes daily    Family History  Problem Relation Age of Onset  . Alcohol abuse Mother   . Cirrhosis Mother   . Cancer Father     Colon carcinoma, from mets from prostate cancer  . Colon polyps Sister   . Colon cancer Neg Hx   . Esophageal cancer Neg Hx   . Stomach cancer Neg Hx   . Rectal cancer Neg Hx     Current Outpatient Prescriptions on File Prior to Visit  Medication Sig Dispense Refill  . aspirin EC 81 MG tablet Take 1 tablet (81 mg total) by mouth daily. 30 tablet 3  . atenolol (TENORMIN) 25 MG tablet Take 1 tablet (25 mg total) by mouth daily. 30 tablet 5  . atorvastatin (LIPITOR) 20 MG tablet Take  1 tablet (20 mg total) by mouth daily. 30 tablet 5  . buPROPion (WELLBUTRIN XL) 300 MG 24 hr tablet 1 tab PO daily 30 tablet 3  . carisoprodol (SOMA) 350 MG tablet Take 1 tablet (350 mg total) by mouth 4 (four) times daily as needed for muscle spasms. 120 tablet 5  . Dexlansoprazole (DEXILANT) 30 MG capsule Take 1 capsule (30 mg total) by mouth daily. (Patient not taking: Reported on 08/16/2014) 30 capsule 1  . dicyclomine (BENTYL) 10 MG capsule Take 1 capsule (10 mg total) by mouth 4 (four) times daily -  before meals and at bedtime. 90 capsule 0  . HYDROcodone-acetaminophen (LORCET HD) 10-325 MG per tablet Take 1 tablet by mouth every 6 (six) hours as needed. 180 tablet 0  . mirtazapine (REMERON) 15 MG tablet Take 1 tablet (15 mg total) by mouth at bedtime. 30 tablet 5  . orphenadrine (NORFLEX) 100 MG tablet Take 1 tablet (100 mg total) by mouth  3 (three) times daily as needed for muscle spasms. (Patient not taking: Reported on 08/26/2014) 90 tablet 11  . promethazine (PHENERGAN) 25 MG tablet Take 1 tablet (25 mg total) by mouth every 8 (eight) hours as needed for nausea. 60 tablet 1  . traZODone (DESYREL) 100 MG tablet Take 1 tablet (100 mg total) by mouth at bedtime. 60 tablet 1  . zolpidem (AMBIEN) 5 MG tablet Take 1 tablet (5 mg total) by mouth at bedtime as needed for sleep. 90 tablet 0   No current facility-administered medications on file prior to visit.    ROS: Per HPI.  All other systems reviewed and are negative.   Physical Exam Filed Vitals:   09/13/14 1419  BP: 106/44  Pulse: 75  Temp: 98.6 F (37 C)    Physical Examination: General appearance - alert, thin, malnourished  Mental status - alert, oriented to person, place, and time Mouth - MMM, angular chelitis  Abdomen - soft, nontender, nondistended, no masses or organomegaly    Chemistry      Component Value Date/Time   NA 141 07/07/2014 1537   K 4.3 07/07/2014 1537   CL 108 07/07/2014 1537   CO2 29 07/07/2014 1537   BUN 7 07/07/2014 1537   CREATININE 0.88 07/07/2014 1537   CREATININE 1.3* 04/14/2013 1057      Component Value Date/Time   CALCIUM 9.2 07/07/2014 1537   ALKPHOS 115 04/14/2013 1057   AST 23 04/14/2013 1057   ALT 22 04/14/2013 1057   BILITOT 0.5 04/14/2013 1057

## 2014-09-13 NOTE — Assessment & Plan Note (Signed)
Previously had endoscopy in beginning of April 2016, showed generalized gastritis along with a negative H Pylori Bx - Started on Dexilant 30 mg qd, Bentyl 10 mg QID  - Referred to vascular surgeon next week for mesenteric ischemia w/u - Will continue to follow

## 2014-09-13 NOTE — Patient Instructions (Signed)
Stop taking the Atenolol  Thanks, Dr. Awanda Mink

## 2014-09-13 NOTE — Assessment & Plan Note (Signed)
Start multivitamin to help supplement her decreased eating - Referral to nutritionist for 10/10/14.   - Stop atenolol at this time, may be making her more orthostatic along with her decreased food/fluid intake - Continue taking B12 and Zinc - Start taking in high calorie/high salt fluids to help with weight gain and keeping her from becoming hypotensive.

## 2014-09-13 NOTE — Assessment & Plan Note (Signed)
Stable - Refill narcotic today

## 2014-09-16 ENCOUNTER — Encounter: Payer: Self-pay | Admitting: Vascular Surgery

## 2014-09-19 ENCOUNTER — Other Ambulatory Visit: Payer: Self-pay | Admitting: Vascular Surgery

## 2014-09-19 DIAGNOSIS — R109 Unspecified abdominal pain: Secondary | ICD-10-CM

## 2014-09-20 ENCOUNTER — Ambulatory Visit (INDEPENDENT_AMBULATORY_CARE_PROVIDER_SITE_OTHER): Payer: Medicare Other | Admitting: Vascular Surgery

## 2014-09-20 ENCOUNTER — Ambulatory Visit (HOSPITAL_COMMUNITY)
Admission: RE | Admit: 2014-09-20 | Discharge: 2014-09-20 | Disposition: A | Discharge status: Home / Self Care | Payer: Medicare Other | Source: Ambulatory Visit | Attending: Vascular Surgery | Admitting: Vascular Surgery

## 2014-09-20 ENCOUNTER — Encounter: Payer: Self-pay | Admitting: Vascular Surgery

## 2014-09-20 ENCOUNTER — Telehealth: Payer: Self-pay | Admitting: Gastroenterology

## 2014-09-20 VITALS — BP 119/55 | HR 124 | Ht 64.0 in | Wt 103.0 lb

## 2014-09-20 DIAGNOSIS — R109 Unspecified abdominal pain: Secondary | ICD-10-CM

## 2014-09-20 DIAGNOSIS — R1084 Generalized abdominal pain: Secondary | ICD-10-CM | POA: Diagnosis not present

## 2014-09-20 NOTE — Telephone Encounter (Signed)
Dr Ardis Hughs please advise I am not really sure what she is asking.  Notes in EPIC.
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
## 2014-09-21 ENCOUNTER — Encounter: Payer: Self-pay | Admitting: Family Medicine

## 2014-09-21 ENCOUNTER — Ambulatory Visit (INDEPENDENT_AMBULATORY_CARE_PROVIDER_SITE_OTHER): Payer: Medicare Other | Admitting: Family Medicine

## 2014-09-21 VITALS — BP 110/57 | HR 118 | Temp 98.1°F | Ht 64.0 in | Wt 102.8 lb

## 2014-09-21 DIAGNOSIS — R1084 Generalized abdominal pain: Secondary | ICD-10-CM

## 2014-09-21 MED ORDER — SUCRALFATE 1 G PO TABS: 1.0000 g | ORAL_TABLET | Freq: Three times a day (TID) | ORAL | Status: DC

## 2014-09-21 NOTE — Telephone Encounter (Signed)
Pt aware and ROV scheduled with Amy for 09/27/14.

## 2014-09-21 NOTE — Progress Notes (Signed)
Patient ID: Paula Duncan, female   DOB: 07-29-1960, 54 y.o.   MRN: [id number]   HPI  Patient presents today for follow-up abdominal pain and weight loss  Initially states that she has ear pain and is sure that "something is crawling around in her ear" - this has been going on for several days.  Patient explains that she's had a 20 pound weight loss since December 2015. She states that it's due to by mouth intolerance as food and fluids now cost pain. She describes her pain is "being hit in the stomach" that starts after she eats and does not go away for 30 minutes plus. She states that previously she could tolerate fluids but now they're starting to cause pain as well, however she is maintaining fluids and urinating adequately.  She's tried Dexilant, Phenergan without improvement. After lengthy discussion Phenergan does help minimally. She's had an EGD which showed an gastritis, biopsies showed mild gastritis and were negative for H. pylori  CT scan of the abdomen recently showed only chronic changes She was seen by vascular surgery this week and chronic mesenteric ischemia was ruled out  PMH Smoking status noted - current every day smoker ROS: Per HPI  Objective: BP 110/57 mmHg  Pulse 118  Temp(Src) 98.1 F (36.7 C) (Oral)  Ht 5\' 4"  (1.626 m)  Wt 102 lb 12.8 oz (46.63 kg)  BMI 17.64 kg/m2 Gen: NAD, alert, cooperative with exam HEENT: NCAT, MMM, right TM WNL, left TM with small heme crust CV: RRR, good S1/S2, no murmur Resp: CTABL, no wheezes, non-labored Abd: Soft, tenderness to palpation along the length of her midline abdominal scar, also tenderness throughout Ext: No edema, warm, 2+ DP pulses bilaterally Neuro: Alert and oriented, No gross deficits  Assessment and plan:  Generalized abdominal pain Continued abdominal pain after a thorough workup. Now states that even liquids or hurting her abdomen that she's not tolerating them as well as she was. Will try Carafate to cut  the stomach and see if we can get some relief Vascular surgery rules out chronic mesenteric ischemia I encouraged her to follow-up with GI as planned she has an appointment in 6 days     Meds ordered this encounter  Medications  . sucralfate (CARAFATE) 1 G tablet    Sig: Take 1 tablet (1 g total) by mouth 4 (four) times daily -  with meals and at bedtime.    Dispense:  60 tablet    Refill:  1

## 2014-09-21 NOTE — Telephone Encounter (Signed)
appt has been made for 11/30/14 945 am.  Pt was notified and states she has lost weight and does not think she can wait that long.  She does not want to see her PCP or extender.  Do you want me to double book or is the appt date and time available ok?

## 2014-09-21 NOTE — Telephone Encounter (Signed)
Next step is rov with me, next available.  Thanks

## 2014-09-21 NOTE — Patient Instructions (Signed)
Great to meet you, I am sorry for your current struggles.   Be sure to take frequent small amounts of fluid, I think that carafate may coat your stomach enough to help you tolerate eating.   Please be sure to follow up with Gi as scheduled  Be sure to get medical help if you are not able to tolerate fluids.

## 2014-09-21 NOTE — Telephone Encounter (Signed)
Please book with extender for next 1-2 weeks, thanks. Also put on weight list for me.

## 2014-09-21 NOTE — Assessment & Plan Note (Signed)
Continued abdominal pain after a thorough workup. Now states that even liquids or hurting her abdomen that she's not tolerating them as well as she was. Will try Carafate to cut the stomach and see if we can get some relief Vascular surgery rules out chronic mesenteric ischemia I encouraged her to follow-up with GI as planned she has an appointment in 6 days

## 2014-09-23 ENCOUNTER — Telehealth: Payer: Self-pay | Admitting: Family Medicine

## 2014-09-23 NOTE — Telephone Encounter (Signed)
Pt was given medication for her stomach, carafate by Dr. Wendi Snipes, pt says this medication makes her vomit. Wants to know what else she can do?

## 2014-09-23 NOTE — Telephone Encounter (Signed)
Returned call  I recommended she keep her GI appt and be seen here or UC if she is unable to tolerate fluids or her symptoms worsen. She asked about the Bx results which we reviewed.   She reports carafate caused emesis.   Laroy Apple, MD [place] Resident, PGY-3 09/23/2014, 2:05 PM

## 2014-09-26 ENCOUNTER — Telehealth: Payer: Self-pay | Admitting: Family Medicine

## 2014-09-26 NOTE — Telephone Encounter (Signed)
Pt needs to know which GI doctor we referred her to last year, she needs to know the name of the doctor

## 2014-09-27 ENCOUNTER — Ambulatory Visit (INDEPENDENT_AMBULATORY_CARE_PROVIDER_SITE_OTHER): Payer: Medicare Other | Admitting: Physician Assistant

## 2014-09-27 ENCOUNTER — Telehealth: Payer: Self-pay | Admitting: Family Medicine

## 2014-09-27 ENCOUNTER — Encounter: Payer: Self-pay | Admitting: Physician Assistant

## 2014-09-27 ENCOUNTER — Other Ambulatory Visit: Payer: Self-pay | Admitting: *Deleted

## 2014-09-27 VITALS — BP 124/72 | HR 80 | Ht 64.0 in | Wt 102.5 lb

## 2014-09-27 DIAGNOSIS — R1033 Periumbilical pain: Secondary | ICD-10-CM

## 2014-09-27 DIAGNOSIS — R634 Abnormal weight loss: Secondary | ICD-10-CM | POA: Diagnosis not present

## 2014-09-27 DIAGNOSIS — R1084 Generalized abdominal pain: Secondary | ICD-10-CM

## 2014-09-27 MED ORDER — TRAZODONE HCL 100 MG PO TABS: 100.0000 mg | ORAL_TABLET | Freq: Every day | ORAL | Status: DC

## 2014-09-27 NOTE — Telephone Encounter (Signed)
Patient wants to know PCP's opinion about taking five bottles of Ensure every day per Dr. Alfredia Ferguson advise. Please, follow up with Patient.

## 2014-09-27 NOTE — Telephone Encounter (Signed)
Long discussion about future options.  To see GI today, discussion about possible scar tissue in abdomen causing this.  If appropriate, will place referral to surgery for further eval for possible scar tissue build up being etiology.  Thanks [employer]. Awanda Mink, DO of [facility] 09/27/2014, 11:52 AM

## 2014-09-27 NOTE — Telephone Encounter (Signed)
Contacted pt to give her the information she requested.  I told her she only had Dr. Ardis Hughs and she stated it wasn't him.  I told her that maybe she should come in and let the doctor determine what should be done with the place around her belly button area. She stated that she had an appt today with Dr. Ardis Hughs and I told her that she should mention it to them while there and they may could help her figure out what route she needed to take.  Otherwise she would need to come in and let the doctor check her out and see what her next steps should be.  Pt understood. Paula Duncan, Paula Duncan D, [place]

## 2014-09-27 NOTE — Telephone Encounter (Signed)
Pt called and would like to speak only to Dr. Awanda Mink about her stomach issues. She said that it is getting worse. jw

## 2014-09-27 NOTE — Patient Instructions (Signed)
We did a referral to [facility] for you to see Dr. Marlou Starks. They will call me with the appointment and I will call you to let you know.
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
## 2014-09-28 ENCOUNTER — Telehealth: Payer: Self-pay | Admitting: Family Medicine

## 2014-09-28 NOTE — Telephone Encounter (Signed)
Paula Duncan is calling because the soonest that she can see Dr. Marlou Starks, surgeon with [facility], would not be until June 13th, she explains that she cannot wait this long because she keeps losing weight and would for her PCP to look for quicker option for the surgery that she may need. Please advise. Thank you,Sadie Reynolds, ASA

## 2014-09-28 NOTE — Progress Notes (Signed)
i agree with the above note, plan 

## 2014-09-28 NOTE — Telephone Encounter (Signed)
We discussed this on the phone yesterday and would advocate for this if tolerates.  Please let her know this as well.   Tamela Oddi Awanda Mink, DO of [facility] 09/28/2014, 9:31 AM

## 2014-09-28 NOTE — Telephone Encounter (Signed)
Pt has called [facility] and they are working her in on Friday (09/30/2014). Thy Gullikson, Salome Spotted

## 2014-09-28 NOTE — Telephone Encounter (Signed)
Unfortunately they are the only surgery group in town.  If she would like [place] to try to get her into [facility] or [facility], I would be glad to replace the referral.  Thanks. Tamela Oddi Awanda Mink, DO of [facility] 09/28/2014, 2:39 PM

## 2014-09-29 NOTE — Telephone Encounter (Signed)
Contacted pt and informed her of below. Zimmerman Rumple, April D, CMA  

## 2014-09-30 ENCOUNTER — Telehealth: Payer: Self-pay | Admitting: Family Medicine

## 2014-09-30 ENCOUNTER — Other Ambulatory Visit: Payer: Self-pay | Admitting: General Surgery

## 2014-09-30 DIAGNOSIS — Z9889 Other specified postprocedural states: Secondary | ICD-10-CM

## 2014-09-30 DIAGNOSIS — K660 Peritoneal adhesions (postprocedural) (postinfection): Secondary | ICD-10-CM

## 2014-09-30 DIAGNOSIS — K2990 Gastroduodenitis, unspecified, without bleeding: Principal | ICD-10-CM

## 2014-09-30 DIAGNOSIS — G8929 Other chronic pain: Secondary | ICD-10-CM | POA: Diagnosis not present

## 2014-09-30 DIAGNOSIS — R1084 Generalized abdominal pain: Secondary | ICD-10-CM | POA: Diagnosis not present

## 2014-09-30 DIAGNOSIS — K2970 Gastritis, unspecified, without bleeding: Secondary | ICD-10-CM

## 2014-09-30 NOTE — Telephone Encounter (Signed)
Contacted pt to clarify what referral she was speaking of and she stated that she went to the doctor today Dr. Marlou Starks at [facility] and she felt like he didn't listen to her and doesn't want to do surgery on her due to her previous stomach surgery, and she wants to be seen by someone who will listen to her.  Stated she is still losing weight and not eating and just wants her life back.  I told her that I would send this to the PCP and see if there is another place she can be sent to.  From her previous message she is looking for someone in the [place] area. Please advise. Paula Duncan, Paula Duncan D, [place]

## 2014-09-30 NOTE — Telephone Encounter (Signed)
Patient request PCP to sent referral to a doctor in [facility]. Please, follow up with  Patient.

## 2014-09-30 NOTE — Telephone Encounter (Signed)
Referral in.  Please refer to [facility] for general surgery.  Thanks [employer]. Awanda Mink, DO of [facility] 09/30/2014, 1:48 PM

## 2014-10-06 ENCOUNTER — Telehealth: Payer: Self-pay | Admitting: Family Medicine

## 2014-10-06 ENCOUNTER — Ambulatory Visit
Admission: RE | Admit: 2014-10-06 | Discharge: 2014-10-06 | Disposition: A | Discharge status: Home / Self Care | Payer: Medicare Other | Source: Ambulatory Visit | Attending: General Surgery | Admitting: General Surgery

## 2014-10-06 DIAGNOSIS — K5900 Constipation, unspecified: Secondary | ICD-10-CM | POA: Diagnosis not present

## 2014-10-06 DIAGNOSIS — K2970 Gastritis, unspecified, without bleeding: Secondary | ICD-10-CM | POA: Diagnosis not present

## 2014-10-06 NOTE — Telephone Encounter (Signed)
Had barium GI today. Would like results as soon as possible

## 2014-10-10 ENCOUNTER — Ambulatory Visit: Payer: Medicare Other | Admitting: Dietician

## 2014-10-10 ENCOUNTER — Telehealth: Payer: Self-pay | Admitting: *Deleted

## 2014-10-10 ENCOUNTER — Other Ambulatory Visit: Payer: Self-pay | Admitting: *Deleted

## 2014-10-10 MED ORDER — CARISOPRODOL 350 MG PO TABS: 350.0000 mg | ORAL_TABLET | Freq: Four times a day (QID) | ORAL | Status: DC | PRN

## 2014-10-10 NOTE — Telephone Encounter (Signed)
Notified pt rx had been called in per her request. Katharina Caper, April D, CMA

## 2014-10-10 NOTE — Telephone Encounter (Signed)
Paper Rx given to me by md and pt requested it be called in. Rx called in to pharmacy and pt notified. Katharina Caper, April D, [place]

## 2014-10-11 ENCOUNTER — Encounter: Payer: Self-pay | Admitting: Family Medicine

## 2014-10-11 ENCOUNTER — Ambulatory Visit (INDEPENDENT_AMBULATORY_CARE_PROVIDER_SITE_OTHER): Payer: Medicare Other | Admitting: Family Medicine

## 2014-10-11 VITALS — BP 137/66 | HR 105 | Temp 98.0°F | Ht 64.0 in | Wt 109.0 lb

## 2014-10-11 DIAGNOSIS — M5440 Lumbago with sciatica, unspecified side: Secondary | ICD-10-CM | POA: Diagnosis not present

## 2014-10-11 DIAGNOSIS — E46 Unspecified protein-calorie malnutrition: Secondary | ICD-10-CM | POA: Diagnosis present

## 2014-10-11 DIAGNOSIS — R1084 Generalized abdominal pain: Secondary | ICD-10-CM | POA: Diagnosis not present

## 2014-10-11 MED ORDER — HYDROCODONE-ACETAMINOPHEN 10-325 MG PO TABS: 1.0000 | ORAL_TABLET | Freq: Four times a day (QID) | ORAL | Status: DC | PRN

## 2014-10-11 NOTE — Progress Notes (Signed)
Paula Duncan is a 54 y.o. female who presents today for ongoing gastritis/epigastric pain.  Epigastric Pain/Gastritis/Protein-Calorie Malnutrition - Ongoing for several months, has lost 20 lbs since December of 2015.  She has had severe pain after eating that has limited her intake.  She does not get this with oral liquids.  Denies fever, chills, sweats at night, or LAD.  She has tried PPI's, changing her diet, and other effects w/o success.  Pt seen by Dr. Ardis Hughs, GI physician, on July 15, 2014 at which time EGD was recommended.  CT of abdomen was performed with contrast on 07/18/14 which did not show acute gastric or bowel causes of this issue.  Last colonoscopy 08/2008 by Dr. Amedeo Plenty at which time 2 polyps were removed (one from transverse and one from ascending colon); were hyperplastic polyps. Last EGD was 07/2009 and was normal. Duodenal biopsies normal/benign as well.  They performed repeat EGD on 08/26/14, which showed gastritis and a Bx for H. Pylori was taken at that time.    Visit 08/30/14 - Pt had been down to 108 lbs, still having some epigastric pain with eating but was not taking the Dexilant at that time.  Referred to dietician for help with increasing calories at that time   Visit 09/13/14 she is doing about the same but is eating more.  Started the dexilant again and is scheduled to see the vascular surgeon on 5/17 for mesenteric ischemia w/u as well as nutritionist on 10/10/14  10/11/14 - Pt doing much better since last visit and weight is up 7 lbs.  She is eating more as well, stating she feels better with less pain.  Saw vascular surgery and they did not think this was mesenteric ischemia.  Was referred to general surgery, for possible adhesions, and they performed UGI series which was basically normal.  She continues to take the dexilant and is doing well with eating.  Denies recent abdominal pain.  Has seen nutritionist as well, which she is taking Ensure multiple times per day.   Past Medical  History  Diagnosis Date  . COPD (chronic obstructive pulmonary disease)     a. nl PFT's in 2014  . PVC (premature ventricular contraction)     a. 2012 Holter: sinus tach and pvc's assoc with dizziness.  . 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 bilateral LE paralysis, resolved;  b. neg neuro w/u for sz in 2010  . Anorexia   . Hyperlipidemia   . Hx of migraines   . Chronic back pain   . Chest pain     a. 12/2009 MV ([facility]): Neg;  b. 03/2013 MV: small, mild reversible defect in the distal PDA, EF 60%.  . H/O sleep apnea     a. 2010: sleep study neg for osa/cataplexy/narcolepsy  . Melena     a. nl EGD  . History of tobacco abuse     a. 30 pack year hx, quit 2013.  Marland Kitchen GERD (gastroesophageal reflux disease)   . Hypertension     History  Smoking status  . Current Every Day Smoker -- 0.50 packs/day for 30 years  . Types: Cigarettes  . Start date: 05/06/2013  Smokeless tobacco  . Never Used    Comment: 08-16-14- 10 cigarettes daily    Family History  Problem Relation Age of Onset  . Alcohol abuse Mother   . Cirrhosis Mother   . Cancer Father     Colon carcinoma, from mets from prostate cancer  .  Colon polyps Sister   . Colon cancer Neg Hx   . Esophageal cancer Neg Hx   . Stomach cancer Neg Hx   . Rectal cancer Neg Hx     Current Outpatient Prescriptions on File Prior to Visit  Medication Sig Dispense Refill  . aspirin EC 81 MG tablet Take 1 tablet (81 mg total) by mouth daily. 30 tablet 3  . atorvastatin (LIPITOR) 20 MG tablet Take 1 tablet (20 mg total) by mouth daily. 30 tablet 5  . carisoprodol (SOMA) 350 MG tablet Take 1 tablet (350 mg total) by mouth 4 (four) times daily as needed for muscle spasms. 120 tablet 5  . HYDROcodone-acetaminophen (LORCET HD) 10-325 MG per tablet Take 1 tablet by mouth every 6 (six) hours as needed. 180 tablet 0  . mirtazapine (REMERON) 15 MG tablet Take 1 tablet (15 mg total) by mouth at bedtime. 30 tablet 5  . orphenadrine  (NORFLEX) 100 MG tablet Take 1 tablet (100 mg total) by mouth 3 (three) times daily as needed for muscle spasms. 90 tablet 11  . promethazine (PHENERGAN) 25 MG tablet Take 1 tablet (25 mg total) by mouth every 8 (eight) hours as needed for nausea. 60 tablet 1  . traZODone (DESYREL) 100 MG tablet Take 1 tablet (100 mg total) by mouth at bedtime. 60 tablet 1  . zolpidem (AMBIEN) 5 MG tablet Take 1 tablet (5 mg total) by mouth at bedtime as needed for sleep. 90 tablet 0   No current facility-administered medications on file prior to visit.    ROS: Per HPI.  All other systems reviewed and are negative.   Physical Exam Filed Vitals:   10/11/14 1050  BP: 137/66  Pulse: 105  Temp: 98 F (36.7 C)    Physical Examination: General appearance - alert, thin, malnourished  Mental status - alert, oriented to person, place, and time Mouth - MMM, angular chelitis  Abdomen - soft, nontender, nondistended, no masses or organomegaly    Chemistry      Component Value Date/Time   NA 141 07/07/2014 1537   K 4.3 07/07/2014 1537   CL 108 07/07/2014 1537   CO2 29 07/07/2014 1537   BUN 7 07/07/2014 1537   CREATININE 0.88 07/07/2014 1537   CREATININE 1.3* 04/14/2013 1057      Component Value Date/Time   CALCIUM 9.2 07/07/2014 1537   ALKPHOS 115 04/14/2013 1057   AST 23 04/14/2013 1057   ALT 22 04/14/2013 1057   BILITOT 0.5 04/14/2013 1057

## 2014-10-11 NOTE — Assessment & Plan Note (Signed)
Improving abdominal pain after a thorough workup. Vascular surgery rules out chronic mesenteric ischemia GI has not found anything with UGI, Endoscopy, or small bowel follow through Surgery does not believe this is adhesions She is currently eating and pain is improved so will continue to monitor.

## 2014-10-11 NOTE — Assessment & Plan Note (Signed)
Continue multivitamin to help supplement her decreased eating - Follow up with nutritionist  - Continue taking B12 and Zinc - Continue taking in high calorie/high salt fluids to help with weight gain and keeping her from becoming hypotensive.

## 2014-10-28 ENCOUNTER — Ambulatory Visit (INDEPENDENT_AMBULATORY_CARE_PROVIDER_SITE_OTHER): Payer: Medicare Other | Admitting: Family Medicine

## 2014-10-28 ENCOUNTER — Encounter: Payer: Self-pay | Admitting: Family Medicine

## 2014-10-28 VITALS — BP 144/71 | HR 110 | Temp 98.8°F | Ht 64.0 in | Wt 103.0 lb

## 2014-10-28 DIAGNOSIS — R1084 Generalized abdominal pain: Secondary | ICD-10-CM | POA: Diagnosis present

## 2014-10-28 NOTE — Progress Notes (Signed)
Paula Duncan is a 54 y.o. female who presents today for ongoing gastritis/epigastric pain.  Epigastric Pain/Gastritis/Protein-Calorie Malnutrition - Ongoing for several months, has lost 20 lbs since December of 2015.  She has had severe pain after eating that has limited her intake.  She does not get this with oral liquids.  Denies fever, chills, sweats at night, or LAD.  She has tried PPI's, changing her diet, and other effects w/o success.  Pt seen by Dr. Ardis Hughs, GI physician, on July 15, 2014 at which time EGD was recommended.  CT of abdomen was performed with contrast on 07/18/14 which did not show acute gastric or bowel causes of this issue.  Last colonoscopy 08/2008 by Dr. Amedeo Plenty at which time 2 polyps were removed (one from transverse and one from ascending colon); were hyperplastic polyps. Last EGD was 07/2009 and was normal. Duodenal biopsies normal/benign as well.  They performed repeat EGD on 08/26/14, which showed gastritis and a Bx for H. Pylori was taken at that time.    Visit 08/30/14 - Pt had been down to 108 lbs, still having some epigastric pain with eating but was not taking the Dexilant at that time.  Referred to dietician for help with increasing calories at that time   Visit 09/13/14 she is doing about the same but is eating more.  Started the dexilant again and is scheduled to see the vascular surgeon on 5/17 for mesenteric ischemia w/u as well as nutritionist on 10/10/14  10/11/14 - Pt doing much better since last visit and weight is up 7 lbs.  She is eating more as well, stating she feels better with less pain.  Saw vascular surgery and they did not think this was mesenteric ischemia.  Was referred to general surgery, for possible adhesions, and they performed UGI series which was basically normal.  She continues to take the dexilant and is doing well with eating.  Denies recent abdominal pain.  Has seen nutritionist as well, which she is taking Ensure multiple times per day.   10/28/14 - Pt  started having epigastric/umbilical pain again about 4 days ago.  Denies inciting event but did go to beach last week.  No new introductions into her diet.  Pain feels very similar to previous.  Has not been able to eat in the past two days and is down about 5 lbs since last visit. No melena, hematochezia, fever, chills, sweats.    Past Medical History  Diagnosis Date  . COPD (chronic obstructive pulmonary disease)     a. nl PFT's in 2014  . PVC (premature ventricular contraction)     a. 2012 Holter: sinus tach and pvc's assoc with dizziness.  . 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 bilateral LE paralysis, resolved;  b. neg neuro w/u for sz in 2010  . Anorexia   . Hyperlipidemia   . Hx of migraines   . Chronic back pain   . Chest pain     a. 12/2009 MV ([facility]): Neg;  b. 03/2013 MV: small, mild reversible defect in the distal PDA, EF 60%.  . H/O sleep apnea     a. 2010: sleep study neg for osa/cataplexy/narcolepsy  . Melena     a. nl EGD  . History of tobacco abuse     a. 30 pack year hx, quit 2013.  Marland Kitchen GERD (gastroesophageal reflux disease)   . Hypertension     History  Smoking status  . Current Every Day Smoker --  0.50 packs/day for 30 years  . Types: Cigarettes  . Start date: 05/06/2013  Smokeless tobacco  . Never Used    Comment: 08-16-14- 10 cigarettes daily    Family History  Problem Relation Age of Onset  . Alcohol abuse Mother   . Cirrhosis Mother   . Cancer Father     Colon carcinoma, from mets from prostate cancer  . Colon polyps Sister   . Colon cancer Neg Hx   . Esophageal cancer Neg Hx   . Stomach cancer Neg Hx   . Rectal cancer Neg Hx     Current Outpatient Prescriptions on File Prior to Visit  Medication Sig Dispense Refill  . aspirin EC 81 MG tablet Take 1 tablet (81 mg total) by mouth daily. 30 tablet 3  . atorvastatin (LIPITOR) 20 MG tablet Take 1 tablet (20 mg total) by mouth daily. 30 tablet 5  . carisoprodol (SOMA) 350 MG tablet  Take 1 tablet (350 mg total) by mouth 4 (four) times daily as needed for muscle spasms. 120 tablet 5  . HYDROcodone-acetaminophen (LORCET HD) 10-325 MG per tablet Take 1 tablet by mouth every 6 (six) hours as needed. 180 tablet 0  . mirtazapine (REMERON) 15 MG tablet Take 1 tablet (15 mg total) by mouth at bedtime. 30 tablet 5  . orphenadrine (NORFLEX) 100 MG tablet Take 1 tablet (100 mg total) by mouth 3 (three) times daily as needed for muscle spasms. 90 tablet 11  . promethazine (PHENERGAN) 25 MG tablet Take 1 tablet (25 mg total) by mouth every 8 (eight) hours as needed for nausea. 60 tablet 1  . traZODone (DESYREL) 100 MG tablet Take 1 tablet (100 mg total) by mouth at bedtime. 60 tablet 1  . zolpidem (AMBIEN) 5 MG tablet Take 1 tablet (5 mg total) by mouth at bedtime as needed for sleep. 90 tablet 0   No current facility-administered medications on file prior to visit.    ROS: Per HPI.  All other systems reviewed and are negative.   Physical Exam Filed Vitals:   10/28/14 1103  BP: 144/71  Pulse: 110  Temp: 98.8 F (37.1 C)    Physical Examination: General appearance - alert, thin, malnourished  Mental status - alert, oriented to person, place, and time Mouth - MMM, angular chelitis  Abdomen - soft, nontender, nondistended, no masses or organomegaly    Chemistry      Component Value Date/Time   NA 141 07/07/2014 1537   K 4.3 07/07/2014 1537   CL 108 07/07/2014 1537   CO2 29 07/07/2014 1537   BUN 7 07/07/2014 1537   CREATININE 0.88 07/07/2014 1537   CREATININE 1.3* 04/14/2013 1057      Component Value Date/Time   CALCIUM 9.2 07/07/2014 1537   ALKPHOS 115 04/14/2013 1057   AST 23 04/14/2013 1057   ALT 22 04/14/2013 1057   BILITOT 0.5 04/14/2013 1057

## 2014-10-28 NOTE — Progress Notes (Signed)
Complaining of loss wt No appetite no abdomina pain

## 2014-10-28 NOTE — Assessment & Plan Note (Addendum)
Paula Duncan has had reoccurrence of her abdominal pain.   Vascular surgery rules out chronic mesenteric ischemia GI has not found anything with UGI, Endoscopy, or small bowel follow through Surgery does not believe this is adhesions.  Will have her see [facility] surgeons to further evaluate possible etiology of this cause.  Otherwise has GI appointment in late July.   I currently have no clue what could be causing this.  Major life threatening conditions including vascular origin, gastric cancer, ulcerations have all been negative w/u to date.

## 2014-11-01 ENCOUNTER — Other Ambulatory Visit: Payer: Self-pay | Admitting: *Deleted

## 2014-11-01 DIAGNOSIS — G4700 Insomnia, unspecified: Secondary | ICD-10-CM

## 2014-11-01 MED ORDER — ZOLPIDEM TARTRATE 5 MG PO TABS: 5.0000 mg | ORAL_TABLET | Freq: Every evening | ORAL | Status: DC | PRN

## 2014-11-02 ENCOUNTER — Telehealth: Payer: Self-pay | Admitting: Family Medicine

## 2014-11-02 NOTE — Telephone Encounter (Signed)
Pt has figured out why she has lost so much weight. She thinks it is the medicine is in on for smoking Can dr Awanda Mink think of something that will not have those side effects?

## 2014-11-03 NOTE — Telephone Encounter (Signed)
Contacted pt and informed her of below and she stated that she stopped taking the medicine and said in doing so she decided that it wasn't the medicine after all that was making her lose weight.  I told her that I would let the doctor know that nothing needed to be changed per pt. Katharina Caper, Amiyah Shryock D, [place]

## 2014-11-03 NOTE — Telephone Encounter (Signed)
Does have 15-25% incidence of weight loss so could switch to nicotine patch/gum if still issue.  Thanks [employer]. Awanda Mink, DO of [facility] 11/03/2014, 8:44 AM

## 2014-11-21 ENCOUNTER — Other Ambulatory Visit: Payer: Self-pay | Admitting: *Deleted

## 2014-11-21 DIAGNOSIS — R1013 Epigastric pain: Secondary | ICD-10-CM

## 2014-11-23 DIAGNOSIS — K432 Incisional hernia without obstruction or gangrene: Secondary | ICD-10-CM | POA: Diagnosis not present

## 2014-11-29 ENCOUNTER — Other Ambulatory Visit: Payer: Self-pay | Admitting: Family Medicine

## 2014-11-29 NOTE — Telephone Encounter (Signed)
I have not assessed pt. She will follow up with GI tomorrow and I will defer medication choices to them.

## 2014-11-29 NOTE — Telephone Encounter (Signed)
Pt called and needs a refill on her Dexilant and Promethazine called in. jw

## 2014-11-30 ENCOUNTER — Ambulatory Visit: Payer: Medicare Other | Admitting: Gastroenterology

## 2014-11-30 NOTE — Telephone Encounter (Signed)
No, she has (had) an appointment with Dr. Ardis Hughs at [facility] for "follow up abdominal pain" today at 9:45am. And by my count she's had 3 visits there in the past few months. I don't see the use in managing her GI conditions if we've referred her to GI and she has follow up there. She needs to schedule, and present for, an appointment with them.

## 2014-11-30 NOTE — Telephone Encounter (Signed)
Spoke to pt. She has not seen the GI doctor. She has only gone for test. Dr. Awanda Mink was the Dr. She was seeing for meds. Ottis Stain, CMA

## 2014-12-02 NOTE — Telephone Encounter (Signed)
Spoke to pt for approx 15 mins.  She was unsatisfied with the response to contact GI office. She said she did not have an appt on the 11/30/2014. She said the GI Dr was to only run the test. Dr. Awanda Mink always gave her the Rx for her stomach.  I tried to explain again that the GI Dr.would be the person to take over managing her abdominal issues.  She was unsatisfied with that answer and wanted an appt with Dr. Bonner Puna.  Ottis Stain, CMA

## 2014-12-06 ENCOUNTER — Encounter: Payer: Self-pay | Admitting: Family Medicine

## 2014-12-06 ENCOUNTER — Ambulatory Visit (INDEPENDENT_AMBULATORY_CARE_PROVIDER_SITE_OTHER): Payer: Medicare Other | Admitting: Family Medicine

## 2014-12-06 VITALS — BP 121/54 | HR 86 | Temp 97.9°F | Ht 64.0 in | Wt 108.5 lb

## 2014-12-06 DIAGNOSIS — Z7189 Other specified counseling: Secondary | ICD-10-CM

## 2014-12-06 DIAGNOSIS — M5440 Lumbago with sciatica, unspecified side: Secondary | ICD-10-CM | POA: Diagnosis not present

## 2014-12-06 DIAGNOSIS — G8929 Other chronic pain: Secondary | ICD-10-CM

## 2014-12-06 DIAGNOSIS — G4700 Insomnia, unspecified: Secondary | ICD-10-CM | POA: Diagnosis not present

## 2014-12-06 DIAGNOSIS — R634 Abnormal weight loss: Secondary | ICD-10-CM

## 2014-12-06 DIAGNOSIS — K589 Irritable bowel syndrome without diarrhea: Secondary | ICD-10-CM | POA: Diagnosis not present

## 2014-12-06 DIAGNOSIS — R1084 Generalized abdominal pain: Secondary | ICD-10-CM

## 2014-12-06 MED ORDER — DICYCLOMINE HCL 20 MG PO TABS: 20.0000 mg | ORAL_TABLET | Freq: Three times a day (TID) | ORAL | Status: DC

## 2014-12-06 MED ORDER — DEXLANSOPRAZOLE 30 MG PO CPDR: 30.0000 mg | DELAYED_RELEASE_CAPSULE | Freq: Every day | ORAL | Status: DC

## 2014-12-06 MED ORDER — ORPHENADRINE CITRATE ER 100 MG PO TB12: 100.0000 mg | ORAL_TABLET | Freq: Three times a day (TID) | ORAL | Status: DC | PRN

## 2014-12-06 MED ORDER — HYDROCODONE-ACETAMINOPHEN 10-325 MG PO TABS: 1.0000 | ORAL_TABLET | Freq: Four times a day (QID) | ORAL | Status: DC | PRN

## 2014-12-06 MED ORDER — TRAZODONE HCL 100 MG PO TABS: 100.0000 mg | ORAL_TABLET | Freq: Every day | ORAL | Status: DC

## 2014-12-06 MED ORDER — ZOLPIDEM TARTRATE 5 MG PO TABS: 5.0000 mg | ORAL_TABLET | Freq: Every evening | ORAL | Status: DC | PRN

## 2014-12-06 MED ORDER — PROMETHAZINE HCL 25 MG PO TABS: 25.0000 mg | ORAL_TABLET | Freq: Three times a day (TID) | ORAL | Status: DC | PRN

## 2014-12-06 NOTE — Assessment & Plan Note (Signed)
Some features of this and no interventions targeted at this have been tried. Alternative malicious etiologies have been ruled out. Pt was educated extensively about the risks of both chronic phenergan and PPI use and opts to continue these because the risk of not eating is greater than risk of adverse medication effects.  - Therapeutic 1 week trial of bentyl.

## 2014-12-06 NOTE — Assessment & Plan Note (Signed)
See overview.   

## 2014-12-06 NOTE — Patient Instructions (Signed)
Start taking bentyl 4 times a day for 7 days to see if this helps your pain and lets you eat. I have refilled the rest of your medications.   It really was nice to meet you.  - Dr. Bonner Puna

## 2014-12-06 NOTE — Progress Notes (Signed)
Subjective: Paula Duncan is a 54 y.o. female with a history of abdominal pain presenting for follow up of abdominal pain.   She is new to me but well known to the practice. All chart records reviewed, briefly, she has had longstanding back pain which was not improved by spinal fusion and is maintained on chronic narcotics and muscle relaxants. She has had abdominal pain which has caused her to intermittently stop eating - investigation for this has included  Pain is generalized to the whole stomach, after eating (solids > liquids), severe, better only with phenergan and dexilant, not improved in the past by pepto bidmol, zantac, alka-seltzer, diet modifications, lifestyle modifications, or tums. She was evaluated by GI, Dr. Ardis Hughs, with CT abdomen and EGD in 2011 which were negative. Repeat  EGD positive for gastritis with negative H. pylori gastric and duodenal biopsies. She's had 2 hyperplastic polyps removed at colonoscopy in 2010 with Dr. Amedeo Plenty. Vascular surgeon reported that this is not mesenteric ischemia. Upper GI series by general surgeons was essentially normal (has h/o abdominal surgeries). She continues to see a nutritionist and taking ensure three times a day. She has not received a diagnosis. Has not tried bentyl or rifaximin.   Today she endorses all of the above symptoms which has been stable. Denies fever, chills, night sweats, recent weight loss (reported nadir 98lbs, today is 108lbs). No changes in bowel caliber/frequency/character, no melena, hematochezia, dysphagia, hematemesis. Has regular BMs (occasional constipation, but this is related to decreased intake). Her symptoms are all worse recently since she ran out of phenergan and dexilant. Pain is sometimes relieved by BM.   - Current everyday smoker: Absolutely declines discussion of cessation at this time.   Objective: BP 121/54 mmHg  Pulse 86  Temp(Src) 97.9 F (36.6 C) (Oral)  Ht 5\' 4"  (1.626 m)  Wt 108 lb 8 oz (49.215 kg)   BMI 18.61 kg/m2 Gen: Chronically ill-appearing very thin 54 y.o. female in no distress GI: Normoactive-BS; soft, non-distended, no organomegaly, no hernia appreciated. Tender generally x4 quadrants without rebound or guarding.  Back:  Normal skin. Spine with normal alignment with overlying surgical scars. No tenderness to vertebral process palpation. Paraspinous muscles are tender and with significant bilateral spasm. Range of motion is limited globally. Straight leg raise is negative. Neuro:  Sensation and motor function 5/5 bilateral lower extremities. Patellar and achilles DTR's 2+  Assessment/Plan: Paula Duncan is a 54 y.o. female here for chronic back pain and abdominal pain. .  See problem list for plan.

## 2014-12-06 NOTE — Assessment & Plan Note (Addendum)
Overview noted. Symptoms stable and denies amnestic events. Continue trazodone and ambien 5mg  (knows this is max dose for her).

## 2014-12-06 NOTE — Assessment & Plan Note (Signed)
Refractory to surgical management and maintained quality of life on chronic opioids and antispasmodics. Will refill these today.

## 2014-12-27 ENCOUNTER — Telehealth: Payer: Self-pay | Admitting: *Deleted

## 2014-12-27 NOTE — Telephone Encounter (Signed)
Patient states she has had a sinus infection now for over a week and would like MD to send antibiotic to her pharmacy. Explained to patient that she would need to be seen prior to having an antibiotic called in but patient states she knows what she has and that MD has called her in an antibiotic before for this. Again explained policy to patient but informed her I would send a message to her provider.

## 2014-12-27 NOTE — Telephone Encounter (Signed)
Message noted. You are correct, she will need to be seen before any treatments are rendered.

## 2014-12-29 NOTE — Telephone Encounter (Signed)
PT informed. Zimmerman Rumple, April D, CMA  

## 2015-01-23 ENCOUNTER — Ambulatory Visit (INDEPENDENT_AMBULATORY_CARE_PROVIDER_SITE_OTHER): Payer: Medicare Other | Admitting: Family Medicine

## 2015-01-23 ENCOUNTER — Encounter: Payer: Self-pay | Admitting: Family Medicine

## 2015-01-23 VITALS — BP 112/69 | HR 100 | Temp 98.0°F | Wt 98.9 lb

## 2015-01-23 DIAGNOSIS — M5442 Lumbago with sciatica, left side: Secondary | ICD-10-CM

## 2015-01-23 DIAGNOSIS — G894 Chronic pain syndrome: Secondary | ICD-10-CM | POA: Diagnosis not present

## 2015-01-23 DIAGNOSIS — Z7189 Other specified counseling: Secondary | ICD-10-CM

## 2015-01-23 DIAGNOSIS — J019 Acute sinusitis, unspecified: Secondary | ICD-10-CM | POA: Insufficient documentation

## 2015-01-23 DIAGNOSIS — G8929 Other chronic pain: Secondary | ICD-10-CM

## 2015-01-23 DIAGNOSIS — M5441 Lumbago with sciatica, right side: Secondary | ICD-10-CM

## 2015-01-23 DIAGNOSIS — M5440 Lumbago with sciatica, unspecified side: Secondary | ICD-10-CM

## 2015-01-23 DIAGNOSIS — J0110 Acute frontal sinusitis, unspecified: Secondary | ICD-10-CM | POA: Diagnosis present

## 2015-01-23 MED ORDER — HYDROCODONE-ACETAMINOPHEN 10-325 MG PO TABS
1.0000 | ORAL_TABLET | Freq: Four times a day (QID) | ORAL | Status: DC | PRN
Start: 2015-01-23 — End: 2015-02-27

## 2015-01-23 MED ORDER — AMOXICILLIN-POT CLAVULANATE 875-125 MG PO TABS: 1.0000 | ORAL_TABLET | Freq: Two times a day (BID) | ORAL | Status: DC

## 2015-01-23 NOTE — Assessment & Plan Note (Signed)
Consistent with subacute rhinosinusitis, gradual worsening without prior improvement, seems more concerning for sinusitis may have progressed to bacterial now, not entirely consistent with prior viral URI or allergy. Currently well appearing, afebrile, +tender sinuses b/l w/o purulence. Similar to prior sinusitis.  Plan: 1. Start Augmentin 875-125mg  PO BID x 10 days 2. Supportive care with nasal saline OTC, hydration, and OTC meds as discusse 3. Return criteria reviewed

## 2015-01-23 NOTE — Progress Notes (Signed)
Subjective:    Patient ID: Paula Duncan, female    DOB: 03-01-1961, 54 y.o.   MRN: [id number]  Paula Duncan is a 54 y.o. female presenting on 01/23/2015 for Sinusitis  Patient presents for a same day appointment.  HPI  SINUSITIS: - Reported about 2-3 weeks ago symptoms started with some facial pain and pressure, consistent with prior sinus infections, similar time of year usually with weather change in Fall. Admits to having known COPD with occasional cough. Denies significant congestion or other URI symptoms. No known sick contacts. - Tried OTC anti-histamine without relief - Chart review last sinus infection 05/2014, resolved after treatment with augmentin  CHRONIC LOW BACK PAIN: - Additionally patient requests a refill on her chronic Hydrocodone 10/325, last office visit on 12/06/14 with #180, 0 refills for 45 day supply at that time, she has a pain [facility]. It has been >45 days since this visit, and she was unable to get an appointment with her PCP and had this SDA today.  Past Medical History  Diagnosis Date  . COPD (chronic obstructive pulmonary disease)     a. nl PFT's in 2014  . PVC (premature ventricular contraction)     a. 2012 Holter: sinus tach and pvc's assoc with dizziness.  . 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 bilateral LE paralysis, resolved;  b. neg neuro w/u for sz in 2010  . Anorexia   . Hyperlipidemia   . Hx of migraines   . Chronic back pain   . Chest pain     a. 12/2009 MV ([facility]): Neg;  b. 03/2013 MV: small, mild reversible defect in the distal PDA, EF 60%.  . H/O sleep apnea     a. 2010: sleep study neg for osa/cataplexy/narcolepsy  . Melena     a. nl EGD  . History of tobacco abuse     a. 30 pack year hx, quit 2013.  Marland Kitchen GERD (gastroesophageal reflux disease)   . Hypertension     Social History   Social History  . Marital Status: Single    Spouse Name: N/A  . Number of Children: N/A  . Years of Education: N/A    Occupational History  . Not on file.   Social History Main Topics  . Smoking status: Current Every Day Smoker -- 0.50 packs/day for 30 years    Types: Cigarettes    Start date: 05/06/2013  . Smokeless tobacco: Never Used     Comment: 08-16-14- 10 cigarettes daily  . Alcohol Use: No  . Drug Use: No  . Sexual Activity: No   Other Topics Concern  . Not on file   Social History Narrative   Lives in [place] with husband who smokes and has lung cancer.    Current Outpatient Prescriptions on File Prior to Visit  Medication Sig  . aspirin EC 81 MG tablet Take 1 tablet (81 mg total) by mouth daily.  Marland Kitchen atorvastatin (LIPITOR) 20 MG tablet Take 1 tablet (20 mg total) by mouth daily.  . carisoprodol (SOMA) 350 MG tablet Take 1 tablet (350 mg total) by mouth 4 (four) times daily as needed for muscle spasms.  . Dexlansoprazole 30 MG capsule Take 1 capsule (30 mg total) by mouth daily.  Marland Kitchen dicyclomine (BENTYL) 20 MG tablet Take 1 tablet (20 mg total) by mouth 4 (four) times daily -  before meals and at bedtime.  . mirtazapine (REMERON) 15 MG tablet Take 1 tablet (15 mg  total) by mouth at bedtime.  . orphenadrine (NORFLEX) 100 MG tablet Take 1 tablet (100 mg total) by mouth 3 (three) times daily as needed for muscle spasms.  . promethazine (PHENERGAN) 25 MG tablet Take 1 tablet (25 mg total) by mouth every 8 (eight) hours as needed for nausea.  . traZODone (DESYREL) 100 MG tablet Take 1 tablet (100 mg total) by mouth at bedtime.  Marland Kitchen zolpidem (AMBIEN) 5 MG tablet Take 1 tablet (5 mg total) by mouth at bedtime as needed for sleep.   No current facility-administered medications on file prior to visit.    Review of Systems  Constitutional: Negative for fever, chills, diaphoresis, activity change, appetite change and fatigue.  HENT: Positive for congestion (some congestion), facial swelling and sinus pressure (bilateral frontal and maxillary). Negative for ear pain, hearing loss, postnasal drip,  rhinorrhea, sore throat and trouble swallowing.   Eyes: Negative for pain, redness and visual disturbance.  Respiratory: Negative for cough, chest tightness, shortness of breath and wheezing.   Cardiovascular: Negative for chest pain, palpitations and leg swelling.  Gastrointestinal: Negative for nausea, vomiting, abdominal pain, diarrhea and constipation.  Genitourinary: Negative for dysuria, frequency and hematuria.  Musculoskeletal: Negative for arthralgias and neck pain.  Skin: Negative for rash.  Neurological: Positive for headaches. Negative for dizziness, weakness, light-headedness and numbness.  Hematological: Negative for adenopathy.  Psychiatric/Behavioral: Negative for behavioral problems and confusion.   Per HPI unless specifically indicated above     Objective:    BP 112/69 mmHg  Pulse 100  Temp(Src) 98 F (36.7 C) (Oral)  Wt 98 lb 14.4 oz (44.861 kg)  Wt Readings from Last 3 Encounters:  01/23/15 98 lb 14.4 oz (44.861 kg)  12/06/14 108 lb 8 oz (49.215 kg)  10/28/14 103 lb (46.72 kg)    Physical Exam  Constitutional: She is oriented to person, place, and time. She appears well-developed. No distress.  Thin, well appearing  HENT:  Head: Normocephalic and atraumatic.  Right Ear: External ear normal.  Left Ear: External ear normal.  Nose: Nose normal.  Mouth/Throat: No oropharyngeal exudate.  Frontal and maxillary sinuses b/l +TTP, without deformity or facial swelling. Bilateral TM's with mild bulging but without erythema. Patent nares without congestion. O/p clear.  Eyes: Conjunctivae and EOM are normal. Pupils are equal, round, and reactive to light.  Neck: Normal range of motion. Neck supple. No thyromegaly present.  Cardiovascular: Regular rhythm, normal heart sounds and intact distal pulses.   No murmur heard. Pulmonary/Chest: Effort normal. No respiratory distress. She has no wheezes. She has rales (scattered bibasilar, clears with deep cough).  Good air  movement. Speaks full sentences.  Musculoskeletal: Normal range of motion. She exhibits tenderness (bilateral lower back mild +TTP, with some lumbar paraspinal hypertonicity). She exhibits no edema.  Lymphadenopathy:    She has no cervical adenopathy.  Neurological: She is alert and oriented to person, place, and time.  Skin: Skin is warm and dry. No rash noted. She is not diaphoretic.  Nursing note and vitals reviewed.  Results for orders placed or performed in visit on 52/84/13  Basic Metabolic Panel  Result Value Ref Range   Sodium 141 135 - 145 mEq/L   Potassium 4.3 3.5 - 5.3 mEq/L   Chloride 108 96 - 112 mEq/L   CO2 29 19 - 32 mEq/L   Glucose, Bld 83 70 - 99 mg/dL   BUN 7 6 - 23 mg/dL   Creat 0.88 0.50 - 1.10 mg/dL   Calcium 9.2  8.4 - 10.5 mg/dL  CBC with Differential  Result Value Ref Range   WBC 7.0 4.0 - 10.5 K/uL   RBC 4.18 3.87 - 5.11 MIL/uL   Hemoglobin 12.6 12.0 - 15.0 g/dL   HCT 39.0 36.0 - 46.0 %   MCV 93.3 78.0 - 100.0 fL   MCH 30.1 26.0 - 34.0 pg   MCHC 32.3 30.0 - 36.0 g/dL   RDW 13.8 11.5 - 15.5 %   Platelets 225 150 - 400 K/uL   MPV 12.5 (H) 8.6 - 12.4 fL   Neutrophils Relative % 65 43 - 77 %   Neutro Abs 4.6 1.7 - 7.7 K/uL   Lymphocytes Relative 26 12 - 46 %   Lymphs Abs 1.8 0.7 - 4.0 K/uL   Monocytes Relative 6 3 - 12 %   Monocytes Absolute 0.4 0.1 - 1.0 K/uL   Eosinophils Relative 2 0 - 5 %   Eosinophils Absolute 0.1 0.0 - 0.7 K/uL   Basophils Relative 1 0 - 1 %   Basophils Absolute 0.1 0.0 - 0.1 K/uL   Smear Review Criteria for review not met   H. pylori Screen  Result Value Ref Range   H Pylori Screen, POC NEG       Assessment & Plan:   Problem List Items Addressed This Visit      Respiratory   Acute sinusitis - Primary    Consistent with subacute rhinosinusitis, gradual worsening without prior improvement, seems more concerning for sinusitis may have progressed to bacterial now, not entirely consistent with prior viral URI or allergy.  Currently well appearing, afebrile, +tender sinuses b/l w/o purulence. Similar to prior sinusitis.  Plan: 1. Start Augmentin 875-133m PO BID x 10 days 2. Supportive care with nasal saline OTC, hydration, and OTC meds as discusse 3. Return criteria reviewed       Relevant Medications   amoxicillin-clavulanate (AUGMENTIN) 875-125 MG per tablet     Other   BACK PAIN, LOW    Stable, chronic LBP, s/p surgical history.  Plan: 1. Refilled chronic pain medicine with Hydrocodone 10/325 take 1 q 6 hr PRN (#180, 0 refills) for 45 day supply, patient is due for refill, unable to get in with PCP. Advised to follow-up within 1 month with PCP.      Relevant Medications   HYDROcodone-acetaminophen (LORCET HD) 10-325 MG per tablet   Encounter for chronic pain management   Relevant Medications   HYDROcodone-acetaminophen (LORCET HD) 10-325 MG per tablet    Other Visit Diagnoses    Chronic pain syndrome           Meds ordered this encounter  Medications  . amoxicillin-clavulanate (AUGMENTIN) 875-125 MG per tablet    Sig: Take 1 tablet by mouth 2 (two) times daily.    Dispense:  20 tablet    Refill:  0  . HYDROcodone-acetaminophen (LORCET HD) 10-325 MG per tablet    Sig: Take 1 tablet by mouth every 6 (six) hours as needed.    Dispense:  180 tablet    Refill:  0      Follow up plan: Return in about 10 days (around 02/02/2015), or if symptoms worsen or fail to improve, for sinusitis.  ANobie Putnam DRiverdale PGY-3

## 2015-01-23 NOTE — Patient Instructions (Signed)
Dear Paula Duncan, Thank you for coming in to clinic today.  1. It sounds like you have persistent Sinus Congestion - I do not think that this is a Bacterial Sinus Infection 2. Maybe related to some seasonal allergies and change in weather. 3. Keep taking Flonase nasal spray 1 spray in each nostril each day for next 1 month 4. Start Claritin or Zyrtec (once daily) over the counter for about 1 month trial - if helping can continue for the summer 5. Start Nasal Saline (ocean spray or simply saline) flush congestion 2-4 times daily  1. It sounds like you likely have another episode of Sinusitis - Start Augmentin 1 pill twice daily (breakfast and dinner, with food and plenty of water) for 10 days, complete entire course, do not stop early even if feeling better - Drink plenty of fluids - You may try over the counter Nasal Saline spray (Simply Saline, Ocean Spray) as needed to reduce congestion. - Tylenol / Motrin is fine for few days to 1 week as needed - If develop worsening cough, may try Mucinex, can do OTC anti-histamines such as Zyrtec / Claritin as needed  If you develop headaches, fevers/chills, productive cough, sinus pain or pressure - please call or return to clinic for re-evaluation  Heartbeat is slower, about 100 on my re-check. Keep eating your regular diet and ensures. Recommend follow-up once feeling better with your primary doctor.  Please schedule a follow-up appointment with Dr. Bonner Puna in 10 to 14 days if not improved or worsening.  If you have any other questions or concerns, please feel free to call the clinic to contact me. You may also schedule an earlier appointment if necessary.  However, if your symptoms get significantly worse, please go to the Emergency Department to seek immediate medical attention.  Nobie Putnam, [place]

## 2015-01-23 NOTE — Assessment & Plan Note (Signed)
Stable, chronic LBP, s/p surgical history.  Plan: 1. Refilled chronic pain medicine with Hydrocodone 10/325 take 1 q 6 hr PRN (#180, 0 refills) for 45 day supply, patient is due for refill, unable to get in with PCP. Advised to follow-up within 1 month with PCP.

## 2015-02-21 ENCOUNTER — Other Ambulatory Visit: Payer: Self-pay | Admitting: *Deleted

## 2015-02-22 MED ORDER — ATORVASTATIN CALCIUM 20 MG PO TABS: 20.0000 mg | ORAL_TABLET | Freq: Every day | ORAL | Status: DC

## 2015-02-22 MED ORDER — BUPROPION HCL ER (XL) 300 MG PO TB24: ORAL_TABLET | ORAL | Status: DC

## 2015-02-22 NOTE — Telephone Encounter (Signed)
2nd request.  Martin, Tamika L, RN  

## 2015-02-27 ENCOUNTER — Ambulatory Visit (INDEPENDENT_AMBULATORY_CARE_PROVIDER_SITE_OTHER): Payer: Medicare Other | Admitting: Family Medicine

## 2015-02-27 ENCOUNTER — Encounter: Payer: Self-pay | Admitting: Family Medicine

## 2015-02-27 VITALS — BP 112/60 | HR 94 | Temp 98.2°F | Ht 64.0 in | Wt 103.2 lb

## 2015-02-27 DIAGNOSIS — M5440 Lumbago with sciatica, unspecified side: Secondary | ICD-10-CM

## 2015-02-27 DIAGNOSIS — Z7189 Other specified counseling: Secondary | ICD-10-CM

## 2015-02-27 DIAGNOSIS — G8929 Other chronic pain: Secondary | ICD-10-CM

## 2015-02-27 MED ORDER — HYDROCODONE-ACETAMINOPHEN 10-325 MG PO TABS: 1.0000 | ORAL_TABLET | Freq: Four times a day (QID) | ORAL | Status: DC | PRN
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
## 2015-02-27 NOTE — Patient Instructions (Signed)
Pt left prior to getting AVS to accompany partner being admitted to the hospital.

## 2015-02-27 NOTE — Assessment & Plan Note (Signed)
See updated overview. UDS/agreement signing due again in Feb 2017.

## 2015-03-24 ENCOUNTER — Other Ambulatory Visit: Payer: Self-pay | Admitting: Family Medicine

## 2015-03-24 NOTE — Telephone Encounter (Signed)
Not our pt, see med ref req

## 2015-04-11 ENCOUNTER — Other Ambulatory Visit: Payer: Self-pay | Admitting: Family Medicine

## 2015-04-11 DIAGNOSIS — G8929 Other chronic pain: Secondary | ICD-10-CM

## 2015-04-11 DIAGNOSIS — M5440 Lumbago with sciatica, unspecified side: Secondary | ICD-10-CM

## 2015-04-11 NOTE — Telephone Encounter (Signed)
Pt called and needs a refill on her hydrocodone left up front for pick up. Please call patient when to pick up. jw

## 2015-04-12 NOTE — Telephone Encounter (Signed)
Pt calling to check on status of this request. Also needs refill on carisoprodol (SOMA) 350 MG tablet to [place].

## 2015-04-12 NOTE — Telephone Encounter (Signed)
Pt called about her soma. Says she is completely out of these and needs them for her legs.  "can a head dr call them in?"

## 2015-04-13 NOTE — Telephone Encounter (Signed)
Pt is calling for the 5th time. She would like to know when the doctor is going to write her prescription. She is needing refills on her Hydrocodone and Soma. Please inform patient since she requested the refills on 04/11/15. jw

## 2015-04-13 NOTE — Telephone Encounter (Signed)
Patient is calling once more to check on the status of this request. Patient states that she has been out of her Somas since the 04/03/2015. She states "I don't understand why this takes so long, because it only takes a few min to do." I informed patient of our goal and policy to complete requests such as these within 48 business hours. I apologized that she has been out for so long, but we are working to have this request completed as soon as possible. Thank you, Fonda Kinder, ASA

## 2015-04-14 MED ORDER — CARISOPRODOL 350 MG PO TABS: 350.0000 mg | ORAL_TABLET | Freq: Four times a day (QID) | ORAL | Status: DC | PRN

## 2015-04-14 MED ORDER — HYDROCODONE-ACETAMINOPHEN 10-325 MG PO TABS: 1.0000 | ORAL_TABLET | Freq: Four times a day (QID) | ORAL | Status: DC | PRN

## 2015-04-14 NOTE — Telephone Encounter (Addendum)
Patient came into office this morning to check on her Soma refill.  States she has been calling repeatedly and pharmacy has also faxed over refill requests for med.  Has not heard anything and needs refill since she has been out since 04/03/15. Also requesting to change doctors since Dr. Bonner Puna "doesn't listen to me" and "his not responding to my repeated calls to refill my medication means he's not looking out for me".  States she has been taking more hydrocodone since she has been out of [place] and it will not last the intended 45 days.  Should be due for hydrocodone this week.  Dr. Bonner Puna not in clinic and discussed with Dr. Erin Hearing (preceptor)--page Dr. Bonner Puna and he will write Rx if authorized by Dr. Bonner Puna.  Ok per DR. Grunz to give Soma refill.  Informed Dr. Erin Hearing and he will discuss with PCP since patient is also requesting refill of hydrocodone and has not had followup office visit for medications.  Discussed PCP change request policy with patient.  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  Patient verbalized understanding.  Will call patient back with status of PCP change request.    Burna Forts, BSN, RN-BC

## 2015-04-14 NOTE — Telephone Encounter (Signed)
She may have refills to get her to the next appointment which should be scheduled within the next month with her new PCP.   She should have soma 350mg  1 tab po QID prn severe muscle spasms #120, refills: 0. I had prescribed norflex instead of soma but this hasn't been helpful for her.She should be cautioned not to take both muscle relaxant medications.   She should also have a refill on lorcet 10/325mg  1 tab po q6h prn pain #180, refills: 0.   Thank you.

## 2015-04-14 NOTE — Telephone Encounter (Signed)
Prescriptions printed on non  Rx paper Represcribed  and delivered to [facility]

## 2015-04-14 NOTE — Telephone Encounter (Signed)
Pharmacy called to verify patients prescription for Hydrocodone. Pharmacy states medication refill is two weeks early and the pharmacy will not refill the medication. Pharmacist states patient should have 60 tablets left. Please clarify if prescription may be refilled or not.

## 2015-04-20 ENCOUNTER — Telehealth: Payer: Self-pay | Admitting: *Deleted

## 2015-04-20 NOTE — Telephone Encounter (Signed)
LMOVM for pt to return call.  Spoke with pharmacy.  They are unable to fill the Rx for Lorcet.  Her last Rx was given on 11/12 for a 45 day supply (one tablet every 6 hours and she was given 180 tablets) this means it would not be due until 12/23 at the [facility] (this is still 3 days early).  Even if we were to ok the refill her insurance would not pay for it early, and per Megan at the pharmacy pt constantly gets her refills 3 days early and they would not be comfortable refilling until 12/23. Alma Muegge, Salome Spotted

## 2015-04-21 NOTE — Telephone Encounter (Addendum)
Patient informed that unable to authorize early refill of Lorcet per pharmacy.  Patient verbalized understanding that she will have to wait to pick up Rx on 04/28/15.  Patient still requesting to change her PCP.    Burna Forts, BSN, RN-BC  PCP changed in Epic to Dr. Cyndia Skeeters.  Has appt with Dr. Cyndia Skeeters for 04/24/15 to establish with new PCP.  Burna Forts, BSN, RN-BC

## 2015-04-21 NOTE — Telephone Encounter (Signed)
Noted switch to Dr. Cyndia Skeeters for PCP. Will be available to him for any questions he may have for safe transition of care.

## 2015-04-24 ENCOUNTER — Encounter: Payer: Self-pay | Admitting: Student

## 2015-04-24 ENCOUNTER — Ambulatory Visit (INDEPENDENT_AMBULATORY_CARE_PROVIDER_SITE_OTHER): Payer: Medicare Other | Admitting: Student

## 2015-04-24 VITALS — BP 136/64 | HR 108 | Temp 97.4°F | Ht 64.0 in | Wt 108.7 lb

## 2015-04-24 DIAGNOSIS — J0111 Acute recurrent frontal sinusitis: Secondary | ICD-10-CM | POA: Diagnosis not present

## 2015-04-24 DIAGNOSIS — G4700 Insomnia, unspecified: Secondary | ICD-10-CM | POA: Diagnosis not present

## 2015-04-24 DIAGNOSIS — R634 Abnormal weight loss: Secondary | ICD-10-CM | POA: Diagnosis not present

## 2015-04-24 DIAGNOSIS — R1084 Generalized abdominal pain: Secondary | ICD-10-CM

## 2015-04-24 MED ORDER — PROMETHAZINE HCL 25 MG PO TABS: 25.0000 mg | ORAL_TABLET | Freq: Three times a day (TID) | ORAL | Status: DC | PRN

## 2015-04-24 MED ORDER — ZOLPIDEM TARTRATE 5 MG PO TABS: 5.0000 mg | ORAL_TABLET | Freq: Every evening | ORAL | Status: DC | PRN

## 2015-04-24 MED ORDER — TRAZODONE HCL 100 MG PO TABS: 100.0000 mg | ORAL_TABLET | Freq: Every day | ORAL | Status: DC

## 2015-04-24 NOTE — Patient Instructions (Signed)
It was great seeing you today! We have addressed the following issues today  1. Sinusitis: you can call me in three days if your symptoms won't improve 2. Sleep issue: I would like you to minimize the medications you are taking. You are on multiple sleep medications that are not safe. I would like you to come back and see me soon.     If we did any lab work today, and the results require attention, either me or my nurse will get in touch with you. If everything is normal, you will get a letter in mail. If you don't hear from [place] in two weeks, please give [place] a call. Otherwise, I look forward to talking with you again at our next visit. If you have any questions or concerns before then, please call the clinic at [phone].  Please bring all your medications to every doctors visit   Sign up for My Chart to have easy access to your labs results, and communication with your Primary care physician.    Please check-out at the front desk before leaving the clinic.   Take Care,
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
## 2015-04-24 NOTE — Assessment & Plan Note (Signed)
Advised patient to use saline spray and  call me if her symptoms don't improve within the next four days. I will send a prescription for Augmentin at that time.
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
## 2015-04-25 ENCOUNTER — Telehealth: Payer: Self-pay | Admitting: Student

## 2015-04-25 ENCOUNTER — Encounter: Payer: Self-pay | Admitting: Student

## 2015-04-25 DIAGNOSIS — J329 Chronic sinusitis, unspecified: Secondary | ICD-10-CM

## 2015-04-25 MED ORDER — AMOXICILLIN-POT CLAVULANATE 875-125 MG PO TABS: 1.0000 | ORAL_TABLET | Freq: Two times a day (BID) | ORAL | Status: DC

## 2015-04-25 NOTE — Telephone Encounter (Signed)
Is not feeling any better. Would like an antibotic.  [place]

## 2015-04-25 NOTE — Telephone Encounter (Signed)
Entered in error. Zimmerman Rumple, Romeka Scifres D, CMA  

## 2015-05-16 ENCOUNTER — Telehealth: Payer: Self-pay | Admitting: Student

## 2015-05-16 DIAGNOSIS — R05 Cough: Secondary | ICD-10-CM

## 2015-05-16 DIAGNOSIS — R059 Cough, unspecified: Secondary | ICD-10-CM

## 2015-05-16 DIAGNOSIS — M62838 Other muscle spasm: Secondary | ICD-10-CM

## 2015-05-16 MED ORDER — BENZONATATE 200 MG PO CAPS: 200.0000 mg | ORAL_CAPSULE | Freq: Two times a day (BID) | ORAL | Status: DC | PRN

## 2015-05-16 NOTE — Telephone Encounter (Signed)
Needs refill on cough medicine and tessalon pills.  Paula Duncan

## 2015-05-17 NOTE — Telephone Encounter (Signed)
Patient still needs refill of "Cherry Tussin" Cough syrup

## 2015-05-18 ENCOUNTER — Other Ambulatory Visit: Payer: Self-pay | Admitting: *Deleted

## 2015-05-18 NOTE — Telephone Encounter (Signed)
Pt wants to know about the cough syrup refill. She got the tessalon pills yesterday

## 2015-05-18 NOTE — Telephone Encounter (Signed)
Tried to call patient but her phone is not in service. I recommend office visit for both medications, Cheratussin and Soma. Both medications are controlled substances. Thanks! Bretta Bang

## 2015-05-18 NOTE — Telephone Encounter (Signed)
Pt called again. Will be receiving fax from pharmacy about refilliing her [place].  Just wanted to let dr know she has an appt jan 27
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
## 2015-05-19 NOTE — Telephone Encounter (Signed)
2nd request.  Martin, Tamika L, RN  

## 2015-05-19 NOTE — Telephone Encounter (Signed)
Pt calling once more. Pt asked to speak with [facility]. Although Tomasa Hosteller is assisting in clinic today, I contacted her. I was informed that because the provider is not here today, this will situation would have to wait until Monday. Pt was very unhappy and rude and demanded to speak with someone over [place]. I informed patient that this request would not be possible at this time. Pt is aware of 48 business hour turn around time as per policy when awaiting an answer from the physician. Pt exclaimed that she would have to go through weekend without medication and hung up the phone on me. Sadie Reynolds, ASA

## 2015-05-19 NOTE — Telephone Encounter (Signed)
Will forward to MD to make him aware. Jazmin Hartsell,CMA  

## 2015-05-22 ENCOUNTER — Ambulatory Visit (INDEPENDENT_AMBULATORY_CARE_PROVIDER_SITE_OTHER): Payer: Medicare Other | Admitting: Family Medicine

## 2015-05-22 ENCOUNTER — Encounter: Payer: Self-pay | Admitting: Family Medicine

## 2015-05-22 VITALS — BP 128/70 | HR 99 | Temp 98.3°F | Ht 64.0 in | Wt 103.5 lb

## 2015-05-22 DIAGNOSIS — J441 Chronic obstructive pulmonary disease with (acute) exacerbation: Secondary | ICD-10-CM

## 2015-05-22 MED ORDER — CARISOPRODOL 350 MG PO TABS: 350.0000 mg | ORAL_TABLET | Freq: Four times a day (QID) | ORAL | Status: DC | PRN

## 2015-05-22 MED ORDER — LEVALBUTEROL TARTRATE 45 MCG/ACT IN AERO: 2.0000 | INHALATION_SPRAY | Freq: Four times a day (QID) | RESPIRATORY_TRACT | Status: DC | PRN

## 2015-05-22 MED ORDER — GUAIFENESIN-CODEINE 100-10 MG/5ML PO SYRP: 5.0000 mL | ORAL_SOLUTION | Freq: Three times a day (TID) | ORAL | Status: DC | PRN

## 2015-05-22 MED ORDER — PREDNISONE 20 MG PO TABS: 40.0000 mg | ORAL_TABLET | Freq: Every day | ORAL | Status: AC

## 2015-05-22 MED ORDER — AZITHROMYCIN 250 MG PO TABS: ORAL_TABLET | ORAL | Status: AC

## 2015-05-22 NOTE — Addendum Note (Signed)
Addended by: Wendee Beavers T on: 05/22/2015 05:24 PM   Modules accepted: Orders, Medications

## 2015-05-22 NOTE — Progress Notes (Signed)
    Subjective   Paula Duncan is a 55 y.o. female that presents for a same day visit  1. Cough: Patient reports cough symptoms worsened in the last two weeks. She reports a productive cough with increased sputum. She has not used an inhaler because she has had increased palpitations in the past. She reports no fevers or chest pain. She has some dyspnea when she has increased coughing. She has not tried any other medications. She is still smoking.  ROS Per HPI  Social History  Substance Use Topics  . Smoking status: Current Every Day Smoker -- 0.50 packs/day for 30 years    Types: Cigarettes    Start date: 05/06/2013  . Smokeless tobacco: Never Used     Comment: 08-16-14- 10 cigarettes daily  . Alcohol Use: No    Allergies  Allergen Reactions  . Nsaids Other (See Comments)    REACTION: Gets "Black and Blue" - easy bruising  . Tramadol Hcl Other (See Comments)    "Black and blue"  . Chantix [Varenicline]     Nausea, Dysgeusia  . Metronidazole Other (See Comments)    "caused dots to appear on legs"    Objective   BP 128/70 mmHg  Pulse 99  Temp(Src) 98.3 F (36.8 C) (Oral)  Ht 5\' 4"  (1.626 m)  Wt 103 lb 8 oz (46.947 kg)  BMI 17.76 kg/m2  SpO2 99%  General: Well appearing, no distress Respiratory/Chest: Wheezing bilaterally with mildly diminished breath sounds  Assessment and Plan   1. COPD exacerbation ([place]) - will treat cough with cough syrup and COPD exacerbation with antibiotics, prednisone for 5 days - prescribing xopenex since patient reports history of significant palpitations with albuterol - guaiFENesin-codeine (ROBITUSSIN AC) 100-10 MG/5ML syrup; Take 5 mLs by mouth 3 (three) times daily as needed for cough.  Dispense: 120 mL; Refill: 0 - predniSONE (DELTASONE) 20 MG tablet; Take 2 tablets (40 mg total) by mouth daily with breakfast.  Dispense: 10 tablet; Refill: 0 - levalbuterol (XOPENEX HFA) 45 MCG/ACT inhaler; Inhale 2 puffs into the lungs every 6 (six)  hours as needed for wheezing or shortness of breath.  Dispense: 1 Inhaler; Refill: 12 - azithromycin (ZITHROMAX) 250 MG tablet; Take 500mg  (2 pills) today, then 250mg  (1 pill) starting 1/17 for 4 days  Dispense: 6 tablet; Refill: 0

## 2015-05-22 NOTE — Telephone Encounter (Signed)
Patient calling inquiring about this refill.

## 2015-05-22 NOTE — Telephone Encounter (Addendum)
Phoned in Rx for Carisoprodol 350 mg #120 with no additional refills. Called patient and notified her about this. Warned her about sedation and respiratory depression from the medications she is on. She is grateful at the end.

## 2015-05-22 NOTE — Patient Instructions (Signed)
Thank you for coming to see me today. It was a pleasure. Today we talked about:   COPD Exacerbation: I will prescribe an antibiotic and prednisone for 5 days. Also, please try to pick up the Xopenex and use this every 4-6 hours as needed for wheezing and shortness of breath.  Please make an appointment to see Dr. Cyndia Skeeters in 4 weeks or sooner for follow-up of your COPD.  If you have any questions or concerns, please do not hesitate to call the office at [phone].  Sincerely,  Cordelia Poche, MD

## 2015-05-22 NOTE — Telephone Encounter (Signed)
Pt came in today for her cough and saw dr Lonny Prude. He is unable to refill her soma.  She is waiting for dr Cyndia Skeeters to refill her RX. She had an appt Jan 27 with dr Cyndia Skeeters. That was the earliest available. She had trouble refilling this Rx last month and dr Erin Hearing refilled the RX. She has been out of the medication Jan 9. She is upset that this hasnt been refilled

## 2015-05-29 ENCOUNTER — Other Ambulatory Visit: Payer: Self-pay | Admitting: Student

## 2015-06-02 ENCOUNTER — Encounter: Payer: Self-pay | Admitting: Student

## 2015-06-02 ENCOUNTER — Telehealth: Payer: Self-pay | Admitting: *Deleted

## 2015-06-02 ENCOUNTER — Ambulatory Visit (INDEPENDENT_AMBULATORY_CARE_PROVIDER_SITE_OTHER): Payer: Medicare Other | Admitting: Student

## 2015-06-02 VITALS — BP 106/60 | HR 113 | Temp 98.1°F | Ht 64.0 in | Wt 106.0 lb

## 2015-06-02 DIAGNOSIS — Z7189 Other specified counseling: Secondary | ICD-10-CM | POA: Diagnosis not present

## 2015-06-02 DIAGNOSIS — G8929 Other chronic pain: Secondary | ICD-10-CM | POA: Diagnosis not present

## 2015-06-02 DIAGNOSIS — M5440 Lumbago with sciatica, unspecified side: Secondary | ICD-10-CM | POA: Diagnosis not present

## 2015-06-02 DIAGNOSIS — Z87891 Personal history of nicotine dependence: Secondary | ICD-10-CM | POA: Diagnosis not present

## 2015-06-02 DIAGNOSIS — G4700 Insomnia, unspecified: Secondary | ICD-10-CM

## 2015-06-02 DIAGNOSIS — J449 Chronic obstructive pulmonary disease, unspecified: Secondary | ICD-10-CM

## 2015-06-02 MED ORDER — HYDROCODONE-ACETAMINOPHEN 10-325 MG PO TABS: 1.0000 | ORAL_TABLET | Freq: Four times a day (QID) | ORAL | Status: DC | PRN

## 2015-06-02 MED ORDER — RAMELTEON 8 MG PO TABS: 8.0000 mg | ORAL_TABLET | Freq: Every day | ORAL | Status: DC

## 2015-06-02 NOTE — Patient Instructions (Addendum)
It was great seeing you today! We have addressed the following issues today  1. Cough and shortness of breath: I would like you to take the inhaler you were prescribed last time you were here. Take it only when you are short of breath or you have cough 2. Sleep issue: I would like you to try a medication called Melatonin. I also like to try sleep hygiene along with that. I recommoned cutting down on your soda.  3. Chronic back pain: refilled your prescription for Norco.   If we did any lab work today, and the results require attention, either me or my nurse will get in touch with you. If everything is normal, you will get a letter in mail. If you don't hear from [place] in two weeks, please give [place] a call. Otherwise, I look forward to talking with you again at our next visit. If you have any questions or concerns before then, please call the clinic at [phone].  Please bring all your medications to every doctors visit   Sign up for My Chart to have easy access to your labs results, and communication with your Primary care physician.    Please check-out at the front desk before leaving the clinic.   Take Care,

## 2015-06-02 NOTE — Progress Notes (Signed)
   Subjective:    Patient ID: Paula Duncan, female    DOB: April 25, 1961, 55 y.o.   MRN: [id number]  HPI Cough/SOB: clear sputum. No blood. Shortness of breathwith walking fast. Can walk about 50 yards without feeling short of breath. This has been going on for about a month. Before that she was able go to [employer] do her shopping. She was here about two weeks ago and was treated for COPD exacerbation with prednisone, Z-pak, Cheratussin and Xopnex. She hasn't taken her inhaler out of concern for palptitation. She says the medications she took didn't help much. No fever, chills or night sweat. Gained two pounds.  She stopped taking her Wellbutrin and back on smoking cigarette.  Insomnia: trouble falling asleep or staying asleep. Goes to bed at 9:30pm. Takes her about 45 minutes to fall asleep but wakes up at night. Drinks 10 cans of coke a day. No illicit drugs. No coffee. No tea. Have TV in bed room.   Back pain: Chronic lumbar pain intractable s/p laminectomy and fusion, injections. She is on Lortab 10-325mg . Last filled on 04/24/2015. She was given 180 tablets at that time.   Social history: currently smokes. 30-pack-year history.   Review of Systems No constitutional symptoms    Objective:   Physical Exam Filed Vitals:   06/02/15 1359  BP: 106/60  Pulse: 113  Temp: 98.1 F (36.7 C)  TempSrc: Oral  Height: 5\' 4"  (1.626 m)  Weight: 106 lb (48.081 kg)  SpO2: 97%  Gen: appears-well Nares: with some scabbed blood in the left , right normal Oropharynx: clear, moist Lungs: now work of breathing, end expiratory wheeze bilaterally, no crackles  Heart: regular rate and rythm    Assessment & Plan:  COPD (chronic obstructive pulmonary disease) Continues to complain about shortness of breath and cough with clear sputum. No work of breathing but lung exam with bilateral wheeze, no crackles. Wasn't using her "albuterol" out of concern for palpitation.  Explained to her that she was prescribed  Xopnex not albuterol. Reassured her that Xopnex won't cause her as much palpitation as albuterol. Cough may take up to 6 weeks to resolve. Will step up therapy if no improvement with this. Encouraged her to quit smoking. She is planning to work on this when her husband is discharged from hospital after surgery for diabetic foot as she don't want to smoke in front of him. We may consider PFT/Spirometry at one point.  INSOMNIA, CHRONIC Patient on Ambien in the past. Gave prescription for ramelteon instead as she is on multiple sedating medications. Gave handout for sleep hygiene. Advised to cut down or stop drinking soda.   BACK PAIN, LOW Chronic lumbar pain intractable s/p laminectomy and fusion, injections.   Gave Lortab 10-325mg  #180 for one month. Will have pain contract signed and UDS checked at next pain management encounter. Will also consider changing it to [place].  History of tobacco use  30-pack-year history. Stopped taking Wellbutrin as she continues to smoke. Encouraged her to quit. She appears to be determined to quit smoking when her husband is home after surgery for Diabetic foot ulcer.  Will assess this at next encounter.

## 2015-06-02 NOTE — Telephone Encounter (Signed)
Prior Authorization received from [place] for rozerem 8 mg.  PA form placed in provider box for completion. Derl Barrow, RN

## 2015-06-02 NOTE — Assessment & Plan Note (Addendum)
30-pack-year history. Stopped taking Wellbutrin as she continues to smoke. Encouraged her to quit. She appears to be determined to quit smoking when her husband is home after surgery for Diabetic foot ulcer.  Will assess this at next encounter.

## 2015-06-02 NOTE — Assessment & Plan Note (Signed)
Chronic lumbar pain intractable s/p laminectomy and fusion, injections.   Gave Lortab 10-325mg  #180 for one month. Will have pain contract signed and UDS checked at next pain management encounter. Will also consider changing it to [place].

## 2015-06-02 NOTE — Assessment & Plan Note (Signed)
Continues to complain about shortness of breath and cough with clear sputum. No work of breathing but lung exam with bilateral wheeze, no crackles. Wasn't using her "albuterol" out of concern for palpitation.  Explained to her that she was prescribed Xopnex not albuterol. Reassured her that Xopnex won't cause her as much palpitation as albuterol. Cough may take up to 6 weeks to resolve. Will step up therapy if no improvement with this. Encouraged her to quit smoking. She is planning to work on this when her husband is discharged from hospital after surgery for diabetic foot as she don't want to smoke in front of him. We may consider PFT/Spirometry at one point.

## 2015-06-02 NOTE — Assessment & Plan Note (Signed)
Patient on Ambien in the past. Gave prescription for ramelteon instead as she is on multiple sedating medications. Gave handout for sleep hygiene. Advised to cut down or stop drinking soda.

## 2015-06-03 NOTE — Telephone Encounter (Signed)
Patient can get Melatonin 3 mg OTC. She knows about this. Thanks!

## 2015-06-08 ENCOUNTER — Telehealth: Payer: Self-pay | Admitting: Student

## 2015-06-08 NOTE — Telephone Encounter (Signed)
Pt called and needs a refill on her Ambien called or faxed in to her pharmacy. jw

## 2015-06-13 ENCOUNTER — Telehealth: Payer: Self-pay | Admitting: Family Medicine

## 2015-06-13 NOTE — Telephone Encounter (Signed)
[phone] (M) [phone] (H)  I explained the reasoning behind primary care physicians weaning patients off chronic opiods and sleep medicines. I indicated we could attempt to refer her to a Pain Specialist and /or a Psychiatrist who would be handling these medicines. She definitely does not want to consider psychiatry. She did not have a comment about a referral to a Pain Specialist--given her lack of insurance it might be difficult to get her seen, but I assured her we could try.  She voiced understanding and had no further questions.

## 2015-06-14 ENCOUNTER — Telehealth: Payer: Self-pay | Admitting: Student

## 2015-06-14 NOTE — Telephone Encounter (Signed)
Pt asking to speak to Dr. Nori Riis by name. When I asked for the nature of her call she states that we will no longer prescribe to her Hydrocodone, Soma and Ambien. She would like to know what to do about her medication until she finds another doctor. Sadie Reynolds, ASA

## 2015-06-15 NOTE — Telephone Encounter (Signed)
Dear Dema Severin Team I am out of office so please call her and tell her that she should continue to see Dr. Cyndia Skeeters on a monthly basis as we taper her off her pain medicine.

## 2015-06-19 NOTE — Telephone Encounter (Signed)
Contacted pt to inform her of below and she stated that she has an appointment tomorrow and that she needs to have her medicines refilled and that we would understand after we see what it is doing to her without them, she says she is not sleeping and she needs her medications. She stated that she would like to have Dr. Nori Riis present in the appointment or someone else with authority.  I told her that i would send a message to let them know her wishes but that there was a possibility that there would not be someone with authority present at her appointment. Katharina Caper, Arias Weinert D, [place]

## 2015-06-20 ENCOUNTER — Encounter: Payer: Self-pay | Admitting: Student

## 2015-06-20 ENCOUNTER — Ambulatory Visit (INDEPENDENT_AMBULATORY_CARE_PROVIDER_SITE_OTHER): Payer: Medicare Other | Admitting: Student

## 2015-06-20 VITALS — BP 149/64 | HR 113 | Temp 97.8°F | Ht 64.0 in | Wt 109.0 lb

## 2015-06-20 DIAGNOSIS — G8929 Other chronic pain: Secondary | ICD-10-CM

## 2015-06-20 DIAGNOSIS — M5440 Lumbago with sciatica, unspecified side: Secondary | ICD-10-CM

## 2015-06-20 DIAGNOSIS — Z7189 Other specified counseling: Secondary | ICD-10-CM

## 2015-06-20 DIAGNOSIS — M62838 Other muscle spasm: Secondary | ICD-10-CM

## 2015-06-20 MED ORDER — CARISOPRODOL 350 MG PO TABS: 350.0000 mg | ORAL_TABLET | Freq: Four times a day (QID) | ORAL | Status: DC | PRN

## 2015-06-20 MED ORDER — HYDROCODONE-ACETAMINOPHEN 10-325 MG PO TABS: 1.0000 | ORAL_TABLET | Freq: Four times a day (QID) | ORAL | Status: DC | PRN

## 2015-06-20 NOTE — Assessment & Plan Note (Signed)
Gave paper prescriptions for Lorcet 10/325 mg #180 to be filled on 07/03/2015 or later, and Soma 350 mg #120 to be filled after 06/22/2015 until she finds new provider. Explained to her the adverse effects of these medications. Advised not to drive or operate medication while on these medications.

## 2015-06-20 NOTE — Progress Notes (Signed)
   Subjective:    Patient ID: Paula Duncan, female    DOB: Apr 04, 1961, 55 y.o.   MRN: [id number]  HPI Pain: patient is here to have her pain medications refilled until she finds a new provider. She says we are taking away her pain medications. However, she is still getting her pain medications (Lorecet and Soma). She says, "this practice is teaching you wrong medicine". I told her our goal is to make her safe and healthier. She says she don't understand why we have to stop the medications that have worked well for her in the past. I have repeatedly discussed about the risks associated with her pain and sleep medications.  Patient was on Ambien, Remeron, Trazodone, Lorcet, Soma & Norflex when I first took over her care. We were able to stop norflex two months ago. Last month, we stopped her Ambien so that she can try safer agents such as Melatonin besides remeron and trazodone. I have also discussed about sleep hygiene in detail.  However, she continues to report difficulty sleeping without her Ambien. She also drinks about 10 cans of coke daily ROS  Per HPI  Objective:   Physical Exam Filed Vitals:   06/20/15 0910  BP: 149/64  Pulse: 113  Temp: 97.8 F (36.6 C)  TempSrc: Oral  Height: 5\' 4"  (1.626 m)  Weight: 109 lb (49.442 kg)   Gen: appears upset    Assessment & Plan:  Encounter for chronic pain management Gave paper prescriptions for Lorcet 10/325 mg #180 to be filled on 07/03/2015 or later, and Soma 350 mg #120 to be filled after 06/22/2015 until she finds new provider. Explained to her the adverse effects of these medications. Advised not to drive or operate medication while on these medications.

## 2015-06-20 NOTE — Patient Instructions (Signed)
It was great seeing you today! We have addressed the following issues today  1. Pain: I have given you prescriptions for your soma and Lorcet. I would like to remind you that, these medications are sedating and could make you sleepy. I recommend not driving while you are on these medications.    If we did any lab work today, and the results require attention, either me or my nurse will get in touch with you. If everything is normal, you will get a letter in mail. If you don't hear from [place] in two weeks, please give [place] a call. Otherwise, I look forward to talking with you again at our next visit. If you have any questions or concerns before then, please call the clinic at [phone].  Please bring all your medications to every doctors visit   Sign up for My Chart to have easy access to your labs results, and communication with your Primary care physician.    Please check-out at the front desk before leaving the clinic.   Take Care,

## 2015-06-28 ENCOUNTER — Telehealth: Payer: Self-pay | Admitting: Student

## 2015-06-28 DIAGNOSIS — M549 Dorsalgia, unspecified: Principal | ICD-10-CM

## 2015-06-28 DIAGNOSIS — G8929 Other chronic pain: Secondary | ICD-10-CM

## 2015-06-28 NOTE — Telephone Encounter (Signed)
LVM for pt to call the office to inform her of below. Ottis Stain, CMA

## 2015-06-28 NOTE — Telephone Encounter (Signed)
Referral placed.  Please let the patient know.

## 2015-06-28 NOTE — Telephone Encounter (Signed)
Patient requesting a referral to [facility]. Please advise.

## 2015-06-29 NOTE — Telephone Encounter (Signed)
This has already been addressed by Dr. Nori Riis when patient came to her last office visit with Dr. Cyndia Skeeters.  Burna Forts, BSN, RN-BC

## 2015-07-05 ENCOUNTER — Other Ambulatory Visit: Payer: Self-pay | Admitting: *Deleted

## 2015-07-05 DIAGNOSIS — J441 Chronic obstructive pulmonary disease with (acute) exacerbation: Secondary | ICD-10-CM

## 2015-07-07 NOTE — Telephone Encounter (Signed)
Please call the patient and advice her to be evaluated if she has respiratory symptoms.

## 2015-07-07 NOTE — Telephone Encounter (Signed)
Pt states she needs cough med to last her the weekend. She sees her pulmonologist on Monday @ 1015AM.

## 2015-07-10 ENCOUNTER — Telehealth: Payer: Self-pay | Admitting: Pulmonary Disease

## 2015-07-10 ENCOUNTER — Encounter: Payer: Self-pay | Admitting: Pulmonary Disease

## 2015-07-10 ENCOUNTER — Ambulatory Visit (INDEPENDENT_AMBULATORY_CARE_PROVIDER_SITE_OTHER): Payer: Medicare Other | Admitting: Pulmonary Disease

## 2015-07-10 VITALS — BP 142/70 | HR 117 | Ht 64.0 in | Wt 108.2 lb

## 2015-07-10 DIAGNOSIS — Z87891 Personal history of nicotine dependence: Secondary | ICD-10-CM | POA: Diagnosis not present

## 2015-07-10 DIAGNOSIS — F17200 Nicotine dependence, unspecified, uncomplicated: Secondary | ICD-10-CM

## 2015-07-10 DIAGNOSIS — R059 Cough, unspecified: Secondary | ICD-10-CM

## 2015-07-10 DIAGNOSIS — J449 Chronic obstructive pulmonary disease, unspecified: Secondary | ICD-10-CM | POA: Diagnosis not present

## 2015-07-10 DIAGNOSIS — Z720 Tobacco use: Secondary | ICD-10-CM | POA: Diagnosis not present

## 2015-07-10 DIAGNOSIS — R05 Cough: Secondary | ICD-10-CM | POA: Diagnosis not present

## 2015-07-10 MED ORDER — TIOTROPIUM BROMIDE MONOHYDRATE 2.5 MCG/ACT IN AERS: 2.0000 | INHALATION_SPRAY | Freq: Every day | RESPIRATORY_TRACT | Status: DC

## 2015-07-10 MED ORDER — BENZONATATE 100 MG PO CAPS: 100.0000 mg | ORAL_CAPSULE | Freq: Three times a day (TID) | ORAL | Status: DC | PRN
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
## 2015-07-10 NOTE — Telephone Encounter (Signed)
LMTCB x 1 

## 2015-07-10 NOTE — Telephone Encounter (Signed)
Please have her call her insurance company and see what they will cover to allow [place] to have a list to choose from. Thanks.

## 2015-07-10 NOTE — Telephone Encounter (Signed)
PFT 02/08/13: FVC 2.51 L (80%) FEV1 1.91 L (74%) FEV1/FVC 0.75 FEF 25-0 51.51 liters (53%)  IMAGING CT CHEST W/ CONTRAST 02/20/10 (personally reviewed by me): No pleural effusion or thickening appreciated except for possible mild apical thickening. No pericardial effusion. No pathologic mediastinal adenopathy. No parenchymal opacity or nodule appreciated.  MICROBIOLOGY Tracheal Aspirate Ctx (11/03/08):  Negative BAL (11/03/08):  Oral Flora / AFB Negative / Fungal Negative  PATHOLOGY BAL (11/03/08):  Reactive Epithelial Cells  LABS 07/07/14 CBC: 7.0/12.6/39.0/225 BMP: 141/4.3/108/29/7/0.88/83/9.2  11/27/10 HIV:  Negative   11/07/08 ANA:  Negative RF:  <20 C3:  114 C4:  20  11/03/08 BAL Cell Count:  WBC 400 (3% lymph, 9% eos, 84% neutro, 4% macrophages)

## 2015-07-10 NOTE — Patient Instructions (Signed)
   Please call me if you have any new breathing problems before your next appointment  Try to completely quit smoking before your next appointment as this will help your cough and breathing  We will do a breathing & walking test at your next appointment.  I will see you back in 2-3 months or sooner if needed.  TESTS ORDERED: 1. Full pulmonary function testing at follow-up appointment 2. 6 minute walk test on room air at next appointment

## 2015-07-10 NOTE — Telephone Encounter (Signed)
I called spoke with pt. She reports her insurance does not cover the spiriva. She was not given any alternatives. Please advise Dr. Ashok Cordia thanks

## 2015-07-11 NOTE — Telephone Encounter (Signed)
Patient notified that she needs to contact her insurance company to determine alternative for Spiriva.  Patient says she will do so and call us back. Awaiting call back.

## 2015-07-11 NOTE — Telephone Encounter (Signed)
Spoke with pt, states she would like [place] to do a PA for her spiriva.   Pt has tried and failed- combivent, albuterol inhalers, nebulized albuterol neb, atrovent, xopenex inhaler.  Pt uses [employer].   Called pharmacy to have PA form faxed to our office.  Will await fax.

## 2015-07-11 NOTE — Telephone Encounter (Signed)
Pt called back. She reports she was told by medicaid if we call for PA to start PA. Pt reports she wants [place] to do PA for the spiriva. She reports she has tried/failed many inhalers. Please advise thanks

## 2015-07-12 NOTE — Telephone Encounter (Signed)
Initiated PA for Spiriva Resp thru CMM. Key: [place] for review.  [place] ([id number]           [phone]

## 2015-07-14 NOTE — Telephone Encounter (Signed)
Spiriva has been approved until 07/12/2016. Pharmacy informed.

## 2015-07-17 ENCOUNTER — Other Ambulatory Visit: Payer: Self-pay | Admitting: Student

## 2015-07-17 DIAGNOSIS — M62830 Muscle spasm of back: Secondary | ICD-10-CM

## 2015-07-19 ENCOUNTER — Telehealth: Payer: Self-pay | Admitting: Pulmonary Disease

## 2015-07-19 NOTE — Telephone Encounter (Signed)
Last ov with JN on 07/10/15 Patient Instructions      Please call me if you have any new breathing problems before your next appointment  Try to completely quit smoking before your next appointment as this will help your cough and breathing  We will do a breathing & walking test at your next appointment.  I will see you back in 2-3 months or sooner if needed.  TESTS ORDERED: 1. Full pulmonary function testing at follow-up appointment 2. 6 minute walk test on room air at next appointment   Called and spoke with pt. Pt states that the tessalon has not helped her cough. She c/o a dry cough at bedtime, chest tightness/congestion, and wheezing. Denies any sinus pressure/drainage, fever, nausea or vomiting. She is requesting that cheratussin be called into the pharmacy. I offered ov today at with JN at 2:15.  She refused stating that her husband had to be at work at [employer] and would just like a message to be sent to [facility]. I informed her the message would be sent and once we receive his recs we would return her call. She voiced understanding and had no further questions.   JN please advise

## 2015-07-19 NOTE — Telephone Encounter (Signed)
Offer the patient an appointment this week. I would recommend Prednisone 40mg  daily x4 days. Thanks.

## 2015-07-19 NOTE — Telephone Encounter (Signed)
Called spoke with pt. appt scheduled to see SN in the AM at 9am. nothing further needed

## 2015-07-19 NOTE — Telephone Encounter (Signed)
Phoned in her Soma (Carisoprodol) 350 mg #90 to her pharmacy. Going down on quantity by #30 tablets with the hope to wean her off this medication. Carisoprodol should only be used for short periods (2-3 weeks) due to lack of evidence of effectiveness with prolonged use. Since she has been on this for long time, I wean her off slowly to avoid withdrawal syndrome.

## 2015-07-20 ENCOUNTER — Encounter: Payer: Self-pay | Admitting: Pulmonary Disease

## 2015-07-20 ENCOUNTER — Ambulatory Visit (INDEPENDENT_AMBULATORY_CARE_PROVIDER_SITE_OTHER): Payer: Medicare Other | Admitting: Pulmonary Disease

## 2015-07-20 ENCOUNTER — Ambulatory Visit (INDEPENDENT_AMBULATORY_CARE_PROVIDER_SITE_OTHER)
Admission: RE | Admit: 2015-07-20 | Discharge: 2015-07-20 | Disposition: A | Discharge status: Home / Self Care | Payer: Medicare Other | Source: Ambulatory Visit | Attending: Pulmonary Disease | Admitting: Pulmonary Disease

## 2015-07-20 VITALS — BP 112/72 | HR 103 | Temp 97.8°F | Ht 64.0 in | Wt 109.0 lb

## 2015-07-20 DIAGNOSIS — R053 Chronic cough: Secondary | ICD-10-CM

## 2015-07-20 DIAGNOSIS — E46 Unspecified protein-calorie malnutrition: Secondary | ICD-10-CM

## 2015-07-20 DIAGNOSIS — R05 Cough: Secondary | ICD-10-CM

## 2015-07-20 DIAGNOSIS — G894 Chronic pain syndrome: Secondary | ICD-10-CM | POA: Insufficient documentation

## 2015-07-20 DIAGNOSIS — Z87891 Personal history of nicotine dependence: Secondary | ICD-10-CM

## 2015-07-20 DIAGNOSIS — J418 Mixed simple and mucopurulent chronic bronchitis: Secondary | ICD-10-CM

## 2015-07-20 DIAGNOSIS — G4700 Insomnia, unspecified: Secondary | ICD-10-CM

## 2015-07-20 MED ORDER — PREDNISONE 5 MG (21) PO TBPK: ORAL_TABLET | ORAL | Status: DC

## 2015-07-20 MED ORDER — GUAIFENESIN-CODEINE 100-10 MG/5ML PO SYRP: 5.0000 mL | ORAL_SOLUTION | Freq: Four times a day (QID) | ORAL | Status: DC | PRN

## 2015-07-20 MED ORDER — METHYLPREDNISOLONE ACETATE 80 MG/ML IJ SUSP: 80.0000 mg | Freq: Once | INTRAMUSCULAR | Status: DC

## 2015-07-20 MED ORDER — METHYLPREDNISOLONE ACETATE 80 MG/ML IJ SUSP
80.0000 mg | Freq: Once | INTRAMUSCULAR | Status: AC
  Administered 2015-07-20: 80 mg via INTRAMUSCULAR

## 2015-07-20 NOTE — Patient Instructions (Signed)
Today we updated your med list in our EPIC system...    Continue your current medications the same...  Today we checked a Spirometry breathing test, an ambulatory oximetry test, and a CXR...    We will contact you w/ the results when available...   For your airway inflammation and cough>    We gave you a Depo shot & wrote for a Prednisone dosepak- take as directed on the pack...    We also refilled your cough syrup (RobitussinAC generic) to take one tsp every [place] as needed for the severe coughing episodes...  Keep your planned follow up w/DrNestor and your PCP at [facility] practice.Marland KitchenMarland Kitchen
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
## 2015-07-26 ENCOUNTER — Telehealth: Payer: Self-pay | Admitting: *Deleted

## 2015-07-26 NOTE — Telephone Encounter (Signed)
Patient called and left message on my VM.  Patient states she has a new pain clinic MD and pulmonologist.  All she needs now is a "sleeping doctor".  Will it be a problem for her to come get her sleep meds from our office.  Returned call to patient for additional info and left message to call our office back.  Burna Forts, BSN, RN-BC

## 2015-07-30 ENCOUNTER — Emergency Department
Admission: EM | Admit: 2015-07-30 | Discharge: 2015-07-30 | Disposition: A | Discharge status: Home / Self Care | Payer: Medicare Other | Attending: Emergency Medicine | Admitting: Emergency Medicine

## 2015-07-30 ENCOUNTER — Encounter: Payer: Self-pay | Admitting: Emergency Medicine

## 2015-07-30 DIAGNOSIS — S51812A Laceration without foreign body of left forearm, initial encounter: Secondary | ICD-10-CM | POA: Diagnosis not present

## 2015-07-30 DIAGNOSIS — F17210 Nicotine dependence, cigarettes, uncomplicated: Secondary | ICD-10-CM | POA: Insufficient documentation

## 2015-07-30 DIAGNOSIS — Z23 Encounter for immunization: Secondary | ICD-10-CM | POA: Diagnosis not present

## 2015-07-30 DIAGNOSIS — Y9289 Other specified places as the place of occurrence of the external cause: Secondary | ICD-10-CM | POA: Insufficient documentation

## 2015-07-30 DIAGNOSIS — W268XXA Contact with other sharp object(s), not elsewhere classified, initial encounter: Secondary | ICD-10-CM | POA: Diagnosis not present

## 2015-07-30 DIAGNOSIS — Y998 Other external cause status: Secondary | ICD-10-CM | POA: Insufficient documentation

## 2015-07-30 DIAGNOSIS — I10 Essential (primary) hypertension: Secondary | ICD-10-CM | POA: Insufficient documentation

## 2015-07-30 DIAGNOSIS — Z79899 Other long term (current) drug therapy: Secondary | ICD-10-CM | POA: Insufficient documentation

## 2015-07-30 DIAGNOSIS — Y9389 Activity, other specified: Secondary | ICD-10-CM | POA: Insufficient documentation

## 2015-07-30 DIAGNOSIS — S41112A Laceration without foreign body of left upper arm, initial encounter: Secondary | ICD-10-CM

## 2015-07-30 MED ORDER — LIDOCAINE HCL (PF) 1 % IJ SOLN
2.0000 mL | Freq: Once | INTRAMUSCULAR | Status: AC
  Administered 2015-07-30: 2 mL
  Filled 2015-07-30: qty 5

## 2015-07-30 MED ORDER — CEPHALEXIN 500 MG PO CAPS
500.0000 mg | ORAL_CAPSULE | Freq: Once | ORAL | Status: AC
  Administered 2015-07-30: 500 mg via ORAL
  Filled 2015-07-30: qty 1

## 2015-07-30 MED ORDER — TETANUS-DIPHTH-ACELL PERTUSSIS 5-2.5-18.5 LF-MCG/0.5 IM SUSP
0.5000 mL | Freq: Once | INTRAMUSCULAR | Status: AC
  Administered 2015-07-30: 0.5 mL via INTRAMUSCULAR
  Filled 2015-07-30: qty 0.5

## 2015-07-30 MED ORDER — CEPHALEXIN 500 MG PO CAPS: 500.0000 mg | ORAL_CAPSULE | Freq: Three times a day (TID) | ORAL | Status: AC

## 2015-07-30 NOTE — Discharge Instructions (Signed)
Laceration Care, Adult  A laceration is a cut that goes through all layers of the skin. The cut also goes into the tissue that is right under the skin. Some cuts heal on their own. Others need to be closed with stitches (sutures), staples, skin adhesive strips, or wound glue. Taking care of your cut lowers your risk of infection and helps your cut to heal better.  HOW TO TAKE CARE OF YOUR CUT  For stitches or staples:  · Keep the wound clean and dry.  · If you were given a bandage (dressing), you should change it at least one time per day or as told by your doctor. You should also change it if it gets wet or dirty.  · Keep the wound completely dry for the first 24 hours or as told by your doctor. After that time, you may take a shower or a bath. However, make sure that the wound is not soaked in water until after the stitches or staples have been removed.  · Clean the wound one time each day or as told by your doctor:    Wash the wound with soap and water.    Rinse the wound with water until all of the soap comes off.    Pat the wound dry with a clean towel. Do not rub the wound.  · After you clean the wound, put a thin layer of antibiotic ointment on it as told by your doctor. This ointment:    Helps to prevent infection.    Keeps the bandage from sticking to the wound.  · Have your stitches or staples removed as told by your doctor.  If your doctor used skin adhesive strips:   · Keep the wound clean and dry.  · If you were given a bandage, you should change it at least one time per day or as told by your doctor. You should also change it if it gets dirty or wet.  · Do not get the skin adhesive strips wet. You can take a shower or a bath, but be careful to keep the wound dry.  · If the wound gets wet, pat it dry with a clean towel. Do not rub the wound.  · Skin adhesive strips fall off on their own. You can trim the strips as the wound heals. Do not remove any strips that are still stuck to the wound. They will  fall off after a while.  If your doctor used wound glue:  · Try to keep your wound dry, but you may briefly wet it in the shower or bath. Do not soak the wound in water, such as by swimming.  · After you take a shower or a bath, gently pat the wound dry with a clean towel. Do not rub the wound.  · Do not do any activities that will make you really sweaty until the skin glue has fallen off on its own.  · Do not apply liquid, cream, or ointment medicine to your wound while the skin glue is still on.  · If you were given a bandage, you should change it at least one time per day or as told by your doctor. You should also change it if it gets dirty or wet.  · If a bandage is placed over the wound, do not let the tape for the bandage touch the skin glue.  · Do not pick at the glue. The skin glue usually stays on for 5-10 days. Then, it   or when wound glue stays in place and the wound is healed. Make sure to wear a sunscreen of at least 30 SPF.  Take over-the-counter and prescription medicines only as told by your doctor.  If you were given antibiotic medicine or ointment, take or apply it as told by your doctor. Do not stop using the antibiotic even if your wound is getting better.  Do not scratch or pick at the wound.  Keep all follow-up visits as told by your doctor. This is important.  Check your wound every day for signs of infection. Watch for:  Redness, swelling, or pain.  Fluid, blood, or pus.  Raise (elevate) the injured area above the level of your heart while you are sitting or lying down, if possible. GET HELP IF:  You got a tetanus shot and you have any of these problems at the injection site:  Swelling.  Very bad pain.  Redness.  Bleeding.  You have a fever.  A wound that was  closed breaks open.  You notice a bad smell coming from your wound or your bandage.  You notice something coming out of the wound, such as wood or glass.  Medicine does not help your pain.  You have more redness, swelling, or pain at the site of your wound.  You have fluid, blood, or pus coming from your wound.  You notice a change in the color of your skin near your wound.  You need to change the bandage often because fluid, blood, or pus is coming from the wound.  You start to have a new rash.  You start to have numbness around the wound. GET HELP RIGHT AWAY IF:  You have very bad swelling around the wound.  Your pain suddenly gets worse and is very bad.  You notice painful lumps near the wound or on skin that is anywhere on your body.  You have a red streak going away from your wound.  The wound is on your hand or foot and you cannot move a finger or toe like you usually can.  The wound is on your hand or foot and you notice that your fingers or toes look pale or bluish.   This information is not intended to replace advice given to you by your health care provider. Make sure you discuss any questions you have with your health care provider.   Document Released: 10/09/2007 Document Revised: 09/06/2014 Document Reviewed: 04/18/2014 Elsevier Interactive Patient Education [employer].   Follow-up in 8 days for suture removal.
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
## 2015-07-30 NOTE — ED Notes (Signed)
Pt reports cut left wrist on an old rusty piece of metal.  Wrapped in triage.

## 2015-08-08 NOTE — Telephone Encounter (Signed)
Paula Duncan have an appt with the pain mgt clinic in [place], but was informed by them that she would not be given a rx for pain meds on first visit.  She wanted to know what is her recourse to refill her medication.  Also she cannot get refills on her trazodone or mirtazapine they do not refill those.

## 2015-08-10 ENCOUNTER — Emergency Department
Admission: EM | Admit: 2015-08-10 | Discharge: 2015-08-10 | Disposition: A | Discharge status: Home / Self Care | Payer: Medicare Other | Attending: Emergency Medicine | Admitting: Emergency Medicine

## 2015-08-10 ENCOUNTER — Encounter: Payer: Self-pay | Admitting: Emergency Medicine

## 2015-08-10 DIAGNOSIS — Z4802 Encounter for removal of sutures: Secondary | ICD-10-CM | POA: Diagnosis not present

## 2015-08-10 DIAGNOSIS — E785 Hyperlipidemia, unspecified: Secondary | ICD-10-CM | POA: Diagnosis not present

## 2015-08-10 DIAGNOSIS — I10 Essential (primary) hypertension: Secondary | ICD-10-CM | POA: Insufficient documentation

## 2015-08-10 DIAGNOSIS — J449 Chronic obstructive pulmonary disease, unspecified: Secondary | ICD-10-CM | POA: Diagnosis not present

## 2015-08-10 DIAGNOSIS — F17210 Nicotine dependence, cigarettes, uncomplicated: Secondary | ICD-10-CM | POA: Diagnosis not present

## 2015-08-10 MED ORDER — LIDOCAINE-EPINEPHRINE (PF) 1 %-1:200000 IJ SOLN
10.0000 mL | Freq: Once | INTRAMUSCULAR | Status: DC
  Filled 2015-08-10: qty 30
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
## 2015-08-10 NOTE — Discharge Instructions (Signed)

## 2015-08-10 NOTE — ED Notes (Signed)
Patient to ER for removal of sutures to left forearm.

## 2015-08-10 NOTE — ED Notes (Signed)
Sutured removed by Cuthriel PA, no bleeding noted.

## 2015-08-11 ENCOUNTER — Ambulatory Visit: Payer: Medicare Other | Attending: Anesthesiology | Admitting: Anesthesiology

## 2015-08-11 ENCOUNTER — Encounter: Payer: Self-pay | Admitting: Anesthesiology

## 2015-08-11 VITALS — BP 133/61 | HR 96 | Temp 98.1°F | Resp 16 | Ht 64.0 in | Wt 109.0 lb

## 2015-08-11 DIAGNOSIS — K319 Disease of stomach and duodenum, unspecified: Secondary | ICD-10-CM | POA: Diagnosis not present

## 2015-08-11 DIAGNOSIS — M5136 Other intervertebral disc degeneration, lumbar region: Secondary | ICD-10-CM | POA: Insufficient documentation

## 2015-08-11 DIAGNOSIS — Z981 Arthrodesis status: Secondary | ICD-10-CM | POA: Insufficient documentation

## 2015-08-11 DIAGNOSIS — M545 Low back pain, unspecified: Secondary | ICD-10-CM

## 2015-08-11 DIAGNOSIS — Z9889 Other specified postprocedural states: Secondary | ICD-10-CM | POA: Insufficient documentation

## 2015-08-11 DIAGNOSIS — Z87828 Personal history of other (healed) physical injury and trauma: Secondary | ICD-10-CM | POA: Insufficient documentation

## 2015-08-11 DIAGNOSIS — J449 Chronic obstructive pulmonary disease, unspecified: Secondary | ICD-10-CM | POA: Diagnosis not present

## 2015-08-11 DIAGNOSIS — G8929 Other chronic pain: Secondary | ICD-10-CM | POA: Diagnosis not present

## 2015-08-11 DIAGNOSIS — F17210 Nicotine dependence, cigarettes, uncomplicated: Secondary | ICD-10-CM | POA: Diagnosis not present

## 2015-08-11 NOTE — Progress Notes (Signed)
Safety precautions to be maintained throughout the outpatient stay will include: orient to surroundings, keep bed in low position, maintain call bell within reach at all times, provide assistance with transfer out of bed and ambulation.  

## 2015-08-11 NOTE — Patient Instructions (Signed)

## 2015-08-14 ENCOUNTER — Emergency Department
Admission: EM | Admit: 2015-08-14 | Discharge: 2015-08-14 | Disposition: A | Discharge status: Home / Self Care | Payer: Medicare Other | Attending: Emergency Medicine | Admitting: Emergency Medicine

## 2015-08-14 DIAGNOSIS — F17210 Nicotine dependence, cigarettes, uncomplicated: Secondary | ICD-10-CM | POA: Insufficient documentation

## 2015-08-14 DIAGNOSIS — Y929 Unspecified place or not applicable: Secondary | ICD-10-CM | POA: Diagnosis not present

## 2015-08-14 DIAGNOSIS — I493 Ventricular premature depolarization: Secondary | ICD-10-CM | POA: Insufficient documentation

## 2015-08-14 DIAGNOSIS — I10 Essential (primary) hypertension: Secondary | ICD-10-CM | POA: Insufficient documentation

## 2015-08-14 DIAGNOSIS — G894 Chronic pain syndrome: Secondary | ICD-10-CM | POA: Diagnosis not present

## 2015-08-14 DIAGNOSIS — W230XXA Caught, crushed, jammed, or pinched between moving objects, initial encounter: Secondary | ICD-10-CM | POA: Insufficient documentation

## 2015-08-14 DIAGNOSIS — S61411A Laceration without foreign body of right hand, initial encounter: Secondary | ICD-10-CM | POA: Diagnosis not present

## 2015-08-14 DIAGNOSIS — Y999 Unspecified external cause status: Secondary | ICD-10-CM | POA: Diagnosis not present

## 2015-08-14 DIAGNOSIS — Y9389 Activity, other specified: Secondary | ICD-10-CM | POA: Diagnosis not present

## 2015-08-14 DIAGNOSIS — J449 Chronic obstructive pulmonary disease, unspecified: Secondary | ICD-10-CM | POA: Diagnosis not present

## 2015-08-14 DIAGNOSIS — E785 Hyperlipidemia, unspecified: Secondary | ICD-10-CM | POA: Diagnosis not present

## 2015-08-14 NOTE — ED Notes (Signed)
AAOx3.  Skin warm and dry.  NAD 

## 2015-08-14 NOTE — ED Notes (Signed)
Pt reports that she busted right middle knuckle yesterday while trying to catch her dog when he was going out of the aluminum front door - Pt got tetanus shot a week and a half ago when she received sutures

## 2015-08-14 NOTE — Discharge Instructions (Signed)
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
## 2015-08-14 NOTE — ED Notes (Signed)
Patient ambulatory to triage with steady gait, without difficulty or distress noted; pt reports cutting right 3rd knuckle yesterday on door
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
## 2015-08-17 ENCOUNTER — Telehealth: Payer: Self-pay | Admitting: Student

## 2015-08-17 LAB — TOXASSURE SELECT 13 (MW), URINE: PDF: 0

## 2015-08-17 NOTE — Telephone Encounter (Signed)
I would like to discuss about these and other medications with her when I see her in clinic on 4/17. Thank you!

## 2015-08-21 ENCOUNTER — Ambulatory Visit (INDEPENDENT_AMBULATORY_CARE_PROVIDER_SITE_OTHER): Payer: Medicare Other | Admitting: Student

## 2015-08-21 ENCOUNTER — Encounter: Payer: Self-pay | Admitting: Student

## 2015-08-21 VITALS — BP 148/63 | HR 88 | Temp 97.7°F | Ht 64.0 in | Wt 105.9 lb

## 2015-08-21 DIAGNOSIS — M62830 Muscle spasm of back: Secondary | ICD-10-CM

## 2015-08-21 DIAGNOSIS — G894 Chronic pain syndrome: Secondary | ICD-10-CM | POA: Diagnosis not present

## 2015-08-21 DIAGNOSIS — G8929 Other chronic pain: Secondary | ICD-10-CM

## 2015-08-21 DIAGNOSIS — G4700 Insomnia, unspecified: Secondary | ICD-10-CM | POA: Diagnosis not present

## 2015-08-21 DIAGNOSIS — M5440 Lumbago with sciatica, unspecified side: Secondary | ICD-10-CM

## 2015-08-21 MED ORDER — TRAZODONE HCL 100 MG PO TABS: 100.0000 mg | ORAL_TABLET | Freq: Every day | ORAL | Status: DC

## 2015-08-21 MED ORDER — CALAMINE EX LOTN: 1.0000 "application " | TOPICAL_LOTION | CUTANEOUS | Status: DC | PRN

## 2015-08-21 MED ORDER — CARISOPRODOL 350 MG PO TABS: 350.0000 mg | ORAL_TABLET | Freq: Every evening | ORAL | Status: DC | PRN

## 2015-08-21 MED ORDER — CARISOPRODOL 350 MG PO TABS: ORAL_TABLET | ORAL | Status: DC

## 2015-08-21 NOTE — Progress Notes (Signed)
   Subjective:    Patient ID: Paula Duncan, female    DOB: 1961-01-21, 55 y.o.   MRN: [id number]  HPI #Low back pain: pain constant. Pain "ompressing" in nature. Intensity 6/10 now. 8-9/10 at its worst. No fever. No saddle parasthesia. No incontinence. Right thigh numb ateriroly. No weakness, numbness, tingling else where. Overall, no improvement or worsening.  She says she don't want to get surgery. She was a pain clinic few weeks ago. She wasn't given pain meds at first encounter. She is upset with me saying "you are taking away my pain and sleep medication one after another".   #Poison ivy: this has been going on for 4-5 days. She was digging in grass about 5 days ago when it started. Initially blisters. She popped them. Now scabbed. She is still itchy. This is on her legs and arms. Tried benadryl, bleach and deodrant which didn't help.   Review of Systems  Constitutional: Negative for fever and chills.  Respiratory: Positive for cough.   Genitourinary: Negative for hematuria.  Musculoskeletal: Positive for back pain.  Skin: Positive for itching and rash.    Objective:   Physical Exam Filed Vitals:   08/21/15 1502  BP: 148/63  Pulse: 88  Temp: 97.7 F (36.5 C)  TempSrc: Oral  Height: 5\' 4"  (1.626 m)  Weight: 105 lb 14.4 oz (48.036 kg)   Gen: appears nervous and upset Skin: scattered papules over upper and lower extremities. Some scratch signs MSK: tender to palpation over her lower back, mild paraspinal muscle tenderness as well.    Assessment & Plan:  BACK PAIN, LOW Usual pain. No red flags.  Gave her Soma #30. This is mainly to avoid withdrawal than treating her pain at this time. Told her that this is the last prescription of this medication I am giving her and she voices understanding. She will be followed by pain management next. Collected urine for UDS.   Skin rash: her description of rash progress from blisters to scabbed lesion suggestive for this. No constitutional  symtoms to suggest infectious process. Advised her to use calamine lotion with Zyrtec or Claritin.

## 2015-08-21 NOTE — Patient Instructions (Signed)
It was great seeing you today! We have addressed the following issues today  1. Skin rash: get calamine lotion over th counter and apply to skin daily 2. Back pain: I have given you prescription for soma. This is the last prescription for this medication from me.     If we did any lab work today, and the results require attention, either me or my nurse will get in touch with you. If everything is normal, you will get a letter in mail. If you don't hear from [place] in two weeks, please give [place] a call. Otherwise, I look forward to talking with you again at our next visit. If you have any questions or concerns before then, please call the clinic at [phone].  Please bring all your medications to every doctors visit   Sign up for My Chart to have easy access to your labs results, and communication with your Primary care physician.    Please check-out at the front desk before leaving the clinic.   Take Care,

## 2015-08-21 NOTE — Assessment & Plan Note (Signed)
Usual pain. No red flags.  Gave her Soma #30. This is mainly to avoid withdrawal than treating her pain at this time. Told her that this is the last prescription of this medication I am giving her and she voices understanding. She will be followed by pain management next. Collected urine for UDS.

## 2015-08-22 LAB — DRUG SCR UR, PAIN MGMT, REFLEX CONF
AMPHETAMINE SCRN UR: NEGATIVE
BARBITURATE QUANT UR: NEGATIVE
BENZODIAZEPINES.: NEGATIVE
COCAINE METABOLITES: NEGATIVE
CREATININE, U: 50.43 mg/dL
METHADONE: NEGATIVE
Marijuana Metabolite: NEGATIVE
PHENCYCLIDINE (PCP): NEGATIVE
PROPOXYPHENE: NEGATIVE

## 2015-08-22 NOTE — Telephone Encounter (Signed)
Patient is dissatisfied with the outcome of her last appt with her provider.  Unhappy about the change in her rx,.  Want to speak with an office mgr regarding this

## 2015-08-23 ENCOUNTER — Telehealth: Payer: Self-pay | Admitting: Anesthesiology

## 2015-08-23 NOTE — Telephone Encounter (Signed)
Per KW patient called and requested to schedule with Dr Dossie Arbour instead of Dr Idelia Salm - patient seen Dr Idelia Salm 08/11/2015 And she is scheduled for follow up appointment 09/08/2015.  Referring notes are not scanned into EPIC as of today   Dr Idelia Salm note of 08/11/2015 to Dr Dossie Arbour for review

## 2015-08-25 LAB — OPIATES/OPIOIDS (LC/MS-MS)
CODEINE URINE: 211 ng/mL — AB (ref <50)
HYDROCODONE: NEGATIVE ng/mL (ref <50)
HYDROMORPHONE: NEGATIVE ng/mL (ref <50)
Morphine Urine: 308 ng/mL — ABNORMAL HIGH (ref <50)
NOROXYCODONE, UR: NEGATIVE ng/mL (ref <50)
Norhydrocodone, Ur: NEGATIVE ng/mL (ref <50)
Oxycodone, ur: NEGATIVE ng/mL (ref <50)
Oxymorphone: NEGATIVE ng/mL (ref <50)

## 2015-08-28 ENCOUNTER — Other Ambulatory Visit: Payer: Self-pay | Admitting: Student

## 2015-08-28 DIAGNOSIS — G8929 Other chronic pain: Secondary | ICD-10-CM

## 2015-08-28 DIAGNOSIS — M62830 Muscle spasm of back: Secondary | ICD-10-CM

## 2015-08-28 DIAGNOSIS — M5440 Lumbago with sciatica, unspecified side: Secondary | ICD-10-CM

## 2015-08-28 NOTE — Telephone Encounter (Signed)
Need to speak with Asst Director regarding changing doctors.

## 2015-08-30 NOTE — Telephone Encounter (Signed)
Patient calling back to ask that you speak with Dr. Erin Hearing to write hr rx for her hydrocodone.

## 2015-08-31 NOTE — Telephone Encounter (Signed)
I am not PCP for this patient will route to Dr Cyndia Skeeters to review  Thanks  [place]

## 2015-08-31 NOTE — Telephone Encounter (Signed)
I no longer manage her pain medication. She is going to pain clinic. She have already signed pain contract there. I gave her the last prescription for Soma, #30, not treat her pain but to prevent withdrawal from this medication. I have discussed this with her. Please, call and let her know. Thanks

## 2015-08-31 NOTE — Telephone Encounter (Signed)
Pt also asking for her soma to be refilled.

## 2015-08-31 NOTE — Telephone Encounter (Signed)
Pt is calling back to see if Dr. Erin Hearing is going to write her hydrocodone for her. jw

## 2015-09-01 ENCOUNTER — Telehealth: Payer: Self-pay | Admitting: Student

## 2015-09-01 NOTE — Telephone Encounter (Signed)
Patient calls about her pain med refill. I advised her of the message from Dr. Cyndia Skeeters. Pt then tells me that she was not able to get any meds from pain clinic on her first visit which was was 08/11/15. She was to return to see them on 09/08/15 but had to reschedule due to conflict with another MD appt. Patient is now scheduled to pain MD on 09/22/15 and will then receive meds. Pt will be out of meds for 2.5 weeks. Would like to know if Dr. Cyndia Skeeters can give her enough to last until then. Please advise.

## 2015-09-01 NOTE — Telephone Encounter (Signed)
Pt called to check the status of her request for her pain medication. She said that YES she signed a pain contract with the pain clinic but they have NOT given her any pain medication and will NOT be giving her any medication for at least 2 1/2 weeks. So this means she will be out of her pain medication until then and she can not be without this medication. She said that by law her PCP has to fill this until the pain clinic starts doing this for her. jw

## 2015-09-01 NOTE — Telephone Encounter (Signed)
This message was given to pt and handled on another phone note. Katharina Caper, April D, [place]

## 2015-09-04 NOTE — Telephone Encounter (Signed)
She canceled her appointment with pain clinic on 5/5 and rescheduled it for 5/19. So, I won't fill her medication.  Thank you!

## 2015-09-04 NOTE — Telephone Encounter (Signed)
Spoke with patient and she states that she changed her pain clinic appt due to having a back appt with Dr. Amedeo Plenty in [place] on the same day and time.  I informed patient that I would send this message but that she needs to check with pain clinic regarding her contract.  I advised her that most pain contracts are active once it has been signed by patient not necessarily the day she starts receiving medication from the.  She voiced understanding and will call their office to check on this. Jazmin Hartsell,CMA

## 2015-09-05 ENCOUNTER — Telehealth: Payer: Self-pay | Admitting: *Deleted

## 2015-09-05 NOTE — Telephone Encounter (Signed)
Patient came in requesting to speak with Medical Director (Dr. Nori Riis) to discuss filing a lawsuit for medical malpractice regarding Dr. Cyndia Skeeters recently denying refilling her hydrocodone Rx.  Patient is also being tapered down on Soma and was given #20 for her last refill to "prevent withdrawals".  States she had an appt with pain clinic in [facility], but it had to be changed to 09/22/15 due to a scheduling conflict.  Therefore, she has not seen a doctor yet to discuss her pain and prescribing something for pain.  Is currently out of all of her pain meds.  Last filled hydrocodone last month--takes med TID for back pain that radiates down to her legs.  Had spinal fusion >10 years ago and the pain is impairing her "enjoyment of life".  Informed her concerns will be forwarded to Drs. Nori Riis and [place], and she will hear back from our office regarding her medication request.  Patient verbalized understanding and agreeable to plan.    Burna Forts, BSN, RN-BC

## 2015-09-06 NOTE — Telephone Encounter (Signed)
Patient calling back to speak with Jeanett Schlein, wants to know the names of 'our lawyers'

## 2015-09-06 NOTE — Telephone Encounter (Signed)
Please see phone note for 09/01/15.  Burna Forts, BSN, RN-BC

## 2015-09-06 NOTE — Telephone Encounter (Signed)
Paula Duncan I have spoken to her about this on the phone when I discussed with her on June 13, 2015. If she wants to discuss with me specifically further, I will be happy to do that. Due to my schedule I am not always available if she just "drops in" but we would be happy to make appointment so I can discuss with her. THANKS! Dorcas Mcmurray

## 2015-09-06 NOTE — Telephone Encounter (Signed)
Called and spoke with patient.  Informed her that I do not have the names of attorneys for [facility].  She may be able to find that info with the Office of Patient Experience.  Also, informed of message from Dr. Nori Riis to have patient schedule an appt to meet with her to discuss her concerns.  Patient has an out-of-town appt on Friday and is available anytime after 1:30 pm.  Will discuss with Dr. Nori Riis and let patient know if that time works.  Burna Forts, BSN, RN-BC

## 2015-09-08 ENCOUNTER — Ambulatory Visit: Payer: Medicare Other | Admitting: Anesthesiology

## 2015-09-08 ENCOUNTER — Encounter: Payer: Self-pay | Admitting: Family Medicine

## 2015-09-08 DIAGNOSIS — M4326 Fusion of spine, lumbar region: Secondary | ICD-10-CM | POA: Diagnosis not present

## 2015-09-08 DIAGNOSIS — M4806 Spinal stenosis, lumbar region: Secondary | ICD-10-CM | POA: Diagnosis not present

## 2015-09-08 NOTE — Progress Notes (Unsigned)
[name] came by today to discuss her complaints about [place] stopping her narcotics. I spoke with her in presence of Ms. Burna Forts RN. I answered her questions and again explained that I thohght Dr. Elizbeth Squires was providing good care. We had referred her to the [facility] and she went for the first appointment. She told me she had to cancel and reschedule her follow up appointment with them. She was unhappy because they would not give her any pain medicine on first appointment and that since she had cancelled her second appiontment she was totally out of her pain medicine. I re-iterated that she needed to be seen and followed by a Pain Medicine specialist--[facility] will not be providing chronic pain medicine care for her. She abruptly ended the discussion and walked out.

## 2015-09-11 ENCOUNTER — Other Ambulatory Visit: Payer: Self-pay | Admitting: Orthopedic Surgery

## 2015-09-12 ENCOUNTER — Other Ambulatory Visit: Payer: Self-pay | Admitting: Orthopedic Surgery

## 2015-09-12 DIAGNOSIS — M545 Low back pain: Secondary | ICD-10-CM

## 2015-09-14 ENCOUNTER — Ambulatory Visit: Payer: Medicare Other | Admitting: Pulmonary Disease

## 2015-09-14 ENCOUNTER — Encounter: Payer: Self-pay | Admitting: Pulmonary Disease

## 2015-09-14 ENCOUNTER — Ambulatory Visit (INDEPENDENT_AMBULATORY_CARE_PROVIDER_SITE_OTHER): Payer: Medicare Other | Admitting: Pulmonary Disease

## 2015-09-14 VITALS — BP 102/60 | HR 93 | Ht 64.0 in | Wt 109.0 lb

## 2015-09-14 DIAGNOSIS — J449 Chronic obstructive pulmonary disease, unspecified: Secondary | ICD-10-CM

## 2015-09-14 DIAGNOSIS — F17200 Nicotine dependence, unspecified, uncomplicated: Secondary | ICD-10-CM

## 2015-09-14 DIAGNOSIS — R05 Cough: Secondary | ICD-10-CM

## 2015-09-14 DIAGNOSIS — R053 Chronic cough: Secondary | ICD-10-CM

## 2015-09-14 DIAGNOSIS — R0600 Dyspnea, unspecified: Secondary | ICD-10-CM

## 2015-09-14 DIAGNOSIS — R059 Cough, unspecified: Secondary | ICD-10-CM

## 2015-09-14 LAB — PULMONARY FUNCTION TEST
DL/VA % pred: 66 %
DL/VA: 3.18 ml/min/mmHg/L
DLCO COR % PRED: 58 %
DLCO COR: 14.28 ml/min/mmHg
DLCO UNC % PRED: 56 %
DLCO unc: 13.63 ml/min/mmHg
FEF 25-75 Post: 1.81 L/sec
FEF 25-75 Pre: 1.57 L/sec
FEF2575-%CHANGE-POST: 15 %
FEF2575-%PRED-POST: 73 %
FEF2575-%Pred-Pre: 63 %
FEV1-%CHANGE-POST: 3 %
FEV1-%PRED-POST: 77 %
FEV1-%PRED-PRE: 75 %
FEV1-POST: 2.05 L
FEV1-Pre: 1.98 L
FEV1FVC-%CHANGE-POST: 2 %
FEV1FVC-%Pred-Pre: 96 %
FEV6-%Change-Post: 1 %
FEV6-%Pred-Post: 80 %
FEV6-%Pred-Pre: 79 %
FEV6-PRE: 2.59 L
FEV6-Post: 2.62 L
FEV6FVC-%Change-Post: 0 %
FEV6FVC-%PRED-PRE: 102 %
FEV6FVC-%Pred-Post: 103 %
FVC-%Change-Post: 0 %
FVC-%PRED-POST: 77 %
FVC-%Pred-Pre: 76 %
FVC-PRE: 2.6 L
FVC-Post: 2.63 L
POST FEV1/FVC RATIO: 78 %
PRE FEV6/FVC RATIO: 100 %
Post FEV6/FVC ratio: 100 %
Pre FEV1/FVC ratio: 76 %
RV % pred: 116 %
RV: 2.25 L
TLC % PRED: 95 %
TLC: 4.84 L

## 2015-09-14 NOTE — Progress Notes (Signed)
PFT done today. 

## 2015-09-14 NOTE — Progress Notes (Signed)
Test reviewed.  

## 2015-09-14 NOTE — Patient Instructions (Signed)
   Remember to submit your sputum specimen to the hospital lab within 4 hours of getting it up. Early morning specimens are the best but the key is that it should be a good, deep specimen. You don't need to fill the cup up. And remember not to refrigerate the specimen.   We will follow-up with you after your CT scan to discuss where we're going from here.  Remember to use your Acapella/Flutter Valve twice daily. Blow into it hard 3 times in a row morning & night.   We will see you back in 4-6 weeks but call if you have any questions or concerns.  TESTS ORDERED: 1. Culture for AFB, fungus, and bacteria 2. High-resolution CT chest without contrast
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
## 2015-09-15 ENCOUNTER — Other Ambulatory Visit: Payer: Self-pay | Admitting: Pulmonary Disease

## 2015-09-15 MED ORDER — FLUTTER DEVI: Status: DC

## 2015-09-15 NOTE — Addendum Note (Signed)
Addended by: Parke Poisson E on: 09/15/2015 03:04 PM   Modules accepted: Orders

## 2015-09-21 ENCOUNTER — Ambulatory Visit
Admission: RE | Admit: 2015-09-21 | Discharge: 2015-09-21 | Disposition: A | Discharge status: Home / Self Care | Payer: Medicare Other | Source: Ambulatory Visit | Attending: Orthopedic Surgery | Admitting: Orthopedic Surgery

## 2015-09-21 DIAGNOSIS — M545 Low back pain: Secondary | ICD-10-CM

## 2015-09-21 DIAGNOSIS — M4806 Spinal stenosis, lumbar region: Secondary | ICD-10-CM | POA: Diagnosis not present

## 2015-09-22 ENCOUNTER — Ambulatory Visit
Admission: RE | Admit: 2015-09-22 | Discharge: 2015-09-22 | Disposition: A | Discharge status: Home / Self Care | Payer: Medicare Other | Source: Ambulatory Visit | Attending: Pulmonary Disease | Admitting: Pulmonary Disease

## 2015-09-22 ENCOUNTER — Ambulatory Visit: Payer: Medicare Other | Attending: Anesthesiology | Admitting: Anesthesiology

## 2015-09-22 ENCOUNTER — Inpatient Hospital Stay
Admission: RE | Admit: 2015-09-22 | Discharge: 2015-09-22 | Disposition: A | Discharge status: Home / Self Care | Payer: Medicare Other | Source: Ambulatory Visit | Attending: Orthopedic Surgery | Admitting: Orthopedic Surgery

## 2015-09-22 ENCOUNTER — Encounter: Payer: Self-pay | Admitting: Anesthesiology

## 2015-09-22 VITALS — BP 127/62 | HR 86 | Temp 98.3°F | Resp 16 | Ht 64.0 in | Wt 109.0 lb

## 2015-09-22 DIAGNOSIS — M5136 Other intervertebral disc degeneration, lumbar region: Secondary | ICD-10-CM | POA: Diagnosis not present

## 2015-09-22 DIAGNOSIS — R05 Cough: Secondary | ICD-10-CM | POA: Diagnosis present

## 2015-09-22 DIAGNOSIS — M5116 Intervertebral disc disorders with radiculopathy, lumbar region: Secondary | ICD-10-CM | POA: Insufficient documentation

## 2015-09-22 DIAGNOSIS — I2510 Atherosclerotic heart disease of native coronary artery without angina pectoris: Secondary | ICD-10-CM | POA: Insufficient documentation

## 2015-09-22 DIAGNOSIS — M545 Low back pain: Secondary | ICD-10-CM | POA: Insufficient documentation

## 2015-09-22 DIAGNOSIS — R918 Other nonspecific abnormal finding of lung field: Secondary | ICD-10-CM | POA: Insufficient documentation

## 2015-09-22 DIAGNOSIS — G8929 Other chronic pain: Secondary | ICD-10-CM | POA: Insufficient documentation

## 2015-09-22 DIAGNOSIS — R0602 Shortness of breath: Secondary | ICD-10-CM | POA: Diagnosis not present

## 2015-09-22 DIAGNOSIS — M5440 Lumbago with sciatica, unspecified side: Secondary | ICD-10-CM

## 2015-09-22 DIAGNOSIS — R053 Chronic cough: Secondary | ICD-10-CM

## 2015-09-22 DIAGNOSIS — Z981 Arthrodesis status: Secondary | ICD-10-CM | POA: Diagnosis not present

## 2015-09-22 DIAGNOSIS — G4700 Insomnia, unspecified: Secondary | ICD-10-CM

## 2015-09-22 MED ORDER — TIZANIDINE HCL 4 MG PO TABS: 4.0000 mg | ORAL_TABLET | Freq: Three times a day (TID) | ORAL | Status: DC | PRN

## 2015-09-22 MED ORDER — HYDROCODONE-ACETAMINOPHEN 10-325 MG PO TABS: 1.0000 | ORAL_TABLET | Freq: Two times a day (BID) | ORAL | Status: DC

## 2015-09-22 MED ORDER — TRAZODONE HCL 100 MG PO TABS: 100.0000 mg | ORAL_TABLET | Freq: Every day | ORAL | Status: DC

## 2015-09-22 NOTE — Progress Notes (Signed)
Safety precautions to be maintained throughout the outpatient stay will include: orient to surroundings, keep bed in low position, maintain call bell within reach at all times, provide assistance with transfer out of bed and ambulation.  

## 2015-09-22 NOTE — Patient Instructions (Signed)
You were given prescriptions for Tizanidine, Trazodone, and Hydrocodone today.

## 2015-09-25 ENCOUNTER — Telehealth: Payer: Self-pay | Admitting: *Deleted

## 2015-09-25 NOTE — Telephone Encounter (Signed)
sw pt made her aware that Dr. Idelia Salm will be out of the office 10/11/15. Gave an appt for 10/06/15@ 11:15am. Pt is aware...td

## 2015-09-26 ENCOUNTER — Telehealth: Payer: Self-pay | Admitting: Pulmonary Disease

## 2015-09-26 NOTE — Progress Notes (Signed)
   Subjective:    Patient ID: Paula Duncan, female    DOB: [birth year], 55 y.o.   MRN: [id number]  HPI  This patient return to the  Clinic today indicating that she had a consultation with her surgeon who has ordered a CT and MRI of her lumbar spine She is continuing to have severe pain Subjective pain intensity rating is 80% Her past physician had her on large doses of  And very large doses of Paula Duncan I told upfront as I told her again today that I would not does doses of opioids and that I would under no circumstances give her Paula Duncan which is highly addictive She agreed to accept my recommendations and on that basis I would  Continued to see her and treat her  Review of Systems  Constitutional: Negative.   HENT: Negative.   Eyes: Negative.   Respiratory: Negative.   Cardiovascular: Negative.        She has a history of PVCs and essential hypertensio  Endocrine: Negative.   Genitourinary: Negative.   Musculoskeletal: Positive for myalgias, back pain, arthralgias and gait problem. Negative for joint swelling, neck pain and neck stiffness.       He has a long history of chronic low back pain  with severe degenerative disc   Skin: Negative.   Allergic/Immunologic: Negative.   Neurological: Negative.   Hematological: Negative.   Psychiatric/Behavioral: Negative.        Objective:   Physical Exam  Cardiovascular:  This patient appears to be in mild-to-moderate distress Blood pressure is 127/62 millimeters of mercury Pulse is 86 beats per minute Equal and regular Heart sounds 1 and 2 were heard in all areas There were no audible murmurs  temperature was 98.3Fahrenheit Respirations were 16 breaths per minute SPO2 was 98% Chest is clinically clear There were no adventitious sounds Abdomen is soft and nontender There was no palpable organomegaly There was no significant lymphadenopat Pupils were equal and reactive Cranial nerves were intact There were no new neurological no  musculo-skeletal findings  Nursing note and vitals reviewed.         Assessment & Plan:   Assessment 1 chronic low back pain 2  lumbar degenerative disc disease 3 lumbar radiculopathy   Plan of management 11 for Zanaflex 4 mg 3 times a day # to 2 tablets 2 trazodone 100 mg daily at bedtime # 30 tablets 3 lorcet 10/325 mg every 12 H  &  PRN # 30 tablets 4 to come back in 2 weeks for follow-up    Established patient       Level Palomas M.D.

## 2015-09-26 NOTE — Telephone Encounter (Signed)
Spoke with the pt  She is requesting ct chest results from 09/22/15  Dr. Ashok Cordia, please advise, thanks!

## 2015-09-27 NOTE — Telephone Encounter (Signed)
Please let the patient know I reviewed her CT scan. It shows findings consistent with smoking related lung disease. As long as she continues to avoid cigarette use and inhaling second-hand smoke it should not progress. Some of it may reverse with time as well. We will discuss this further at her follow-up appointment.

## 2015-09-28 ENCOUNTER — Telehealth: Payer: Self-pay | Admitting: Pulmonary Disease

## 2015-09-28 MED ORDER — FLUTICASONE PROPIONATE 50 MCG/ACT NA SUSP: 2.0000 | Freq: Two times a day (BID) | NASAL | Status: DC

## 2015-09-28 MED ORDER — AEROCHAMBER MV MISC: Status: AC

## 2015-09-28 MED ORDER — BUDESONIDE-FORMOTEROL FUMARATE 160-4.5 MCG/ACT IN AERO: 2.0000 | INHALATION_SPRAY | Freq: Two times a day (BID) | RESPIRATORY_TRACT | Status: DC

## 2015-09-28 NOTE — Telephone Encounter (Signed)
Spoke with pt. She is aware of JN's recommendations. Rxs have been sent in. Nothing further was needed.

## 2015-09-28 NOTE — Telephone Encounter (Signed)
Spoke with the pt  She is calling back requesting refill for cheratussin  She reports that her cough is not improving since visit here on 09/14/15  Cough is prod with clear sputum and is esp worse at night  I encouraged her to continue using flutter valve  She is still not smoking  Looks like Dr Lenna Gilford gave her cheratussin #120 07/20/15  Can she have refill? Please advise thanks! Allergies  Allergen Reactions  . Nsaids Other (See Comments)    REACTION: Gets "Black and Blue" - easy bruising  . Tramadol Hcl Other (See Comments)    "Black and blue"  . Chantix [Varenicline]     Nausea, Dysgeusia  . Metronidazole Other (See Comments)    "caused dots to appear on legs"

## 2015-09-28 NOTE — Telephone Encounter (Signed)
Spoke with pt. She is aware of results. Nothing further was needed.  

## 2015-09-28 NOTE — Telephone Encounter (Signed)
Let's have her start on Symbicort 160/4.5 - 2inh twice daily with a spacer - 1 inhaler w/ 3 refills. Also patient should use nasal saline rinse twice daily along with Flonase nasal spray in each nostril twice daily. If this does not help her cough in the next few days then she should call back. Thanks.

## 2015-10-06 ENCOUNTER — Ambulatory Visit: Payer: Medicare Other | Attending: Anesthesiology | Admitting: Anesthesiology

## 2015-10-06 ENCOUNTER — Encounter: Payer: Self-pay | Admitting: Anesthesiology

## 2015-10-06 VITALS — BP 111/60 | HR 83 | Temp 98.3°F | Resp 16 | Ht 64.0 in | Wt 109.0 lb

## 2015-10-06 DIAGNOSIS — M5440 Lumbago with sciatica, unspecified side: Secondary | ICD-10-CM

## 2015-10-06 DIAGNOSIS — G8929 Other chronic pain: Secondary | ICD-10-CM | POA: Diagnosis not present

## 2015-10-06 DIAGNOSIS — M5116 Intervertebral disc disorders with radiculopathy, lumbar region: Secondary | ICD-10-CM | POA: Insufficient documentation

## 2015-10-06 DIAGNOSIS — R52 Pain, unspecified: Secondary | ICD-10-CM | POA: Diagnosis present

## 2015-10-06 DIAGNOSIS — M5136 Other intervertebral disc degeneration, lumbar region: Secondary | ICD-10-CM | POA: Diagnosis not present

## 2015-10-06 DIAGNOSIS — Z981 Arthrodesis status: Secondary | ICD-10-CM | POA: Diagnosis not present

## 2015-10-06 DIAGNOSIS — M545 Low back pain: Secondary | ICD-10-CM | POA: Diagnosis not present

## 2015-10-06 MED ORDER — TIZANIDINE HCL 4 MG PO TABS: 4.0000 mg | ORAL_TABLET | Freq: Three times a day (TID) | ORAL | Status: DC | PRN

## 2015-10-06 MED ORDER — HYDROCODONE-ACETAMINOPHEN 10-325 MG PO TABS: 1.0000 | ORAL_TABLET | Freq: Two times a day (BID) | ORAL | Status: DC

## 2015-10-06 NOTE — Patient Instructions (Signed)
GENERAL RISKS AND COMPLICATIONS  What are the risk, side effects and possible complications? Generally speaking, most procedures are safe.  However, with any procedure there are risks, side effects, and the possibility of complications.  The risks and complications are dependent upon the sites that are lesioned, or the type of nerve block to be performed.  The closer the procedure is to the spine, the more serious the risks are.  Great care is taken when placing the radio frequency needles, block needles or lesioning probes, but sometimes complications can occur. 1. Infection: Any time there is an injection through the skin, there is a risk of infection.  This is why sterile conditions are used for these blocks.  There are four possible types of infection. 1. Localized skin infection. 2. Central Nervous System Infection-This can be in the form of Meningitis, which can be deadly. 3. Epidural Infections-This can be in the form of an epidural abscess, which can cause pressure inside of the spine, causing compression of the spinal cord with subsequent paralysis. This would require an emergency surgery to decompress, and there are no guarantees that the patient would recover from the paralysis. 4. Discitis-This is an infection of the intervertebral discs.  It occurs in about 1% of discography procedures.  It is difficult to treat and it may lead to surgery.        2. Pain: the needles have to go through skin and soft tissues, will cause soreness.       3. Damage to internal structures:  The nerves to be lesioned may be near blood vessels or    other nerves which can be potentially damaged.       4. Bleeding: Bleeding is more common if the patient is taking blood thinners such as  aspirin, Coumadin, Ticiid, Plavix, etc., or if he/she have some genetic predisposition  such as hemophilia. Bleeding into the spinal canal can cause compression of the spinal  cord with subsequent paralysis.  This would require an  emergency surgery to  decompress and there are no guarantees that the patient would recover from the  paralysis.       5. Pneumothorax:  Puncturing of a lung is a possibility, every time a needle is introduced in  the area of the chest or upper back.  Pneumothorax refers to free air around the  collapsed lung(s), inside of the thoracic cavity (chest cavity).  Another two possible  complications related to a similar event would include: Hemothorax and Chylothorax.   These are variations of the Pneumothorax, where instead of air around the collapsed  lung(s), you may have blood or chyle, respectively.       6. Spinal headaches: They may occur with any procedures in the area of the spine.       7. Persistent CSF (Cerebro-Spinal Fluid) leakage: This is a rare problem, but may occur  with prolonged intrathecal or epidural catheters either due to the formation of a fistulous  track or a dural tear.       8. Nerve damage: By working so close to the spinal cord, there is always a possibility of  nerve damage, which could be as serious as a permanent spinal cord injury with  paralysis.       9. Death:  Although rare, severe deadly allergic reactions known as "Anaphylactic  reaction" can occur to any of the medications used.      10. Worsening of the symptoms:  We can always make thing worse.    What are the chances of something like this happening? Chances of any of this occuring are extremely low.  By statistics, you have more of a chance of getting killed in a motor vehicle accident: while driving to the hospital than any of the above occurring .  Nevertheless, you should be aware that they are possibilities.  In general, it is similar to taking a shower.  Everybody knows that you can slip, hit your head and get killed.  Does that mean that you should not shower again?  Nevertheless always keep in mind that statistics do not mean anything if you happen to be on the wrong side of them.  Even if a procedure has a 1  (one) in a 1,000,000 (million) chance of going wrong, it you happen to be that one..Also, keep in mind that by statistics, you have more of a chance of having something go wrong when taking medications.  Who should not have this procedure? If you are on a blood thinning medication (e.g. Coumadin, Plavix, see list of "Blood Thinners"), or if you have an active infection going on, you should not have the procedure.  If you are taking any blood thinners, please inform your physician.  How should I prepare for this procedure?  Do not eat or drink anything at least six hours prior to the procedure.  Bring a driver with you .  It cannot be a taxi.  Come accompanied by an adult that can drive you back, and that is strong enough to help you if your legs get weak or numb from the local anesthetic.  Take all of your medicines the morning of the procedure with just enough water to swallow them.  If you have diabetes, make sure that you are scheduled to have your procedure done first thing in the morning, whenever possible.  If you have diabetes, take only half of your insulin dose and notify our nurse that you have done so as soon as you arrive at the clinic.  If you are diabetic, but only take blood sugar pills (oral hypoglycemic), then do not take them on the morning of your procedure.  You may take them after you have had the procedure.  Do not take aspirin or any aspirin-containing medications, at least eleven (11) days prior to the procedure.  They may prolong bleeding.  Wear loose fitting clothing that may be easy to take off and that you would not mind if it got stained with Betadine or blood.  Do not wear any jewelry or perfume  Remove any nail coloring.  It will interfere with some of our monitoring equipment.  NOTE: Remember that this is not meant to be interpreted as a complete list of all possible complications.  Unforeseen problems may occur.  BLOOD THINNERS The following drugs  contain aspirin or other products, which can cause increased bleeding during surgery and should not be taken for 2 weeks prior to and 1 week after surgery.  If you should need take something for relief of minor pain, you may take acetaminophen which is found in Tylenol,m Datril, Anacin-3 and Panadol. It is not blood thinner. The products listed below are.  Do not take any of the products listed below in addition to any listed on your instruction sheet.  A.P.C or A.P.C with Codeine Codeine Phosphate Capsules #3 Ibuprofen Ridaura  ABC compound Congesprin Imuran rimadil  Advil Cope Indocin Robaxisal  Alka-Seltzer Effervescent Pain Reliever and Antacid Coricidin or Coricidin-D  Indomethacin Rufen    Alka-Seltzer plus Cold Medicine Cosprin Ketoprofen S-A-C Tablets  Anacin Analgesic Tablets or Capsules Coumadin Korlgesic Salflex  Anacin Extra Strength Analgesic tablets or capsules CP-2 Tablets Lanoril Salicylate  Anaprox Cuprimine Capsules Levenox Salocol  Anexsia-D Dalteparin Magan Salsalate  Anodynos Darvon compound Magnesium Salicylate Sine-off  Ansaid Dasin Capsules Magsal Sodium Salicylate  Anturane Depen Capsules Marnal Soma  APF Arthritis pain formula Dewitt's Pills Measurin Stanback  Argesic Dia-Gesic Meclofenamic Sulfinpyrazone  Arthritis Bayer Timed Release Aspirin Diclofenac Meclomen Sulindac  Arthritis pain formula Anacin Dicumarol Medipren Supac  Analgesic (Safety coated) Arthralgen Diffunasal Mefanamic Suprofen  Arthritis Strength Bufferin Dihydrocodeine Mepro Compound Suprol  Arthropan liquid Dopirydamole Methcarbomol with Aspirin Synalgos  ASA tablets/Enseals Disalcid Micrainin Tagament  Ascriptin Doan's Midol Talwin  Ascriptin A/D Dolene Mobidin Tanderil  Ascriptin Extra Strength Dolobid Moblgesic Ticlid  Ascriptin with Codeine Doloprin or Doloprin with Codeine Momentum Tolectin  Asperbuf Duoprin Mono-gesic Trendar  Aspergum Duradyne Motrin or Motrin IB Triminicin  Aspirin  plain, buffered or enteric coated Durasal Myochrisine Trigesic  Aspirin Suppositories Easprin Nalfon Trillsate  Aspirin with Codeine Ecotrin Regular or Extra Strength Naprosyn Uracel  Atromid-S Efficin Naproxen Ursinus  Auranofin Capsules Elmiron Neocylate Vanquish  Axotal Emagrin Norgesic Verin  Azathioprine Empirin or Empirin with Codeine Normiflo Vitamin E  Azolid Emprazil Nuprin Voltaren  Bayer Aspirin plain, buffered or children's or timed BC Tablets or powders Encaprin Orgaran Warfarin Sodium  Buff-a-Comp Enoxaparin Orudis Zorpin  Buff-a-Comp with Codeine Equegesic Os-Cal-Gesic   Buffaprin Excedrin plain, buffered or Extra Strength Oxalid   Bufferin Arthritis Strength Feldene Oxphenbutazone   Bufferin plain or Extra Strength Feldene Capsules Oxycodone with Aspirin   Bufferin with Codeine Fenoprofen Fenoprofen Pabalate or Pabalate-SF   Buffets II Flogesic Panagesic   Buffinol plain or Extra Strength Florinal or Florinal with Codeine Panwarfarin   Buf-Tabs Flurbiprofen Penicillamine   Butalbital Compound Four-way cold tablets Penicillin   Butazolidin Fragmin Pepto-Bismol   Carbenicillin Geminisyn Percodan   Carna Arthritis Reliever Geopen Persantine   Carprofen Gold's salt Persistin   Chloramphenicol Goody's Phenylbutazone   Chloromycetin Haltrain Piroxlcam   Clmetidine heparin Plaquenil   Cllnoril Hyco-pap Ponstel   Clofibrate Hydroxy chloroquine Propoxyphen         Before stopping any of these medications, be sure to consult the physician who ordered them.  Some, such as Coumadin (Warfarin) are ordered to prevent or treat serious conditions such as "deep thrombosis", "pumonary embolisms", and other heart problems.  The amount of time that you may need off of the medication may also vary with the medication and the reason for which you were taking it.  If you are taking any of these medications, please make sure you notify your pain physician before you undergo any  procedures.         Epidural Steroid Injection Patient Information  Description: The epidural space surrounds the nerves as they exit the spinal cord.  In some patients, the nerves can be compressed and inflamed by a bulging disc or a tight spinal canal (spinal stenosis).  By injecting steroids into the epidural space, we can bring irritated nerves into direct contact with a potentially helpful medication.  These steroids act directly on the irritated nerves and can reduce swelling and inflammation which often leads to decreased pain.  Epidural steroids may be injected anywhere along the spine and from the neck to the low back depending upon the location of your pain.   After numbing the skin with local anesthetic (like Novocaine), a small needle is passed   into the epidural space slowly.  You may experience a sensation of pressure while this is being done.  The entire block usually last less than 10 minutes.  Conditions which may be treated by epidural steroids:   Low back and leg pain  Neck and arm pain  Spinal stenosis  Post-laminectomy syndrome  Herpes zoster (shingles) pain  Pain from compression fractures  Preparation for the injection:  1. Do not eat any solid food or dairy products within 8 hours of your appointment.  2. You may drink clear liquids up to 3 hours before appointment.  Clear liquids include water, black coffee, juice or soda.  No milk or cream please. 3. You may take your regular medication, including pain medications, with a sip of water before your appointment  Diabetics should hold regular insulin (if taken separately) and take 1/2 normal NPH dos the morning of the procedure.  Carry some sugar containing items with you to your appointment. 4. A driver must accompany you and be prepared to drive you home after your procedure.  5. Bring all your current medications with your. 6. An IV may be inserted and sedation may be given at the discretion of the  physician.   7. A blood pressure cuff, EKG and other monitors will often be applied during the procedure.  Some patients may need to have extra oxygen administered for a short period. 8. You will be asked to provide medical information, including your allergies, prior to the procedure.  We must know immediately if you are taking blood thinners (like Coumadin/Warfarin)  Or if you are allergic to IV iodine contrast (dye). We must know if you could possible be pregnant.  Possible side-effects:  Bleeding from needle site  Infection (rare, may require surgery)  Nerve injury (rare)  Numbness & tingling (temporary)  Difficulty urinating (rare, temporary)  Spinal headache ( a headache worse with upright posture)  Light -headedness (temporary)  Pain at injection site (several days)  Decreased blood pressure (temporary)  Weakness in arm/leg (temporary)  Pressure sensation in back/neck (temporary)  Call if you experience:  Fever/chills associated with headache or increased back/neck pain.  Headache worsened by an upright position.  New onset weakness or numbness of an extremity below the injection site  Hives or difficulty breathing (go to the emergency room)  Inflammation or drainage at the infection site  Severe back/neck pain  Any new symptoms which are concerning to you  Please note:  Although the local anesthetic injected can often make your back or neck feel good for several hours after the injection, the pain will likely return.  It takes 3-7 days for steroids to work in the epidural space.  You may not notice any pain relief for at least that one week.  If effective, we will often do a series of three injections spaced 3-6 weeks apart to maximally decrease your pain.  After the initial series, we generally will wait several months before considering a repeat injection of the same type.  If you have any questions, please call [phone] [facility] 

## 2015-10-06 NOTE — Progress Notes (Signed)
Safety precautions to be maintained throughout the outpatient stay will include: orient to surroundings, keep bed in low position, maintain call bell within reach at all times, provide assistance with transfer out of bed and ambulation.  

## 2015-10-10 ENCOUNTER — Ambulatory Visit: Payer: Medicare Other | Admitting: Pain Medicine

## 2015-10-10 ENCOUNTER — Encounter: Payer: Self-pay | Admitting: Anesthesiology

## 2015-10-10 NOTE — Progress Notes (Signed)
   Subjective:    Patient ID: Paula Duncan, female    DOB: 1961/04/03, 55 y.o.   MRN: [id number]  HPI  This patient return to the clinic today indicating that she is doing much better Her pain is much better controlled Objective pain intensity rating is 40% She indicates that the medications are given her pain relief Havepain is now confined to the midline in the L3 L4-L5 area  Review of Systems  Constitutional: Negative.   HENT: Negative.   Eyes: Negative.   Respiratory: Negative.   Cardiovascular: Negative.   Gastrointestinal: Negative.   Endocrine: Negative.   Genitourinary: Negative.   Musculoskeletal: Positive for myalgias, back pain and arthralgias. Negative for joint swelling, gait problem, neck pain and neck stiffness.  Skin: Negative.   Allergic/Immunologic: Negative.   Neurological: Negative.   Hematological: Negative.   Psychiatric/Behavioral: Negative.        Objective:   Physical Exam  Cardiovascular:  Patient is in no distress Her vital signs are stable There are no new neurological or musculo-skeletal findings    Nursing note and vitals reviewed.         Assessment & Plan:   Assessment 1 chronic low back pain 2 lumbar degenerative disc disease 3 lumbar radiculopathy    Plan of management 1  For caudal epidural steroid injection at the next visit  2 for Zanaflex  Mg  Times a day  # 63 tablets  3 for loss that 10/325 very 12 hours  3 42 tablets  4 to come back in 3 weeks     Established patient        Level III     Lance Bosch M.D.

## 2015-10-11 ENCOUNTER — Ambulatory Visit: Payer: Medicare Other | Admitting: Anesthesiology

## 2015-10-12 ENCOUNTER — Encounter: Payer: Self-pay | Admitting: Pulmonary Disease

## 2015-10-12 ENCOUNTER — Telehealth: Payer: Self-pay | Admitting: Pulmonary Disease

## 2015-10-12 ENCOUNTER — Ambulatory Visit (INDEPENDENT_AMBULATORY_CARE_PROVIDER_SITE_OTHER): Payer: Medicare Other | Admitting: Pulmonary Disease

## 2015-10-12 ENCOUNTER — Other Ambulatory Visit: Payer: Self-pay | Admitting: Family Medicine

## 2015-10-12 VITALS — BP 118/64 | HR 79 | Ht 64.0 in | Wt 108.6 lb

## 2015-10-12 DIAGNOSIS — R05 Cough: Secondary | ICD-10-CM

## 2015-10-12 DIAGNOSIS — J84115 Respiratory bronchiolitis interstitial lung disease: Secondary | ICD-10-CM

## 2015-10-12 DIAGNOSIS — R059 Cough, unspecified: Secondary | ICD-10-CM

## 2015-10-12 MED ORDER — BENZONATATE 100 MG PO CAPS: 100.0000 mg | ORAL_CAPSULE | Freq: Three times a day (TID) | ORAL | Status: DC | PRN

## 2015-10-12 NOTE — Patient Instructions (Signed)
   Call me if your cough worsens before your next appointment or you have any new breathing problems before then.  Call back when you have the name & location of the stomach specialist you saw that did your test.  I will see you back in 4-6 months with a breathing test at that time.  TESTS ORDERED: 1. Spirometry with bronchodilator challenge & DLCO at next appointment 2. 6 minute walk test on room air at next appointment
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
## 2015-10-12 NOTE — Telephone Encounter (Signed)
IMAGING HRCT CHEST W/O 09/22/15 (personally reviewed by me): Mild air trapping. No pathologic mediastinal adenopathy. No pericardial effusion, pleural effusion, or pleural thickening. Diffuse centrilobular groundglass attenuation & micro-nodularities consistent with respiratory bronchiolitis.

## 2015-10-12 NOTE — Addendum Note (Signed)
Addended by: Raymondo Band D on: 10/12/2015 10:29 AM   Modules accepted: Orders

## 2015-10-12 NOTE — Addendum Note (Signed)
Addended by: Raymondo Band D on: 10/12/2015 10:26 AM   Modules accepted: Orders

## 2015-10-14 ENCOUNTER — Other Ambulatory Visit: Payer: Self-pay | Admitting: Family Medicine

## 2015-10-26 ENCOUNTER — Telehealth: Payer: Self-pay | Admitting: Pulmonary Disease

## 2015-10-26 NOTE — Telephone Encounter (Signed)
Ok Cheratussin 200 ml, 5 ml every 6 hours if needed. No ref

## 2015-10-26 NOTE — Telephone Encounter (Signed)
LMTCB

## 2015-10-26 NOTE — Telephone Encounter (Signed)
Patient states that she has a bad cough, she said that she took 2 tessalon peerles last night and 2 again this morning but it is not helping, she is coughing so much her lungs/ribs hurt.  She said she is not getting anything out, it is a dry hacking cough.  She took Cheratussin once before, and it works really well.  She would like a prescription for Cheratussin.  [facility]  Allergies  Allergen Reactions  . Nsaids Other (See Comments)    REACTION: Gets "Black and Blue" - easy bruising  . Tramadol Hcl Other (See Comments)    "Black and blue"  . Chantix [Varenicline]     Nausea, Dysgeusia  . Metronidazole Other (See Comments)    "caused dots to appear on legs"

## 2015-10-27 ENCOUNTER — Encounter: Payer: Self-pay | Admitting: Anesthesiology

## 2015-10-27 ENCOUNTER — Ambulatory Visit: Payer: Medicare Other | Attending: Anesthesiology | Admitting: Anesthesiology

## 2015-10-27 VITALS — BP 122/67 | HR 92 | Temp 98.3°F | Resp 16 | Ht 64.0 in | Wt 110.0 lb

## 2015-10-27 DIAGNOSIS — M5440 Lumbago with sciatica, unspecified side: Secondary | ICD-10-CM | POA: Insufficient documentation

## 2015-10-27 DIAGNOSIS — M5136 Other intervertebral disc degeneration, lumbar region: Secondary | ICD-10-CM | POA: Diagnosis not present

## 2015-10-27 DIAGNOSIS — M5116 Intervertebral disc disorders with radiculopathy, lumbar region: Secondary | ICD-10-CM | POA: Diagnosis not present

## 2015-10-27 DIAGNOSIS — M545 Low back pain: Secondary | ICD-10-CM | POA: Diagnosis not present

## 2015-10-27 DIAGNOSIS — G8929 Other chronic pain: Secondary | ICD-10-CM

## 2015-10-27 DIAGNOSIS — Z981 Arthrodesis status: Secondary | ICD-10-CM | POA: Diagnosis not present

## 2015-10-27 MED ORDER — GUAIFENESIN-CODEINE 100-10 MG/5ML PO SYRP: 5.0000 mL | ORAL_SOLUTION | Freq: Four times a day (QID) | ORAL | Status: DC | PRN

## 2015-10-27 MED ORDER — MIDAZOLAM HCL 5 MG/5ML IJ SOLN: 1.0000 mg | INTRAMUSCULAR | Status: DC

## 2015-10-27 MED ORDER — BUPIVACAINE HCL (PF) 0.25 % IJ SOLN
INTRAMUSCULAR | Status: AC
  Administered 2015-10-27: 15:00:00
  Filled 2015-10-27: qty 30

## 2015-10-27 MED ORDER — FENTANYL CITRATE (PF) 100 MCG/2ML IJ SOLN
INTRAMUSCULAR | Status: AC
  Filled 2015-10-27: qty 2

## 2015-10-27 MED ORDER — TRIAMCINOLONE ACETONIDE 40 MG/ML IJ SUSP: 80.0000 mg | Freq: Once | INTRAMUSCULAR | Status: DC

## 2015-10-27 MED ORDER — FENTANYL CITRATE (PF) 100 MCG/2ML IJ SOLN: 50.0000 ug | INTRAMUSCULAR | Status: DC

## 2015-10-27 MED ORDER — TRIAMCINOLONE ACETONIDE 40 MG/ML IJ SUSP
INTRAMUSCULAR | Status: AC
  Administered 2015-10-27: 80 mg
  Filled 2015-10-27: qty 2

## 2015-10-27 MED ORDER — BUPIVACAINE HCL (PF) 0.25 % IJ SOLN: 20.0000 mL | Freq: Once | INTRAMUSCULAR | Status: DC

## 2015-10-27 MED ORDER — IOPAMIDOL (ISOVUE-M 200) INJECTION 41%
INTRAMUSCULAR | Status: AC
  Administered 2015-10-27: 15:00:00
  Filled 2015-10-27: qty 10

## 2015-10-27 MED ORDER — HYDROCODONE-ACETAMINOPHEN 10-325 MG PO TABS: 1.0000 | ORAL_TABLET | Freq: Two times a day (BID) | ORAL | Status: DC

## 2015-10-27 MED ORDER — MIDAZOLAM HCL 5 MG/5ML IJ SOLN
INTRAMUSCULAR | Status: AC
  Filled 2015-10-27: qty 5

## 2015-10-27 NOTE — Procedures (Signed)
Date of procedure:  10/27/2015  Preoperative Diagnosis:  1 chronic low back pain 2 lumbar degenerative disc disease Lumbar radiculopathy  Postoperative Diagnosis:  Same.  Procedure: 1. Caudal epidural steroid injection, 2. Epidural with interpretation. 3. Fluoroscopic guidance.  Surgeon: Lance Bosch, MD  Anesthesia: MAC anesthesia by the nurse and staff under my direction.  Informed consent was obtained and the patient appeared to accept and understand the benefits and risks of this procedure.   Pre procedure comments:  None  Description of the Procedure:  The patient was taken to the operating room and placed in the prone position.  Intravenous sedation and MAC anesthesia was administered by the nurse and staff under my direction. After appropriate sedation, the sacrococcygeal area was prepped with Betadine.  After adequate draping, the area between the sacral cornu was palpated and infiltrated with 3 cc of 1% Lidocaine.   An AP fluoroscopic view of the sacrum was visualized and a 17 gauge Tuohy needle was inserted in the midline at the angle of 45 degrees through the sacrococcygeal membrane.  After making contact with the bone, the needle was withdrawn and readvanced in horizontal position, into the caudal epidural space.  Epidurogram Study: One cc of Omnipaque 300 was injected through the needle and epidurogram was visualized in both the later and AP views. 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  The needle was removed and adequate hemostasis was established.    The patient tolerated the procedure quite well and vital signs were stable.  There were no adverse effects.  Additional comments:    The patient was taken to the recovery room in satisfactory condition where the patient was observed and subsequently discharged home.  Will follow up in the clinic in the next week.   Lance Bosch M.D.

## 2015-10-27 NOTE — Telephone Encounter (Signed)
Called spoke with pt. Informed her of CY's recs and that the rx will be at the front and ready for pick up this morning. She voiced understanding and had no further questions. Rx has been signed and placed at the front office. Nothing further needed.

## 2015-10-27 NOTE — Progress Notes (Signed)
Safety precautions to be maintained throughout the outpatient stay will include: orient to surroundings, keep bed in low position, maintain call bell within reach at all times, provide assistance with transfer out of bed and ambulation.  

## 2015-10-27 NOTE — Patient Instructions (Signed)
Epidural Steroid Injection An epidural steroid injection is given to relieve pain in your neck, back, or legs that is caused by the irritation or swelling of a nerve root. This procedure involves injecting a steroid and numbing medicine (anesthetic) into the epidural space. The epidural space is the space between the outer covering of your spinal cord and the bones that form your backbone (vertebra).  LET YOUR HEALTH CARE PROVIDER KNOW ABOUT:   Any allergies you have.  All medicines you are taking, including vitamins, herbs, eye drops, creams, and over-the-counter medicines such as aspirin.  Previous problems you or members of your family have had with the use of anesthetics.  Any blood disorders or blood clotting disorders you have.  Previous surgeries you have had.  Medical conditions you have. RISKS AND COMPLICATIONS Generally, this is a safe procedure. However, as with any procedure, complications can occur. Possible complications of epidural steroid injection include:  Headache.  Bleeding.  Infection.  Allergic reaction to the medicines.  Damage to your nerves. The response to this procedure depends on the underlying cause of the pain and its duration. People who have long-term (chronic) pain are less likely to benefit from epidural steroids than are those people whose pain comes on strong and suddenly. BEFORE THE PROCEDURE   Ask your health care provider about changing or stopping your regular medicines. You may be advised to stop taking blood-thinning medicines a few days before the procedure.  You may be given medicines to reduce anxiety.  Arrange for someone to take you home after the procedure. PROCEDURE   You will remain awake during the procedure. You may receive medicine to make you relaxed.  You will be asked to lie on your stomach.  The injection site will be cleaned.  The injection site will be numbed with a medicine (local anesthetic).  A needle will be  injected through your skin into the epidural space.  Your health care provider will use an X-ray machine to ensure that the steroid is delivered closest to the affected nerve. You may have minimal discomfort at this time.  Once the needle is in the right position, the local anesthetic and the steroid will be injected into the epidural space.  The needle will then be removed and a bandage will be applied to the injection site. AFTER THE PROCEDURE   You may be monitored for a short time before you go home.  You may feel weakness or numbness in your arm or leg, which disappears within hours.  You may be allowed to eat, drink, and take your regular medicine.  You may have soreness at the site of the injection.   This information is not intended to replace advice given to you by your health care provider. Make sure you discuss any questions you have with your health care provider.   Document Released: 07/30/2007 Document Revised: 12/23/2012 Document Reviewed: 10/09/2012 Elsevier Interactive Patient Education 2016 [employer] Pain Management Discharge Instructions  General Discharge Instructions :  If you need to reach your doctor call: Monday-Friday 8:00 am - 4:00 pm at [phone] or toll free [phone].  After clinic hours [phone] to have operator reach doctor.  Bring all of your medication bottles to all your appointments in the pain clinic.  To cancel or reschedule your appointment with Pain Management please remember to call 24 hours in advance to avoid a fee.  Refer to the educational materials which you have been given on: General Risks, I had my Procedure.   Discharge Instructions, Post Sedation.  Post Procedure Instructions:  The drugs you were given will stay in your system until tomorrow, so for the next 24 hours you should not drive, make any legal decisions or drink any alcoholic beverages.  You may eat anything you prefer, but it is better to start with  liquids then soups and crackers, and gradually work up to solid foods.  Please notify your doctor immediately if you have any unusual bleeding, trouble breathing or pain that is not related to your normal pain.  Depending on the type of procedure that was done, some parts of your body may feel week and/or numb.  This usually clears up by tonight or the next day.  Walk with the use of an assistive device or accompanied by an adult for the 24 hours.  You may use ice on the affected area for the first 24 hours.  Put ice in a Ziploc bag and cover with a towel and place against area 15 minutes on 15 minutes off.  You may switch to heat after 24 hours. 

## 2015-10-27 NOTE — Progress Notes (Signed)
   Subjective:    Patient ID: Paula Duncan, female    DOB: Sep 30, 1960, 55 y.o.   MRN: [id number]  HPI    Review of Systems     Objective:   Physical Exam        Assessment & Plan:   The patient was given him Lorcet 10/325 mg 1 tablet to be taken up to 2 times a day and will follow up with her in the next 2 weeks  Lance Bosch M.D.

## 2015-10-30 ENCOUNTER — Telehealth: Payer: Self-pay

## 2015-10-30 NOTE — Telephone Encounter (Signed)
Post procedure phone call.  Left message.  

## 2015-11-01 ENCOUNTER — Telehealth: Payer: Self-pay

## 2015-11-03 ENCOUNTER — Telehealth: Payer: Self-pay

## 2015-11-03 MED ORDER — TIZANIDINE HCL 4 MG PO TABS: 4.0000 mg | ORAL_TABLET | Freq: Three times a day (TID) | ORAL | Status: DC

## 2015-11-03 NOTE — Telephone Encounter (Signed)
Pt has been very rude blaming [place] as to why she did not get her medications on 6/23 when she was suppose to. I offered pt an appt to come in to see Dr. Idelia Salm so that she can get her scripts. Pt does not want the appt. She canceled the appt on the 7th saying she would be away and she did not want to RS. Please call pt back... please

## 2015-11-03 NOTE — Telephone Encounter (Signed)
Spoke with Dr. Lattie Haw e-scribe Tizanidine. Patient notified.

## 2015-11-10 ENCOUNTER — Ambulatory Visit: Payer: Medicare Other | Admitting: Anesthesiology

## 2015-11-16 DIAGNOSIS — G8929 Other chronic pain: Secondary | ICD-10-CM | POA: Diagnosis not present

## 2015-11-16 DIAGNOSIS — M545 Low back pain: Secondary | ICD-10-CM | POA: Diagnosis not present

## 2015-11-16 DIAGNOSIS — M5417 Radiculopathy, lumbosacral region: Secondary | ICD-10-CM | POA: Diagnosis not present

## 2015-11-23 ENCOUNTER — Telehealth: Payer: Self-pay | Admitting: General Practice

## 2015-11-23 ENCOUNTER — Telehealth: Payer: Self-pay

## 2015-11-23 NOTE — Telephone Encounter (Signed)
Pt wants medication I tried to explain to pt Dr Idelia Salm is no longer here until further notice

## 2015-11-23 NOTE — Telephone Encounter (Signed)
Pt transferred care and now out of state. She will be back after December 11, 2015. She still needs to find a PCP and was hoping to get some refills on her muscle relaxer, If you can send it to the pharmacy on file since they deliver. I told her that since she is no longer our patient I didn't think that the doctor would be able to do this, but I would send the message. jw

## 2015-11-24 ENCOUNTER — Telehealth: Payer: Self-pay | Admitting: *Deleted

## 2015-11-24 NOTE — Telephone Encounter (Signed)
Routed to Dr Dossie Arbour to see if he would take this patient on per Vicki's recommendation.

## 2015-11-27 NOTE — Telephone Encounter (Signed)
Pt called again wanting Dr. Cyndia Skeeters to call in zanaflex or vicodin. She wants it called into [facility] and for someone to call her. [facility] will not refill her medication. ep

## 2015-11-28 NOTE — Telephone Encounter (Signed)
Pt calling again about refill. Please advise. Deseree Kennon Holter, CMA

## 2015-11-28 NOTE — Telephone Encounter (Signed)
Unfortunately, I can't fill her pain medications. She has pain contract with pain clinic. They handle her pain meds. I handle the rest. Thank you! Bretta Bang

## 2015-11-28 NOTE — Telephone Encounter (Signed)
Set her up as a new patient visit. Described then sending me electronically medications, come in until me personally so that this can be done quicker.

## 2015-11-29 NOTE — Telephone Encounter (Signed)
Please see the phone documentation from 11/24/15 from another office, looks like maybe pt is being set up with another doctor. Katharina Caper, Giovana Faciane D, [place]

## 2015-11-29 NOTE — Telephone Encounter (Signed)
Please schedule as new patient with Dr Dossie Arbour

## 2015-11-29 NOTE — Telephone Encounter (Signed)
LVM for pt to call office to inform her of the below message. Katharina Caper, April D, [place]

## 2015-11-29 NOTE — Telephone Encounter (Signed)
Pt called back and I informed her of below and she wanted the Dr to know that she is in [place] and she was going to Dr. Lance Bosch who just quit and the office released her from her pain contract because they do not cover other doctors patients at this office.  She is without her medicine and said that if we could fill it this time (once) she could get into another clinic as soon as she gets back in town. Informed her that this would probably not be able to be done but that I would send it to the doctor to be advised. Katharina Caper, April D, [place]

## 2015-11-29 NOTE — Telephone Encounter (Signed)
Routed to Pm-admin to set up as new patient.

## 2015-11-30 NOTE — Telephone Encounter (Signed)
Paula Duncan please schedule patient as New Patient with Dr Dossie Arbour

## 2015-11-30 NOTE — Telephone Encounter (Signed)
Patient calling again for medication stating that pain medicine has discharge her since her Dr retired. Informed patient that she has not been discharged and it appeasr as though she will be scheduled with a new provider at pain management (see previous phone notes). Patient then states that she still wont be able to get in to see them for a month and needs Dr. Cyndia Duncan to prescribe until then.

## 2015-12-12 DIAGNOSIS — Z79891 Long term (current) use of opiate analgesic: Secondary | ICD-10-CM | POA: Insufficient documentation

## 2015-12-12 DIAGNOSIS — Z5181 Encounter for therapeutic drug level monitoring: Secondary | ICD-10-CM | POA: Insufficient documentation

## 2015-12-12 DIAGNOSIS — F1190 Opioid use, unspecified, uncomplicated: Secondary | ICD-10-CM | POA: Insufficient documentation

## 2015-12-12 NOTE — Progress Notes (Signed)
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 Chronic pain syndrome 07/20/2015  . 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 Chronic pain 12/12/2015  . Long term current use of opiate analgesic 12/12/2015  . Long term prescription opiate use 12/12/2015  . Opiate use 12/12/2015  . Encounter for therapeutic drug level monitoring 12/12/2015  . History of lumbar fusion 12/13/2015  . Chronic hip pain (Location of Secondary source of pain) (Bilateral) (R>L) 12/13/2015  . Disturbance of skin sensation 12/13/2015  . Neurogenic pain 12/13/2015  . Musculoskeletal pain 12/13/2015  . Muscle cramps 12/13/2015  . Insomnia secondary to chronic pain 12/13/2015  . Chronic low back pain (Location of Primary Source of Pain) (Bilateral) 12/13/2015  . Lumbar postlaminectomy syndrome 12/13/2015   Resolved Ambulatory Problems    Diagnosis Date Noted  . TOBACCO DEPENDENCE 07/03/2006  . Chronic rhinitis 05/03/2009  . GASTROESOPHAGEAL REFLUX, NO ESOPHAGITIS  07/03/2006  . Anorexia 05/23/2009  . Amnesia with drop attacks 08/21/2010  . COPD with acute bronchitis ([place]) 02/26/2011  . COPD (chronic obstructive pulmonary disease) ([place]) 03/27/2011  . Attention to dressings and sutures 03/27/2011  . Headache(784.0) 02/18/2012  . Medication refill 06/23/2012  . Mass in the abdomen 07/22/2012  . Abdominal pain, other specified site 08/25/2012  . Edema 09/21/2012  . Myopia 02/01/2013  . Passive smoker 04/14/2013  . Acute paronychia 11/08/2013  . Subcutaneous nodule 01/04/2014  . Diarrhea 05/10/2014  . Acute sinusitis 05/10/2014  . Abdominal pain, epigastric 07/07/2014  . Generalized abdominal pain 07/15/2014  . Irritable bowel syndrome 12/06/2014  . Acute sinusitis 01/23/2015   Past Medical History:  Diagnosis Date  . Anorexia   . Chest pain   . Chronic back pain   . 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 COPD (chronic obstructive pulmonary disease) ([place])   . GERD (gastroesophageal reflux disease)   . H/O sleep apnea   . History of tobacco abuse   . Hx of migraines   . Hyperlipidemia   . Hypertension   . Melena   . PVC (premature ventricular contraction)     Constitutional Exam  Vitals: Blood pressure 113/63, pulse 86, temperature 98.3 F (36.8 C), temperature source Oral, resp. rate 16, height  5' 4" (1.626 m), weight 110 lb (49.9 kg), SpO2 98 %. General appearance: Well nourished, well developed, and well hydrated. In no acute distress Calculated BMI/Body habitus: Body mass index is 18.88 kg/m.       Psych/Mental status: Alert and oriented x 3 (person, place, & time) Eyes: PERLA Respiratory: No evidence of acute respiratory distress  Cervical Spine Exam  Inspection: No masses, redness, or swelling Alignment: Symmetrical Functional ROM: ROM appears unrestricted Stability: No instability detected Muscle strength & Tone: Functionally intact Sensory: Unimpaired Palpation: Non-contributory  Upper Extremity (UE)  Exam    Side: Right upper extremity  Side: Left upper extremity  Inspection: No masses, redness, swelling, or asymmetry  Inspection: No masses, redness, swelling, or asymmetry  Functional ROM: ROM appears unrestricted  Functional ROM: ROM appears unrestricted  Muscle strength & Tone: Functionally intact  Muscle strength & Tone: Functionally intact  Sensory: Unimpaired  Sensory: Unimpaired  Palpation: Non-contributory  Palpation: Non-contributory   Thoracic Spine Exam  Inspection: No masses, redness, or swelling Alignment: Symmetrical Functional ROM: ROM appears unrestricted Stability: No instability detected Sensory: Unimpaired Muscle strength & Tone: Functionally intact Palpation: Non-contributory  Lumbar Spine Exam  Inspection: Well healed scar from previous spine surgery detected Alignment: Symmetrical Functional ROM: Limited ROM Stability: No instability detected Muscle strength & Tone: Functionally intact Sensory: Movement-associated pain Palpation: Complains of area being tender to palpation Provocative Tests: Lumbar Hyperextension and rotation test: Positive bilaterally for facet joint pain. Patrick's Maneuver: Positive for right-sided S-I joint pain and for bilateral hip joint pain.  Gait & Posture Assessment  Ambulation: Unassisted Gait: Relatively normal for age and body habitus Posture: WNL   Lower Extremity Exam    Side: Right lower extremity  Side: Left lower extremity  Inspection: No masses, redness, swelling, or asymmetry  Inspection: No masses, redness, swelling, or asymmetry  Functional ROM: ROM appears unrestricted  Functional ROM: ROM appears unrestricted  Muscle strength & Tone: Able to Toe-walk & Heel-walk without problems  Muscle strength & Tone: Able to Toe-walk & Heel-walk without problems  Sensory: Unimpaired  Sensory: Unimpaired  Palpation: Non-contributory  Palpation: Non-contributory    Assessment  Primary Diagnosis & Pertinent Problem  List: The primary encounter diagnosis was Chronic pain. Diagnoses of Long term current use of opiate analgesic, Long term prescription opiate use, Opiate use, Encounter for therapeutic drug level monitoring, History of lumbar fusion, Chronic hip pain, unspecified laterality, Disturbance of skin sensation, INSOMNIA, CHRONIC, Neurogenic pain, Musculoskeletal pain, Muscle cramps, Insomnia secondary to chronic pain, Chronic low back pain (Location of Primary Source of Pain) (Bilateral), and Lumbar postlaminectomy syndrome were also pertinent to this visit.  Visit Diagnosis: 1. Chronic pain   2. Long term current use of opiate analgesic   3. Long term prescription opiate use   4. Opiate use   5. Encounter for therapeutic drug level monitoring   6. History of lumbar fusion   7. Chronic hip pain, unspecified laterality   8. Disturbance of skin sensation   9. INSOMNIA, CHRONIC   10. Neurogenic pain   11. Musculoskeletal pain   12. Muscle cramps   13. Insomnia secondary to chronic pain   14. Chronic low back pain (Location of Primary Source of Pain) (Bilateral)   15. Lumbar postlaminectomy syndrome     Assessment: No problem-specific Assessment & Plan notes found for this encounter.   Plan of Care  Initial Treatment Plan:  Please be advised that as per protocol, today's visit has been an evaluation only. We  have not taken over the patient's controlled substance management.  Problem List Items Addressed This Visit      High   Chronic hip pain (Location of Secondary source of pain) (Bilateral) (R>L) (Chronic)   Relevant Medications   HYDROcodone-acetaminophen (NORCO) 10-325 MG tablet   gabapentin (NEURONTIN) 100 MG capsule   traZODone (DESYREL) 100 MG tablet   orphenadrine (NORFLEX) 100 MG tablet   Other Relevant Orders   DG HIP UNILAT W OR W/O PELVIS 2-3 VIEWS LEFT   DG HIP UNILAT W OR W/O PELVIS 2-3 VIEWS RIGHT   Chronic low back pain (Location of Primary Source of Pain) (Bilateral)  (Chronic)   Relevant Medications   HYDROcodone-acetaminophen (NORCO) 10-325 MG tablet   orphenadrine (NORFLEX) 100 MG tablet   Chronic pain - Primary (Chronic)   Relevant Medications   HYDROcodone-acetaminophen (NORCO) 10-325 MG tablet   gabapentin (NEURONTIN) 100 MG capsule   traZODone (DESYREL) 100 MG tablet   orphenadrine (NORFLEX) 100 MG tablet   Other Relevant Orders   Comprehensive metabolic panel   C-reactive protein   Magnesium   Sedimentation rate   25-Hydroxyvitamin D Lcms D2+D3   Ambulatory referral to Psychology   History of lumbar fusion   Insomnia secondary to chronic pain (Chronic)   Relevant Medications   HYDROcodone-acetaminophen (NORCO) 10-325 MG tablet   gabapentin (NEURONTIN) 100 MG capsule   traZODone (DESYREL) 100 MG tablet   orphenadrine (NORFLEX) 100 MG tablet   Lumbar postlaminectomy syndrome (Chronic)   Muscle cramps (Chronic)   Relevant Medications   orphenadrine (NORFLEX) 100 MG tablet   Musculoskeletal pain (Chronic)   Relevant Medications   orphenadrine (NORFLEX) 100 MG tablet   Neurogenic pain (Chronic)   Relevant Medications   traZODone (DESYREL) 100 MG tablet     Medium   Encounter for therapeutic drug level monitoring   Long term current use of opiate analgesic (Chronic)   Relevant Orders   Compliance Drug Analysis, Ur   Ambulatory referral to Psychology   Long term prescription opiate use (Chronic)   Opiate use (Chronic)     Low   Chronic insomnia (Chronic)   Disturbance of skin sensation (Chronic)   Relevant Orders   Vitamin B12    Other Visit Diagnoses   None.     Pharmacotherapy (Medications Ordered): Meds ordered this encounter  Medications  . traZODone (DESYREL) 100 MG tablet    Sig: Take 1 tablet (100 mg total) by mouth at bedtime.    Dispense:  30 tablet    Refill:  0    Do not add this medication to the electronic "Automatic Refill" notification system. Patient may have prescription filled one day early if  pharmacy is closed on scheduled refill date.  . orphenadrine (NORFLEX) 100 MG tablet    Sig: Take 1 tablet (100 mg total) by mouth 2 (two) times daily as needed for muscle spasms.    Dispense:  60 tablet    Refill:  0    Do not add this medication to the electronic "Automatic Refill" notification system. Patient may have prescription filled one day early if pharmacy is closed on scheduled refill date.    Lab-work & Procedure Ordered: Orders Placed This Encounter  Procedures  . DG HIP UNILAT W OR W/O PELVIS 2-3 VIEWS LEFT  . DG HIP UNILAT W OR W/O PELVIS 2-3 VIEWS RIGHT  . Compliance Drug Analysis, Ur  . Comprehensive metabolic panel  . C-reactive protein  . Magnesium  . Sedimentation rate  .  Vitamin B12  . 25-Hydroxyvitamin D Lcms D2+D3  . Ambulatory referral to Psychology    Interventional Therapies: Scheduled:  None at this time.    Consider: None at this time.    PRN Procedures:  None at this time.    Referral(s) or Consult(s): Medical psychology consult for substance use disorder evaluation  Medications administered during this visit: Paula Duncan had no medications administered during this visit.  Prescriptions ordered during this visit: New Prescriptions   No medications on file    Requested PM Follow-up: Return for After [facility] Eval.  Future Appointments Date Time Provider [place]  02/20/2016 9:00 AM [facility] 6 MINUTE WALK [facility] None  02/20/2016 10:00 AM [facility] PFT ROOM [facility] None  02/20/2016 11:00 AM Javier Glazier, MD [employer] None     Primary Care Physician: No PCP Per Patient Location: [facility] Note by: Kathlen Brunswick. Dossie Arbour, M.D, DABA, DABAPM, DABPM, DABIPP, FIPP  Pain Score Disclaimer: We use the NRS-11 scale. This is a self-reported, subjective measurement of pain severity with only modest accuracy. It is used primarily to identify changes within a particular patient. It must  be understood that outpatient pain scales are significantly less accurate that those used for research, where they can be applied under ideal controlled circumstances with minimal exposure to variables. In reality, the score is likely to be a combination of pain intensity and pain affect, where pain affect describes the degree of emotional arousal or changes in action readiness caused by the sensory experience of pain. Factors such as social and work situation, setting, emotional state, anxiety levels, expectation, and prior pain experience may influence pain perception and show large inter-individual differences that may also be affected by time variables.  Patient instructions provided during this appointment: Patient Instructions  INSTRUCTED TO GET LABWORK AND XRAYS AT [facility].  DR Marjory Lies WILL CALL YOU FOR AN APPOINTMENT.  WHEN YOU HAVE DONE ALL OF THE ABOVE, CALL OFFICE AND MAKE AN APPOINTMENT.

## 2015-12-13 ENCOUNTER — Ambulatory Visit: Payer: Medicare Other | Attending: Pain Medicine | Admitting: Pain Medicine

## 2015-12-13 ENCOUNTER — Encounter: Payer: Self-pay | Admitting: Pain Medicine

## 2015-12-13 VITALS — BP 113/63 | HR 86 | Temp 98.3°F | Resp 16 | Ht 64.0 in | Wt 110.0 lb

## 2015-12-13 DIAGNOSIS — R252 Cramp and spasm: Secondary | ICD-10-CM | POA: Insufficient documentation

## 2015-12-13 DIAGNOSIS — R05 Cough: Secondary | ICD-10-CM | POA: Diagnosis not present

## 2015-12-13 DIAGNOSIS — M25552 Pain in left hip: Secondary | ICD-10-CM | POA: Insufficient documentation

## 2015-12-13 DIAGNOSIS — G4700 Insomnia, unspecified: Secondary | ICD-10-CM

## 2015-12-13 DIAGNOSIS — G8929 Other chronic pain: Secondary | ICD-10-CM | POA: Insufficient documentation

## 2015-12-13 DIAGNOSIS — F1190 Opioid use, unspecified, uncomplicated: Secondary | ICD-10-CM | POA: Diagnosis not present

## 2015-12-13 DIAGNOSIS — M7918 Myalgia, other site: Secondary | ICD-10-CM

## 2015-12-13 DIAGNOSIS — J84115 Respiratory bronchiolitis interstitial lung disease: Secondary | ICD-10-CM | POA: Diagnosis not present

## 2015-12-13 DIAGNOSIS — D123 Benign neoplasm of transverse colon: Secondary | ICD-10-CM | POA: Insufficient documentation

## 2015-12-13 DIAGNOSIS — R102 Pelvic and perineal pain: Secondary | ICD-10-CM | POA: Diagnosis not present

## 2015-12-13 DIAGNOSIS — R634 Abnormal weight loss: Secondary | ICD-10-CM | POA: Insufficient documentation

## 2015-12-13 DIAGNOSIS — M25551 Pain in right hip: Secondary | ICD-10-CM | POA: Insufficient documentation

## 2015-12-13 DIAGNOSIS — I493 Ventricular premature depolarization: Secondary | ICD-10-CM | POA: Diagnosis not present

## 2015-12-13 DIAGNOSIS — G43909 Migraine, unspecified, not intractable, without status migrainosus: Secondary | ICD-10-CM | POA: Insufficient documentation

## 2015-12-13 DIAGNOSIS — M4806 Spinal stenosis, lumbar region: Secondary | ICD-10-CM | POA: Insufficient documentation

## 2015-12-13 DIAGNOSIS — J849 Interstitial pulmonary disease, unspecified: Secondary | ICD-10-CM | POA: Diagnosis not present

## 2015-12-13 DIAGNOSIS — G4701 Insomnia due to medical condition: Secondary | ICD-10-CM | POA: Insufficient documentation

## 2015-12-13 DIAGNOSIS — J019 Acute sinusitis, unspecified: Secondary | ICD-10-CM | POA: Insufficient documentation

## 2015-12-13 DIAGNOSIS — M961 Postlaminectomy syndrome, not elsewhere classified: Secondary | ICD-10-CM | POA: Insufficient documentation

## 2015-12-13 DIAGNOSIS — M25559 Pain in unspecified hip: Secondary | ICD-10-CM

## 2015-12-13 DIAGNOSIS — Z79899 Other long term (current) drug therapy: Secondary | ICD-10-CM | POA: Diagnosis not present

## 2015-12-13 DIAGNOSIS — M5116 Intervertebral disc disorders with radiculopathy, lumbar region: Secondary | ICD-10-CM | POA: Insufficient documentation

## 2015-12-13 DIAGNOSIS — Z5181 Encounter for therapeutic drug level monitoring: Secondary | ICD-10-CM

## 2015-12-13 DIAGNOSIS — M792 Neuralgia and neuritis, unspecified: Secondary | ICD-10-CM | POA: Insufficient documentation

## 2015-12-13 DIAGNOSIS — F17210 Nicotine dependence, cigarettes, uncomplicated: Secondary | ICD-10-CM | POA: Insufficient documentation

## 2015-12-13 DIAGNOSIS — M791 Myalgia: Secondary | ICD-10-CM | POA: Diagnosis not present

## 2015-12-13 DIAGNOSIS — E785 Hyperlipidemia, unspecified: Secondary | ICD-10-CM | POA: Insufficient documentation

## 2015-12-13 DIAGNOSIS — E46 Unspecified protein-calorie malnutrition: Secondary | ICD-10-CM | POA: Insufficient documentation

## 2015-12-13 DIAGNOSIS — R200 Anesthesia of skin: Secondary | ICD-10-CM | POA: Insufficient documentation

## 2015-12-13 DIAGNOSIS — Z981 Arthrodesis status: Secondary | ICD-10-CM | POA: Diagnosis not present

## 2015-12-13 DIAGNOSIS — I10 Essential (primary) hypertension: Secondary | ICD-10-CM | POA: Diagnosis not present

## 2015-12-13 DIAGNOSIS — M545 Low back pain: Secondary | ICD-10-CM | POA: Insufficient documentation

## 2015-12-13 DIAGNOSIS — Z79891 Long term (current) use of opiate analgesic: Secondary | ICD-10-CM | POA: Insufficient documentation

## 2015-12-13 DIAGNOSIS — M5126 Other intervertebral disc displacement, lumbar region: Secondary | ICD-10-CM | POA: Diagnosis not present

## 2015-12-13 DIAGNOSIS — R209 Unspecified disturbances of skin sensation: Secondary | ICD-10-CM | POA: Insufficient documentation

## 2015-12-13 DIAGNOSIS — K580 Irritable bowel syndrome with diarrhea: Secondary | ICD-10-CM | POA: Insufficient documentation

## 2015-12-13 DIAGNOSIS — G4730 Sleep apnea, unspecified: Secondary | ICD-10-CM | POA: Insufficient documentation

## 2015-12-13 DIAGNOSIS — K432 Incisional hernia without obstruction or gangrene: Secondary | ICD-10-CM | POA: Insufficient documentation

## 2015-12-13 DIAGNOSIS — J440 Chronic obstructive pulmonary disease with acute lower respiratory infection: Secondary | ICD-10-CM | POA: Insufficient documentation

## 2015-12-13 DIAGNOSIS — J310 Chronic rhinitis: Secondary | ICD-10-CM | POA: Insufficient documentation

## 2015-12-13 DIAGNOSIS — K219 Gastro-esophageal reflux disease without esophagitis: Secondary | ICD-10-CM | POA: Insufficient documentation

## 2015-12-13 DIAGNOSIS — Z9889 Other specified postprocedural states: Secondary | ICD-10-CM

## 2015-12-13 DIAGNOSIS — H5210 Myopia, unspecified eye: Secondary | ICD-10-CM | POA: Insufficient documentation

## 2015-12-13 MED ORDER — TRAZODONE HCL 100 MG PO TABS: 100.0000 mg | ORAL_TABLET | Freq: Every day | ORAL | 0 refills | Status: DC

## 2015-12-13 MED ORDER — ORPHENADRINE CITRATE ER 100 MG PO TB12: 100.0000 mg | ORAL_TABLET | Freq: Two times a day (BID) | ORAL | 0 refills | Status: DC | PRN

## 2015-12-13 NOTE — Patient Instructions (Signed)
INSTRUCTED TO GET LABWORK AND XRAYS AT [facility].  DR Marjory Lies WILL CALL YOU FOR AN APPOINTMENT.  WHEN YOU HAVE DONE ALL OF THE ABOVE, CALL OFFICE AND MAKE AN APPOINTMENT.

## 2015-12-18 ENCOUNTER — Encounter: Payer: Self-pay | Admitting: *Deleted

## 2015-12-18 DIAGNOSIS — G8929 Other chronic pain: Secondary | ICD-10-CM | POA: Diagnosis not present

## 2015-12-18 DIAGNOSIS — F17210 Nicotine dependence, cigarettes, uncomplicated: Secondary | ICD-10-CM | POA: Diagnosis not present

## 2015-12-18 DIAGNOSIS — Z79899 Other long term (current) drug therapy: Secondary | ICD-10-CM | POA: Diagnosis not present

## 2015-12-18 DIAGNOSIS — M545 Low back pain: Secondary | ICD-10-CM | POA: Diagnosis not present

## 2015-12-18 DIAGNOSIS — J449 Chronic obstructive pulmonary disease, unspecified: Secondary | ICD-10-CM | POA: Diagnosis not present

## 2015-12-18 DIAGNOSIS — I10 Essential (primary) hypertension: Secondary | ICD-10-CM | POA: Insufficient documentation

## 2015-12-18 NOTE — ED Triage Notes (Signed)
Pt has had recent change in pain clinic provider, pt has no PCP. Pt was out of state [place] 1 month, states she has been out of Norco x 1 month. Pt states her back pain is worse since 2 days ago, last Norco taken at that time.

## 2015-12-19 ENCOUNTER — Emergency Department
Admission: EM | Admit: 2015-12-19 | Discharge: 2015-12-19 | Disposition: A | Discharge status: Home / Self Care | Payer: Medicare Other | Attending: Emergency Medicine | Admitting: Emergency Medicine

## 2015-12-19 DIAGNOSIS — G8929 Other chronic pain: Secondary | ICD-10-CM

## 2015-12-19 DIAGNOSIS — M545 Low back pain, unspecified: Secondary | ICD-10-CM

## 2015-12-19 DIAGNOSIS — M549 Dorsalgia, unspecified: Secondary | ICD-10-CM

## 2015-12-19 MED ORDER — LIDOCAINE 5 % EX PTCH: 1.0000 | MEDICATED_PATCH | Freq: Two times a day (BID) | CUTANEOUS | 0 refills | Status: DC

## 2015-12-19 MED ORDER — LIDOCAINE 5 % EX PTCH
1.0000 | MEDICATED_PATCH | CUTANEOUS | Status: DC
  Administered 2015-12-19: 1 via TRANSDERMAL
  Filled 2015-12-19: qty 1

## 2015-12-19 MED ORDER — HYDROCODONE-ACETAMINOPHEN 10-325 MG PO TABS
1.0000 | ORAL_TABLET | Freq: Once | ORAL | Status: AC
  Administered 2015-12-19: 1 via ORAL
  Filled 2015-12-19: qty 1

## 2015-12-19 NOTE — ED Notes (Signed)
Pt in via triage with chronic back pain; pt reports being seen at pain clinic, Dr. Tomasita Crumble recently leaving the clinic, pain clinic now tells her she is no longer under contract with them.  Pt reports no PCP.  Pt took last Norco "4 days ago."  Pt A/Ox4, no immediate distress at this time.  MD at bedside.
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
## 2015-12-21 ENCOUNTER — Ambulatory Visit
Admission: RE | Admit: 2015-12-21 | Discharge: 2015-12-21 | Disposition: A | Discharge status: Home / Self Care | Payer: Medicare Other | Source: Ambulatory Visit | Attending: Pain Medicine | Admitting: Pain Medicine

## 2015-12-21 ENCOUNTER — Other Ambulatory Visit
Admission: RE | Admit: 2015-12-21 | Discharge: 2015-12-21 | Disposition: A | Discharge status: Home / Self Care | Payer: Medicare Other | Source: Ambulatory Visit | Attending: Pain Medicine | Admitting: Pain Medicine

## 2015-12-21 DIAGNOSIS — M25559 Pain in unspecified hip: Principal | ICD-10-CM

## 2015-12-21 DIAGNOSIS — G8929 Other chronic pain: Secondary | ICD-10-CM

## 2015-12-21 DIAGNOSIS — M25552 Pain in left hip: Secondary | ICD-10-CM | POA: Diagnosis not present

## 2015-12-21 DIAGNOSIS — M25551 Pain in right hip: Secondary | ICD-10-CM | POA: Diagnosis not present

## 2015-12-21 DIAGNOSIS — R209 Unspecified disturbances of skin sensation: Secondary | ICD-10-CM

## 2015-12-21 LAB — COMPREHENSIVE METABOLIC PANEL
ALBUMIN: 4.3 g/dL (ref 3.5–5.0)
ALK PHOS: 86 U/L (ref 38–126)
ALT: 10 U/L — ABNORMAL LOW (ref 14–54)
ANION GAP: 6 (ref 5–15)
AST: 20 U/L (ref 15–41)
BUN: 16 mg/dL (ref 6–20)
CO2: 28 mmol/L (ref 22–32)
Calcium: 9.1 mg/dL (ref 8.9–10.3)
Chloride: 106 mmol/L (ref 101–111)
Creatinine, Ser: 1.14 mg/dL — ABNORMAL HIGH (ref 0.44–1.00)
GFR calc Af Amer: >60 mL/min (ref >60)
GFR calc non Af Amer: 52 mL/min — ABNORMAL LOW (ref >60)
GLUCOSE: 62 mg/dL — AB (ref 65–99)
POTASSIUM: 4.2 mmol/L (ref 3.5–5.1)
SODIUM: 140 mmol/L (ref 135–145)
Total Bilirubin: 0.5 mg/dL (ref 0.3–1.2)
Total Protein: 7.2 g/dL (ref 6.5–8.1)

## 2015-12-21 LAB — C-REACTIVE PROTEIN: CRP: <0.5 mg/dL (ref <1.0)

## 2015-12-21 LAB — VITAMIN B12: VITAMIN B 12: 389 pg/mL (ref 180–914)

## 2015-12-21 LAB — SEDIMENTATION RATE: SED RATE: 4 mm/h (ref 0–30)

## 2015-12-21 LAB — MAGNESIUM: Magnesium: 2 mg/dL (ref 1.7–2.4)

## 2015-12-22 LAB — COMPLIANCE DRUG ANALYSIS, UR: PDF: 0

## 2015-12-24 LAB — 25-HYDROXY VITAMIN D LCMS D2+D3
25-Hydroxy, Vitamin D-2: <1 ng/mL
25-Hydroxy, Vitamin D: 32 ng/mL

## 2015-12-24 LAB — 25-HYDROXYVITAMIN D LCMS D2+D3: 25-HYDROXY, VITAMIN D-3: 32 ng/mL

## 2016-01-03 DIAGNOSIS — F4542 Pain disorder with related psychological factors: Secondary | ICD-10-CM | POA: Diagnosis not present

## 2016-01-10 ENCOUNTER — Telehealth: Payer: Self-pay | Admitting: Pain Medicine

## 2016-01-10 NOTE — Telephone Encounter (Addendum)
Patient states she will be out of meds before her next appt on 9-28 no appts available before that.  She was new patient on 8-9 please let patient know what to do about medications   Patient is calling about muscle relaxers, says dr Dossie Arbour prescribed .

## 2016-01-11 NOTE — Telephone Encounter (Signed)
Spoke with Dr. Dossie Arbour about this, no prescriptions will be written outside of appointment. Patient notified.

## 2016-01-14 NOTE — Progress Notes (Signed)
Low blood sugar is considered to be less than 70 mg/dl.  Normal Creatinine levels are between 0.5 and 0.9 mg/dl for our lab. Any condition that impairs the function of the kidneys is likely to raise the creatinine level in the blood. The most common causes of longstanding (chronic) kidney disease in adults are high blood pressure and diabetes. Other causes of elevated blood creatinine levels include drugs, ingestion of a large amount of dietary meat, kidney infections, rhabdomyolysis (abnormal muscle breakdown), and urinary tract obstruction. While most low ALT level results indicate a normal healthy liver, that may not always be the case. A low-functioning or non-functioning liver, lacking normal levels of ALT activity to begin with, would not release a lot of ALT into the blood when damaged. People infected with the hepatitis C virus initially show high ALT levels in their blood, but these levels fall over time. Because the ALT test measures ALT levels at only one point in time, people with chronic hepatitis C infection may already have experienced the ALT peak well before blood was drawn for the ALT test. Urinary tract infections or malnutrition may also cause low blood ALT levels. eGFR (Estimated Glomerular Filtration Rate) results are reported as milliliters/minute/1.34m (mL/min/1.739m. Because some laboratories do not collect information on a patient's race when the sample is collected for testing, they may report calculated results for both African Americans and non-African Americans.  The NaNationwide Mutual InsuranceNStanding Rock [facility] Health Services Hospitalsuggests only reporting actual results once values are < 60 mL/min. 1. Normal values: 90-120 mL/min 2. Below 60 mL/min suggests that some kidney damage has occurred. 3. Between 5976nd 30 indicate (Moderate) Stage 3 kidney disease. 4. Between 29 and 15 represent (Severe) Stage 4 kidney disease. 5. Less than 15 is considered (Kidney Failure) Stage 5.

## 2016-01-31 ENCOUNTER — Other Ambulatory Visit: Payer: Self-pay | Admitting: Student

## 2016-01-31 DIAGNOSIS — M47816 Spondylosis without myelopathy or radiculopathy, lumbar region: Secondary | ICD-10-CM | POA: Insufficient documentation

## 2016-01-31 DIAGNOSIS — Z87828 Personal history of other (healed) physical injury and trauma: Secondary | ICD-10-CM | POA: Insufficient documentation

## 2016-01-31 DIAGNOSIS — R937 Abnormal findings on diagnostic imaging of other parts of musculoskeletal system: Secondary | ICD-10-CM | POA: Insufficient documentation

## 2016-01-31 NOTE — Progress Notes (Signed)
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  Certain types of  headaches  Preparation for the injection:  1. Do not eat any solid food or dairy products within 8 hours of your appointment. 2. You may drink clear liquid up to 3 hours before appointment.  Clear liquids include water, black coffee, juice or soda.  No milk or cream please. 3. You may take your regular medication, including pain medications, with a sip of water before your appointment.  Diabetics should hold regular insulin (if taken separately) and take 1/2 normal NPH dose the morning of the procedure.  Carry some sugar containing items with you to your appointment. 4. A driver must accompany you and be prepared to drive you home after your procedure. 5. Bring all your current medications with you. 6. An IV may be inserted and sedation may be given at the discretion of the physician. 7. A blood pressure cuff, EKG and other monitors will often be applied during the procedure.  Some patients may need to have extra oxygen administered for a short period. 8. You will be asked to provide medical information, including your allergies and medications, prior to the procedure.  We must know immediately if you are taking blood thinners (like Coumadin/Warfarin) or if you are allergic to IV iodine contrast (dye).  We must know if you could possible be pregnant.  Possible side-effects:   Bleeding from needle site  Infection (rare, may require surgery)  Nerve injury (rare)  Numbness & tingling (temporary)  Difficulty urinating (rare, temporary)  Spinal headache (a headache worse with upright posture)  Light-headedness (temporary)  Pain at injection site (serveral days)  Decreased blood pressure (rare, temporary)  Weakness in arm/leg (temporary)  Pressure  sensation in back/neck (temporary)   Call if you experience:   Fever/chills associated with headache or increased back/neck pain  Headache worsened by an upright position  New onset, weakness or numbness of an extremity below the injection site  Hives or difficulty breathing (go to the emergency room)  Inflammation or drainage at the injection site(s)  Severe back/neck pain greater than usual  New symptoms which are concerning to you  Please note:  Although the local anesthetic injected can often make your back or neck feel good for several hours after the injection, the pain will likely return. It takes 3-7 days for steroids to work.  You may not notice any pain relief for at least one week.  If effective, we will often do a series of 2-3 injections spaced 3-6 weeks apart to maximally decrease your pain.  After the initial series, you may be a candidate for a more permanent nerve block of the facets.  If you have any questions, please call #[phone]) [place] FOR 8 HOURS PRIOR TO PROCEDURE BRING A DRIVER

## 2016-02-01 ENCOUNTER — Ambulatory Visit: Payer: Medicare Other | Attending: Pain Medicine | Admitting: Pain Medicine

## 2016-02-01 ENCOUNTER — Encounter: Payer: Self-pay | Admitting: Pain Medicine

## 2016-02-01 VITALS — BP 143/68 | HR 102 | Temp 98.2°F | Resp 16 | Ht 64.0 in | Wt 116.0 lb

## 2016-02-01 DIAGNOSIS — G8929 Other chronic pain: Secondary | ICD-10-CM

## 2016-02-01 DIAGNOSIS — Z888 Allergy status to other drugs, medicaments and biological substances status: Secondary | ICD-10-CM | POA: Insufficient documentation

## 2016-02-01 DIAGNOSIS — E46 Unspecified protein-calorie malnutrition: Secondary | ICD-10-CM | POA: Diagnosis not present

## 2016-02-01 DIAGNOSIS — M545 Low back pain, unspecified: Secondary | ICD-10-CM

## 2016-02-01 DIAGNOSIS — M5440 Lumbago with sciatica, unspecified side: Secondary | ICD-10-CM

## 2016-02-01 DIAGNOSIS — E785 Hyperlipidemia, unspecified: Secondary | ICD-10-CM | POA: Diagnosis not present

## 2016-02-01 DIAGNOSIS — G4709 Other insomnia: Secondary | ICD-10-CM | POA: Diagnosis not present

## 2016-02-01 DIAGNOSIS — J449 Chronic obstructive pulmonary disease, unspecified: Secondary | ICD-10-CM | POA: Insufficient documentation

## 2016-02-01 DIAGNOSIS — Z809 Family history of malignant neoplasm, unspecified: Secondary | ICD-10-CM | POA: Diagnosis not present

## 2016-02-01 DIAGNOSIS — G43909 Migraine, unspecified, not intractable, without status migrainosus: Secondary | ICD-10-CM | POA: Diagnosis not present

## 2016-02-01 DIAGNOSIS — Z681 Body mass index (BMI) 19 or less, adult: Secondary | ICD-10-CM | POA: Diagnosis not present

## 2016-02-01 DIAGNOSIS — Z90710 Acquired absence of both cervix and uterus: Secondary | ICD-10-CM | POA: Diagnosis not present

## 2016-02-01 DIAGNOSIS — Z8371 Family history of colonic polyps: Secondary | ICD-10-CM | POA: Insufficient documentation

## 2016-02-01 DIAGNOSIS — Z886 Allergy status to analgesic agent status: Secondary | ICD-10-CM | POA: Insufficient documentation

## 2016-02-01 DIAGNOSIS — R252 Cramp and spasm: Secondary | ICD-10-CM

## 2016-02-01 DIAGNOSIS — I493 Ventricular premature depolarization: Secondary | ICD-10-CM | POA: Diagnosis not present

## 2016-02-01 DIAGNOSIS — Z79891 Long term (current) use of opiate analgesic: Secondary | ICD-10-CM

## 2016-02-01 DIAGNOSIS — Z87828 Personal history of other (healed) physical injury and trauma: Secondary | ICD-10-CM

## 2016-02-01 DIAGNOSIS — F1190 Opioid use, unspecified, uncomplicated: Secondary | ICD-10-CM

## 2016-02-01 DIAGNOSIS — M25559 Pain in unspecified hip: Secondary | ICD-10-CM

## 2016-02-01 DIAGNOSIS — Z7189 Other specified counseling: Secondary | ICD-10-CM

## 2016-02-01 DIAGNOSIS — Z8489 Family history of other specified conditions: Secondary | ICD-10-CM | POA: Insufficient documentation

## 2016-02-01 DIAGNOSIS — K219 Gastro-esophageal reflux disease without esophagitis: Secondary | ICD-10-CM | POA: Insufficient documentation

## 2016-02-01 DIAGNOSIS — M961 Postlaminectomy syndrome, not elsewhere classified: Secondary | ICD-10-CM

## 2016-02-01 DIAGNOSIS — F17210 Nicotine dependence, cigarettes, uncomplicated: Secondary | ICD-10-CM | POA: Insufficient documentation

## 2016-02-01 DIAGNOSIS — Z885 Allergy status to narcotic agent status: Secondary | ICD-10-CM | POA: Insufficient documentation

## 2016-02-01 DIAGNOSIS — I10 Essential (primary) hypertension: Secondary | ICD-10-CM | POA: Insufficient documentation

## 2016-02-01 DIAGNOSIS — M7918 Myalgia, other site: Secondary | ICD-10-CM

## 2016-02-01 DIAGNOSIS — M792 Neuralgia and neuritis, unspecified: Secondary | ICD-10-CM

## 2016-02-01 DIAGNOSIS — G4701 Insomnia due to medical condition: Secondary | ICD-10-CM

## 2016-02-01 DIAGNOSIS — R937 Abnormal findings on diagnostic imaging of other parts of musculoskeletal system: Secondary | ICD-10-CM

## 2016-02-01 DIAGNOSIS — M47816 Spondylosis without myelopathy or radiculopathy, lumbar region: Secondary | ICD-10-CM

## 2016-02-01 DIAGNOSIS — M791 Myalgia: Secondary | ICD-10-CM

## 2016-02-01 MED ORDER — ORPHENADRINE CITRATE ER 100 MG PO TB12: 100.0000 mg | ORAL_TABLET | Freq: Two times a day (BID) | ORAL | 0 refills | Status: DC | PRN

## 2016-02-01 MED ORDER — HYDROCODONE-ACETAMINOPHEN 10-325 MG PO TABS: 1.0000 | ORAL_TABLET | Freq: Two times a day (BID) | ORAL | 0 refills | Status: DC

## 2016-02-01 MED ORDER — GABAPENTIN 100 MG PO CAPS: 100.0000 mg | ORAL_CAPSULE | Freq: Every day | ORAL | 0 refills | Status: DC

## 2016-02-01 NOTE — Progress Notes (Signed)
New patient here for 2nd visit after med/psych evaluation and plan of care per pain management.  Safety precautions to be maintained throughout the outpatient stay will include: orient to surroundings, keep bed in low position, maintain call bell within reach at all times, provide assistance with transfer out of bed and ambulation.

## 2016-02-01 NOTE — Patient Instructions (Addendum)
GENERAL RISKS AND COMPLICATIONS  What are the risk, side effects and possible complications? Generally speaking, most procedures are safe.  However, with any procedure there are risks, side effects, and the possibility of complications.  The risks and complications are dependent upon the sites that are lesioned, or the type of nerve block to be performed.  The closer the procedure is to the spine, the more serious the risks are.  Great care is taken when placing the radio frequency needles, block needles or lesioning probes, but sometimes complications can occur. 1. Infection: Any time there is an injection through the skin, there is a risk of infection.  This is why sterile conditions are used for these blocks.  There are four possible types of infection. 1. Localized skin infection. 2. Central Nervous System Infection-This can be in the form of Meningitis, which can be deadly. 3. Epidural Infections-This can be in the form of an epidural abscess, which can cause pressure inside of the spine, causing compression of the spinal cord with subsequent paralysis. This would require an emergency surgery to decompress, and there are no guarantees that the patient would recover from the paralysis. 4. Discitis-This is an infection of the intervertebral discs.  It occurs in about 1% of discography procedures.  It is difficult to treat and it may lead to surgery.        2. Pain: the needles have to go through skin and soft tissues, will cause soreness.       3. Damage to internal structures:  The nerves to be lesioned may be near blood vessels or    other nerves which can be potentially damaged.       4. Bleeding: Bleeding is more common if the patient is taking blood thinners such as  aspirin, Coumadin, Ticiid, Plavix, etc., or if he/she have some genetic predisposition  such as hemophilia. Bleeding into the spinal canal can cause compression of the spinal  cord with subsequent paralysis.  This would require an  emergency surgery to  decompress and there are no guarantees that the patient would recover from the  paralysis.       5. Pneumothorax:  Puncturing of a lung is a possibility, every time a needle is introduced in  the area of the chest or upper back.  Pneumothorax refers to free air around the  collapsed lung(s), inside of the thoracic cavity (chest cavity).  Another two possible  complications related to a similar event would include: Hemothorax and Chylothorax.   These are variations of the Pneumothorax, where instead of air around the collapsed  lung(s), you may have blood or chyle, respectively.       6. Spinal headaches: They may occur with any procedures in the area of the spine.       7. Persistent CSF (Cerebro-Spinal Fluid) leakage: This is a rare problem, but may occur  with prolonged intrathecal or epidural catheters either due to the formation of a fistulous  track or a dural tear.       8. Nerve damage: By working so close to the spinal cord, there is always a possibility of  nerve damage, which could be as serious as a permanent spinal cord injury with  paralysis.       9. Death:  Although rare, severe deadly allergic reactions known as "Anaphylactic  reaction" can occur to any of the medications used.      10. Worsening of the symptoms:  We can always make thing worse.    What are the chances of something like this happening? Chances of any of this occuring are extremely low.  By statistics, you have more of a chance of getting killed in a motor vehicle accident: while driving to the hospital than any of the above occurring .  Nevertheless, you should be aware that they are possibilities.  In general, it is similar to taking a shower.  Everybody knows that you can slip, hit your head and get killed.  Does that mean that you should not shower again?  Nevertheless always keep in mind that statistics do not mean anything if you happen to be on the wrong side of them.  Even if a procedure has a 1  (one) in a 1,000,000 (million) chance of going wrong, it you happen to be that one..Also, keep in mind that by statistics, you have more of a chance of having something go wrong when taking medications.  Who should not have this procedure? If you are on a blood thinning medication (e.g. Coumadin, Plavix, see list of "Blood Thinners"), or if you have an active infection going on, you should not have the procedure.  If you are taking any blood thinners, please inform your physician.  How should I prepare for this procedure?  Do not eat or drink anything at least six hours prior to the procedure.  Bring a driver with you .  It cannot be a taxi.  Come accompanied by an adult that can drive you back, and that is strong enough to help you if your legs get weak or numb from the local anesthetic.  Take all of your medicines the morning of the procedure with just enough water to swallow them.  If you have diabetes, make sure that you are scheduled to have your procedure done first thing in the morning, whenever possible.  If you have diabetes, take only half of your insulin dose and notify our nurse that you have done so as soon as you arrive at the clinic.  If you are diabetic, but only take blood sugar pills (oral hypoglycemic), then do not take them on the morning of your procedure.  You may take them after you have had the procedure.  Do not take aspirin or any aspirin-containing medications, at least eleven (11) days prior to the procedure.  They may prolong bleeding.  Wear loose fitting clothing that may be easy to take off and that you would not mind if it got stained with Betadine or blood.  Do not wear any jewelry or perfume  Remove any nail coloring.  It will interfere with some of our monitoring equipment.  NOTE: Remember that this is not meant to be interpreted as a complete list of all possible complications.  Unforeseen problems may occur.  BLOOD THINNERS The following drugs  contain aspirin or other products, which can cause increased bleeding during surgery and should not be taken for 2 weeks prior to and 1 week after surgery.  If you should need take something for relief of minor pain, you may take acetaminophen which is found in Tylenol,m Datril, Anacin-3 and Panadol. It is not blood thinner. The products listed below are.  Do not take any of the products listed below in addition to any listed on your instruction sheet.  A.P.C or A.P.C with Codeine Codeine Phosphate Capsules #3 Ibuprofen Ridaura  ABC compound Congesprin Imuran rimadil  Advil Cope Indocin Robaxisal  Alka-Seltzer Effervescent Pain Reliever and Antacid Coricidin or Coricidin-D  Indomethacin Rufen    Alka-Seltzer plus Cold Medicine Cosprin Ketoprofen S-A-C Tablets  Anacin Analgesic Tablets or Capsules Coumadin Korlgesic Salflex  Anacin Extra Strength Analgesic tablets or capsules CP-2 Tablets Lanoril Salicylate  Anaprox Cuprimine Capsules Levenox Salocol  Anexsia-D Dalteparin Magan Salsalate  Anodynos Darvon compound Magnesium Salicylate Sine-off  Ansaid Dasin Capsules Magsal Sodium Salicylate  Anturane Depen Capsules Marnal Soma  APF Arthritis pain formula Dewitt's Pills Measurin Stanback  Argesic Dia-Gesic Meclofenamic Sulfinpyrazone  Arthritis Bayer Timed Release Aspirin Diclofenac Meclomen Sulindac  Arthritis pain formula Anacin Dicumarol Medipren Supac  Analgesic (Safety coated) Arthralgen Diffunasal Mefanamic Suprofen  Arthritis Strength Bufferin Dihydrocodeine Mepro Compound Suprol  Arthropan liquid Dopirydamole Methcarbomol with Aspirin Synalgos  ASA tablets/Enseals Disalcid Micrainin Tagament  Ascriptin Doan's Midol Talwin  Ascriptin A/D Dolene Mobidin Tanderil  Ascriptin Extra Strength Dolobid Moblgesic Ticlid  Ascriptin with Codeine Doloprin or Doloprin with Codeine Momentum Tolectin  Asperbuf Duoprin Mono-gesic Trendar  Aspergum Duradyne Motrin or Motrin IB Triminicin  Aspirin  plain, buffered or enteric coated Durasal Myochrisine Trigesic  Aspirin Suppositories Easprin Nalfon Trillsate  Aspirin with Codeine Ecotrin Regular or Extra Strength Naprosyn Uracel  Atromid-S Efficin Naproxen Ursinus  Auranofin Capsules Elmiron Neocylate Vanquish  Axotal Emagrin Norgesic Verin  Azathioprine Empirin or Empirin with Codeine Normiflo Vitamin E  Azolid Emprazil Nuprin Voltaren  Bayer Aspirin plain, buffered or children's or timed BC Tablets or powders Encaprin Orgaran Warfarin Sodium  Buff-a-Comp Enoxaparin Orudis Zorpin  Buff-a-Comp with Codeine Equegesic Os-Cal-Gesic   Buffaprin Excedrin plain, buffered or Extra Strength Oxalid   Bufferin Arthritis Strength Feldene Oxphenbutazone   Bufferin plain or Extra Strength Feldene Capsules Oxycodone with Aspirin   Bufferin with Codeine Fenoprofen Fenoprofen Pabalate or Pabalate-SF   Buffets II Flogesic Panagesic   Buffinol plain or Extra Strength Florinal or Florinal with Codeine Panwarfarin   Buf-Tabs Flurbiprofen Penicillamine   Butalbital Compound Four-way cold tablets Penicillin   Butazolidin Fragmin Pepto-Bismol   Carbenicillin Geminisyn Percodan   Carna Arthritis Reliever Geopen Persantine   Carprofen Gold's salt Persistin   Chloramphenicol Goody's Phenylbutazone   Chloromycetin Haltrain Piroxlcam   Clmetidine heparin Plaquenil   Cllnoril Hyco-pap Ponstel   Clofibrate Hydroxy chloroquine Propoxyphen         Before stopping any of these medications, be sure to consult the physician who ordered them.  Some, such as Coumadin (Warfarin) are ordered to prevent or treat serious conditions such as "deep thrombosis", "pumonary embolisms", and other heart problems.  The amount of time that you may need off of the medication may also vary with the medication and the reason for which you were taking it.  If you are taking any of these medications, please make sure you notify your pain physician before you undergo any  procedures.         Facet Blocks Patient Information  Description: The facets are joints in the spine between the vertebrae.  Like any joints in the body, facets can become irritated and painful.  Arthritis can also effect the facets.  By injecting steroids and local anesthetic in and around these joints, we can temporarily block the nerve supply to them.  Steroids act directly on irritated nerves and tissues to reduce selling and inflammation which often leads to decreased pain.  Facet blocks may be done anywhere along the spine from the neck to the low back depending upon the location of your pain.   After numbing the skin with local anesthetic (like Novocaine), a small needle is passed onto the facet joints under x-ray guidance.    You may experience a sensation of pressure while this is being done.  The entire block usually lasts about 15-25 minutes.   Conditions which may be treated by facet blocks:   Low back/buttock pain  Neck/shoulder pain  Certain types of headaches  Preparation for the injection:  1. Do not eat any solid food or dairy products within 8 hours of your appointment. 2. You may drink clear liquid up to 3 hours before appointment.  Clear liquids include water, black coffee, juice or soda.  No milk or cream please. 3. You may take your regular medication, including pain medications, with a sip of water before your appointment.  Diabetics should hold regular insulin (if taken separately) and take 1/2 normal NPH dose the morning of the procedure.  Carry some sugar containing items with you to your appointment. 4. A driver must accompany you and be prepared to drive you home after your procedure. 5. Bring all your current medications with you. 6. An IV may be inserted and sedation may be given at the discretion of the physician. 7. A blood pressure cuff, EKG and other monitors will often be applied during the procedure.  Some patients may need to have extra oxygen  administered for a short period. 8. You will be asked to provide medical information, including your allergies and medications, prior to the procedure.  We must know immediately if you are taking blood thinners (like Coumadin/Warfarin) or if you are allergic to IV iodine contrast (dye).  We must know if you could possible be pregnant.  Possible side-effects:   Bleeding from needle site  Infection (rare, may require surgery)  Nerve injury (rare)  Numbness & tingling (temporary)  Difficulty urinating (rare, temporary)  Spinal headache (a headache worse with upright posture)  Light-headedness (temporary)  Pain at injection site (serveral days)  Decreased blood pressure (rare, temporary)  Weakness in arm/leg (temporary)  Pressure sensation in back/neck (temporary)   Call if you experience:   Fever/chills associated with headache or increased back/neck pain  Headache worsened by an upright position  New onset, weakness or numbness of an extremity below the injection site  Hives or difficulty breathing (go to the emergency room)  Inflammation or drainage at the injection site(s)  Severe back/neck pain greater than usual  New symptoms which are concerning to you  Please note:  Although the local anesthetic injected can often make your back or neck feel good for several hours after the injection, the pain will likely return. It takes 3-7 days for steroids to work.  You may not notice any pain relief for at least one week.  If effective, we will often do a series of 2-3 injections spaced 3-6 weeks apart to maximally decrease your pain.  After the initial series, you may be a candidate for a more permanent nerve block of the facets.  If you have any questions, please call #[phone]) [place] FOR 8 HOURS PRIOR TO PROCEDURE BRING A DRIVER

## 2016-02-13 ENCOUNTER — Ambulatory Visit
Admission: RE | Admit: 2016-02-13 | Discharge: 2016-02-13 | Disposition: A | Discharge status: Home / Self Care | Payer: Medicare Other | Source: Ambulatory Visit | Attending: Pain Medicine | Admitting: Pain Medicine

## 2016-02-13 ENCOUNTER — Encounter: Payer: Self-pay | Admitting: Pain Medicine

## 2016-02-13 ENCOUNTER — Ambulatory Visit (HOSPITAL_BASED_OUTPATIENT_CLINIC_OR_DEPARTMENT_OTHER): Payer: Medicare Other | Admitting: Pain Medicine

## 2016-02-13 VITALS — BP 118/60 | HR 82 | Temp 97.3°F | Resp 16 | Ht 64.0 in | Wt 116.0 lb

## 2016-02-13 DIAGNOSIS — G8929 Other chronic pain: Secondary | ICD-10-CM | POA: Insufficient documentation

## 2016-02-13 DIAGNOSIS — M47816 Spondylosis without myelopathy or radiculopathy, lumbar region: Secondary | ICD-10-CM | POA: Diagnosis not present

## 2016-02-13 DIAGNOSIS — M545 Low back pain: Secondary | ICD-10-CM | POA: Insufficient documentation

## 2016-02-13 DIAGNOSIS — Z981 Arthrodesis status: Secondary | ICD-10-CM | POA: Insufficient documentation

## 2016-02-13 DIAGNOSIS — Z90710 Acquired absence of both cervix and uterus: Secondary | ICD-10-CM | POA: Diagnosis not present

## 2016-02-13 DIAGNOSIS — M1288 Other specific arthropathies, not elsewhere classified, other specified site: Secondary | ICD-10-CM

## 2016-02-13 DIAGNOSIS — Z885 Allergy status to narcotic agent status: Secondary | ICD-10-CM | POA: Diagnosis not present

## 2016-02-13 DIAGNOSIS — Z886 Allergy status to analgesic agent status: Secondary | ICD-10-CM | POA: Insufficient documentation

## 2016-02-13 DIAGNOSIS — Z888 Allergy status to other drugs, medicaments and biological substances status: Secondary | ICD-10-CM | POA: Diagnosis not present

## 2016-02-13 MED ORDER — MIDAZOLAM HCL 5 MG/5ML IJ SOLN
INTRAMUSCULAR | Status: AC
  Administered 2016-02-13: 3 mg via INTRAVENOUS
  Filled 2016-02-13: qty 5

## 2016-02-13 MED ORDER — LIDOCAINE HCL (PF) 1 % IJ SOLN
10.0000 mL | Freq: Once | INTRAMUSCULAR | Status: AC
  Administered 2016-02-13: 10 mL
  Filled 2016-02-13: qty 10

## 2016-02-13 MED ORDER — FENTANYL CITRATE (PF) 100 MCG/2ML IJ SOLN
INTRAMUSCULAR | Status: AC
  Administered 2016-02-13: 50 ug via INTRAVENOUS
  Filled 2016-02-13: qty 2

## 2016-02-13 MED ORDER — ROPIVACAINE HCL 2 MG/ML IJ SOLN
9.0000 mL | Freq: Once | INTRAMUSCULAR | Status: AC
  Administered 2016-02-13: 18 mL
  Filled 2016-02-13: qty 20

## 2016-02-13 MED ORDER — TRIAMCINOLONE ACETONIDE 40 MG/ML IJ SUSP
40.0000 mg | Freq: Once | INTRAMUSCULAR | Status: AC
  Administered 2016-02-13: 80 mg
  Filled 2016-02-13: qty 2

## 2016-02-13 NOTE — Patient Instructions (Signed)
Facet Joint Block The facet joints connect the bones of the spine (vertebrae). They make it possible for you to bend, twist, and make other movements with your spine. They also prevent you from overbending, overtwisting, and making other excessive movements.  A facet joint block is a procedure where a numbing medicine (anesthetic) is injected into a facet joint. Often, a type of anti-inflammatory medicine called a steroid is also injected. A facet joint block may be done for two reasons:   Diagnosis. A facet joint block may be done as a test to see whether neck or back pain is caused by a worn-down or infected facet joint. If the pain gets better after a facet joint block, it means the pain is probably coming from the facet joint. If the pain does not get better, it means the pain is probably not coming from the facet joint.   Therapy. A facet joint block may be done to relieve neck or back pain caused by a facet joint. A facet joint block is only done as a therapy if the pain does not improve with medicine, exercise programs, physical therapy, and other forms of pain management. LET YOUR HEALTH CARE PROVIDER KNOW ABOUT:   Any allergies you have.   All medicines you are taking, including vitamins, herbs, eyedrops, and over-the-counter medicines and creams.   Previous problems you or members of your family have had with the use of anesthetics.   Any blood disorders you have had.   Other health problems you have. RISKS AND COMPLICATIONS Generally, having a facet joint block is safe. However, as with any procedure, complications can occur. Possible complications associated with having a facet joint block include:   Bleeding.   Injury to a nerve near the injection site.   Pain at the injection site.   Weakness or numbness in areas controlled by nerves near the injection site.   Infection.   Temporary fluid retention.   Allergic reaction to anesthetics or medicines used during  the procedure. BEFORE THE PROCEDURE   Follow your health care provider's instructions if you are taking dietary supplements or medicines. You may need to stop taking them or reduce your dosage.   Do not take any new dietary supplements or medicines without asking your health care provider first.   Follow your health care provider's instructions about eating and drinking before the procedure. You may need to stop eating and drinking several hours before the procedure.   Arrange to have an adult drive you home after the procedure. PROCEDURE  You may need to remove your clothing and dress in an open-back gown so that your health care provider can access your spine.   The procedure will be done while you are lying on an X-ray table. Most of the time you will be asked to lie on your stomach, but you may be asked to lie in a different position if an injection will be made in your neck.   Special machines will be used to monitor your oxygen levels, heart rate, and blood pressure.   If an injection will be made in your neck, an intravenous (IV) tube will be inserted into one of your veins. Fluids and medicine will flow directly into your body through the IV tube.   The area over the facet joint where the injection will be made will be cleaned with an antiseptic soap. The surrounding skin will be covered with sterile drapes.   An anesthetic will be applied to your skin   to make the injection area numb. You may feel a temporary stinging or burning sensation.   A video X-ray machine will be used to locate the joint. A contrast dye may be injected into the facet joint area to help with locating the joint.   When the joint is located, an anesthetic medicine will be injected into the joint through the needle.   Your health care provider will ask you whether you feel pain relief. If you do feel relief, a steroid may be injected to provide pain relief for a longer period of time. If you do not  feel relief or feel only partial relief, additional injections of an anesthetic may be made in other facet joints.   The needle will be removed, the skin will be cleansed, and bandages will be applied.  AFTER THE PROCEDURE   You will be observed for 15-30 minutes before being allowed to go home. Do not drive. Have an adult drive you or take a taxi or public transportation instead.   If you feel pain relief, the pain will return in several hours or days when the anesthetic wears off.   You may feel pain relief 2-14 days after the procedure. The amount of time this relief lasts varies from person to person.   It is normal to feel some tenderness over the injected area(s) for 2 days following the procedure.   If you have diabetes, you may have a temporary increase in blood sugar.   This information is not intended to replace advice given to you by your health care provider. Make sure you discuss any questions you have with your health care provider.   Document Released: 09/11/2006 Document Revised: 05/13/2014 Document Reviewed: 02/10/2012 [employer] Interactive Patient Education 2016 [employer]  What are the risk, side effects and possible complications? Generally speaking, most procedures are safe.  However, with any procedure there are risks, side effects, and the possibility of complications.  The risks and complications are dependent upon the sites that are lesioned, or the type of nerve block to be performed.  The closer the procedure is to the spine, the more serious the risks are.  Great care is taken when placing the radio frequency needles, block needles or lesioning probes, but sometimes complications can occur. 1. Infection: Any time there is an injection through the skin, there is a risk of infection.  This is why sterile conditions are used for these blocks.  There are four possible types of infection. 1. Localized skin infection. 2. Central  Nervous System Infection-This can be in the form of Meningitis, which can be deadly. 3. Epidural Infections-This can be in the form of an epidural abscess, which can cause pressure inside of the spine, causing compression of the spinal cord with subsequent paralysis. This would require an emergency surgery to decompress, and there are no guarantees that the patient would recover from the paralysis. 4. Discitis-This is an infection of the intervertebral discs.  It occurs in about 1% of discography procedures.  It is difficult to treat and it may lead to surgery.        2. Pain: the needles have to go through skin and soft tissues, will cause soreness.       3. Damage to internal structures:  The nerves to be lesioned may be near blood vessels or    other nerves which can be potentially damaged.       4. Bleeding: Bleeding is more common if  the patient is taking blood thinners such as  aspirin, Coumadin, Ticiid, Plavix, etc., or if he/she have some genetic predisposition  such as hemophilia. Bleeding into the spinal canal can cause compression of the spinal  cord with subsequent paralysis.  This would require an emergency surgery to  decompress and there are no guarantees that the patient would recover from the  paralysis.       5. Pneumothorax:  Puncturing of a lung is a possibility, every time a needle is introduced in  the area of the chest or upper back.  Pneumothorax refers to free air around the  collapsed lung(s), inside of the thoracic cavity (chest cavity).  Another two possible  complications related to a similar event would include: Hemothorax and Chylothorax.   These are variations of the Pneumothorax, where instead of air around the collapsed  lung(s), you may have blood or chyle, respectively.       6. Spinal headaches: They may occur with any procedures in the area of the spine.       7. Persistent CSF (Cerebro-Spinal Fluid) leakage: This is a rare problem, but may occur  with prolonged  intrathecal or epidural catheters either due to the formation of a fistulous  track or a dural tear.       8. Nerve damage: By working so close to the spinal cord, there is always a possibility of  nerve damage, which could be as serious as a permanent spinal cord injury with  paralysis.       9. Death:  Although rare, severe deadly allergic reactions known as "Anaphylactic  reaction" can occur to any of the medications used.      10. Worsening of the symptoms:  We can always make thing worse.  What are the chances of something like this happening? Chances of any of this occuring are extremely low.  By statistics, you have more of a chance of getting killed in a motor vehicle accident: while driving to the hospital than any of the above occurring .  Nevertheless, you should be aware that they are possibilities.  In general, it is similar to taking a shower.  Everybody knows that you can slip, hit your head and get killed.  Does that mean that you should not shower again?  Nevertheless always keep in mind that statistics do not mean anything if you happen to be on the wrong side of them.  Even if a procedure has a 1 (one) in a 1,000,000 (million) chance of going wrong, it you happen to be that one..Also, keep in mind that by statistics, you have more of a chance of having something go wrong when taking medications.  Who should not have this procedure? If you are on a blood thinning medication (e.g. Coumadin, Plavix, see list of "Blood Thinners"), or if you have an active infection going on, you should not have the procedure.  If you are taking any blood thinners, please inform your physician.  How should I prepare for this procedure?  Do not eat or drink anything at least six hours prior to the procedure.  Bring a driver with you .  It cannot be a taxi.  Come accompanied by an adult that can drive you back, and that is strong enough to help you if your legs get weak or numb from the local  anesthetic.  Take all of your medicines the morning of the procedure with just enough water to swallow them.  If you have  diabetes, make sure that you are scheduled to have your procedure done first thing in the morning, whenever possible.  If you have diabetes, take only half of your insulin dose and notify our nurse that you have done so as soon as you arrive at the clinic.  If you are diabetic, but only take blood sugar pills (oral hypoglycemic), then do not take them on the morning of your procedure.  You may take them after you have had the procedure.  Do not take aspirin or any aspirin-containing medications, at least eleven (11) days prior to the procedure.  They may prolong bleeding.  Wear loose fitting clothing that may be easy to take off and that you would not mind if it got stained with Betadine or blood.  Do not wear any jewelry or perfume  Remove any nail coloring.  It will interfere with some of our monitoring equipment.  NOTE: Remember that this is not meant to be interpreted as a complete list of all possible complications.  Unforeseen problems may occur.  BLOOD THINNERS The following drugs contain aspirin or other products, which can cause increased bleeding during surgery and should not be taken for 2 weeks prior to and 1 week after surgery.  If you should need take something for relief of minor pain, you may take acetaminophen which is found in Tylenol,m Datril, Anacin-3 and Panadol. It is not blood thinner. The products listed below are.  Do not take any of the products listed below in addition to any listed on your instruction sheet.  A.P.C or A.P.C with Codeine Codeine Phosphate Capsules #3 Ibuprofen Ridaura  ABC compound Congesprin Imuran rimadil  Advil Cope Indocin Robaxisal  Alka-Seltzer Effervescent Pain Reliever and Antacid Coricidin or Coricidin-D  Indomethacin Rufen  Alka-Seltzer plus Cold Medicine Cosprin Ketoprofen S-A-C Tablets  Anacin Analgesic Tablets  or Capsules Coumadin Korlgesic Salflex  Anacin Extra Strength Analgesic tablets or capsules CP-2 Tablets Lanoril Salicylate  Anaprox Cuprimine Capsules Levenox Salocol  Anexsia-D Dalteparin Magan Salsalate  Anodynos Darvon compound Magnesium Salicylate Sine-off  Ansaid Dasin Capsules Magsal Sodium Salicylate  Anturane Depen Capsules Marnal Soma  APF Arthritis pain formula Dewitt's Pills Measurin Stanback  Argesic Dia-Gesic Meclofenamic Sulfinpyrazone  Arthritis Bayer Timed Release Aspirin Diclofenac Meclomen Sulindac  Arthritis pain formula Anacin Dicumarol Medipren Supac  Analgesic (Safety coated) Arthralgen Diffunasal Mefanamic Suprofen  Arthritis Strength Bufferin Dihydrocodeine Mepro Compound Suprol  Arthropan liquid Dopirydamole Methcarbomol with Aspirin Synalgos  ASA tablets/Enseals Disalcid Micrainin Tagament  Ascriptin Doan's Midol Talwin  Ascriptin A/D Dolene Mobidin Tanderil  Ascriptin Extra Strength Dolobid Moblgesic Ticlid  Ascriptin with Codeine Doloprin or Doloprin with Codeine Momentum Tolectin  Asperbuf Duoprin Mono-gesic Trendar  Aspergum Duradyne Motrin or Motrin IB Triminicin  Aspirin plain, buffered or enteric coated Durasal Myochrisine Trigesic  Aspirin Suppositories Easprin Nalfon Trillsate  Aspirin with Codeine Ecotrin Regular or Extra Strength Naprosyn Uracel  Atromid-S Efficin Naproxen Ursinus  Auranofin Capsules Elmiron Neocylate Vanquish  Axotal Emagrin Norgesic Verin  Azathioprine Empirin or Empirin with Codeine Normiflo Vitamin E  Azolid Emprazil Nuprin Voltaren  Bayer Aspirin plain, buffered or children's or timed BC Tablets or powders Encaprin Orgaran Warfarin Sodium  Buff-a-Comp Enoxaparin Orudis Zorpin  Buff-a-Comp with Codeine Equegesic Os-Cal-Gesic   Buffaprin Excedrin plain, buffered or Extra Strength Oxalid   Bufferin Arthritis Strength Feldene Oxphenbutazone   Bufferin plain or Extra Strength Feldene Capsules Oxycodone with Aspirin   Bufferin  with Codeine Fenoprofen Fenoprofen Pabalate or Pabalate-SF   Buffets II Flogesic Panagesic     Buffinol plain or Extra Strength Florinal or Florinal with Codeine Panwarfarin   Buf-Tabs Flurbiprofen Penicillamine   Butalbital Compound Four-way cold tablets Penicillin   Butazolidin Fragmin Pepto-Bismol   Carbenicillin Geminisyn Percodan   Carna Arthritis Reliever Geopen Persantine   Carprofen Gold's salt Persistin   Chloramphenicol Goody's Phenylbutazone   Chloromycetin Haltrain Piroxlcam   Clmetidine heparin Plaquenil   Cllnoril Hyco-pap Ponstel   Clofibrate Hydroxy chloroquine Propoxyphen         Before stopping any of these medications, be sure to consult the physician who ordered them.  Some, such as Coumadin (Warfarin) are ordered to prevent or treat serious conditions such as "deep thrombosis", "pumonary embolisms", and other heart problems.  The amount of time that you may need off of the medication may also vary with the medication and the reason for which you were taking it.  If you are taking any of these medications, please make sure you notify your pain physician before you undergo any procedures.          

## 2016-02-13 NOTE — Progress Notes (Signed)
Patient's Name: Paula Duncan  MRN: [id number]  Referring Provider: No ref. provider found  DOB: 09/07/1960  PCP: No PCP Per Patient  DOS: 02/13/2016  Note by: Kathlen Brunswick. Dossie Arbour, MD  Service setting: Ambulatory outpatient  Location: [facility] (AMB) Pain Management Facility  Visit type: Procedure  Specialty: Interventional Pain Management  Patient type: Established   Primary Reason for Visit: Interventional Pain Management Treatment. CC: Back Pain (low)  Procedure:  Anesthesia, Analgesia, Anxiolysis:  Type: Diagnostic Medial Branch Facet Block Region: Lumbar Level: L2, L3, L4, L5, & S1 Medial Branch Level(s) Laterality: Bilateral  Type: Moderate (Conscious) Sedation & Local Anesthesia Local Anesthetic: Lidocaine 1% Route: Intravenous (IV) IV Access: Secured Sedation: Meaningful verbal contact was maintained at all times during the procedure  Indication(s): Analgesia & Anxiolysis  Indications: 1. Lumbar facet syndrome (Location of Primary Source of Pain) (Bilateral) (R>L)   2. Chronic low back pain (Location of Primary Source of Pain) (Bilateral) (R>L)   3. Lumbar spondylosis    Pain Score: Pre-procedure: 6 /10 Post-procedure: 0-No pain/10  Pre-Procedure Assessment:  Paula Duncan is a 55 y.o. (year old), female patient, seen today for interventional treatment. She  has a past surgical history that includes Spine surgery (1994); Abdominal hysterectomy; Exploratory laparotomy; Cesarean section; Esophagogastroduodenoscopy (07/12/09); and left heart catheterization with coronary angiogram (N/A, 04/06/2013).. Her primarily concern today is the Back Pain (low) The primary encounter diagnosis was Lumbar facet syndrome (Location of Primary Source of Pain) (Bilateral) (R>L). Diagnoses of Chronic low back pain (Location of Primary Source of Pain) (Bilateral) (R>L) and Lumbar spondylosis were also pertinent to this visit.  Pain Type: Chronic pain Pain Location: Back Pain Orientation: Lower Pain  Descriptors / Indicators: Constant, Aching (compresssing ) Pain Frequency: Constant  Date of Last Visit: 02/01/16 Service Provided on Last Visit: Med Refill, Evaluation  Coagulation Parameters Lab Results  Component Value Date   INR 1.0 04/02/2013   LABPROT 10.7 04/02/2013   APTT  11/05/2008    37        IF BASELINE aPTT IS ELEVATED, SUGGEST PATIENT RISK ASSESSMENT BE USED TO DETERMINE APPROPRIATE ANTICOAGULANT THERAPY.   PLT 225 07/07/2014   Verification of the correct person, correct site (including marking of site), and correct procedure were performed and confirmed by the patient.  Consent: Before the procedure and under the influence of no sedative(s), amnesic(s), or anxiolytics, the patient was informed of the treatment options, risks and possible complications. To fulfill our ethical and legal obligations, as recommended by the American Medical Association's Code of Ethics, I have informed the patient of my clinical impression; the nature and purpose of the treatment or procedure; the risks, benefits, and possible complications of the intervention; the alternatives, including doing nothing; the risk(s) and benefit(s) of the alternative treatment(s) or procedure(s); and the risk(s) and benefit(s) of doing nothing. The patient was provided information about the general risks and possible complications associated with the procedure. These may include, but are not limited to: failure to achieve desired goals, infection, bleeding, organ or nerve damage, allergic reactions, paralysis, and death. In addition, the patient was informed of those risks and complications associated to Spine-related procedures, such as failure to decrease pain; infection (i.e.: Meningitis, epidural or intraspinal abscess); bleeding (i.e.: epidural hematoma, subarachnoid hemorrhage, or any other type of intraspinal or peri-dural bleeding); organ or nerve damage (i.e.: Any type of peripheral nerve, nerve root, or  spinal cord injury) with subsequent damage to sensory, motor, and/or autonomic systems, resulting in permanent pain, numbness, and/or  weakness of one or several areas of the body; allergic reactions; (i.e.: anaphylactic reaction); and/or death. Furthermore, the patient was informed of those risks and complications associated with the medications. These include, but are not limited to: allergic reactions (i.e.: anaphylactic or anaphylactoid reaction(s)); adrenal axis suppression; blood sugar elevation that in diabetics may result in ketoacidosis or comma; water retention that in patients with history of congestive heart failure may result in shortness of breath, pulmonary edema, and decompensation with resultant heart failure; weight gain; swelling or edema; medication-induced neural toxicity; particulate matter embolism and blood vessel occlusion with resultant organ, and/or nervous system infarction; and/or aseptic necrosis of one or more joints. Finally, the patient was informed that Medicine is not an exact science; therefore, there is also the possibility of unforeseen or unpredictable risks and/or possible complications that may result in a catastrophic outcome. The patient indicated having understood very clearly. We have given the patient no guarantees and we have made no promises. Enough time was given to the patient to ask questions, all of which were answered to the patient's satisfaction. Paula Duncan has indicated that she wanted to continue with the procedure.  Consent Attestation: I, the ordering provider, attest that I have discussed with the patient the benefits, risks, side-effects, alternatives, likelihood of achieving goals, and potential problems during recovery for the procedure that I have provided informed consent.  Pre-Procedure Preparation:  Safety Precautions: Allergies reviewed. The patient was asked about blood thinners, or active infections, both of which were denied. The patient  was asked to confirm the procedure and laterality, before marking the site, and again before commencing the procedure. Appropriate site, procedure, and patient were confirmed by following the Joint Commission's Universal Protocol (UP.01.01.01), in the form of a "Time Out". The patient was asked to participate by confirming the accuracy of the "Time Out" information. Patient was assessed for positional comfort and pressure points before starting the procedure. Allergies: She is allergic to nsaids; tramadol hcl; chantix [varenicline]; and metronidazole.. Allergy Precautions: None required Infection Control Precautions: Sterile technique used. Standard Universal Precautions were taken as recommended by the Department of [facility] for Disease Control and Prevention (CDC). Standard pre-surgical skin prep was conducted. Respiratory hygiene and cough etiquette was practiced. Hand hygiene observed. Safe injection practices and needle disposal techniques followed. SDV (single dose vial) medications used. Medications properly checked for expiration dates and contaminants. Personal protective equipment (PPE) used as per protocol. Monitoring:  As per clinic protocol. Vitals:   02/13/16 0946 02/13/16 0955 02/13/16 1005 02/13/16 1015  BP: 115/60 (!) 117/56 (!) 116/57 118/60  Pulse: 82 92 81 82  Resp: 11 16 13 16   Temp:  98.2 F (36.8 C)  97.3 F (36.3 C)  TempSrc:    Temporal  SpO2: 100% 97% 97% 97%  Weight:      Height:      Calculated BMI: Body mass index is 19.91 kg/m. Time-out: "Time-out" completed before starting procedure, as per protocol.  Description of Procedure Process:   Time-out: "Time-out" completed before starting procedure, as per protocol. Position: Prone Target Area: For Lumbar Facet blocks, the target is the groove formed by the junction of the transverse process and superior articular process. For the L5 dorsal ramus, the target is the notch between superior articular process  and sacral ala. For the S1 dorsal ramus, the target is the superior and lateral edge of the posterior S1 Sacral foramen. Approach: Paramedial approach. Area Prepped: Entire Posterior Lumbosacral Region Prepping solution: ChloraPrep (2%  chlorhexidine gluconate and 70% isopropyl alcohol) Safety Precautions: Aspiration looking for blood return was conducted prior to all injections. At no point did we inject any substances, as a needle was being advanced. No attempts were made at seeking any paresthesias. Safe injection practices and needle disposal techniques used. Medications properly checked for expiration dates. SDV (single dose vial) medications used. Description of the Procedure: Protocol guidelines were followed. The patient was placed in position over the fluoroscopy table. The target area was identified and the area prepped in the usual manner. Skin desensitized using vapocoolant spray. Skin & deeper tissues infiltrated with local anesthetic. Appropriate amount of time allowed to pass for local anesthetics to take effect. The procedure needle was introduced through the skin, ipsilateral to the reported pain, and advanced to the target area. Employing the "Medial Branch Technique", the needles were advanced to the angle made by the superior and medial portion of the transverse process, and the lateral and inferior portion of the superior articulating process of the targeted vertebral bodies. This area is known as "Burton's Eye" or the "Eye of the [place] Dog". A procedure needle was introduced through the skin, and this time advanced to the angle made by the superior and medial border of the sacral ala, and the lateral border of the S1 vertebral body. This last needle was later repositioned at the superior and lateral border of the posterior S1 foramen. Negative aspiration confirmed. Solution injected in intermittent fashion, asking for systemic symptoms every 0.5cc of injectate. The needles were then  removed and the area cleansed, making sure to leave some of the prepping solution back to take advantage of its long term bactericidal properties. EBL: None Materials & Medications Used:  Needle(s) Used: 22g - 3.5" Spinal Needle(s)   Illustration of the posterior view of the lumbar spine and the posterior neural structures. Laminae of L2 through S1 are labeled. DPRL5, dorsal primary ramus of L5; DPRS1, dorsal primary ramus of S1; DPR3, dorsal primary ramus of L3; FJ, facet (zygapophyseal) joint L3-L4; I, inferior articular process of L4; LB1, lateral branch of dorsal primary ramus of L1; IAB, inferior articular branches from L3 medial branch (supplies L4-L5 facet joint); IBP, intermediate branch plexus; MB3, medial branch of dorsal primary ramus of L3; NR3, third lumbar nerve root; S, superior articular process of L5; SAB, superior articular branches from L4 (supplies L4-5 facet joint also); TP3, transverse process of L3.  Imaging Guidance (Spinal):  Type of Imaging Technique: Fluoroscopy Guidance (Spinal) Indication(s): Assistance in needle guidance and placement for procedures requiring needle placement in or near specific anatomical locations not easily accessible without such assistance. Exposure Time: Please see nurses notes. Contrast: None used. Fluoroscopic Guidance: I was personally present during the use of fluoroscopy. "Tunnel Vision Technique" used to obtain the best possible view of the target area. Parallax error corrected before commencing the procedure. "Direction-depth-direction" technique used to introduce the needle under continuous pulsed fluoroscopy. Once target was reached, antero-posterior, oblique, and lateral fluoroscopic projection used confirm needle placement in all planes. Images permanently stored in EMR. Interpretation: No contrast injected. I personally interpreted the imaging intraoperatively. Adequate needle placement confirmed in multiple planes. Permanent images saved  into the patient's record.   Antibiotic Prophylaxis:  Indication(s): No indications identified. Type:  Antibiotics Given (last 72 hours)    None      Post-operative Assessment:  Complications: No immediate post-treatment complications observed by team, or reported by patient. Disposition: The patient tolerated the entire procedure well. A repeat set  of vitals were taken after the procedure and the patient was kept under observation following institutional policy, for this type of procedure. Post-procedural neurological assessment was performed, showing return to baseline, prior to discharge. The patient was provided with post-procedure discharge instructions, including a section on how to identify potential problems. Should any problems arise concerning this procedure, the patient was given instructions to immediately contact us, at any time, without hesitation. In any case, we plan to contact the patient by telephone for a follow-up status report regarding this interventional procedure. Comments:  No additional relevant information.  Plan of Care  Discharge to: Discharge home  Medications ordered for procedure: Meds ordered this encounter  Medications  . triamcinolone acetonide (KENALOG-40) injection 40 mg  . lidocaine (PF) (XYLOCAINE) 1 % injection 10 mL  . ropivacaine (PF) 2 mg/ml (0.2%) (NAROPIN) epidural 9 mL  . fentaNYL (SUBLIMAZE) 100 MCG/2ML injection    Hensley, Norma: cabinet override  . midazolam (VERSED) 5 MG/5ML injection    Hensley, Norma: cabinet override   Medications administered: (For more details, see medical record) We administered triamcinolone acetonide, lidocaine (PF), ropivacaine (PF) 2 mg/ml (0.2%), fentaNYL, and midazolam.  Imaging Ordered: No results found for this or any previous visit. New Prescriptions   No medications on file   Physician-requested Follow-up:  Return in about 2 weeks (around 02/27/2016) for Post-Procedure evaluation.  Future  Appointments Date Time Provider Whitney  02/20/2016 9:00 AM [facility] 6 MINUTE WALK [facility] None  02/20/2016 10:00 AM [facility] PFT ROOM [facility] None  02/20/2016 11:00 AM Javier Glazier, MD [facility] None  02/29/2016 1:45 PM Milinda Pointer, MD [facility] None   Primary Care Physician: No PCP Per Patient Location: [facility] Note by: Kathlen Brunswick. Dossie Arbour, M.D, DABA, DABAPM, DABPM, DABIPP, FIPP  Disclaimer:  Medicine is not an exact science. The only guarantee in medicine is that nothing is guaranteed. It is important to note that the decision to proceed with this intervention was based on the information collected from the patient. The Data and conclusions were drawn from the patient's questionnaire, the interview, and the physical examination. Because the information was provided in large part by the patient, it cannot be guaranteed that it has not been purposely or unconsciously manipulated. Every effort has been made to obtain as much relevant data as possible for this evaluation. It is important to note that the conclusions that lead to this procedure are derived in large part from the available data. Always take into account that the treatment will also be dependent on availability of resources and existing treatment guidelines, considered by other Pain Management Practitioners as being common knowledge and practice, at the time of the intervention. For Medico-Legal purposes, it is also important to point out that variation in procedural techniques and pharmacological choices are the acceptable norm. The indications, contraindications, technique, and results of the above procedure should only be interpreted and judged by a Board-Certified Interventional Pain Specialist with extensive familiarity and expertise in the same exact procedure and technique. Attempts at providing opinions without similar or greater experience and expertise than  that of the treating physician will be considered as inappropriate and unethical, and shall result in a formal complaint to the state medical board and applicable specialty societies.  Instructions provided at this appointment: Patient Instructions   Facet Joint Block The facet joints connect the bones of the spine (vertebrae). They make it possible for you to bend, twist, and make other movements with your spine. They  also prevent you from overbending, overtwisting, and making other excessive movements.  A facet joint block is a procedure where a numbing medicine (anesthetic) is injected into a facet joint. Often, a type of anti-inflammatory medicine called a steroid is also injected. A facet joint block may be done for two reasons:   Diagnosis. A facet joint block may be done as a test to see whether neck or back pain is caused by a worn-down or infected facet joint. If the pain gets better after a facet joint block, it means the pain is probably coming from the facet joint. If the pain does not get better, it means the pain is probably not coming from the facet joint.   Therapy. A facet joint block may be done to relieve neck or back pain caused by a facet joint. A facet joint block is only done as a therapy if the pain does not improve with medicine, exercise programs, physical therapy, and other forms of pain management. LET [facility] CARE PROVIDER KNOW ABOUT:   Any allergies you have.   All medicines you are taking, including vitamins, herbs, eyedrops, and over-the-counter medicines and creams.   Previous problems you or members of your family have had with the use of anesthetics.   Any blood disorders you have had.   Other health problems you have. RISKS AND COMPLICATIONS Generally, having a facet joint block is safe. However, as with any procedure, complications can occur. Possible complications associated with having a facet joint block include:   Bleeding.   Injury to a  nerve near the injection site.   Pain at the injection site.   Weakness or numbness in areas controlled by nerves near the injection site.   Infection.   Temporary fluid retention.   Allergic reaction to anesthetics or medicines used during the procedure. BEFORE THE PROCEDURE   Follow your health care provider's instructions if you are taking dietary supplements or medicines. You may need to stop taking them or reduce your dosage.   Do not take any new dietary supplements or medicines without asking your health care provider first.   Follow your health care provider's instructions about eating and drinking before the procedure. You may need to stop eating and drinking several hours before the procedure.   Arrange to have an adult drive you home after the procedure. PROCEDURE  You may need to remove your clothing and dress in an open-back gown so that your health care provider can access your spine.   The procedure will be done while you are lying on an X-ray table. Most of the time you will be asked to lie on your stomach, but you may be asked to lie in a different position if an injection will be made in your neck.   Special machines will be used to monitor your oxygen levels, heart rate, and blood pressure.   If an injection will be made in your neck, an intravenous (IV) tube will be inserted into one of your veins. Fluids and medicine will flow directly into your body through the IV tube.   The area over the facet joint where the injection will be made will be cleaned with an antiseptic soap. The surrounding skin will be covered with sterile drapes.   An anesthetic will be applied to your skin to make the injection area numb. You may feel a temporary stinging or burning sensation.   A video X-ray machine will be used to locate the joint. A contrast  dye may be injected into the facet joint area to help with locating the joint.   When the joint is located, an  anesthetic medicine will be injected into the joint through the needle.   Your health care provider will ask you whether you feel pain relief. If you do feel relief, a steroid may be injected to provide pain relief for a longer period of time. If you do not feel relief or feel only partial relief, additional injections of an anesthetic may be made in other facet joints.   The needle will be removed, the skin will be cleansed, and bandages will be applied.  AFTER THE PROCEDURE   You will be observed for 15-30 minutes before being allowed to go home. Do not drive. Have an adult drive you or take a taxi or public transportation instead.   If you feel pain relief, the pain will return in several hours or days when the anesthetic wears off.   You may feel pain relief 2-14 days after the procedure. The amount of time this relief lasts varies from person to person.   It is normal to feel some tenderness over the injected area(s) for 2 days following the procedure.   If you have diabetes, you may have a temporary increase in blood sugar.   This information is not intended to replace advice given to you by your health care provider. Make sure you discuss any questions you have with your health care provider.   Document Released: 09/11/2006 Document Revised: 05/13/2014 Document Reviewed: 02/10/2012 Elsevier Interactive Patient Education 2016 [place]  What are the risk, side effects and possible complications? Generally speaking, most procedures are safe.  However, with any procedure there are risks, side effects, and the possibility of complications.  The risks and complications are dependent upon the sites that are lesioned, or the type of nerve block to be performed.  The closer the procedure is to the spine, the more serious the risks are.  Great care is taken when placing the radio frequency needles, block needles or lesioning probes, but sometimes  complications can occur. 1. Infection: Any time there is an injection through the skin, there is a risk of infection.  This is why sterile conditions are used for these blocks.  There are four possible types of infection. 1. Localized skin infection. 2. Central Nervous System Infection-This can be in the form of Meningitis, which can be deadly. 3. Epidural Infections-This can be in the form of an epidural abscess, which can cause pressure inside of the spine, causing compression of the spinal cord with subsequent paralysis. This would require an emergency surgery to decompress, and there are no guarantees that the patient would recover from the paralysis. 4. Discitis-This is an infection of the intervertebral discs.  It occurs in about 1% of discography procedures.  It is difficult to treat and it may lead to surgery.        2. Pain: the needles have to go through skin and soft tissues, will cause soreness.       3. Damage to internal structures:  The nerves to be lesioned may be near blood vessels or    other nerves which can be potentially damaged.       4. Bleeding: Bleeding is more common if the patient is taking blood thinners such as  aspirin, Coumadin, Ticiid, Plavix, etc., or if he/she have some genetic predisposition  such as hemophilia. Bleeding into the spinal canal  can cause compression of the spinal  cord with subsequent paralysis.  This would require an emergency surgery to  decompress and there are no guarantees that the patient would recover from the  paralysis.       5. Pneumothorax:  Puncturing of a lung is a possibility, every time a needle is introduced in  the area of the chest or upper back.  Pneumothorax refers to free air around the  collapsed lung(s), inside of the thoracic cavity (chest cavity).  Another two possible  complications related to a similar event would include: Hemothorax and Chylothorax.   These are variations of the Pneumothorax, where instead of air around the  collapsed  lung(s), you may have blood or chyle, respectively.       6. Spinal headaches: They may occur with any procedures in the area of the spine.       7. Persistent CSF (Cerebro-Spinal Fluid) leakage: This is a rare problem, but may occur  with prolonged intrathecal or epidural catheters either due to the formation of a fistulous  track or a dural tear.       8. Nerve damage: By working so close to the spinal cord, there is always a possibility of  nerve damage, which could be as serious as a permanent spinal cord injury with  paralysis.       9. Death:  Although rare, severe deadly allergic reactions known as "Anaphylactic  reaction" can occur to any of the medications used.      10. Worsening of the symptoms:  We can always make thing worse.  What are the chances of something like this happening? Chances of any of this occuring are extremely low.  By statistics, you have more of a chance of getting killed in a motor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

## 2016-02-14 ENCOUNTER — Telehealth: Payer: Self-pay

## 2016-02-14 NOTE — Telephone Encounter (Signed)
Pt states she is ok. Instructed to call if needed

## 2016-02-20 ENCOUNTER — Encounter: Payer: Self-pay | Admitting: Pulmonary Disease

## 2016-02-20 ENCOUNTER — Ambulatory Visit (INDEPENDENT_AMBULATORY_CARE_PROVIDER_SITE_OTHER): Payer: Medicare Other | Admitting: Pulmonary Disease

## 2016-02-20 VITALS — BP 112/62 | HR 88 | Ht 64.0 in | Wt 118.0 lb

## 2016-02-20 DIAGNOSIS — J84115 Respiratory bronchiolitis interstitial lung disease: Secondary | ICD-10-CM

## 2016-02-20 DIAGNOSIS — R05 Cough: Secondary | ICD-10-CM

## 2016-02-20 DIAGNOSIS — R059 Cough, unspecified: Secondary | ICD-10-CM

## 2016-02-20 LAB — PULMONARY FUNCTION TEST
DL/VA % pred: 73 %
DL/VA: 3.52 ml/min/mmHg/L
DLCO UNC % PRED: 62 %
DLCO UNC: 15.18 ml/min/mmHg
DLCO cor % pred: 65 %
DLCO cor: 15.91 ml/min/mmHg
FEF 25-75 POST: 1.67 L/s
FEF 25-75 Pre: 1.43 L/sec
FEF2575-%Change-Post: 16 %
FEF2575-%PRED-POST: 68 %
FEF2575-%Pred-Pre: 58 %
FEV1-%CHANGE-POST: 4 %
FEV1-%PRED-POST: 78 %
FEV1-%Pred-Pre: 74 %
FEV1-POST: 2.05 L
FEV1-PRE: 1.96 L
FEV1FVC-%Change-Post: 3 %
FEV1FVC-%PRED-PRE: 92 %
FEV6-%Change-Post: 0 %
FEV6-%PRED-POST: 81 %
FEV6-%Pred-Pre: 81 %
FEV6-POST: 2.67 L
FEV6-PRE: 2.67 L
FEV6FVC-%Change-Post: 0 %
FEV6FVC-%PRED-POST: 103 %
FEV6FVC-%PRED-PRE: 103 %
FVC-%CHANGE-POST: 0 %
FVC-%PRED-PRE: 79 %
FVC-%Pred-Post: 79 %
FVC-POST: 2.68 L
FVC-PRE: 2.67 L
PRE FEV6/FVC RATIO: 100 %
Post FEV1/FVC ratio: 76 %
Post FEV6/FVC ratio: 100 %
Pre FEV1/FVC ratio: 73 %

## 2016-02-20 NOTE — Progress Notes (Signed)
Test reviewed.  
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
## 2016-02-20 NOTE — Addendum Note (Signed)
Addended by: Len Blalock on: 02/20/2016 11:27 AM   Modules accepted: Orders

## 2016-02-20 NOTE — Patient Instructions (Signed)
   Try using Guaifenesin 600mg  twice daily with plenty of water and your Flutter/Acapella Valve to help work up the mucus.   Try using the Symbicort inhaler I prescribed you previously to help with your cough & breathing. Remember to rinse, gargle & spite afterward to keep from getting thrush.  Let me know if you feel your cough or breathing is getting any worse.  I will see you back in 6 months or sooner if needed.  TESTS ORDERED: 1. Spirometry with bronchodilator challenge at follow-up appointment

## 2016-02-22 DIAGNOSIS — Z0000 Encounter for general adult medical examination without abnormal findings: Secondary | ICD-10-CM | POA: Diagnosis not present

## 2016-02-22 DIAGNOSIS — E079 Disorder of thyroid, unspecified: Secondary | ICD-10-CM | POA: Diagnosis not present

## 2016-02-22 DIAGNOSIS — I289 Disease of pulmonary vessels, unspecified: Secondary | ICD-10-CM | POA: Diagnosis not present

## 2016-02-22 DIAGNOSIS — R5383 Other fatigue: Secondary | ICD-10-CM | POA: Diagnosis not present

## 2016-02-22 DIAGNOSIS — J449 Chronic obstructive pulmonary disease, unspecified: Secondary | ICD-10-CM | POA: Diagnosis not present

## 2016-02-22 DIAGNOSIS — E7800 Pure hypercholesterolemia, unspecified: Secondary | ICD-10-CM | POA: Diagnosis not present

## 2016-02-22 DIAGNOSIS — G4700 Insomnia, unspecified: Secondary | ICD-10-CM | POA: Diagnosis not present

## 2016-02-22 DIAGNOSIS — E785 Hyperlipidemia, unspecified: Secondary | ICD-10-CM | POA: Diagnosis not present

## 2016-02-22 DIAGNOSIS — Z87891 Personal history of nicotine dependence: Secondary | ICD-10-CM | POA: Diagnosis not present

## 2016-02-22 DIAGNOSIS — R079 Chest pain, unspecified: Secondary | ICD-10-CM | POA: Diagnosis not present

## 2016-02-22 DIAGNOSIS — E559 Vitamin D deficiency, unspecified: Secondary | ICD-10-CM | POA: Diagnosis not present

## 2016-02-24 DIAGNOSIS — Z79899 Other long term (current) drug therapy: Secondary | ICD-10-CM | POA: Diagnosis not present

## 2016-02-26 ENCOUNTER — Other Ambulatory Visit: Payer: Self-pay | Admitting: *Deleted

## 2016-02-26 DIAGNOSIS — Z1231 Encounter for screening mammogram for malignant neoplasm of breast: Secondary | ICD-10-CM

## 2016-02-29 ENCOUNTER — Ambulatory Visit: Payer: Medicare Other | Attending: Pain Medicine | Admitting: Pain Medicine

## 2016-02-29 ENCOUNTER — Encounter: Payer: Self-pay | Admitting: Pain Medicine

## 2016-02-29 VITALS — BP 141/65 | HR 78 | Temp 98.2°F | Resp 16 | Ht 64.0 in | Wt 116.0 lb

## 2016-02-29 DIAGNOSIS — M1288 Other specific arthropathies, not elsewhere classified, other specified site: Secondary | ICD-10-CM | POA: Diagnosis not present

## 2016-02-29 DIAGNOSIS — Z79899 Other long term (current) drug therapy: Secondary | ICD-10-CM | POA: Diagnosis not present

## 2016-02-29 DIAGNOSIS — G4701 Insomnia due to medical condition: Secondary | ICD-10-CM | POA: Diagnosis not present

## 2016-02-29 DIAGNOSIS — M545 Low back pain: Secondary | ICD-10-CM | POA: Diagnosis not present

## 2016-02-29 DIAGNOSIS — Z90710 Acquired absence of both cervix and uterus: Secondary | ICD-10-CM | POA: Insufficient documentation

## 2016-02-29 DIAGNOSIS — M792 Neuralgia and neuritis, unspecified: Secondary | ICD-10-CM

## 2016-02-29 DIAGNOSIS — Z9889 Other specified postprocedural states: Secondary | ICD-10-CM | POA: Diagnosis not present

## 2016-02-29 DIAGNOSIS — M47816 Spondylosis without myelopathy or radiculopathy, lumbar region: Secondary | ICD-10-CM

## 2016-02-29 DIAGNOSIS — R252 Cramp and spasm: Secondary | ICD-10-CM | POA: Diagnosis not present

## 2016-02-29 DIAGNOSIS — F5104 Psychophysiologic insomnia: Secondary | ICD-10-CM | POA: Insufficient documentation

## 2016-02-29 DIAGNOSIS — M791 Myalgia: Secondary | ICD-10-CM

## 2016-02-29 DIAGNOSIS — G8929 Other chronic pain: Secondary | ICD-10-CM | POA: Insufficient documentation

## 2016-02-29 DIAGNOSIS — M7918 Myalgia, other site: Secondary | ICD-10-CM

## 2016-02-29 MED ORDER — ORPHENADRINE CITRATE ER 100 MG PO TB12: 100.0000 mg | ORAL_TABLET | Freq: Two times a day (BID) | ORAL | 0 refills | Status: DC | PRN

## 2016-02-29 MED ORDER — TRAZODONE HCL 100 MG PO TABS: 100.0000 mg | ORAL_TABLET | Freq: Every day | ORAL | 0 refills | Status: DC

## 2016-02-29 MED ORDER — GABAPENTIN 100 MG PO CAPS: 100.0000 mg | ORAL_CAPSULE | Freq: Every day | ORAL | 0 refills | Status: DC

## 2016-02-29 MED ORDER — HYDROCODONE-ACETAMINOPHEN 10-325 MG PO TABS: 1.0000 | ORAL_TABLET | Freq: Two times a day (BID) | ORAL | 0 refills | Status: DC

## 2016-02-29 NOTE — Patient Instructions (Addendum)
GENERAL RISKS AND COMPLICATIONS  What are the risk, side effects and possible complications? Generally speaking, most procedures are safe.  However, with any procedure there are risks, side effects, and the possibility of complications.  The risks and complications are dependent upon the sites that are lesioned, or the type of nerve block to be performed.  The closer the procedure is to the spine, the more serious the risks are.  Great care is taken when placing the radio frequency needles, block needles or lesioning probes, but sometimes complications can occur. 1. Infection: Any time there is an injection through the skin, there is a risk of infection.  This is why sterile conditions are used for these blocks.  There are four possible types of infection. 1. Localized skin infection. 2. Central Nervous System Infection-This can be in the form of Meningitis, which can be deadly. 3. Epidural Infections-This can be in the form of an epidural abscess, which can cause pressure inside of the spine, causing compression of the spinal cord with subsequent paralysis. This would require an emergency surgery to decompress, and there are no guarantees that the patient would recover from the paralysis. 4. Discitis-This is an infection of the intervertebral discs.  It occurs in about 1% of discography procedures.  It is difficult to treat and it may lead to surgery.        2. Pain: the needles have to go through skin and soft tissues, will cause soreness.       3. Damage to internal structures:  The nerves to be lesioned may be near blood vessels or    other nerves which can be potentially damaged.       4. Bleeding: Bleeding is more common if the patient is taking blood thinners such as  aspirin, Coumadin, Ticiid, Plavix, etc., or if he/she have some genetic predisposition  such as hemophilia. Bleeding into the spinal canal can cause compression of the spinal  cord with subsequent paralysis.  This would require an  emergency surgery to  decompress and there are no guarantees that the patient would recover from the  paralysis.       5. Pneumothorax:  Puncturing of a lung is a possibility, every time a needle is introduced in  the area of the chest or upper back.  Pneumothorax refers to free air around the  collapsed lung(s), inside of the thoracic cavity (chest cavity).  Another two possible  complications related to a similar event would include: Hemothorax and Chylothorax.   These are variations of the Pneumothorax, where instead of air around the collapsed  lung(s), you may have blood or chyle, respectively.       6. Spinal headaches: They may occur with any procedures in the area of the spine.       7. Persistent CSF (Cerebro-Spinal Fluid) leakage: This is a rare problem, but may occur  with prolonged intrathecal or epidural catheters either due to the formation of a fistulous  track or a dural tear.       8. Nerve damage: By working so close to the spinal cord, there is always a possibility of  nerve damage, which could be as serious as a permanent spinal cord injury with  paralysis.       9. Death:  Although rare, severe deadly allergic reactions known as "Anaphylactic  reaction" can occur to any of the medications used.      10. Worsening of the symptoms:  We can always make thing worse.    What are the chances of something like this happening? Chances of any of this occuring are extremely low.  By statistics, you have more of a chance of getting killed in a motor vehicle accident: while driving to the hospital than any of the above occurring .  Nevertheless, you should be aware that they are possibilities.  In general, it is similar to taking a shower.  Everybody knows that you can slip, hit your head and get killed.  Does that mean that you should not shower again?  Nevertheless always keep in mind that statistics do not mean anything if you happen to be on the wrong side of them.  Even if a procedure has a 1  (one) in a 1,000,000 (million) chance of going wrong, it you happen to be that one..Also, keep in mind that by statistics, you have more of a chance of having something go wrong when taking medications.  Who should not have this procedure? If you are on a blood thinning medication (e.g. Coumadin, Plavix, see list of "Blood Thinners"), or if you have an active infection going on, you should not have the procedure.  If you are taking any blood thinners, please inform your physician.  How should I prepare for this procedure?  Do not eat or drink anything at least six hours prior to the procedure.  Bring a driver with you .  It cannot be a taxi.  Come accompanied by an adult that can drive you back, and that is strong enough to help you if your legs get weak or numb from the local anesthetic.  Take all of your medicines the morning of the procedure with just enough water to swallow them.  If you have diabetes, make sure that you are scheduled to have your procedure done first thing in the morning, whenever possible.  If you have diabetes, take only half of your insulin dose and notify our nurse that you have done so as soon as you arrive at the clinic.  If you are diabetic, but only take blood sugar pills (oral hypoglycemic), then do not take them on the morning of your procedure.  You may take them after you have had the procedure.  Do not take aspirin or any aspirin-containing medications, at least eleven (11) days prior to the procedure.  They may prolong bleeding.  Wear loose fitting clothing that may be easy to take off and that you would not mind if it got stained with Betadine or blood.  Do not wear any jewelry or perfume  Remove any nail coloring.  It will interfere with some of our monitoring equipment.  NOTE: Remember that this is not meant to be interpreted as a complete list of all possible complications.  Unforeseen problems may occur.  BLOOD THINNERS The following drugs  contain aspirin or other products, which can cause increased bleeding during surgery and should not be taken for 2 weeks prior to and 1 week after surgery.  If you should need take something for relief of minor pain, you may take acetaminophen which is found in Tylenol,m Datril, Anacin-3 and Panadol. It is not blood thinner. The products listed below are.  Do not take any of the products listed below in addition to any listed on your instruction sheet.  A.P.C or A.P.C with Codeine Codeine Phosphate Capsules #3 Ibuprofen Ridaura  ABC compound Congesprin Imuran rimadil  Advil Cope Indocin Robaxisal  Alka-Seltzer Effervescent Pain Reliever and Antacid Coricidin or Coricidin-D  Indomethacin Rufen    Alka-Seltzer plus Cold Medicine Cosprin Ketoprofen S-A-C Tablets  Anacin Analgesic Tablets or Capsules Coumadin Korlgesic Salflex  Anacin Extra Strength Analgesic tablets or capsules CP-2 Tablets Lanoril Salicylate  Anaprox Cuprimine Capsules Levenox Salocol  Anexsia-D Dalteparin Magan Salsalate  Anodynos Darvon compound Magnesium Salicylate Sine-off  Ansaid Dasin Capsules Magsal Sodium Salicylate  Anturane Depen Capsules Marnal Soma  APF Arthritis pain formula Dewitt's Pills Measurin Stanback  Argesic Dia-Gesic Meclofenamic Sulfinpyrazone  Arthritis Bayer Timed Release Aspirin Diclofenac Meclomen Sulindac  Arthritis pain formula Anacin Dicumarol Medipren Supac  Analgesic (Safety coated) Arthralgen Diffunasal Mefanamic Suprofen  Arthritis Strength Bufferin Dihydrocodeine Mepro Compound Suprol  Arthropan liquid Dopirydamole Methcarbomol with Aspirin Synalgos  ASA tablets/Enseals Disalcid Micrainin Tagament  Ascriptin Doan's Midol Talwin  Ascriptin A/D Dolene Mobidin Tanderil  Ascriptin Extra Strength Dolobid Moblgesic Ticlid  Ascriptin with Codeine Doloprin or Doloprin with Codeine Momentum Tolectin  Asperbuf Duoprin Mono-gesic Trendar  Aspergum Duradyne Motrin or Motrin IB Triminicin  Aspirin  plain, buffered or enteric coated Durasal Myochrisine Trigesic  Aspirin Suppositories Easprin Nalfon Trillsate  Aspirin with Codeine Ecotrin Regular or Extra Strength Naprosyn Uracel  Atromid-S Efficin Naproxen Ursinus  Auranofin Capsules Elmiron Neocylate Vanquish  Axotal Emagrin Norgesic Verin  Azathioprine Empirin or Empirin with Codeine Normiflo Vitamin E  Azolid Emprazil Nuprin Voltaren  Bayer Aspirin plain, buffered or children's or timed BC Tablets or powders Encaprin Orgaran Warfarin Sodium  Buff-a-Comp Enoxaparin Orudis Zorpin  Buff-a-Comp with Codeine Equegesic Os-Cal-Gesic   Buffaprin Excedrin plain, buffered or Extra Strength Oxalid   Bufferin Arthritis Strength Feldene Oxphenbutazone   Bufferin plain or Extra Strength Feldene Capsules Oxycodone with Aspirin   Bufferin with Codeine Fenoprofen Fenoprofen Pabalate or Pabalate-SF   Buffets II Flogesic Panagesic   Buffinol plain or Extra Strength Florinal or Florinal with Codeine Panwarfarin   Buf-Tabs Flurbiprofen Penicillamine   Butalbital Compound Four-way cold tablets Penicillin   Butazolidin Fragmin Pepto-Bismol   Carbenicillin Geminisyn Percodan   Carna Arthritis Reliever Geopen Persantine   Carprofen Gold's salt Persistin   Chloramphenicol Goody's Phenylbutazone   Chloromycetin Haltrain Piroxlcam   Clmetidine heparin Plaquenil   Cllnoril Hyco-pap Ponstel   Clofibrate Hydroxy chloroquine Propoxyphen         Before stopping any of these medications, be sure to consult the physician who ordered them.  Some, such as Coumadin (Warfarin) are ordered to prevent or treat serious conditions such as "deep thrombosis", "pumonary embolisms", and other heart problems.  The amount of time that you may need off of the medication may also vary with the medication and the reason for which you were taking it.  If you are taking any of these medications, please make sure you notify your pain physician before you undergo any  procedures.         Facet Blocks Patient Information  Description: The facets are joints in the spine between the vertebrae.  Like any joints in the body, facets can become irritated and painful.  Arthritis can also effect the facets.  By injecting steroids and local anesthetic in and around these joints, we can temporarily block the nerve supply to them.  Steroids act directly on irritated nerves and tissues to reduce selling and inflammation which often leads to decreased pain.  Facet blocks may be done anywhere along the spine from the neck to the low back depending upon the location of your pain.   After numbing the skin with local anesthetic (like Novocaine), a small needle is passed onto the facet joints under x-ray guidance.    You may experience a sensation of pressure while this is being done.  The entire block usually lasts about 15-25 minutes.   Conditions which may be treated by facet blocks:   Low back/buttock pain  Neck/shoulder pain  Certain types of headaches  Preparation for the injection:  1. Do not eat any solid food or dairy products within 8 hours of your appointment. 2. You may drink clear liquid up to 3 hours before appointment.  Clear liquids include water, black coffee, juice or soda.  No milk or cream please. 3. You may take your regular medication, including pain medications, with a sip of water before your appointment.  Diabetics should hold regular insulin (if taken separately) and take 1/2 normal NPH dose the morning of the procedure.  Carry some sugar containing items with you to your appointment. 4. A driver must accompany you and be prepared to drive you home after your procedure. 5. Bring all your current medications with you. 6. An IV may be inserted and sedation may be given at the discretion of the physician. 7. A blood pressure cuff, EKG and other monitors will often be applied during the procedure.  Some patients may need to have extra oxygen  administered for a short period. 8. You will be asked to provide medical information, including your allergies and medications, prior to the procedure.  We must know immediately if you are taking blood thinners (like Coumadin/Warfarin) or if you are allergic to IV iodine contrast (dye).  We must know if you could possible be pregnant.  Possible side-effects:   Bleeding from needle site  Infection (rare, may require surgery)  Nerve injury (rare)  Numbness & tingling (temporary)  Difficulty urinating (rare, temporary)  Spinal headache (a headache worse with upright posture)  Light-headedness (temporary)  Pain at injection site (serveral days)  Decreased blood pressure (rare, temporary)  Weakness in arm/leg (temporary)  Pressure sensation in back/neck (temporary)   Call if you experience:   Fever/chills associated with headache or increased back/neck pain  Headache worsened by an upright position  New onset, weakness or numbness of an extremity below the injection site  Hives or difficulty breathing (go to the emergency room)  Inflammation or drainage at the injection site(s)  Severe back/neck pain greater than usual  New symptoms which are concerning to you  Please note:  Although the local anesthetic injected can often make your back or neck feel good for several hours after the injection, the pain will likely return. It takes 3-7 days for steroids to work.  You may not notice any pain relief for at least one week.  If effective, we will often do a series of 2-3 injections spaced 3-6 weeks apart to maximally decrease your pain.  After the initial series, you may be a candidate for a more permanent nerve block of the facets.  If you have any questions, please call [phone] [facility] 

## 2016-02-29 NOTE — Progress Notes (Signed)
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 discography procedures.  It is difficult to treat and it may lead to surgery.        2. Pain: the needles have to go through skin and soft tissues, will cause soreness.       3. Damage to internal structures:  The nerves to be lesioned may be near blood vessels or    other nerves which can be potentially damaged.       4. Bleeding: Bleeding is more common if the patient is taking blood thinners such as  aspirin, Coumadin, Ticiid, Plavix, etc., or if he/she have some genetic predisposition  such as hemophilia. Bleeding into the spinal canal can cause compression of the spinal  cord with subsequent paralysis.  This would require an emergency surgery to  decompress and there are no guarantees that the patient would recover from the  paralysis.       5. Pneumothorax:  Puncturing of a lung is a possibility, every time a needle is introduced in  the area of the chest or  upper back.  Pneumothorax refers to free air around the  collapsed lung(s), inside of the thoracic cavity (chest cavity).  Another two possible  complications related to a similar event would include: Hemothorax and Chylothorax.   These are variations of the Pneumothorax, where instead of air around the collapsed  lung(s), you may have blood or chyle, respectively.       6. Spinal headaches: They may occur with any procedures in the area of the spine.       7. Persistent CSF (Cerebro-Spinal Fluid) leakage: This is a rare problem, but may occur  with prolonged intrathecal or epidural catheters either due to the formation of a fistulous  track or a dural tear.       8. Nerve damage: By working so close to the spinal cord, there is always a possibility of  nerve damage, which could be as serious as a permanent spinal cord injury with  paralysis.       9. Death:  Although rare, severe deadly allergic reactions known as "Anaphylactic  reaction" can occur to any of the medications used.      10. Worsening of the symptoms:  We can always make thing worse.  What are the chances of something like this happening? Chances of any of this occuring are extremely low.  By statistics, you have more of a chance of getting killed in a motor vehicle accident: while driving to the hospital than any of the above occurring .  Nevertheless, you should be aware that they are possibilities.  In general, it is similar to taking a shower.  Everybody knows that you can slip, hit your head and get killed.  Does that mean that you should not shower again?  Nevertheless always keep in mind that statistics do not mean anything if you happen to be on the wrong side of them.  Even if a procedure has a 1 (one) in a 1,000,000 (million) chance of going wrong, it you happen to be that one..Also, keep in mind that by statistics, you have more of a chance of having something go wrong when taking medications.  Who should not have this  procedure? If you are on a blood thinning medication (e.g. Coumadin, Plavix, see list of "Blood Thinners"), or if you have an active infection going on, you should not have the procedure.  If you are taking any blood thinners, please inform your physician.  How should I prepare for this procedure?  Do not eat or drink anything at least six hours prior to the procedure.  Bring a driver with you .  It cannot be a taxi.  Come accompanied by an adult that can drive you back, and that is strong enough to help you if your legs get weak or numb from the local anesthetic.  Take all of your medicines the morning of the procedure with just enough water to swallow them.  If you have diabetes, make sure that you are scheduled to have your procedure done first thing in the morning, whenever possible.  If you have diabetes, take only half of your insulin dose and notify our nurse that you have done so as soon as you arrive at the clinic.  If you are diabetic, but only take blood sugar pills (oral hypoglycemic), then do not  take them on the morning of your procedure.  You may take them after you have had the procedure.  Do not take aspirin or any aspirin-containing medications, at least eleven (11) days prior to the procedure.  They may prolong bleeding.  Wear loose fitting clothing that may be easy to take off and that you would not mind if it got stained with Betadine or blood.  Do not wear any jewelry or perfume  Remove any nail coloring.  It will interfere with some of our monitoring equipment.  NOTE: Remember that this is not meant to be interpreted as a complete list of all possible complications.  Unforeseen problems may occur.  BLOOD THINNERS The following drugs contain aspirin or other products, which can cause increased bleeding during surgery and should not be taken for 2 weeks prior to and 1 week after surgery.  If you should need take something for relief of minor pain, you may take  acetaminophen which is found in Tylenol,m Datril, Anacin-3 and Panadol. It is not blood thinner. The products listed below are.  Do not take any of the products listed below in addition to any listed on your instruction sheet.  A.P.C or A.P.C with Codeine Codeine Phosphate Capsules #3 Ibuprofen Ridaura  ABC compound Congesprin Imuran rimadil  Advil Cope Indocin Robaxisal  Alka-Seltzer Effervescent Pain Reliever and Antacid Coricidin or Coricidin-D  Indomethacin Rufen  Alka-Seltzer plus Cold Medicine Cosprin Ketoprofen S-A-C Tablets  Anacin Analgesic Tablets or Capsules Coumadin Korlgesic Salflex  Anacin Extra Strength Analgesic tablets or capsules CP-2 Tablets Lanoril Salicylate  Anaprox Cuprimine Capsules Levenox Salocol  Anexsia-D Dalteparin Magan Salsalate  Anodynos Darvon compound Magnesium Salicylate Sine-off  Ansaid Dasin Capsules Magsal Sodium Salicylate  Anturane Depen Capsules Marnal Soma  APF Arthritis pain formula Dewitt's Pills Measurin Stanback  Argesic Dia-Gesic Meclofenamic Sulfinpyrazone  Arthritis Bayer Timed Release Aspirin Diclofenac Meclomen Sulindac  Arthritis pain formula Anacin Dicumarol Medipren Supac  Analgesic (Safety coated) Arthralgen Diffunasal Mefanamic Suprofen  Arthritis Strength Bufferin Dihydrocodeine Mepro Compound Suprol  Arthropan liquid Dopirydamole Methcarbomol with Aspirin Synalgos  ASA tablets/Enseals Disalcid Micrainin Tagament  Ascriptin Doan's Midol Talwin  Ascriptin A/D Dolene Mobidin Tanderil  Ascriptin Extra Strength Dolobid Moblgesic Ticlid  Ascriptin with Codeine Doloprin or Doloprin with Codeine Momentum Tolectin  Asperbuf Duoprin Mono-gesic Trendar  Aspergum Duradyne Motrin or Motrin IB Triminicin  Aspirin plain, buffered or enteric coated Durasal Myochrisine Trigesic  Aspirin Suppositories Easprin Nalfon Trillsate  Aspirin with Codeine Ecotrin Regular or Extra Strength Naprosyn Uracel  Atromid-S Efficin Naproxen Ursinus  Auranofin  Capsules Elmiron Neocylate Vanquish  Axotal Emagrin Norgesic Verin  Azathioprine Empirin or Empirin with Codeine Normiflo Vitamin E  Azolid Emprazil Nuprin Voltaren  Bayer Aspirin plain, buffered or children's or timed BC Tablets or powders Encaprin Orgaran Warfarin Sodium  Buff-a-Comp Enoxaparin Orudis Zorpin  Buff-a-Comp with Codeine Equegesic Os-Cal-Gesic   Buffaprin Excedrin plain, buffered or Extra Strength Oxalid   Bufferin Arthritis Strength Feldene Oxphenbutazone   Bufferin plain or Extra Strength Feldene Capsules Oxycodone with Aspirin   Bufferin with Codeine Fenoprofen Fenoprofen Pabalate or Pabalate-SF   Buffets II Flogesic Panagesic   Buffinol plain or Extra Strength Florinal or Florinal with Codeine Panwarfarin   Buf-Tabs Flurbiprofen Penicillamine   Butalbital Compound Four-way cold tablets Penicillin   Butazolidin Fragmin Pepto-Bismol   Carbenicillin Geminisyn Percodan   Carna Arthritis Reliever Geopen Persantine   Carprofen Gold's salt Persistin   Chloramphenicol Goody's Phenylbutazone   Chloromycetin Haltrain Piroxlcam   Clmetidine heparin Plaquenil  Cllnoril Hyco-pap Ponstel   Clofibrate Hydroxy chloroquine Propoxyphen         Before stopping any of these medications, be sure to consult the physician who ordered them.  Some, such as Coumadin (Warfarin) are ordered to prevent or treat serious conditions such as "deep thrombosis", "pumonary embolisms", and other heart problems.  The amount of time that you may need off of the medication may also vary with the medication and the reason for which you were taking it.  If you are taking any of these medications, please make sure you notify your pain physician before you undergo any procedures.         Facet Blocks Patient Information  Description: The facets are joints in the spine between the vertebrae.  Like any joints in the body, facets can become irritated and painful.  Arthritis can also effect the facets.  By  injecting steroids and local anesthetic in and around these joints, we can temporarily block the nerve supply to them.  Steroids act directly on irritated nerves and tissues to reduce selling and inflammation which often leads to decreased pain.  Facet blocks may be done anywhere along the spine from the neck to the low back depending upon the location of your pain.   After numbing the skin with local anesthetic (like Novocaine), a small needle is passed onto the facet joints under x-ray guidance.  You may experience a sensation of pressure while this is being done.  The entire block usually lasts about 15-25 minutes.   Conditions which may be treated by facet blocks:   Low back/buttock pain  Neck/shoulder pain  Certain types of headaches  Preparation for the injection:  1. Do not eat any solid food or dairy products within 8 hours of your appointment. 2. You may drink clear liquid up to 3 hours before appointment.  Clear liquids include water, black coffee, juice or soda.  No milk or cream please. 3. You may take your regular medication, including pain medications, with a sip of water before your appointment.  Diabetics should hold regular insulin (if taken separately) and take 1/2 normal NPH dose the morning of the procedure.  Carry some sugar containing items with you to your appointment. 4. A driver must accompany you and be prepared to drive you home after your procedure. 5. Bring all your current medications with you. 6. An IV may be inserted and sedation may be given at the discretion of the physician. 7. A blood pressure cuff, EKG and other monitors will often be applied during the procedure.  Some patients may need to have extra oxygen administered for a short period. 8. You will be asked to provide medical information, including your allergies and medications, prior to the procedure.  We must know immediately if you are taking blood thinners (like Coumadin/Warfarin) or if you are  allergic to IV iodine contrast (dye).  We must know if you could possible be pregnant.  Possible side-effects:   Bleeding from needle site  Infection (rare, may require surgery)  Nerve injury (rare)  Numbness & tingling (temporary)  Difficulty urinating (rare, temporary)  Spinal headache (a headache worse with upright posture)  Light-headedness (temporary)  Pain at injection site (serveral days)  Decreased blood pressure (rare, temporary)  Weakness in arm/leg (temporary)  Pressure sensation in back/neck (temporary)   Call if you experience:   Fever/chills associated with headache or increased back/neck pain  Headache worsened by an upright position  New onset, weakness or numbness  of an extremity below the injection site  Hives or difficulty breathing (go to the emergency room)  Inflammation or drainage at the injection site(s)  Severe back/neck pain greater than usual  New symptoms which are concerning to you  Please note:  Although the local anesthetic injected can often make your back or neck feel good for several hours after the injection, the pain will likely return. It takes 3-7 days for steroids to work.  You may not notice any pain relief for at least one week.  If effective, we will often do a series of 2-3 injections spaced 3-6 weeks apart to maximally decrease your pain.  After the initial series, you may be a candidate for a more permanent nerve block of the facets.  If you have any questions, please call #[phone]) [facility]

## 2016-02-29 NOTE — Progress Notes (Signed)
Nursing Pain Medication Assessment:  Safety precautions to be maintained throughout the outpatient stay will include: orient to surroundings, keep bed in low position, maintain call bell within reach at all times, provide assistance with transfer out of bed and ambulation.  Medication Inspection Compliance: Paula Duncan did not comply with our request to bring her pills to be counted. She was reminded that bringing the medication bottles, even when empty, is a requirement. Pill Count: No pills available to be counted today. Bottle Appearance: No container available. Did not bring bottle(s) to appointment. Medication: See above Filled Date: N/A Medication last intake: 02/29/2016

## 2016-03-06 DIAGNOSIS — M810 Age-related osteoporosis without current pathological fracture: Secondary | ICD-10-CM | POA: Diagnosis not present

## 2016-03-06 DIAGNOSIS — Z5320 Procedure and treatment not carried out because of patient's decision for unspecified reasons: Secondary | ICD-10-CM | POA: Diagnosis not present

## 2016-03-06 DIAGNOSIS — R109 Unspecified abdominal pain: Secondary | ICD-10-CM | POA: Diagnosis not present

## 2016-03-13 ENCOUNTER — Ambulatory Visit: Payer: Medicare Other

## 2016-03-13 ENCOUNTER — Ambulatory Visit
Admission: RE | Admit: 2016-03-13 | Discharge: 2016-03-13 | Disposition: A | Discharge status: Home / Self Care | Payer: Medicare Other | Source: Ambulatory Visit | Attending: Pain Medicine | Admitting: Pain Medicine

## 2016-03-13 ENCOUNTER — Ambulatory Visit (HOSPITAL_BASED_OUTPATIENT_CLINIC_OR_DEPARTMENT_OTHER): Payer: Medicare Other | Admitting: Pain Medicine

## 2016-03-13 ENCOUNTER — Ambulatory Visit: Payer: Medicare Other | Admitting: Pain Medicine

## 2016-03-13 ENCOUNTER — Encounter: Payer: Self-pay | Admitting: Pain Medicine

## 2016-03-13 VITALS — BP 148/67 | HR 82 | Temp 97.6°F | Resp 11 | Ht 64.0 in | Wt 160.0 lb

## 2016-03-13 DIAGNOSIS — M47816 Spondylosis without myelopathy or radiculopathy, lumbar region: Secondary | ICD-10-CM

## 2016-03-13 DIAGNOSIS — G8929 Other chronic pain: Secondary | ICD-10-CM

## 2016-03-13 DIAGNOSIS — M545 Low back pain: Secondary | ICD-10-CM | POA: Insufficient documentation

## 2016-03-13 DIAGNOSIS — M1288 Other specific arthropathies, not elsewhere classified, other specified site: Secondary | ICD-10-CM

## 2016-03-13 DIAGNOSIS — M47896 Other spondylosis, lumbar region: Secondary | ICD-10-CM | POA: Diagnosis not present

## 2016-03-13 DIAGNOSIS — M5380 Other specified dorsopathies, site unspecified: Secondary | ICD-10-CM | POA: Diagnosis not present

## 2016-03-13 MED ORDER — TRIAMCINOLONE ACETONIDE 40 MG/ML IJ SUSP
INTRAMUSCULAR | Status: AC
  Administered 2016-03-13: 40 mg
  Filled 2016-03-13: qty 2

## 2016-03-13 MED ORDER — ROPIVACAINE HCL 2 MG/ML IJ SOLN
9.0000 mL | Freq: Once | INTRAMUSCULAR | Status: AC
  Administered 2016-03-13: 9 mL
  Filled 2016-03-13: qty 10

## 2016-03-13 MED ORDER — ROPIVACAINE HCL 2 MG/ML IJ SOLN
INTRAMUSCULAR | Status: AC
  Administered 2016-03-13: 9 mL
  Filled 2016-03-13: qty 10

## 2016-03-13 MED ORDER — FENTANYL CITRATE (PF) 100 MCG/2ML IJ SOLN
INTRAMUSCULAR | Status: AC
  Administered 2016-03-13: 50 ug via INTRAVENOUS
  Filled 2016-03-13: qty 2

## 2016-03-13 MED ORDER — LACTATED RINGERS IV SOLN: 1000.0000 mL | Freq: Once | INTRAVENOUS | Status: DC

## 2016-03-13 MED ORDER — TRIAMCINOLONE ACETONIDE 40 MG/ML IJ SUSP
40.0000 mg | Freq: Once | INTRAMUSCULAR | Status: AC
  Administered 2016-03-13: 40 mg

## 2016-03-13 MED ORDER — FENTANYL CITRATE (PF) 100 MCG/2ML IJ SOLN
25.0000 ug | INTRAMUSCULAR | Status: DC | PRN
  Administered 2016-03-13: 50 ug via INTRAVENOUS
  Filled 2016-03-13: qty 2

## 2016-03-13 MED ORDER — MIDAZOLAM HCL 5 MG/5ML IJ SOLN
INTRAMUSCULAR | Status: AC
  Administered 2016-03-13: 2 mg via INTRAVENOUS
  Filled 2016-03-13: qty 5

## 2016-03-13 MED ORDER — LIDOCAINE HCL (PF) 1 % IJ SOLN
10.0000 mL | Freq: Once | INTRAMUSCULAR | Status: AC
  Administered 2016-03-13: 10 mL

## 2016-03-13 MED ORDER — MIDAZOLAM HCL 5 MG/5ML IJ SOLN
1.0000 mg | INTRAMUSCULAR | Status: DC | PRN
  Administered 2016-03-13: 2 mg via INTRAVENOUS
  Filled 2016-03-13: qty 5

## 2016-03-13 MED ORDER — TRIAMCINOLONE ACETONIDE 40 MG/ML IJ SUSP
40.0000 mg | Freq: Once | INTRAMUSCULAR | Status: AC
  Administered 2016-03-13: 40 mg
  Filled 2016-03-13: qty 1

## 2016-03-13 NOTE — Progress Notes (Signed)
Patient's Name: Paula Duncan  MRN: [id number]  Referring Provider: Milinda Pointer, MD  DOB: 1960-08-02  PCP: Vista Mink, FNP  DOS: 03/13/2016  Note by: Kathlen Brunswick. Dossie Arbour, MD  Service setting: Ambulatory outpatient  Location: [facility] (AMB) Pain Management Facility  Visit type: Procedure  Specialty: Interventional Pain Management  Patient type: Established   Primary Reason for Visit: Interventional Pain Management Treatment. CC: Back Pain (lower)  Procedure:  Anesthesia, Analgesia, Anxiolysis:  Type: Diagnostic Medial Branch Facet Block #2 Region: Lumbar Level: L2, L3, L4, L5, & S1 Medial Branch Level(s) Laterality: Bilateral  Type: Local Anesthesia with Moderate (Conscious) Sedation Local Anesthetic: Lidocaine 1% Route: Intravenous (IV) IV Access: Secured Sedation: Meaningful verbal contact was maintained at all times during the procedure  Indication(s): Analgesia and Anxiety  Indications: 1. Lumbar spondylosis   2. Lumbar facet syndrome (Location of Primary Source of Pain) (Bilateral) (R>L)   3. Chronic low back pain (Location of Primary Source of Pain) (Bilateral) (R>L)    Pain Score: Pre-procedure: 5 /10 Post-procedure: 0-No pain/10 over treated area.  Pre-Procedure Assessment:  Paula Duncan is a 55 y.o. (year old), female patient, seen today for interventional treatment. She  has a past surgical history that includes Spine surgery (1994); Abdominal hysterectomy; Exploratory laparotomy; Cesarean section; Esophagogastroduodenoscopy (07/12/09); and left heart catheterization with coronary angiogram (N/A, 04/06/2013).. Her primarily concern today is the Back Pain (lower) The primary encounter diagnosis was Lumbar spondylosis. Diagnoses of Lumbar facet syndrome (Location of Primary Source of Pain) (Bilateral) (R>L) and Chronic low back pain (Location of Primary Source of Pain) (Bilateral) (R>L) were also pertinent to this visit.  Pain Type:  (she is having additional pain in the  lower back.) Pain Location: Back Pain Orientation: Lower Pain Descriptors / Indicators: Aching, Constant Pain Frequency: Constant  Date of Last Visit: 02/29/16 Service Provided on Last Visit: Med Refill  Coagulation Parameters Lab Results  Component Value Date   INR 1.0 04/02/2013   LABPROT 10.7 04/02/2013   APTT  11/05/2008    37        IF BASELINE aPTT IS ELEVATED, SUGGEST PATIENT RISK ASSESSMENT BE USED TO DETERMINE APPROPRIATE ANTICOAGULANT THERAPY.   PLT 225 07/07/2014   Verification of the correct person, correct site (including marking of site), and correct procedure were performed and confirmed by the patient.  Consent: Before the procedure and under the influence of no sedative(s), amnesic(s), or anxiolytics, the patient was informed of the treatment options, risks and possible complications. To fulfill our ethical and legal obligations, as recommended by the American Medical Association's Code of Ethics, I have informed the patient of my clinical impression; the nature and purpose of the treatment or procedure; the risks, benefits, and possible complications of the intervention; the alternatives, including doing nothing; the risk(s) and benefit(s) of the alternative treatment(s) or procedure(s); and the risk(s) and benefit(s) of doing nothing. The patient was provided information about the general risks and possible complications associated with the procedure. These may include, but are not limited to: failure to achieve desired goals, infection, bleeding, organ or nerve damage, allergic reactions, paralysis, and death. In addition, the patient was informed of those risks and complications associated to Spine-related procedures, such as failure to decrease pain; infection (i.e.: Meningitis, epidural or intraspinal abscess); bleeding (i.e.: epidural hematoma, subarachnoid hemorrhage, or any other type of intraspinal or peri-dural bleeding); organ or nerve damage (i.e.: Any type of  peripheral nerve, nerve root, or spinal cord injury) with subsequent damage to sensory, motor, and/or autonomic  systems, resulting in permanent pain, numbness, and/or weakness of one or several areas of the body; allergic reactions; (i.e.: anaphylactic reaction); and/or death. Furthermore, the patient was informed of those risks and complications associated with the medications. These include, but are not limited to: allergic reactions (i.e.: anaphylactic or anaphylactoid reaction(s)); adrenal axis suppression; blood sugar elevation that in diabetics may result in ketoacidosis or comma; water retention that in patients with history of congestive heart failure may result in shortness of breath, pulmonary edema, and decompensation with resultant heart failure; weight gain; swelling or edema; medication-induced neural toxicity; particulate matter embolism and blood vessel occlusion with resultant organ, and/or nervous system infarction; and/or aseptic necrosis of one or more joints. Finally, the patient was informed that Medicine is not an exact science; therefore, there is also the possibility of unforeseen or unpredictable risks and/or possible complications that may result in a catastrophic outcome. The patient indicated having understood very clearly. We have given the patient no guarantees and we have made no promises. Enough time was given to the patient to ask questions, all of which were answered to the patient's satisfaction. Paula Duncan has indicated that she wanted to continue with the procedure.  Consent Attestation: I, the ordering provider, attest that I have discussed with the patient the benefits, risks, side-effects, alternatives, likelihood of achieving goals, and potential problems during recovery for the procedure that I have provided informed consent.  Pre-Procedure Preparation:  Safety Precautions: Allergies reviewed. The patient was asked about blood thinners, or active infections, both of  which were denied. The patient was asked to confirm the procedure and laterality, before marking the site, and again before commencing the procedure. Appropriate site, procedure, and patient were confirmed by following the Joint Commission's Universal Protocol (UP.01.01.01), in the form of a "Time Out". The patient was asked to participate by confirming the accuracy of the "Time Out" information. Patient was assessed for positional comfort and pressure points before starting the procedure. Allergies: She is allergic to nsaids; tramadol hcl; chantix [varenicline]; and metronidazole. Allergy Precautions: None required Infection Control Precautions: Sterile technique used. Standard Universal Precautions were taken as recommended by the Department of [facility] for Disease Control and Prevention (CDC). Standard pre-surgical skin prep was conducted. Respiratory hygiene and cough etiquette was practiced. Hand hygiene observed. Safe injection practices and needle disposal techniques followed. SDV (single dose vial) medications used. Medications properly checked for expiration dates and contaminants. Personal protective equipment (PPE) used as per protocol. Monitoring:  As per clinic protocol. Vitals:   03/13/16 1041 03/13/16 1049 03/13/16 1059 03/13/16 1109  BP: 95/76 131/62 132/63 (!) 148/67  Pulse: 83 89 74 82  Resp: (!) 21 12 12 11   Temp:    97.6 F (36.4 C)  TempSrc:      SpO2: 100% 98% 100% 99%  Weight:      Height:      Calculated BMI: Body mass index is 27.46 kg/m. Time-out: "Time-out" completed before starting procedure, as per protocol.  Description of Procedure Process:   Time-out: "Time-out" completed before starting procedure, as per protocol. Position: Prone Target Area: For Lumbar Facet blocks, the target is the groove formed by the junction of the transverse process and superior articular process. For the L5 dorsal ramus, the target is the notch between superior articular  process and sacral ala. For the S1 dorsal ramus, the target is the superior and lateral edge of the posterior S1 Sacral foramen. Approach: Paramedial approach. Area Prepped: Entire Posterior Lumbosacral Region  Prepping solution: ChloraPrep (2% chlorhexidine gluconate and 70% isopropyl alcohol) Safety Precautions: Aspiration looking for blood return was conducted prior to all injections. At no point did we inject any substances, as a needle was being advanced. No attempts were made at seeking any paresthesias. Safe injection practices and needle disposal techniques used. Medications properly checked for expiration dates. SDV (single dose vial) medications used. Description of the Procedure: Protocol guidelines were followed. The patient was placed in position over the fluoroscopy table. The target area was identified and the area prepped in the usual manner. Skin desensitized using vapocoolant spray. Skin & deeper tissues infiltrated with local anesthetic. Appropriate amount of time allowed to pass for local anesthetics to take effect. The procedure needle was introduced through the skin, ipsilateral to the reported pain, and advanced to the target area. Employing the "Medial Branch Technique", the needles were advanced to the angle made by the superior and medial portion of the transverse process, and the lateral and inferior portion of the superior articulating process of the targeted vertebral bodies. This area is known as "Burton's Eye" or the "Eye of the [place] Dog". A procedure needle was introduced through the skin, and this time advanced to the angle made by the superior and medial border of the sacral ala, and the lateral border of the S1 vertebral body. This last needle was later repositioned at the superior and lateral border of the posterior S1 foramen. Negative aspiration confirmed. Solution injected in intermittent fashion, asking for systemic symptoms every 0.5cc of injectate. The needles were  then removed and the area cleansed, making sure to leave some of the prepping solution back to take advantage of its long term bactericidal properties. EBL: None Materials & Medications Used:  Needle(s) Used: 22g - 3.5" Spinal Needle(s)   Illustration of the posterior view of the lumbar spine and the posterior neural structures. Laminae of L2 through S1 are labeled. DPRL5, dorsal primary ramus of L5; DPRS1, dorsal primary ramus of S1; DPR3, dorsal primary ramus of L3; FJ, facet (zygapophyseal) joint L3-L4; I, inferior articular process of L4; LB1, lateral branch of dorsal primary ramus of L1; IAB, inferior articular branches from L3 medial branch (supplies L4-L5 facet joint); IBP, intermediate branch plexus; MB3, medial branch of dorsal primary ramus of L3; NR3, third lumbar nerve root; S, superior articular process of L5; SAB, superior articular branches from L4 (supplies L4-5 facet joint also); TP3, transverse process of L3.  Imaging Guidance (Spinal):  Type of Imaging Technique: Fluoroscopy Guidance (Spinal) Indication(s): Assistance in needle guidance and placement for procedures requiring needle placement in or near specific anatomical locations not easily accessible without such assistance. Exposure Time: Please see nurses notes. Contrast: None used. Fluoroscopic Guidance: I was personally present during the use of fluoroscopy. "Tunnel Vision Technique" used to obtain the best possible view of the target area. Parallax error corrected before commencing the procedure. "Direction-depth-direction" technique used to introduce the needle under continuous pulsed fluoroscopy. Once target was reached, antero-posterior, oblique, and lateral fluoroscopic projection used confirm needle placement in all planes. Images permanently stored in EMR. Interpretation: No contrast injected. I personally interpreted the imaging intraoperatively. Adequate needle placement confirmed in multiple planes. Permanent images  saved into the patient's record.  Antibiotic Prophylaxis:  Indication(s): No indications identified. Type:  Antibiotics Given (last 72 hours)    None      Post-operative Assessment:  Complications: No immediate post-treatment complications observed by team, or reported by patient. Disposition: The patient tolerated the entire procedure well.  A repeat set of vitals were taken after the procedure and the patient was kept under observation following institutional policy, for this type of procedure. Post-procedural neurological assessment was performed, showing return to baseline, prior to discharge. The patient was provided with post-procedure discharge instructions, including a section on how to identify potential problems. Should any problems arise concerning this procedure, the patient was given instructions to immediately contact us, at any time, without hesitation. In any case, we plan to contact the patient by telephone for a follow-up status report regarding this interventional procedure. Comments:  No additional relevant information.  Plan of Care  Discharge to: Discharge home  Medications ordered for procedure: Meds ordered this encounter  Medications  . fentaNYL (SUBLIMAZE) injection 25-50 mcg    Make sure Narcan is available in the pyxis when using this medication. In the event of respiratory depression (RR< 8/min): Titrate NARCAN (naloxone) in increments of 0.1 to 0.2 mg IV at 2-3 minute intervals, until desired degree of reversal.  . lactated ringers infusion 1,000 mL  . midazolam (VERSED) 5 MG/5ML injection 1-2 mg    Make sure Flumazenil is available in the pyxis when using this medication. If oversedation occurs, administer 0.2 mg IV over 15 sec. If after 45 sec no response, administer 0.2 mg again over 1 min; may repeat at 1 min intervals; not to exceed 4 doses (1 mg)  . triamcinolone acetonide (KENALOG-40) injection 40 mg  . lidocaine (PF) (XYLOCAINE) 1 % injection 10 mL  .  ropivacaine (PF) 2 mg/ml (0.2%) (NAROPIN) epidural 9 mL  . triamcinolone acetonide (KENALOG-40) injection 40 mg  . ropivacaine (PF) 2 mg/ml (0.2%) (NAROPIN) epidural 9 mL  . ropivacaine (PF) 2 mg/ml (0.2%) (NAROPIN) 2 MG/ML epidural    [place], Patti: cabinet override  . fentaNYL (SUBLIMAZE) 100 MCG/2ML injection    [place], Patti: cabinet override  . midazolam (VERSED) 5 MG/5ML injection    [place]ne [place], Patti: cabinet override  . triamcinolone acetonide (KENALOG-40) 40 MG/ML injection    [place], Patti: cabinet override   Medications administered: (For more details, see medical record) We administered fentaNYL, midazolam, triamcinolone acetonide, lidocaine (PF), ropivacaine (PF) 2 mg/ml (0.2%), triamcinolone acetonide, ropivacaine (PF) 2 mg/ml (0.2%), ropivacaine (PF) 2 mg/ml (0.2%), fentaNYL, midazolam, and triamcinolone acetonide. Lab-work, Procedure(s), & Referral(s) Ordered: Orders Placed This Encounter  Procedures  . DG C-Arm 1-60 Min-No Report   Imaging Ordered: Results for orders placed in visit on 02/13/16  DG C-Arm 1-60 Min-No Report   Narrative CLINICAL DATA: Assistance in needle guidance and placement for procedures  requiring needle placement in or near specific anatomical locations not  easily accessible without such assistance.   C-ARM 1-60 MINUTES  Fluoroscopy was utilized by the requesting physician.  No radiographic  interpretation.     New Prescriptions   No medications on file   Physician-requested Follow-up:  Return in about 2 weeks (around 03/27/2016) for Post-Procedure evaluation.  Future Appointments Date Time Provider [place]  05/08/2016 1:15 PM Milinda Pointer, MD [facility] None   Primary Care Physician: Vista Mink, FNP Location: [facility] Note by: Kathlen Brunswick. Dossie Arbour, M.D, DABA, DABAPM, DABPM, DABIPP, FIPP  Disclaimer:  Medicine is not an exact science. The only guarantee in medicine is that  nothing is guaranteed. It is important to note that the decision to proceed with this intervention was based on the information collected from the patient. The Data and conclusions were drawn from the patient's questionnaire, the interview, and the physical examination. Because  the information was provided in large part by the patient, it cannot be guaranteed that it has not been purposely or unconsciously manipulated. Every effort has been made to obtain as much relevant data as possible for this evaluation. It is important to note that the conclusions that lead to this procedure are derived in large part from the available data. Always take into account that the treatment will also be dependent on availability of resources and existing treatment guidelines, considered by other Pain Management Practitioners as being common knowledge and practice, at the time of the intervention. For Medico-Legal purposes, it is also important to point out that variation in procedural techniques and pharmacological choices are the acceptable norm. The indications, contraindications, technique, and results of the above procedure should only be interpreted and judged by a Board-Certified Interventional Pain Specialist with extensive familiarity and expertise in the same exact procedure and technique. Attempts at providing opinions without similar or greater experience and expertise than that of the treating physician will be considered as inappropriate and unethical, and shall result in a formal complaint to the state medical board and applicable specialty societies.  Instructions provided at this appointment: Patient Instructions  Pain Management Discharge Instructions  General Discharge Instructions :  If you need to reach your doctor call: Monday-Friday 8:00 am - 4:00 pm at [phone] or toll free [phone].  After clinic hours [phone] to have operator reach doctor.  Bring all of your medication bottles to all  your appointments in the pain clinic.  To cancel or reschedule your appointment with Pain Management please remember to call 24 hours in advance to avoid a fee.  Refer to the educational materials which you have been given on: General Risks, I had my Procedure. Discharge Instructions, Post Sedation.  Post Procedure Instructions:  The drugs you were given will stay in your system until tomorrow, so for the next 24 hours you should not drive, make any legal decisions or drink any alcoholic beverages.  You may eat anything you prefer, but it is better to start with liquids then soups and crackers, and gradually work up to solid foods.  Please notify your doctor immediately if you have any unusual bleeding, trouble breathing or pain that is not related to your normal pain.  Depending on the type of procedure that was done, some parts of your body may feel week and/or numb.  This usually clears up by tonight or the next day.  Walk with the use of an assistive device or accompanied by an adult for the 24 hours.  You may use ice on the affected area for the first 24 hours.  Put ice in a Ziploc bag and cover with a towel and place against area 15 minutes on 15 minutes off.  You may switch to heat after 24 hours.

## 2016-03-13 NOTE — Patient Instructions (Signed)

## 2016-03-14 ENCOUNTER — Telehealth: Payer: Self-pay

## 2016-03-14 NOTE — Telephone Encounter (Signed)
Post procedure phone call.  Patient states it didn't work this time.  Instructed patient to make sure she documents pain on her pain diary, uses heat and informed her that she needed to give it some time for the steroid to start working.  Informed patient to call for any questions or concerns.

## 2016-03-18 ENCOUNTER — Ambulatory Visit: Payer: Medicare Other | Attending: Pain Medicine | Admitting: Pain Medicine

## 2016-03-18 ENCOUNTER — Encounter: Payer: Self-pay | Admitting: Pain Medicine

## 2016-03-18 VITALS — BP 130/79 | HR 103 | Temp 97.6°F | Resp 16 | Ht 64.0 in | Wt 116.0 lb

## 2016-03-18 DIAGNOSIS — M791 Myalgia: Secondary | ICD-10-CM

## 2016-03-18 DIAGNOSIS — Z9889 Other specified postprocedural states: Secondary | ICD-10-CM | POA: Insufficient documentation

## 2016-03-18 DIAGNOSIS — G8929 Other chronic pain: Secondary | ICD-10-CM | POA: Diagnosis not present

## 2016-03-18 DIAGNOSIS — Z79899 Other long term (current) drug therapy: Secondary | ICD-10-CM | POA: Diagnosis not present

## 2016-03-18 DIAGNOSIS — F1190 Opioid use, unspecified, uncomplicated: Secondary | ICD-10-CM

## 2016-03-18 DIAGNOSIS — M47816 Spondylosis without myelopathy or radiculopathy, lumbar region: Secondary | ICD-10-CM

## 2016-03-18 DIAGNOSIS — Z87891 Personal history of nicotine dependence: Secondary | ICD-10-CM | POA: Insufficient documentation

## 2016-03-18 DIAGNOSIS — G4701 Insomnia due to medical condition: Secondary | ICD-10-CM | POA: Diagnosis not present

## 2016-03-18 DIAGNOSIS — M545 Low back pain: Secondary | ICD-10-CM | POA: Diagnosis not present

## 2016-03-18 DIAGNOSIS — Z79891 Long term (current) use of opiate analgesic: Secondary | ICD-10-CM | POA: Diagnosis not present

## 2016-03-18 DIAGNOSIS — M792 Neuralgia and neuritis, unspecified: Secondary | ICD-10-CM | POA: Diagnosis not present

## 2016-03-18 DIAGNOSIS — Z760 Encounter for issue of repeat prescription: Secondary | ICD-10-CM | POA: Diagnosis not present

## 2016-03-18 DIAGNOSIS — G894 Chronic pain syndrome: Secondary | ICD-10-CM | POA: Diagnosis not present

## 2016-03-18 DIAGNOSIS — G4709 Other insomnia: Secondary | ICD-10-CM | POA: Diagnosis not present

## 2016-03-18 DIAGNOSIS — Z90710 Acquired absence of both cervix and uterus: Secondary | ICD-10-CM | POA: Diagnosis not present

## 2016-03-18 DIAGNOSIS — R252 Cramp and spasm: Secondary | ICD-10-CM

## 2016-03-18 DIAGNOSIS — M1288 Other specific arthropathies, not elsewhere classified, other specified site: Secondary | ICD-10-CM | POA: Diagnosis not present

## 2016-03-18 DIAGNOSIS — M533 Sacrococcygeal disorders, not elsewhere classified: Secondary | ICD-10-CM | POA: Diagnosis not present

## 2016-03-18 DIAGNOSIS — M7918 Myalgia, other site: Secondary | ICD-10-CM

## 2016-03-18 MED ORDER — ORPHENADRINE CITRATE ER 100 MG PO TB12: 100.0000 mg | ORAL_TABLET | Freq: Two times a day (BID) | ORAL | 0 refills | Status: DC | PRN

## 2016-03-18 MED ORDER — HYDROCODONE-ACETAMINOPHEN 10-325 MG PO TABS: 1.0000 | ORAL_TABLET | Freq: Two times a day (BID) | ORAL | 0 refills | Status: DC

## 2016-03-18 MED ORDER — TRAZODONE HCL 100 MG PO TABS: 100.0000 mg | ORAL_TABLET | Freq: Every day | ORAL | 0 refills | Status: DC

## 2016-03-18 MED ORDER — GABAPENTIN 100 MG PO CAPS: 100.0000 mg | ORAL_CAPSULE | Freq: Every day | ORAL | 2 refills | Status: DC

## 2016-03-18 NOTE — Progress Notes (Signed)
Nursing Pain Medication Assessment:  Safety precautions to be maintained throughout the outpatient stay will include: orient to surroundings, keep bed in low position, maintain call bell within reach at all times, provide assistance with transfer out of bed and ambulation.  Medication Inspection Compliance: Paula Duncan did not comply with our request to bring her pills to be counted. She was reminded that bringing the medication bottles, even when empty, is a requirement. Pill Count: No pills available to be counted today. Bottle Appearance: No container available. Did not bring bottle(s) to appointment. Medication: None brought in. Filled Date: N/A Medication last intake: 03/18/16 8:30

## 2016-03-18 NOTE — Patient Instructions (Signed)
Sacroiliac (SI) Joint Injection Patient Information  Description: The sacroiliac joint connects the scrum (very low back and tailbone) to the ilium (a pelvic bone which also forms half of the hip joint).  Normally this joint experiences very little motion.  When this joint becomes inflamed or unstable low back and or hip and pelvis pain may result.  Injection of this joint with local anesthetics (numbing medicines) and steroids can provide diagnostic information and reduce pain.  This injection is performed with the aid of x-ray guidance into the tailbone area while you are lying on your stomach.   You may experience an electrical sensation down the leg while this is being done.  You may also experience numbness.  We also may ask if we are reproducing your normal pain during the injection.  Conditions which may be treated SI injection:   Low back, buttock, hip or leg pain  Preparation for the Injection:  1. Do not eat any solid food or dairy products within 8 hours of your appointment.  2. You may drink clear liquids up to 3 hours before appointment.  Clear liquids include water, black coffee, juice or soda.  No milk or cream please. 3. You may take your regular medications, including pain medications with a sip of water before your appointment.  Diabetics should hold regular insulin (if take separately) and take 1/2 normal NPH dose the morning of the procedure.  Carry some sugar containing items with you to your appointment. 4. A driver must accompany you and be prepared to drive you home after your procedure. 5. Bring all of your current medications with you. 6. An IV may be inserted and sedation may be given at the discretion of the physician. 7. A blood pressure cuff, EKG and other monitors will often be applied during the procedure.  Some patients may need to have extra oxygen administered for a short period.  8. You will be asked to provide medical information, including your allergies,  prior to the procedure.  We must know immediately if you are taking blood thinners (like Coumadin/Warfarin) or if you are allergic to IV iodine contrast (dye).  We must know if you could possible be pregnant.  Possible side effects:   Bleeding from needle site  Infection (rare, may require surgery)  Nerve injury (rare)  Numbness & tingling (temporary)  A brief convulsion or seizure  Light-headedness (temporary)  Pain at injection site (several days)  Decreased blood pressure (temporary)  Weakness in the leg (temporary)   Call if you experience:   New onset weakness or numbness of an extremity below the injection site that last more than 8 hours.  Hives or difficulty breathing ( go to the emergency room)  Inflammation or drainage at the injection site  Any new symptoms which are concerning to you  Please note:  Although the local anesthetic injected can often make your back/ hip/ buttock/ leg feel good for several hours after the injections, the pain will likely return.  It takes 3-7 days for steroids to work in the sacroiliac area.  You may not notice any pain relief for at least that one week.  If effective, we will often do a series of three injections spaced 3-6 weeks apart to maximally decrease your pain.  After the initial series, we generally will wait some months before a repeat injection of the same type.  If you have any questions, please call [phone] [place]  COMPLICATIONS  What are the risk, side effects and possible complications? Generally speaking, most procedures are safe.  However, with any procedure there are risks, side effects, and the possibility of complications.  The risks and complications are dependent upon the sites that are lesioned, or the type of nerve block to be performed.  The closer the procedure is to the spine, the more serious the risks are.  Great care is taken when placing  the radio frequency needles, block needles or lesioning probes, but sometimes complications can occur. 1. Infection: Any time there is an injection through the skin, there is a risk of infection.  This is why sterile conditions are used for these blocks.  There are four possible types of infection. 1. Localized skin infection. 2. Central Nervous System Infection-This can be in the form of Meningitis, which can be deadly. 3. Epidural Infections-This can be in the form of an epidural abscess, which can cause pressure inside of the spine, causing compression of the spinal cord with subsequent paralysis. This would require an emergency surgery to decompress, and there are no guarantees that the patient would recover from the paralysis. 4. Discitis-This is an infection of the intervertebral discs.  It occurs in about 1% of discography procedures.  It is difficult to treat and it may lead to surgery.        2. Pain: the needles have to go through skin and soft tissues, will cause soreness.       3. Damage to internal structures:  The nerves to be lesioned may be near blood vessels or    other nerves which can be potentially damaged.       4. Bleeding: Bleeding is more common if the patient is taking blood thinners such as  aspirin, Coumadin, Ticiid, Plavix, etc., or if he/she have some genetic predisposition  such as hemophilia. Bleeding into the spinal canal can cause compression of the spinal  cord with subsequent paralysis.  This would require an emergency surgery to  decompress and there are no guarantees that the patient would recover from the  paralysis.       5. Pneumothorax:  Puncturing of a lung is a possibility, every time a needle is introduced in  the area of the chest or upper back.  Pneumothorax refers to free air around the  collapsed lung(s), inside of the thoracic cavity (chest cavity).  Another two possible  complications related to a similar event would include: Hemothorax and Chylothorax.    These are variations of the Pneumothorax, where instead of air around the collapsed  lung(s), you may have blood or chyle, respectively.       6. Spinal headaches: They may occur with any procedures in the area of the spine.       7. Persistent CSF (Cerebro-Spinal Fluid) leakage: This is a rare problem, but may occur  with prolonged intrathecal or epidural catheters either due to the formation of a fistulous  track or a dural tear.       8. Nerve damage: By working so close to the spinal cord, there is always a possibility of  nerve damage, which could be as serious as a permanent spinal cord injury with  paralysis.       9. Death:  Although rare, severe deadly allergic reactions known as "Anaphylactic  reaction" can occur to any of the medications used.      10. Worsening of the symptoms:  We can always make thing worse.  What   are the chances of something like this happening? Chances of any of this occuring are extremely low.  By statistics, you have more of a chance of getting killed in a motor vehicle accident: while driving to the hospital than any of the above occurring .  Nevertheless, you should be aware that they are possibilities.  In general, it is similar to taking a shower.  Everybody knows that you can slip, hit your head and get killed.  Does that mean that you should not shower again?  Nevertheless always keep in mind that statistics do not mean anything if you happen to be on the wrong side of them.  Even if a procedure has a 1 (one) in a 1,000,000 (million) chance of going wrong, it you happen to be that one..Also, keep in mind that by statistics, you have more of a chance of having something go wrong when taking medications.  Who should not have this procedure? If you are on a blood thinning medication (e.g. Coumadin, Plavix, see list of "Blood Thinners"), or if you have an active infection going on, you should not have the procedure.  If you are taking any blood thinners, please inform  your physician.  How should I prepare for this procedure?  Do not eat or drink anything at least six hours prior to the procedure.  Bring a driver with you .  It cannot be a taxi.  Come accompanied by an adult that can drive you back, and that is strong enough to help you if your legs get weak or numb from the local anesthetic.  Take all of your medicines the morning of the procedure with just enough water to swallow them.  If you have diabetes, make sure that you are scheduled to have your procedure done first thing in the morning, whenever possible.  If you have diabetes, take only half of your insulin dose and notify our nurse that you have done so as soon as you arrive at the clinic.  If you are diabetic, but only take blood sugar pills (oral hypoglycemic), then do not take them on the morning of your procedure.  You may take them after you have had the procedure.  Do not take aspirin or any aspirin-containing medications, at least eleven (11) days prior to the procedure.  They may prolong bleeding.  Wear loose fitting clothing that may be easy to take off and that you would not mind if it got stained with Betadine or blood.  Do not wear any jewelry or perfume  Remove any nail coloring.  It will interfere with some of our monitoring equipment.  NOTE: Remember that this is not meant to be interpreted as a complete list of all possible complications.  Unforeseen problems may occur.  BLOOD THINNERS The following drugs contain aspirin or other products, which can cause increased bleeding during surgery and should not be taken for 2 weeks prior to and 1 week after surgery.  If you should need take something for relief of minor pain, you may take acetaminophen which is found in Tylenol,m Datril, Anacin-3 and Panadol. It is not blood thinner. The products listed below are.  Do not take any of the products listed below in addition to any listed on your instruction sheet.  A.P.C or A.P.C  with Codeine Codeine Phosphate Capsules #3 Ibuprofen Ridaura  ABC compound Congesprin Imuran rimadil  Advil Cope Indocin Robaxisal  Alka-Seltzer Effervescent Pain Reliever and Antacid Coricidin or Coricidin-D  Indomethacin Rufen  Alka-Seltzer   plus Cold Medicine Cosprin Ketoprofen S-A-C Tablets  Anacin Analgesic Tablets or Capsules Coumadin Korlgesic Salflex  Anacin Extra Strength Analgesic tablets or capsules CP-2 Tablets Lanoril Salicylate  Anaprox Cuprimine Capsules Levenox Salocol  Anexsia-D Dalteparin Magan Salsalate  Anodynos Darvon compound Magnesium Salicylate Sine-off  Ansaid Dasin Capsules Magsal Sodium Salicylate  Anturane Depen Capsules Marnal Soma  APF Arthritis pain formula Dewitt's Pills Measurin Stanback  Argesic Dia-Gesic Meclofenamic Sulfinpyrazone  Arthritis Bayer Timed Release Aspirin Diclofenac Meclomen Sulindac  Arthritis pain formula Anacin Dicumarol Medipren Supac  Analgesic (Safety coated) Arthralgen Diffunasal Mefanamic Suprofen  Arthritis Strength Bufferin Dihydrocodeine Mepro Compound Suprol  Arthropan liquid Dopirydamole Methcarbomol with Aspirin Synalgos  ASA tablets/Enseals Disalcid Micrainin Tagament  Ascriptin Doan's Midol Talwin  Ascriptin A/D Dolene Mobidin Tanderil  Ascriptin Extra Strength Dolobid Moblgesic Ticlid  Ascriptin with Codeine Doloprin or Doloprin with Codeine Momentum Tolectin  Asperbuf Duoprin Mono-gesic Trendar  Aspergum Duradyne Motrin or Motrin IB Triminicin  Aspirin plain, buffered or enteric coated Durasal Myochrisine Trigesic  Aspirin Suppositories Easprin Nalfon Trillsate  Aspirin with Codeine Ecotrin Regular or Extra Strength Naprosyn Uracel  Atromid-S Efficin Naproxen Ursinus  Auranofin Capsules Elmiron Neocylate Vanquish  Axotal Emagrin Norgesic Verin  Azathioprine Empirin or Empirin with Codeine Normiflo Vitamin E  Azolid Emprazil Nuprin Voltaren  Bayer Aspirin plain, buffered or children's or timed BC Tablets or powders  Encaprin Orgaran Warfarin Sodium  Buff-a-Comp Enoxaparin Orudis Zorpin  Buff-a-Comp with Codeine Equegesic Os-Cal-Gesic   Buffaprin Excedrin plain, buffered or Extra Strength Oxalid   Bufferin Arthritis Strength Feldene Oxphenbutazone   Bufferin plain or Extra Strength Feldene Capsules Oxycodone with Aspirin   Bufferin with Codeine Fenoprofen Fenoprofen Pabalate or Pabalate-SF   Buffets II Flogesic Panagesic   Buffinol plain or Extra Strength Florinal or Florinal with Codeine Panwarfarin   Buf-Tabs Flurbiprofen Penicillamine   Butalbital Compound Four-way cold tablets Penicillin   Butazolidin Fragmin Pepto-Bismol   Carbenicillin Geminisyn Percodan   Carna Arthritis Reliever Geopen Persantine   Carprofen Gold's salt Persistin   Chloramphenicol Goody's Phenylbutazone   Chloromycetin Haltrain Piroxlcam   Clmetidine heparin Plaquenil   Cllnoril Hyco-pap Ponstel   Clofibrate Hydroxy chloroquine Propoxyphen         Before stopping any of these medications, be sure to consult the physician who ordered them.  Some, such as Coumadin (Warfarin) are ordered to prevent or treat serious conditions such as "deep thrombosis", "pumonary embolisms", and other heart problems.  The amount of time that you may need off of the medication may also vary with the medication and the reason for which you were taking it.  If you are taking any of these medications, please make sure you notify your pain physician before you undergo any procedures.          

## 2016-03-18 NOTE — Progress Notes (Signed)
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 a combination of pain intensity and pain affect, where pain affect describes the degree of emotional arousal or changes in action readiness caused by the sensory experience of pain. Factors such as social and work situation, setting, emotional state, anxiety levels, expectation, and prior pain experience may influence pain perception and show large inter-individual differences that may also be affected by time variables.  Patient instructions provided during this appointment: Patient Instructions   Sacroiliac (SI) Joint Injection Patient Information  Description: The sacroiliac joint connects the scrum (very low back and tailbone) to the ilium (a pelvic bone which also forms half of the hip joint).  Normally this joint experiences very little motion.  When this joint becomes inflamed or unstable low back and or hip and pelvis pain may result.  Injection of this joint  with local anesthetics (numbing medicines) and steroids can provide diagnostic information and reduce pain.  This  injection is performed with the aid of x-ray guidance into the tailbone area while you are lying on your stomach.   You may experience an electrical sensation down the leg while this is being done.  You may also experience numbness.  We also may ask if we are reproducing your normal pain during the injection.  Conditions which may be treated SI injection:   Low back, buttock, hip or leg pain  Preparation for the Injection:  1. Do not eat any solid food or dairy products within 8 hours of your appointment.  2. You may drink clear liquids up to 3 hours before appointment.  Clear liquids include water, black coffee, juice or soda.  No milk or cream please. 3. You may take your regular medications, including pain medications with a sip of water before your appointment.  Diabetics should hold regular insulin (if take separately) and take 1/2 normal NPH dose the morning of the procedure.  Carry some sugar containing items with you to your appointment. 4. A driver must accompany you and be prepared to drive you home after your procedure. 5. Bring all of your current medications with you. 6. An IV may be inserted and sedation may be given at the discretion of the physician. 7. A blood pressure cuff, EKG and other monitors will often be applied during the procedure.  Some patients may need to have extra oxygen administered for a short period.  8. You will be asked to provide medical information, including your allergies, prior to the procedure.  We must know immediately if you are taking blood thinners (like Coumadin/Warfarin) or if you are allergic to IV iodine contrast (dye).  We must know if you could possible be pregnant.  Possible side effects:   Bleeding from needle site  Infection (rare, may require surgery)  Nerve injury (rare)  Numbness & tingling (temporary)  A brief convulsion or seizure  Light-headedness (temporary)  Pain at injection site (several days)  Decreased blood  pressure (temporary)  Weakness in the leg (temporary)   Call if you experience:   New onset weakness or numbness of an extremity below the injection site that last more than 8 hours.  Hives or difficulty breathing ( go to the emergency room)  Inflammation or drainage at the injection site  Any new symptoms which are concerning to you  Please note:  Although the local anesthetic injected can often make your back/ hip/ buttock/ leg feel good for several hours after the injections, the pain will likely return.  It takes 3-7 days for steroids to work in the sacroiliac area.  You may not notice any pain relief for at least that one week.  If effective, we will often do a series of three injections spaced 3-6 weeks apart to maximally decrease your pain.  After the initial series, we generally will wait some months before a repeat injection of the same type.  If you have any questions, please call [phone] [place]  What are the risk, side effects and possible complications? Generally speaking, most procedures are safe.  However, with any procedure there are risks, side effects, and the possibility of complications.  The risks and complications are dependent upon the sites that are lesioned, or the type of nerve block to be performed.  The closer the procedure is to the spine,  the more serious the risks are.  Great care is taken when placing the radio frequency needles, block needles or lesioning probes, but sometimes complications can occur. 1. Infection: Any time there is an injection through the skin, there is a risk of infection.  This is why sterile conditions are used for these blocks.  There are four possible types of infection. 1. Localized skin infection. 2. Central Nervous System Infection-This can be in the form of Meningitis, which can be deadly. 3. Epidural Infections-This can be in the form of an  epidural abscess, which can cause pressure inside of the spine, causing compression of the spinal cord with subsequent paralysis. This would require an emergency surgery to decompress, and there are no guarantees that the patient would recover from the paralysis. 4. Discitis-This is an infection of the intervertebral discs.  It occurs in about 1% of discography procedures.  It is difficult to treat and it may lead to surgery.        2. Pain: the needles have to go through skin and soft tissues, will cause soreness.       3. Damage to internal structures:  The nerves to be lesioned may be near blood vessels or    other nerves which can be potentially damaged.       4. Bleeding: Bleeding is more common if the patient is taking blood thinners such as  aspirin, Coumadin, Ticiid, Plavix, etc., or if he/she have some genetic predisposition  such as hemophilia. Bleeding into the spinal canal can cause compression of the spinal  cord with subsequent paralysis.  This would require an emergency surgery to  decompress and there are no guarantees that the patient would recover from the  paralysis.       5. Pneumothorax:  Puncturing of a lung is a possibility, every time a needle is introduced in  the area of the chest or upper back.  Pneumothorax refers to free air around the  collapsed lung(s), inside of the thoracic cavity (chest cavity).  Another two possible  complications related to a similar event would include: Hemothorax and Chylothorax.   These are variations of the Pneumothorax, where instead of air around the collapsed  lung(s), you may have blood or chyle, respectively.       6. Spinal headaches: They may occur with any procedures in the area of the spine.       7. Persistent CSF (Cerebro-Spinal Fluid) leakage: This is a rare problem, but may occur  with prolonged intrathecal or epidural catheters either due to the formation of a fistulous  track or a dural tear.       8. Nerve damage: By working so close  to the spinal cord, there is always a possibility of  nerve damage, which could be as serious as a permanent spinal cord injury with  paralysis.       9. Death:  Although rare, severe deadly allergic reactions known as "Anaphylactic  reaction" can occur to any of the medications used.      10. Worsening of the symptoms:  We can always make thing worse.  What are the chances of something like this happening? Chances of any of this occuring are extremely low.  By statistics, you have more of a chance of getting killed in a motor vehicle accident: while driving to the hospital than any of the above occurring .  Nevertheless, you should be aware that they are possibilities.  In general, it is similar to  taking a shower.  Everybody knows that you can slip, hit your head and get killed.  Does that mean that you should not shower again?  Nevertheless always keep in mind that statistics do not mean anything if you happen to be on the wrong side of them.  Even if a procedure has a 1 (one) in a 1,000,000 (million) chance of going wrong, it you happen to be that one..Also, keep in mind that by statistics, you have more of a chance of having something go wrong when taking medications.  Who should not have this procedure? If you are on a blood thinning medication (e.g. Coumadin, Plavix, see list of "Blood Thinners"), or if you have an active infection going on, you should not have the procedure.  If you are taking any blood thinners, please inform your physician.  How should I prepare for this procedure?  Do not eat or drink anything at least six hours prior to the procedure.  Bring a driver with you .  It cannot be a taxi.  Come accompanied by an adult that can drive you back, and that is strong enough to help you if your legs get weak or numb from the local anesthetic.  Take all of your medicines the morning of the procedure with just enough water to swallow them.  If you have diabetes, make sure that you  are scheduled to have your procedure done first thing in the morning, whenever possible.  If you have diabetes, take only half of your insulin dose and notify our nurse that you have done so as soon as you arrive at the clinic.  If you are diabetic, but only take blood sugar pills (oral hypoglycemic), then do not take them on the morning of your procedure.  You may take them after you have had the procedure.  Do not take aspirin or any aspirin-containing medications, at least eleven (11) days prior to the procedure.  They may prolong bleeding.  Wear loose fitting clothing that may be easy to take off and that you would not mind if it got stained with Betadine or blood.  Do not wear any jewelry or perfume  Remove any nail coloring.  It will interfere with some of our monitoring equipment.  NOTE: Remember that this is not meant to be interpreted as a complete list of all possible complications.  Unforeseen problems may occur.  BLOOD THINNERS The following drugs contain aspirin or other products, which can cause increased bleeding during surgery and should not be taken for 2 weeks prior to and 1 week after surgery.  If you should need take something for relief of minor pain, you may take acetaminophen which is found in Tylenol,m Datril, Anacin-3 and Panadol. It is not blood thinner. The products listed below are.  Do not take any of the products listed below in addition to any listed on your instruction sheet.  A.P.C or A.P.C with Codeine Codeine Phosphate Capsules #3 Ibuprofen Ridaura  ABC compound Congesprin Imuran rimadil  Advil Cope Indocin Robaxisal  Alka-Seltzer Effervescent Pain Reliever and Antacid Coricidin or Coricidin-D  Indomethacin Rufen  Alka-Seltzer plus Cold Medicine Cosprin Ketoprofen S-A-C Tablets  Anacin Analgesic Tablets or Capsules Coumadin Korlgesic Salflex  Anacin Extra Strength Analgesic tablets or capsules CP-2 Tablets Lanoril Salicylate  Anaprox Cuprimine Capsules  Levenox Salocol  Anexsia-D Dalteparin Magan Salsalate  Anodynos Darvon compound Magnesium Salicylate Sine-off  Ansaid Dasin Capsules Magsal Sodium Salicylate  Anturane Depen Capsules Marnal Soma  APF Arthritis pain  formula Dewitt's Pills Measurin Stanback  Argesic Dia-Gesic Meclofenamic Sulfinpyrazone  Arthritis Bayer Timed Release Aspirin Diclofenac Meclomen Sulindac  Arthritis pain formula Anacin Dicumarol Medipren Supac  Analgesic (Safety coated) Arthralgen Diffunasal Mefanamic Suprofen  Arthritis Strength Bufferin Dihydrocodeine Mepro Compound Suprol  Arthropan liquid Dopirydamole Methcarbomol with Aspirin Synalgos  ASA tablets/Enseals Disalcid Micrainin Tagament  Ascriptin Doan's Midol Talwin  Ascriptin A/D Dolene Mobidin Tanderil  Ascriptin Extra Strength Dolobid Moblgesic Ticlid  Ascriptin with Codeine Doloprin or Doloprin with Codeine Momentum Tolectin  Asperbuf Duoprin Mono-gesic Trendar  Aspergum Duradyne Motrin or Motrin IB Triminicin  Aspirin plain, buffered or enteric coated Durasal Myochrisine Trigesic  Aspirin Suppositories Easprin Nalfon Trillsate  Aspirin with Codeine Ecotrin Regular or Extra Strength Naprosyn Uracel  Atromid-S Efficin Naproxen Ursinus  Auranofin Capsules Elmiron Neocylate Vanquish  Axotal Emagrin Norgesic Verin  Azathioprine Empirin or Empirin with Codeine Normiflo Vitamin E  Azolid Emprazil Nuprin Voltaren  Bayer Aspirin plain, buffered or children's or timed BC Tablets or powders Encaprin Orgaran Warfarin Sodium  Buff-a-Comp Enoxaparin Orudis Zorpin  Buff-a-Comp with Codeine Equegesic Os-Cal-Gesic   Buffaprin Excedrin plain, buffered or Extra Strength Oxalid   Bufferin Arthritis Strength Feldene Oxphenbutazone   Bufferin plain or Extra Strength Feldene Capsules Oxycodone with Aspirin   Bufferin with Codeine Fenoprofen Fenoprofen Pabalate or Pabalate-SF   Buffets II Flogesic Panagesic   Buffinol plain or Extra Strength Florinal or Florinal with  Codeine Panwarfarin   Buf-Tabs Flurbiprofen Penicillamine   Butalbital Compound Four-way cold tablets Penicillin   Butazolidin Fragmin Pepto-Bismol   Carbenicillin Geminisyn Percodan   Carna Arthritis Reliever Geopen Persantine   Carprofen Gold's salt Persistin   Chloramphenicol Goody's Phenylbutazone   Chloromycetin Haltrain Piroxlcam   Clmetidine heparin Plaquenil   Cllnoril Hyco-pap Ponstel   Clofibrate Hydroxy chloroquine Propoxyphen         Before stopping any of these medications, be sure to consult the physician who ordered them.  Some, such as Coumadin (Warfarin) are ordered to prevent or treat serious conditions such as "deep thrombosis", "pumonary embolisms", and other heart problems.  The amount of time that you may need off of the medication may also vary with the medication and the reason for which you were taking it.  If you are taking any of these medications, please make sure you notify your pain physician before you undergo any procedures.

## 2016-03-19 ENCOUNTER — Telehealth: Payer: Self-pay | Admitting: Pain Medicine

## 2016-03-19 NOTE — Telephone Encounter (Signed)
Has one med that was not called in on med refill date, please call patient, says she told nurse about this med.

## 2016-03-19 NOTE — Telephone Encounter (Signed)
Patient notified that per Dr. Adalberto Cole note, he does not prescribe Mirtazepam for her. Advised patient to call her PCP.

## 2016-03-21 DIAGNOSIS — R109 Unspecified abdominal pain: Secondary | ICD-10-CM | POA: Diagnosis not present

## 2016-03-21 DIAGNOSIS — Z87891 Personal history of nicotine dependence: Secondary | ICD-10-CM | POA: Diagnosis not present

## 2016-03-26 LAB — TOXASSURE SELECT 13 (MW), URINE

## 2016-04-01 ENCOUNTER — Ambulatory Visit (HOSPITAL_BASED_OUTPATIENT_CLINIC_OR_DEPARTMENT_OTHER): Payer: Medicare Other | Admitting: Pain Medicine

## 2016-04-01 ENCOUNTER — Encounter: Payer: Self-pay | Admitting: Pain Medicine

## 2016-04-01 ENCOUNTER — Ambulatory Visit
Admission: RE | Admit: 2016-04-01 | Discharge: 2016-04-01 | Disposition: A | Discharge status: Home / Self Care | Payer: Medicare Other | Source: Ambulatory Visit | Attending: Pain Medicine | Admitting: Pain Medicine

## 2016-04-01 ENCOUNTER — Telehealth: Payer: Self-pay | Admitting: Pain Medicine

## 2016-04-01 ENCOUNTER — Telehealth: Payer: Self-pay | Admitting: Pulmonary Disease

## 2016-04-01 VITALS — BP 135/70 | HR 93 | Temp 97.9°F | Resp 14 | Ht 64.0 in | Wt 116.0 lb

## 2016-04-01 DIAGNOSIS — M533 Sacrococcygeal disorders, not elsewhere classified: Secondary | ICD-10-CM | POA: Insufficient documentation

## 2016-04-01 DIAGNOSIS — G8929 Other chronic pain: Secondary | ICD-10-CM | POA: Diagnosis not present

## 2016-04-01 DIAGNOSIS — M545 Low back pain: Secondary | ICD-10-CM | POA: Diagnosis not present

## 2016-04-01 DIAGNOSIS — Z9889 Other specified postprocedural states: Secondary | ICD-10-CM | POA: Diagnosis not present

## 2016-04-01 MED ORDER — LIDOCAINE HCL (PF) 1 % IJ SOLN
10.0000 mL | Freq: Once | INTRAMUSCULAR | Status: AC
  Administered 2016-04-01: 10 mL

## 2016-04-01 MED ORDER — ROPIVACAINE HCL 2 MG/ML IJ SOLN
9.0000 mL | Freq: Once | INTRAMUSCULAR | Status: AC
  Administered 2016-04-01: 9 mL
  Filled 2016-04-01: qty 10

## 2016-04-01 MED ORDER — METHYLPREDNISOLONE ACETATE 80 MG/ML IJ SUSP
80.0000 mg | Freq: Once | INTRAMUSCULAR | Status: AC
  Administered 2016-04-01: 80 mg
  Filled 2016-04-01: qty 1

## 2016-04-01 MED ORDER — MIDAZOLAM HCL 5 MG/5ML IJ SOLN
1.0000 mg | INTRAMUSCULAR | Status: DC | PRN
  Filled 2016-04-01: qty 5

## 2016-04-01 MED ORDER — ORPHENADRINE CITRATE 30 MG/ML IJ SOLN
60.0000 mg | Freq: Once | INTRAMUSCULAR | Status: AC
  Administered 2016-04-01: 30 mg via INTRAMUSCULAR

## 2016-04-01 MED ORDER — LACTATED RINGERS IV SOLN
1000.0000 mL | Freq: Once | INTRAVENOUS | Status: AC
  Administered 2016-04-01: 1000 mL via INTRAVENOUS

## 2016-04-01 MED ORDER — FENTANYL CITRATE (PF) 100 MCG/2ML IJ SOLN
25.0000 ug | INTRAMUSCULAR | Status: DC | PRN
  Filled 2016-04-01: qty 2

## 2016-04-01 MED ORDER — ORPHENADRINE CITRATE 30 MG/ML IJ SOLN
INTRAMUSCULAR | Status: AC
  Administered 2016-04-01: 30 mg via INTRAMUSCULAR
  Filled 2016-04-01: qty 2

## 2016-04-01 NOTE — Telephone Encounter (Signed)
Will address this in the morning while doing post procedure call back.

## 2016-04-01 NOTE — Telephone Encounter (Signed)
PT requesting refill for Guafenesin with Codeine syrup.  PT states that she tried Mucinex otc and starting back on Symbicort but that hs not helped the cough.  Still coughing so much she has headaches from it.  Please advise if ok for refill.

## 2016-04-01 NOTE — Telephone Encounter (Signed)
What other symptoms is she having? Is she using her Tessalon Perles? Does she need to be seen? It looks she was started on Lorcet HD 10-325mg  today. Probably not good to use the Codeine cough syrup with this and this pill may help with her cough anyways. Please inquire regarding her symptoms and let me know. Please offer her an appointment with first available APP.

## 2016-04-01 NOTE — Progress Notes (Signed)
Safety precautions to be maintained throughout the outpatient stay will include: orient to surroundings, keep bed in low position, maintain call bell within reach at all times, provide assistance with transfer out of bed and ambulation.  

## 2016-04-01 NOTE — Progress Notes (Signed)
Patient's Name: Paula Duncan  MRN: [id number]  Referring Provider: Milinda Pointer, MD  DOB: 03-06-1961  PCP: Vista Mink, FNP  DOS: 04/01/2016  Note by: Kathlen Brunswick. Dossie Arbour, MD  Service setting: Ambulatory outpatient  Location: [facility] (AMB) Pain Management Facility  Visit type: Procedure  Specialty: Interventional Pain Management  Patient type: Established   Primary Reason for Visit: Interventional Pain Management Treatment. CC: Back Pain (lower)  Procedure:  Anesthesia, Analgesia, Anxiolysis:  Type: Diagnostic Sacroiliac Joint Steroid Injection Region: Superior Lumbosacral Region Level: PSIS (Posterior Superior Iliac Spine) Laterality: Bilateral  Type: Local Anesthesia with Moderate (Conscious) Sedation Local Anesthetic: Lidocaine 1% Route: Intravenous (IV) IV Access: Secured Sedation: Meaningful verbal contact was maintained at all times during the procedure  Indication(s): Analgesia and Anxiety  Indications: 1. 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 No significant relief with the procedure, suggesting that the SI joint may not be involved. Additional Norflex 60 mg IM at to be given in the recovery area.  Pre-Procedure Assessment:  Paula Duncan is a 55 y.o. (year old), female patient, seen today for interventional treatment. She  has a past surgical history that includes Spine surgery (1994); Abdominal hysterectomy; Exploratory laparotomy; Cesarean section; Esophagogastroduodenoscopy (07/12/09); and left heart catheterization with coronary angiogram (N/A, 04/06/2013).. 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  Pain Type: Chronic pain Pain Location: Back Pain Orientation: Lower Pain Descriptors / Indicators: Aching, Constant Pain Frequency: Constant  Date of Last Visit:  03/18/16 Service Provided on Last Visit: Med Refill  Coagulation Parameters Lab Results  Component Value Date   INR 1.0 04/02/2013   LABPROT 10.7 04/02/2013   APTT  11/05/2008    37        IF BASELINE aPTT IS ELEVATED, SUGGEST PATIENT RISK ASSESSMENT BE USED TO DETERMINE APPROPRIATE ANTICOAGULANT THERAPY.   PLT 225 07/07/2014   Verification of the correct person, correct site (including marking of site), and correct procedure were performed and confirmed by the patient.  Consent: Before the procedure and under the influence of no sedative(s), amnesic(s), or anxiolytics, the patient was informed of the treatment options, risks and possible complications. To fulfill our ethical and legal obligations, as recommended by the American Medical Association's Code of Ethics, I have informed the patient of my clinical impression; the nature and purpose of the treatment or procedure; the risks, benefits, and possible complications of the intervention; the alternatives, including doing nothing; the risk(s) and benefit(s) of the alternative treatment(s) or procedure(s); and the risk(s) and benefit(s) of doing nothing. The patient was provided information about the general risks and possible complications associated with the procedure. These may include, but are not limited to: failure to achieve desired goals, infection, bleeding, organ or nerve damage, allergic reactions, paralysis, and death. In addition, the patient was informed of those risks and complications associated to the procedure, such as failure to decrease pain; infection; bleeding; organ or nerve damage with subsequent damage to sensory, motor, and/or autonomic systems, resulting in permanent pain, numbness, and/or weakness of one or several areas of the body; allergic reactions; (i.e.: anaphylactic reaction); and/or death. Furthermore, the patient was informed of those risks and complications associated with the medications. These include,  but are not limited to: allergic reactions (i.e.: anaphylactic or anaphylactoid reaction(s)); adrenal axis suppression; blood sugar elevation that in  diabetics may result in ketoacidosis or comma; water retention that in patients with history of congestive heart failure may result in shortness of breath, pulmonary edema, and decompensation with resultant heart failure; weight gain; swelling or edema; medication-induced neural toxicity; particulate matter embolism and blood vessel occlusion with resultant organ, and/or nervous system infarction; and/or aseptic necrosis of one or more joints. Finally, the patient was informed that Medicine is not an exact science; therefore, there is also the possibility of unforeseen or unpredictable risks and/or possible complications that may result in a catastrophic outcome. The patient indicated having understood very clearly. We have given the patient no guarantees and we have made no promises. Enough time was given to the patient to ask questions, all of which were answered to the patient's satisfaction. Paula Duncan has indicated that she wanted to continue with the procedure.  Consent Attestation: I, the ordering provider, attest that I have discussed with the patient the benefits, risks, side-effects, alternatives, likelihood of achieving goals, and potential problems during recovery for the procedure that I have provided informed consent.  Pre-Procedure Preparation:  Safety Precautions: Allergies reviewed. The patient was asked about blood thinners, or active infections, both of which were denied. The patient was asked to confirm the procedure and laterality, before marking the site, and again before commencing the procedure. Appropriate site, procedure, and patient were confirmed by following the Joint Commission's Universal Protocol (UP.01.01.01), in the form of a "Time Out". The patient was asked to participate by confirming the accuracy of the "Time Out"  information. Patient was assessed for positional comfort and pressure points before starting the procedure. Allergies: She is allergic to nsaids; tramadol hcl; chantix [varenicline]; and metronidazole. Allergy Precautions: None required Infection Control Precautions: Sterile technique used. Standard Universal Precautions were taken as recommended by the Department of [facility] for Disease Control and Prevention (CDC). Standard pre-surgical skin prep was conducted. Respiratory hygiene and cough etiquette was practiced. Hand hygiene observed. Safe injection practices and needle disposal techniques followed. SDV (single dose vial) medications used. Medications properly checked for expiration dates and contaminants. Personal protective equipment (PPE) used as per protocol. Monitoring:  As per clinic protocol. Vitals:   04/01/16 1115 04/01/16 1123 04/01/16 1132 04/01/16 1136  BP: 140/61 (!) 145/78 137/64 135/70  Pulse: (!) 102 94 93 93  Resp: 15 16 15 14   Temp: 97.9 F (36.6 C)     TempSrc: Temporal     SpO2: 99% 100% 100% 100%  Weight:      Height:      Calculated BMI: Body mass index is 19.91 kg/m. Time-out: "Time-out" completed before starting procedure, as per protocol.  Description of Procedure Process:  Time-out: "Time-out" completed before starting procedure, as per protocol. Position: Prone Target Area: Superior, posterior, aspect of the sacroiliac fissure Approach: Posterior, paraspinal, ipsilateral approach. Area Prepped: Entire Lower Lumbosacral Region Prepping solution: ChloraPrep (2% chlorhexidine gluconate and 70% isopropyl alcohol) Safety Precautions: Aspiration looking for blood return was conducted prior to all injections. At no point did we inject any substances, as a needle was being advanced. No attempts were made at seeking any paresthesias. Safe injection practices and needle disposal techniques used. Medications properly checked for expiration dates. SDV  (single dose vial) medications used. Description of the Procedure: Protocol guidelines were followed. The patient was placed in position over the procedure table. The target area was identified and the area prepped in the usual manner. Skin & deeper tissues infiltrated with local anesthetic. Appropriate amount of time  allowed to pass for local anesthetics to take effect. The procedure needle was advanced under fluoroscopic guidance into the sacroiliac joint until a firm endpoint was obtained. Proper needle placement secured. Negative aspiration confirmed. Solution injected in intermittent fashion, asking for systemic symptoms every 0.5cc of injectate. The needles were then removed and the area cleansed, making sure to leave some of the prepping solution back to take advantage of its long term bactericidal properties. EBL: None Materials & Medications:  Needle(s) Type: Epidural needle Gauge: 22G Length: 3.5-in Medication(s): We administered lactated ringers, methylPREDNISolone acetate, ropivacaine (PF) 2 mg/ml (0.2%), lidocaine (PF), orphenadrine, and orphenadrine. Please see chart orders for dosing details.  Imaging Guidance (Non-Spinal):  Type of Imaging Technique: Fluoroscopy Guidance (Non-Spinal) Indication(s): Assistance in needle guidance and placement for procedures requiring needle placement in or near specific anatomical locations not easily accessible without such assistance. Exposure Time: Please see nurses notes. Contrast: None used. Fluoroscopic Guidance: I was personally present during the use of fluoroscopy. "Tunnel Vision Technique" used to obtain the best possible view of the target area. Parallax error corrected before commencing the procedure. "Direction-depth-direction" technique used to introduce the needle under continuous pulsed fluoroscopy. Once target was reached, antero-posterior, oblique, and lateral fluoroscopic projection used confirm needle placement in all planes. Images  permanently stored in EMR. Interpretation: No contrast injected. I personally interpreted the imaging intraoperatively. Adequate needle placement confirmed in multiple planes. Permanent images saved into the patient's record.  Antibiotic Prophylaxis:  Indication(s): No indications identified. Type:  Antibiotics Given (last 72 hours)    None      Post-operative Assessment:  Complications: No immediate post-treatment complications observed by team, or reported by patient. Disposition: The patient tolerated the entire procedure well. A repeat set of vitals were taken after the procedure and the patient was kept under observation following institutional policy, for this type of procedure. Post-procedural neurological assessment was performed, showing return to baseline, prior to discharge. The patient was provided with post-procedure discharge instructions, including a section on how to identify potential problems. Should any problems arise concerning this procedure, the patient was given instructions to immediately contact us, at any time, without hesitation. In any case, we plan to contact the patient by telephone for a follow-up status report regarding this interventional procedure. Comments:  No additional relevant information.  Plan of Care  Discharge to: Discharge home  Medications ordered for procedure: Meds ordered this encounter  Medications  . fentaNYL (SUBLIMAZE) injection 25-50 mcg    Make sure Narcan is available in the pyxis when using this medication. In the event of respiratory depression (RR< 8/min): Titrate NARCAN (naloxone) in increments of 0.1 to 0.2 mg IV at 2-3 minute intervals, until desired degree of reversal.  . lactated ringers infusion 1,000 mL  . midazolam (VERSED) 5 MG/5ML injection 1-2 mg    Make sure Flumazenil is available in the pyxis when using this medication. If oversedation occurs, administer 0.2 mg IV over 15 sec. If after 45 sec no response, administer 0.2  mg again over 1 min; may repeat at 1 min intervals; not to exceed 4 doses (1 mg)  . methylPREDNISolone acetate (DEPO-MEDROL) injection 80 mg  . ropivacaine (PF) 2 mg/ml (0.2%) (NAROPIN) epidural 9 mL  . lidocaine (PF) (XYLOCAINE) 1 % injection 10 mL  . orphenadrine (NORFLEX) injection 60 mg  . orphenadrine (NORFLEX) 30 MG/ML injection    Hensley, Norma: cabinet override   Medications administered: (For more details, see medical record) We administered lactated ringers, methylPREDNISolone acetate,  ropivacaine (PF) 2 mg/ml (0.2%), lidocaine (PF), orphenadrine, and orphenadrine. Lab-work, Procedure(s), & Referral(s) Ordered: Orders Placed This Encounter  Procedures  . DG C-Arm 1-60 Min-No Report   Imaging Ordered: Results for orders placed in visit on 03/13/16  DG C-Arm 1-60 Min-No Report   Narrative Fluoroscopy was utilized by the requesting physician.  No radiographic  interpretation.    New Prescriptions   No medications on file   Physician-requested Follow-up:  Return in about 2 weeks (around 04/15/2016) for Post-Procedure evaluation.  Future Appointments Date Time Provider [place]  05/13/2016 1:45 PM Milinda Pointer, MD [facility] None  06/18/2016 9:30 AM Milinda Pointer, MD [facility] None   Primary Care Physician: Vista Mink, FNP Location: [facility] Out[facility] Facility Note by: Kathlen Brunswick. Dossie Arbour, M.D, DABA, DABAPM, DABPM, DABIPP, FIPP  Disclaimer:  Medicine is not an exact science. The only guarantee in medicine is that nothing is guaranteed. It is important to note that the decision to proceed with this intervention was based on the information collected from the patient. The Data and conclusions were drawn from the patient's questionnaire, the interview, and the physical examination. Because the information was provided in large part by the patient, it cannot be guaranteed that it has not been purposely or unconsciously manipulated. Every  effort has been made to obtain as much relevant data as possible for this evaluation. It is important to note that the conclusions that lead to this procedure are derived in large part from the available data. Always take into account that the treatment will also be dependent on availability of resources and existing treatment guidelines, considered by other Pain Management Practitioners as being common knowledge and practice, at the time of the intervention. For Medico-Legal purposes, it is also important to point out that variation in procedural techniques and pharmacological choices are the acceptable norm. The indications, contraindications, technique, and results of the above procedure should only be interpreted and judged by a Board-Certified Interventional Pain Specialist with extensive familiarity and expertise in the same exact procedure and technique. Attempts at providing opinions without similar or greater experience and expertise than that of the treating physician will be considered as inappropriate and unethical, and shall result in a formal complaint to the state medical board and applicable specialty societies.  Instructions provided at this appointment: Patient Instructions  Pain Management Discharge Instructions  General Discharge Instructions :  If you need to reach your doctor call: Monday-Friday 8:00 am - 4:00 pm at [phone] or toll free [phone].  After clinic hours [phone] to have operator reach doctor.  Bring all of your medication bottles to all your appointments in the pain clinic.  To cancel or reschedule your appointment with Pain Management please remember to call 24 hours in advance to avoid a fee.  Refer to the educational materials which you have been given on: General Risks, I had my Procedure. Discharge Instructions, Post Sedation.  Post Procedure Instructions:  The drugs you were given will stay in your system until tomorrow, so for the next 24  hours you should not drive, make any legal decisions or drink any alcoholic beverages.  You may eat anything you prefer, but it is better to start with liquids then soups and crackers, and gradually work up to solid foods.  Please notify your doctor immediately if you have any unusual bleeding, trouble breathing or pain that is not related to your normal pain.  Depending on the type of procedure that was done, some parts of  your body may feel week and/or numb.  This usually clears up by tonight or the next day.  Walk with the use of an assistive device or accompanied by an adult for the 24 hours.  You may use ice on the affected area for the first 24 hours.  Put ice in a Ziploc bag and cover with a towel and place against area 15 minutes on 15 minutes off.  You may switch to heat after 24 hours.

## 2016-04-01 NOTE — Patient Instructions (Signed)

## 2016-04-01 NOTE — Telephone Encounter (Signed)
Having hand cramps and wanted to see if Dr. Dossie Arbour wants her to have some blood work or some kind of test done to see if she is low on some vitamin or something, forgot to ask when she was here

## 2016-04-02 ENCOUNTER — Telehealth: Payer: Self-pay | Admitting: Pain Medicine

## 2016-04-02 ENCOUNTER — Telehealth: Payer: Self-pay | Admitting: *Deleted

## 2016-04-02 NOTE — Telephone Encounter (Signed)
Called and spoke to pt. Pt states the cough has worsened since last OV on 10.17.17. Pt states the cough is productive with clear mucus. Pt denies increase in SOB, CP/tightness and f/c/s. Pt states the Tessalon perles did not work and pt states she is using the flutter valve and it is not helping much either. Pt states she is taking the Lorcet BID scheduled for back pain. Offered appt with SG on 11.29.17 at 0900, pt states she will need to speak with her husband before scheduling appt d/t transportation issues.   JN please advise. Thanks.

## 2016-04-02 NOTE — Telephone Encounter (Signed)
Noted. The pain medication may still have a beneficial effect on her cough just as the Codeine in her cough syrup does. If she is able to make it we need to get a sputum culture for AFB, Fungus, & Bacteria as well as a CXR PA/LAT depending on what Judson Roch finds during her exam.

## 2016-04-02 NOTE — Telephone Encounter (Signed)
Spoke with patient, states she is having a headache.  Discussed sedation from yesterday and if she has ever had a problem like this before.  States that her PCP placed her on K+ 20 meq on yesterday and she took this last night around 6pm.  Told patient that if she thinks that it is the potassium she could stop it. Patient states the last time her head hurt this much she had a sinus infection.  Asked her if this could possibly be the problem and she stated that it hurts when she taps across her brow.  Suggested Decongestant to help clear sinuses, running a humidifier in the home.  Patient states she has used her inhaler and then asked if an antihistamine would help.  Instructed that these were for different s/s.  Continued to encourage patient to drink plenty of fluids to make sure she is hydrated.  Patient verbalizes u/o information.

## 2016-04-02 NOTE — Telephone Encounter (Signed)
LVM for patient to call for any concerns post procedure.

## 2016-04-02 NOTE — Telephone Encounter (Signed)
Has worse headache she has ever had, please call ASAP

## 2016-04-03 NOTE — Telephone Encounter (Signed)
lmtcb

## 2016-04-08 NOTE — Telephone Encounter (Signed)
lmomtcb x 2  

## 2016-04-10 NOTE — Telephone Encounter (Signed)
lmomtcb x 3  

## 2016-04-23 ENCOUNTER — Other Ambulatory Visit: Payer: Self-pay | Admitting: Pain Medicine

## 2016-04-23 ENCOUNTER — Telehealth: Payer: Self-pay | Admitting: Pain Medicine

## 2016-04-23 DIAGNOSIS — G4700 Insomnia, unspecified: Secondary | ICD-10-CM

## 2016-04-23 DIAGNOSIS — M7918 Myalgia, other site: Secondary | ICD-10-CM

## 2016-04-23 DIAGNOSIS — M792 Neuralgia and neuritis, unspecified: Secondary | ICD-10-CM

## 2016-04-23 DIAGNOSIS — G4701 Insomnia due to medical condition: Secondary | ICD-10-CM

## 2016-04-23 DIAGNOSIS — G8929 Other chronic pain: Secondary | ICD-10-CM

## 2016-04-23 DIAGNOSIS — R252 Cramp and spasm: Secondary | ICD-10-CM

## 2016-04-23 MED ORDER — ORPHENADRINE CITRATE ER 100 MG PO TB12: 100.0000 mg | ORAL_TABLET | Freq: Two times a day (BID) | ORAL | 0 refills | Status: DC | PRN

## 2016-04-23 MED ORDER — TRAZODONE HCL 100 MG PO TABS: 100.0000 mg | ORAL_TABLET | Freq: Every day | ORAL | 0 refills | Status: DC

## 2016-04-23 NOTE — Telephone Encounter (Signed)
Patient called stating she needs refills on meds

## 2016-04-23 NOTE — Telephone Encounter (Signed)
Did not get refills on Trazodone or Norflex at last visit on 04-01-16. Spoke with Dr. Dossie Arbour, will make an exception this one time, but in the future, it will be her responsibility to make sure she has all meds needed with enough refills to last until next appointment. Patient informed of this.

## 2016-05-08 ENCOUNTER — Ambulatory Visit: Payer: Medicare Other | Admitting: Pain Medicine

## 2016-05-13 ENCOUNTER — Encounter: Payer: Self-pay | Admitting: Pain Medicine

## 2016-05-13 ENCOUNTER — Ambulatory Visit: Payer: Medicare Other | Attending: Pain Medicine | Admitting: Pain Medicine

## 2016-05-13 VITALS — BP 134/63 | HR 111 | Temp 98.0°F | Resp 16 | Ht 64.0 in | Wt 113.0 lb

## 2016-05-13 DIAGNOSIS — Z9889 Other specified postprocedural states: Secondary | ICD-10-CM | POA: Diagnosis not present

## 2016-05-13 DIAGNOSIS — G8929 Other chronic pain: Secondary | ICD-10-CM

## 2016-05-13 DIAGNOSIS — Z90710 Acquired absence of both cervix and uterus: Secondary | ICD-10-CM | POA: Diagnosis not present

## 2016-05-13 DIAGNOSIS — M791 Myalgia: Secondary | ICD-10-CM | POA: Diagnosis not present

## 2016-05-13 DIAGNOSIS — M47816 Spondylosis without myelopathy or radiculopathy, lumbar region: Secondary | ICD-10-CM

## 2016-05-13 DIAGNOSIS — Z79899 Other long term (current) drug therapy: Secondary | ICD-10-CM | POA: Insufficient documentation

## 2016-05-13 DIAGNOSIS — I6521 Occlusion and stenosis of right carotid artery: Secondary | ICD-10-CM | POA: Diagnosis not present

## 2016-05-13 DIAGNOSIS — R252 Cramp and spasm: Secondary | ICD-10-CM | POA: Diagnosis not present

## 2016-05-13 DIAGNOSIS — M545 Low back pain: Secondary | ICD-10-CM | POA: Diagnosis not present

## 2016-05-13 DIAGNOSIS — Z5181 Encounter for therapeutic drug level monitoring: Secondary | ICD-10-CM | POA: Diagnosis not present

## 2016-05-13 DIAGNOSIS — M7918 Myalgia, other site: Secondary | ICD-10-CM

## 2016-05-13 DIAGNOSIS — Z79891 Long term (current) use of opiate analgesic: Secondary | ICD-10-CM | POA: Insufficient documentation

## 2016-05-13 DIAGNOSIS — M792 Neuralgia and neuritis, unspecified: Secondary | ICD-10-CM | POA: Diagnosis not present

## 2016-05-13 DIAGNOSIS — I517 Cardiomegaly: Secondary | ICD-10-CM | POA: Diagnosis not present

## 2016-05-13 DIAGNOSIS — F1190 Opioid use, unspecified, uncomplicated: Secondary | ICD-10-CM

## 2016-05-13 DIAGNOSIS — K469 Unspecified abdominal hernia without obstruction or gangrene: Secondary | ICD-10-CM | POA: Diagnosis not present

## 2016-05-13 DIAGNOSIS — M1288 Other specific arthropathies, not elsewhere classified, other specified site: Secondary | ICD-10-CM | POA: Diagnosis not present

## 2016-05-13 DIAGNOSIS — G4701 Insomnia due to medical condition: Secondary | ICD-10-CM

## 2016-05-13 DIAGNOSIS — G894 Chronic pain syndrome: Secondary | ICD-10-CM

## 2016-05-13 MED ORDER — HYDROCODONE-ACETAMINOPHEN 10-325 MG PO TABS: 1.0000 | ORAL_TABLET | Freq: Two times a day (BID) | ORAL | 0 refills | Status: DC

## 2016-05-13 MED ORDER — TRAZODONE HCL 100 MG PO TABS: 100.0000 mg | ORAL_TABLET | Freq: Every day | ORAL | 0 refills | Status: DC

## 2016-05-13 MED ORDER — GABAPENTIN 100 MG PO CAPS: 100.0000 mg | ORAL_CAPSULE | Freq: Every day | ORAL | 2 refills | Status: DC

## 2016-05-13 MED ORDER — ORPHENADRINE CITRATE ER 100 MG PO TB12: 100.0000 mg | ORAL_TABLET | Freq: Two times a day (BID) | ORAL | 0 refills | Status: DC | PRN

## 2016-05-13 NOTE — Progress Notes (Signed)
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 arousal or changes in action readiness caused by the sensory experience of pain. Factors such as social and work situation, setting, emotional state, anxiety levels, expectation, and prior pain experience may influence pain perception and show large inter-individual differences that may also be affected by time variables.  Patient instructions provided during this appointment: Patient Instructions   GENERAL RISKS AND COMPLICATIONS  What are the risk, side effects and possible complications? Generally speaking, most procedures are safe.  However, with any procedure there are risks, side effects, and the possibility of complications.  The risks and complications are dependent upon the sites that are lesioned, or the type of  nerve block to be performed.  The closer the procedure is to the spine, the more serious the risks are.  Great care is taken when placing the radio frequency needles, block needles or lesioning probes, but sometimes  complications can occur. 1. Infection: Any time there is an injection through the skin, there is a risk of infection.  This is why sterile conditions are used for these blocks.  There are four possible types of infection. 1. Localized skin infection. 2. Central Nervous System Infection-This can be in the form of Meningitis, which can be deadly. 3. Epidural Infections-This can be in the form of an epidural abscess, which can cause pressure inside of the spine, causing compression of the spinal cord with subsequent paralysis. This would require an emergency surgery to decompress, and there are no guarantees that the patient would recover from the paralysis. 4. Discitis-This is an infection of the intervertebral discs.  It occurs in about 1% of discography procedures.  It is difficult to treat and it may lead to surgery.        2. Pain: the needles have to go through skin and soft tissues, will cause soreness.       3. Damage to internal structures:  The nerves to be lesioned may be near blood vessels or    other nerves which can be potentially damaged.       4. Bleeding: Bleeding is more common if the patient is taking blood thinners such as  aspirin, Coumadin, Ticiid, Plavix, etc., or if he/she have some genetic predisposition  such as hemophilia. Bleeding into the spinal canal can cause compression of the spinal  cord with subsequent paralysis.  This would require an emergency surgery to  decompress and there are no guarantees that the patient would recover from the  paralysis.       5. Pneumothorax:  Puncturing of a lung is a possibility, every time a needle is introduced in  the area of the chest or upper back.  Pneumothorax refers to free air around the  collapsed lung(s), inside of the thoracic cavity (chest cavity).  Another two possible  complications related to a similar event would include: Hemothorax and Chylothorax.   These are variations of the Pneumothorax, where instead of air around the  collapsed  lung(s), you may have blood or chyle, respectively.       6. Spinal headaches: They may occur with any procedures in the area of the spine.       7. Persistent CSF (Cerebro-Spinal Fluid) leakage: This is a rare problem, but may occur  with prolonged intrathecal or epidural catheters either due to the formation of a fistulous  track or a dural tear.       8. Nerve damage: By working so close to the spinal cord, there is always a possibility of  nerve damage, which could be as serious as a permanent spinal cord injury with  paralysis.       9. Death:  Although rare, severe deadly allergic reactions known as "Anaphylactic  reaction" can occur to any of the medications used.      10. Worsening of the symptoms:  We can always make thing worse.  What are the chances of something like this happening? Chances of any of this occuring are extremely low.  By statistics, you have more of a chance of getting killed in a motor vehicle accident: while driving to the hospital than any of the above occurring .  Nevertheless, you  should be aware that they are possibilities.  In general, it is similar to taking a shower.  Everybody knows that you can slip, hit your head and get killed.  Does that mean that you should not shower again?  Nevertheless always keep in mind that statistics do not mean anything if you happen to be on the wrong side of them.  Even if a procedure has a 1 (one) in a 1,000,000 (million) chance of going wrong, it you happen to be that one..Also, keep in mind that by statistics, you have more of a chance of having something go wrong when taking medications.  Who should not have this procedure? If you are on a blood thinning medication (e.g. Coumadin, Plavix, see list of "Blood Thinners"), or if you have an active infection going on, you should not have the procedure.  If you are taking any blood thinners, please inform your physician.  How should I prepare for this procedure?  Do not eat  or drink anything at least six hours prior to the procedure.  Bring a driver with you .  It cannot be a taxi.  Come accompanied by an adult that can drive you back, and that is strong enough to help you if your legs get weak or numb from the local anesthetic.  Take all of your medicines the morning of the procedure with just enough water to swallow them.  If you have diabetes, make sure that you are scheduled to have your procedure done first thing in the morning, whenever possible.  If you have diabetes, take only half of your insulin dose and notify our nurse that you have done so as soon as you arrive at the clinic.  If you are diabetic, but only take blood sugar pills (oral hypoglycemic), then do not take them on the morning of your procedure.  You may take them after you have had the procedure.  Do not take aspirin or any aspirin-containing medications, at least eleven (11) days prior to the procedure.  They may prolong bleeding.  Wear loose fitting clothing that may be easy to take off and that you would not mind if it got stained with Betadine or blood.  Do not wear any jewelry or perfume  Remove any nail coloring.  It will interfere with some of our monitoring equipment.  NOTE: Remember that this is not meant to be interpreted as a complete list of all possible complications.  Unforeseen problems may occur.  BLOOD THINNERS The following drugs contain aspirin or other products, which can cause increased bleeding during surgery and should not be taken for 2 weeks prior to and 1 week after surgery.  If you should need take something for relief of minor pain, you may take acetaminophen which is found in Tylenol,m Datril, Anacin-3 and Panadol. It is not blood thinner. The products listed below are.  Do not take any of the products listed below in addition to any listed on your instruction sheet.  A.P.C or A.P.C with Codeine Codeine Phosphate Capsules #3 Ibuprofen Ridaura  ABC compound  Congesprin Imuran rimadil  Advil Cope Indocin Robaxisal  Alka-Seltzer Effervescent Pain Reliever and Antacid Coricidin or Coricidin-D  Indomethacin Rufen  Alka-Seltzer plus Cold Medicine Cosprin Ketoprofen S-A-C Tablets  Anacin Analgesic Tablets or Capsules Coumadin Korlgesic Salflex  Anacin Extra Strength Analgesic tablets or capsules CP-2 Tablets Lanoril Salicylate  Anaprox Cuprimine Capsules Levenox Salocol  Anexsia-D Dalteparin Magan Salsalate  Anodynos Darvon compound Magnesium Salicylate Sine-off  Ansaid Dasin  Capsules Magsal Sodium Salicylate  Anturane Depen Capsules Marnal Soma  APF Arthritis pain formula Dewitt's Pills Measurin Stanback  Argesic Dia-Gesic Meclofenamic Sulfinpyrazone  Arthritis Bayer Timed Release Aspirin Diclofenac Meclomen Sulindac  Arthritis pain formula Anacin Dicumarol Medipren Supac  Analgesic (Safety coated) Arthralgen Diffunasal Mefanamic Suprofen  Arthritis Strength Bufferin Dihydrocodeine Mepro Compound Suprol  Arthropan liquid Dopirydamole Methcarbomol with Aspirin Synalgos  ASA tablets/Enseals Disalcid Micrainin Tagament  Ascriptin Doan's Midol Talwin  Ascriptin A/D Dolene Mobidin Tanderil  Ascriptin Extra Strength Dolobid Moblgesic Ticlid  Ascriptin with Codeine Doloprin or Doloprin with Codeine Momentum Tolectin  Asperbuf Duoprin Mono-gesic Trendar  Aspergum Duradyne Motrin or Motrin IB Triminicin  Aspirin plain, buffered or enteric coated Durasal Myochrisine Trigesic  Aspirin Suppositories Easprin Nalfon Trillsate  Aspirin with Codeine Ecotrin Regular or Extra Strength Naprosyn Uracel  Atromid-S Efficin Naproxen Ursinus  Auranofin Capsules Elmiron Neocylate Vanquish  Axotal Emagrin Norgesic Verin  Azathioprine Empirin or Empirin with Codeine Normiflo Vitamin E  Azolid Emprazil Nuprin Voltaren  Bayer Aspirin plain, buffered or children's or timed BC Tablets or powders Encaprin Orgaran Warfarin Sodium  Buff-a-Comp Enoxaparin Orudis Zorpin   Buff-a-Comp with Codeine Equegesic Os-Cal-Gesic   Buffaprin Excedrin plain, buffered or Extra Strength Oxalid   Bufferin Arthritis Strength Feldene Oxphenbutazone   Bufferin plain or Extra Strength Feldene Capsules Oxycodone with Aspirin   Bufferin with Codeine Fenoprofen Fenoprofen Pabalate or Pabalate-SF   Buffets II Flogesic Panagesic   Buffinol plain or Extra Strength Florinal or Florinal with Codeine Panwarfarin   Buf-Tabs Flurbiprofen Penicillamine   Butalbital Compound Four-way cold tablets Penicillin   Butazolidin Fragmin Pepto-Bismol   Carbenicillin Geminisyn Percodan   Carna Arthritis Reliever Geopen Persantine   Carprofen Gold's salt Persistin   Chloramphenicol Goody's Phenylbutazone   Chloromycetin Haltrain Piroxlcam   Clmetidine heparin Plaquenil   Cllnoril Hyco-pap Ponstel   Clofibrate Hydroxy chloroquine Propoxyphen         Before stopping any of these medications, be sure to consult the physician who ordered them.  Some, such as Coumadin (Warfarin) are ordered to prevent or treat serious conditions such as "deep thrombosis", "pumonary embolisms", and other heart problems.  The amount of time that you may need off of the medication may also vary with the medication and the reason for which you were taking it.  If you are taking any of these medications, please make sure you notify your pain physician before you undergo any procedures.         Facet Joint Block The facet joints connect the bones of the spine (vertebrae). They make it possible for you to bend, twist, and make other movements with your spine. They also prevent you from overbending, overtwisting, and making other excessive movements.  A facet joint block is a procedure where a numbing medicine (anesthetic) is injected into a facet joint. Often, a type of anti-inflammatory medicine called a steroid is also injected. A facet joint block may be done for two reasons:   Diagnosis. A facet joint block may be  done as a test to see whether neck or back pain is caused by a worn-down or infected facet joint. If the pain gets better after a facet joint block, it means the pain is probably coming from the facet joint. If the pain does not get better, it means the pain is probably not coming from the facet joint.   Therapy. A facet joint block may be done to relieve neck or back pain caused by a facet joint. A facet  joint block is only done as a therapy if the pain does not improve with medicine, exercise programs, physical therapy, and other forms of pain management. LET [facility] CARE PROVIDER KNOW ABOUT:   Any allergies you have.   All medicines you are taking, including vitamins, herbs, eyedrops, and over-the-counter medicines and creams.   Previous problems you or members of your family have had with the use of anesthetics.   Any blood disorders you have had.   Other health problems you have. RISKS AND COMPLICATIONS Generally, having a facet joint block is safe. However, as with any procedure, complications can occur. Possible complications associated with having a facet joint block include:   Bleeding.   Injury to a nerve near the injection site.   Pain at the injection site.   Weakness or numbness in areas controlled by nerves near the injection site.   Infection.   Temporary fluid retention.   Allergic reaction to anesthetics or medicines used during the procedure. BEFORE THE PROCEDURE   Follow your health care provider's instructions if you are taking dietary supplements or medicines. You may need to stop taking them or reduce your dosage.   Do not take any new dietary supplements or medicines without asking your health care provider first.   Follow your health care provider's instructions about eating and drinking before the procedure. You may need to stop eating and drinking several hours before the procedure.   Arrange to have an adult drive you home after the  procedure. PROCEDURE  You may need to remove your clothing and dress in an open-back gown so that your health care provider can access your spine.   The procedure will be done while you are lying on an X-ray table. Most of the time you will be asked to lie on your stomach, but you may be asked to lie in a different position if an injection will be made in your neck.   Special machines will be used to monitor your oxygen levels, heart rate, and blood pressure.   If an injection will be made in your neck, an intravenous (IV) tube will be inserted into one of your veins. Fluids and medicine will flow directly into your body through the IV tube.   The area over the facet joint where the injection will be made will be cleaned with an antiseptic soap. The surrounding skin will be covered with sterile drapes.   An anesthetic will be applied to your skin to make the injection area numb. You may feel a temporary stinging or burning sensation.   A video X-ray machine will be used to locate the joint. A contrast dye may be injected into the facet joint area to help with locating the joint.   When the joint is located, an anesthetic medicine will be injected into the joint through the needle.   Your health care provider will ask you whether you feel pain relief. If you do feel relief, a steroid may be injected to provide pain relief for a longer period of time. If you do not feel relief or feel only partial relief, additional injections of an anesthetic may be made in other facet joints.   The needle will be removed, the skin will be cleansed, and bandages will be applied.  AFTER THE PROCEDURE   You will be observed for 15-30 minutes before being allowed to go home. Do not drive. Have an adult drive you or take a taxi or public transportation instead.  If you feel pain relief, the pain will return in several hours or days when the anesthetic wears off.   You may feel pain relief 2-14  days after the procedure. The amount of time this relief lasts varies from person to person.   It is normal to feel some tenderness over the injected area(s) for 2 days following the procedure.   If you have diabetes, you may have a temporary increase in blood sugar. This information is not intended to replace advice given to you by your health care provider. Make sure you discuss any questions you have with your health care provider. Document Released: 09/11/2006 Document Revised: 05/13/2014 Document Reviewed: 01/16/2015 [employer] Interactive Patient Education  2017 [employer].

## 2016-05-13 NOTE — Patient Instructions (Signed)
GENERAL RISKS AND COMPLICATIONS  What are the risk, side effects and possible complications? Generally speaking, most procedures are safe.  However, with any procedure there are risks, side effects, and the possibility of complications.  The risks and complications are dependent upon the sites that are lesioned, or the type of nerve block to be performed.  The closer the procedure is to the spine, the more serious the risks are.  Great care is taken when placing the radio frequency needles, block needles or lesioning probes, but sometimes complications can occur. 1. Infection: Any time there is an injection through the skin, there is a risk of infection.  This is why sterile conditions are used for these blocks.  There are four possible types of infection. 1. Localized skin infection. 2. Central Nervous System Infection-This can be in the form of Meningitis, which can be deadly. 3. Epidural Infections-This can be in the form of an epidural abscess, which can cause pressure inside of the spine, causing compression of the spinal cord with subsequent paralysis. This would require an emergency surgery to decompress, and there are no guarantees that the patient would recover from the paralysis. 4. Discitis-This is an infection of the intervertebral discs.  It occurs in about 1% of discography procedures.  It is difficult to treat and it may lead to surgery.        2. Pain: the needles have to go through skin and soft tissues, will cause soreness.       3. Damage to internal structures:  The nerves to be lesioned may be near blood vessels or    other nerves which can be potentially damaged.       4. Bleeding: Bleeding is more common if the patient is taking blood thinners such as  aspirin, Coumadin, Ticiid, Plavix, etc., or if he/she have some genetic predisposition  such as hemophilia. Bleeding into the spinal canal can cause compression of the spinal  cord with subsequent paralysis.  This would require an  emergency surgery to  decompress and there are no guarantees that the patient would recover from the  paralysis.       5. Pneumothorax:  Puncturing of a lung is a possibility, every time a needle is introduced in  the area of the chest or upper back.  Pneumothorax refers to free air around the  collapsed lung(s), inside of the thoracic cavity (chest cavity).  Another two possible  complications related to a similar event would include: Hemothorax and Chylothorax.   These are variations of the Pneumothorax, where instead of air around the collapsed  lung(s), you may have blood or chyle, respectively.       6. Spinal headaches: They may occur with any procedures in the area of the spine.       7. Persistent CSF (Cerebro-Spinal Fluid) leakage: This is a rare problem, but may occur  with prolonged intrathecal or epidural catheters either due to the formation of a fistulous  track or a dural tear.       8. Nerve damage: By working so close to the spinal cord, there is always a possibility of  nerve damage, which could be as serious as a permanent spinal cord injury with  paralysis.       9. Death:  Although rare, severe deadly allergic reactions known as "Anaphylactic  reaction" can occur to any of the medications used.      10. Worsening of the symptoms:  We can always make thing worse.    What are the chances of something like this happening? Chances of any of this occuring are extremely low.  By statistics, you have more of a chance of getting killed in a motor vehicle accident: while driving to the hospital than any of the above occurring .  Nevertheless, you should be aware that they are possibilities.  In general, it is similar to taking a shower.  Everybody knows that you can slip, hit your head and get killed.  Does that mean that you should not shower again?  Nevertheless always keep in mind that statistics do not mean anything if you happen to be on the wrong side of them.  Even if a procedure has a 1  (one) in a 1,000,000 (million) chance of going wrong, it you happen to be that one..Also, keep in mind that by statistics, you have more of a chance of having something go wrong when taking medications.  Who should not have this procedure? If you are on a blood thinning medication (e.g. Coumadin, Plavix, see list of "Blood Thinners"), or if you have an active infection going on, you should not have the procedure.  If you are taking any blood thinners, please inform your physician.  How should I prepare for this procedure?  Do not eat or drink anything at least six hours prior to the procedure.  Bring a driver with you .  It cannot be a taxi.  Come accompanied by an adult that can drive you back, and that is strong enough to help you if your legs get weak or numb from the local anesthetic.  Take all of your medicines the morning of the procedure with just enough water to swallow them.  If you have diabetes, make sure that you are scheduled to have your procedure done first thing in the morning, whenever possible.  If you have diabetes, take only half of your insulin dose and notify our nurse that you have done so as soon as you arrive at the clinic.  If you are diabetic, but only take blood sugar pills (oral hypoglycemic), then do not take them on the morning of your procedure.  You may take them after you have had the procedure.  Do not take aspirin or any aspirin-containing medications, at least eleven (11) days prior to the procedure.  They may prolong bleeding.  Wear loose fitting clothing that may be easy to take off and that you would not mind if it got stained with Betadine or blood.  Do not wear any jewelry or perfume  Remove any nail coloring.  It will interfere with some of our monitoring equipment.  NOTE: Remember that this is not meant to be interpreted as a complete list of all possible complications.  Unforeseen problems may occur.  BLOOD THINNERS The following drugs  contain aspirin or other products, which can cause increased bleeding during surgery and should not be taken for 2 weeks prior to and 1 week after surgery.  If you should need take something for relief of minor pain, you may take acetaminophen which is found in Tylenol,m Datril, Anacin-3 and Panadol. It is not blood thinner. The products listed below are.  Do not take any of the products listed below in addition to any listed on your instruction sheet.  A.P.C or A.P.C with Codeine Codeine Phosphate Capsules #3 Ibuprofen Ridaura  ABC compound Congesprin Imuran rimadil  Advil Cope Indocin Robaxisal  Alka-Seltzer Effervescent Pain Reliever and Antacid Coricidin or Coricidin-D  Indomethacin Rufen    Alka-Seltzer plus Cold Medicine Cosprin Ketoprofen S-A-C Tablets  Anacin Analgesic Tablets or Capsules Coumadin Korlgesic Salflex  Anacin Extra Strength Analgesic tablets or capsules CP-2 Tablets Lanoril Salicylate  Anaprox Cuprimine Capsules Levenox Salocol  Anexsia-D Dalteparin Magan Salsalate  Anodynos Darvon compound Magnesium Salicylate Sine-off  Ansaid Dasin Capsules Magsal Sodium Salicylate  Anturane Depen Capsules Marnal Soma  APF Arthritis pain formula Dewitt's Pills Measurin Stanback  Argesic Dia-Gesic Meclofenamic Sulfinpyrazone  Arthritis Bayer Timed Release Aspirin Diclofenac Meclomen Sulindac  Arthritis pain formula Anacin Dicumarol Medipren Supac  Analgesic (Safety coated) Arthralgen Diffunasal Mefanamic Suprofen  Arthritis Strength Bufferin Dihydrocodeine Mepro Compound Suprol  Arthropan liquid Dopirydamole Methcarbomol with Aspirin Synalgos  ASA tablets/Enseals Disalcid Micrainin Tagament  Ascriptin Doan's Midol Talwin  Ascriptin A/D Dolene Mobidin Tanderil  Ascriptin Extra Strength Dolobid Moblgesic Ticlid  Ascriptin with Codeine Doloprin or Doloprin with Codeine Momentum Tolectin  Asperbuf Duoprin Mono-gesic Trendar  Aspergum Duradyne Motrin or Motrin IB Triminicin  Aspirin  plain, buffered or enteric coated Durasal Myochrisine Trigesic  Aspirin Suppositories Easprin Nalfon Trillsate  Aspirin with Codeine Ecotrin Regular or Extra Strength Naprosyn Uracel  Atromid-S Efficin Naproxen Ursinus  Auranofin Capsules Elmiron Neocylate Vanquish  Axotal Emagrin Norgesic Verin  Azathioprine Empirin or Empirin with Codeine Normiflo Vitamin E  Azolid Emprazil Nuprin Voltaren  Bayer Aspirin plain, buffered or children's or timed BC Tablets or powders Encaprin Orgaran Warfarin Sodium  Buff-a-Comp Enoxaparin Orudis Zorpin  Buff-a-Comp with Codeine Equegesic Os-Cal-Gesic   Buffaprin Excedrin plain, buffered or Extra Strength Oxalid   Bufferin Arthritis Strength Feldene Oxphenbutazone   Bufferin plain or Extra Strength Feldene Capsules Oxycodone with Aspirin   Bufferin with Codeine Fenoprofen Fenoprofen Pabalate or Pabalate-SF   Buffets II Flogesic Panagesic   Buffinol plain or Extra Strength Florinal or Florinal with Codeine Panwarfarin   Buf-Tabs Flurbiprofen Penicillamine   Butalbital Compound Four-way cold tablets Penicillin   Butazolidin Fragmin Pepto-Bismol   Carbenicillin Geminisyn Percodan   Carna Arthritis Reliever Geopen Persantine   Carprofen Gold's salt Persistin   Chloramphenicol Goody's Phenylbutazone   Chloromycetin Haltrain Piroxlcam   Clmetidine heparin Plaquenil   Cllnoril Hyco-pap Ponstel   Clofibrate Hydroxy chloroquine Propoxyphen         Before stopping any of these medications, be sure to consult the physician who ordered them.  Some, such as Coumadin (Warfarin) are ordered to prevent or treat serious conditions such as "deep thrombosis", "pumonary embolisms", and other heart problems.  The amount of time that you may need off of the medication may also vary with the medication and the reason for which you were taking it.  If you are taking any of these medications, please make sure you notify your pain physician before you undergo any  procedures.         Facet Joint Block The facet joints connect the bones of the spine (vertebrae). They make it possible for you to bend, twist, and make other movements with your spine. They also prevent you from overbending, overtwisting, and making other excessive movements.  A facet joint block is a procedure where a numbing medicine (anesthetic) is injected into a facet joint. Often, a type of anti-inflammatory medicine called a steroid is also injected. A facet joint block may be done for two reasons:   Diagnosis. A facet joint block may be done as a test to see whether neck or back pain is caused by a worn-down or infected facet joint. If the pain gets better after a facet joint block, it means the   pain is probably coming from the facet joint. If the pain does not get better, it means the pain is probably not coming from the facet joint.   Therapy. A facet joint block may be done to relieve neck or back pain caused by a facet joint. A facet joint block is only done as a therapy if the pain does not improve with medicine, exercise programs, physical therapy, and other forms of pain management. LET YOUR HEALTH CARE PROVIDER KNOW ABOUT:   Any allergies you have.   All medicines you are taking, including vitamins, herbs, eyedrops, and over-the-counter medicines and creams.   Previous problems you or members of your family have had with the use of anesthetics.   Any blood disorders you have had.   Other health problems you have. RISKS AND COMPLICATIONS Generally, having a facet joint block is safe. However, as with any procedure, complications can occur. Possible complications associated with having a facet joint block include:   Bleeding.   Injury to a nerve near the injection site.   Pain at the injection site.   Weakness or numbness in areas controlled by nerves near the injection site.   Infection.   Temporary fluid retention.   Allergic reaction to  anesthetics or medicines used during the procedure. BEFORE THE PROCEDURE   Follow your health care provider's instructions if you are taking dietary supplements or medicines. You may need to stop taking them or reduce your dosage.   Do not take any new dietary supplements or medicines without asking your health care provider first.   Follow your health care provider's instructions about eating and drinking before the procedure. You may need to stop eating and drinking several hours before the procedure.   Arrange to have an adult drive you home after the procedure. PROCEDURE  You may need to remove your clothing and dress in an open-back gown so that your health care provider can access your spine.   The procedure will be done while you are lying on an X-ray table. Most of the time you will be asked to lie on your stomach, but you may be asked to lie in a different position if an injection will be made in your neck.   Special machines will be used to monitor your oxygen levels, heart rate, and blood pressure.   If an injection will be made in your neck, an intravenous (IV) tube will be inserted into one of your veins. Fluids and medicine will flow directly into your body through the IV tube.   The area over the facet joint where the injection will be made will be cleaned with an antiseptic soap. The surrounding skin will be covered with sterile drapes.   An anesthetic will be applied to your skin to make the injection area numb. You may feel a temporary stinging or burning sensation.   A video X-ray machine will be used to locate the joint. A contrast dye may be injected into the facet joint area to help with locating the joint.   When the joint is located, an anesthetic medicine will be injected into the joint through the needle.   Your health care provider will ask you whether you feel pain relief. If you do feel relief, a steroid may be injected to provide pain relief for a  longer period of time. If you do not feel relief or feel only partial relief, additional injections of an anesthetic may be made in other facet joints.     The needle will be removed, the skin will be cleansed, and bandages will be applied.  AFTER THE PROCEDURE   You will be observed for 15-30 minutes before being allowed to go home. Do not drive. Have an adult drive you or take a taxi or public transportation instead.   If you feel pain relief, the pain will return in several hours or days when the anesthetic wears off.   You may feel pain relief 2-14 days after the procedure. The amount of time this relief lasts varies from person to person.   It is normal to feel some tenderness over the injected area(s) for 2 days following the procedure.   If you have diabetes, you may have a temporary increase in blood sugar. This information is not intended to replace advice given to you by your health care provider. Make sure you discuss any questions you have with your health care provider. Document Released: 09/11/2006 Document Revised: 05/13/2014 Document Reviewed: 01/16/2015 [employer] Interactive Patient Education  2017 [employer]  

## 2016-05-13 NOTE — Progress Notes (Signed)
Safety precautions to be maintained throughout the outpatient stay will include: orient to surroundings, keep bed in low position, maintain call bell within reach at all times, provide assistance with transfer out of bed and ambulation.  

## 2016-05-20 ENCOUNTER — Ambulatory Visit (HOSPITAL_BASED_OUTPATIENT_CLINIC_OR_DEPARTMENT_OTHER): Payer: Medicare Other | Admitting: Pain Medicine

## 2016-05-20 ENCOUNTER — Encounter: Payer: Self-pay | Admitting: Pain Medicine

## 2016-05-20 ENCOUNTER — Ambulatory Visit
Admission: RE | Admit: 2016-05-20 | Discharge: 2016-05-20 | Disposition: A | Discharge status: Home / Self Care | Payer: Medicare Other | Source: Ambulatory Visit | Attending: Pain Medicine | Admitting: Pain Medicine

## 2016-05-20 ENCOUNTER — Telehealth: Payer: Self-pay | Admitting: Pain Medicine

## 2016-05-20 VITALS — BP 137/66 | HR 109 | Temp 98.2°F | Resp 14 | Ht 64.0 in | Wt 113.0 lb

## 2016-05-20 DIAGNOSIS — M545 Low back pain, unspecified: Secondary | ICD-10-CM

## 2016-05-20 DIAGNOSIS — M47816 Spondylosis without myelopathy or radiculopathy, lumbar region: Secondary | ICD-10-CM

## 2016-05-20 DIAGNOSIS — M1288 Other specific arthropathies, not elsewhere classified, other specified site: Secondary | ICD-10-CM | POA: Diagnosis not present

## 2016-05-20 DIAGNOSIS — G8929 Other chronic pain: Secondary | ICD-10-CM

## 2016-05-20 MED ORDER — ROPIVACAINE HCL 2 MG/ML IJ SOLN
9.0000 mL | Freq: Once | INTRAMUSCULAR | Status: AC
  Administered 2016-05-20: 9 mL via PERINEURAL
  Filled 2016-05-20: qty 20

## 2016-05-20 MED ORDER — TRIAMCINOLONE ACETONIDE 40 MG/ML IJ SUSP
40.0000 mg | Freq: Once | INTRAMUSCULAR | Status: AC
Start: 2016-05-20 — End: 2016-05-20
  Administered 2016-05-20: 40 mg
  Filled 2016-05-20: qty 2

## 2016-05-20 MED ORDER — MIDAZOLAM HCL 5 MG/5ML IJ SOLN
1.0000 mg | INTRAMUSCULAR | Status: DC | PRN
  Administered 2016-05-20: 2 mg via INTRAVENOUS
  Filled 2016-05-20: qty 5

## 2016-05-20 MED ORDER — LIDOCAINE HCL (PF) 1 % IJ SOLN
10.0000 mL | Freq: Once | INTRAMUSCULAR | Status: AC
  Administered 2016-05-20: 10 mL
  Filled 2016-05-20: qty 10

## 2016-05-20 MED ORDER — FENTANYL CITRATE (PF) 100 MCG/2ML IJ SOLN
25.0000 ug | INTRAMUSCULAR | Status: DC | PRN
  Administered 2016-05-20: 50 ug via INTRAVENOUS
  Filled 2016-05-20: qty 2

## 2016-05-20 MED ORDER — ROPIVACAINE HCL 2 MG/ML IJ SOLN
9.0000 mL | Freq: Once | INTRAMUSCULAR | Status: AC
  Administered 2016-05-20: 9 mL via PERINEURAL
  Filled 2016-05-20: qty 10

## 2016-05-20 MED ORDER — LACTATED RINGERS IV SOLN
1000.0000 mL | Freq: Once | INTRAVENOUS | Status: AC
  Administered 2016-05-20: 1000 mL via INTRAVENOUS

## 2016-05-20 MED ORDER — TRIAMCINOLONE ACETONIDE 40 MG/ML IJ SUSP
40.0000 mg | Freq: Once | INTRAMUSCULAR | Status: AC
  Administered 2016-05-20: 40 mg

## 2016-05-20 NOTE — Progress Notes (Signed)
Patient's Name: Paula Duncan  MRN: [id number]  Referring Provider: Milinda Pointer, MD  DOB: 09/02/1960  PCP: Vista Mink, FNP  DOS: 05/20/2016  Note by: Kathlen Brunswick. Dossie Arbour, MD  Service setting: Ambulatory outpatient  Location: [facility] (AMB) Pain Management Facility  Visit type: Procedure  Specialty: Interventional Pain Management  Patient type: Established   Primary Reason for Visit: Interventional Pain Management Treatment. CC: Back Pain (lower)  Procedure:  Anesthesia, Analgesia, Anxiolysis:  Type: Diagnostic Medial Branch Facet Block Region: Lumbar Level: L2, L3, L4, L5, & S1 Medial Branch Level(s) Laterality: Bilateral  Type: Local Anesthesia with Moderate (Conscious) Sedation Local Anesthetic: Lidocaine 1% Route: Intravenous (IV) IV Access: Secured Sedation: Meaningful verbal contact was maintained at all times during the procedure  Indication(s): Analgesia and Anxiety  Indications: 1. Lumbar facet syndrome (Location of Primary Source of Pain) (Bilateral) (R>L)   2. Chronic low back pain (Location of Primary Source of Pain) (Bilateral) (R>L)    Pain Score: Pre-procedure: 5 /10 Post-procedure: 0-No pain/10  Pre-op Assessment:  Previous date of service: 05/13/16 Service provided: Med Refill Paula Duncan is a 56 y.o. (year old), female patient, seen today for interventional treatment. She  has a past surgical history that includes Spine surgery (1994); Abdominal hysterectomy; Exploratory laparotomy; Cesarean section; Esophagogastroduodenoscopy (07/12/09); and left heart catheterization with coronary angiogram (N/A, 04/06/2013). Her primarily concern today is the Back Pain (lower)  Blood pressure 137/66, pulse (!) 109, temperature 98.2 F (36.8 C), temperature source Temporal, resp. rate 14, height 5\' 4"  (1.626 m), weight 113 lb (51.3 kg), SpO2 97 %.Calculated BMI: Body mass index is 19.4 kg/m. Risk Assessment: Allergies: Reviewed. She is allergic to nsaids; tramadol hcl;  chantix [varenicline]; and metronidazole. Allergy Precautions: None required Coagulopathies: "Reviewed. None identified. Blood-thinner therapy: None at this time Active Infection(s): Reviewed. None identified. Paula Duncan is afebrile Site Confirmation: Paula Duncan was asked to confirm the procedure and laterality before marking the site Procedure checklist: Completed Consent: Before the procedure and under the influence of no sedative(s), amnesic(s), or anxiolytics, the patient was informed of the treatment options, risks and possible complications. To fulfill our ethical and legal obligations, as recommended by the American Medical Association's Code of Ethics, I have informed the patient of my clinical impression; the nature and purpose of the treatment or procedure; the risks, benefits, and possible complications of the intervention; the alternatives, including doing nothing; the risk(s) and benefit(s) of the alternative treatment(s) or procedure(s); and the risk(s) and benefit(s) of doing nothing. The patient was provided information about the general risks and possible complications associated with the procedure. These may include, but are not limited to: failure to achieve desired goals, infection, bleeding, organ or nerve damage, allergic reactions, paralysis, and death. In addition, the patient was informed of those risks and complications associated to Spine-related procedures, such as failure to decrease pain; infection (i.e.: Meningitis, epidural or intraspinal abscess); bleeding (i.e.: epidural hematoma, subarachnoid hemorrhage, or any other type of intraspinal or peri-dural bleeding); organ or nerve damage (i.e.: Any type of peripheral nerve, nerve root, or spinal cord injury) with subsequent damage to sensory, motor, and/or autonomic systems, resulting in permanent pain, numbness, and/or weakness of one or several areas of the body; allergic reactions; (i.e.: anaphylactic reaction); and/or  death. Furthermore, the patient was informed of those risks and complications associated with the medications. These include, but are not limited to: allergic reactions (i.e.: anaphylactic or anaphylactoid reaction(s)); adrenal axis suppression; blood sugar elevation that in diabetics may result in ketoacidosis  or comma; water retention that in patients with history of congestive heart failure may result in shortness of breath, pulmonary edema, and decompensation with resultant heart failure; weight gain; swelling or edema; medication-induced neural toxicity; particulate matter embolism and blood vessel occlusion with resultant organ, and/or nervous system infarction; and/or aseptic necrosis of one or more joints. Finally, the patient was informed that Medicine is not an exact science; therefore, there is also the possibility of unforeseen or unpredictable risks and/or possible complications that may result in a catastrophic outcome. The patient indicated having understood very clearly. We have given the patient no guarantees and we have made no promises. Enough time was given to the patient to ask questions, all of which were answered to the patient's satisfaction. Paula Duncan has indicated that she wanted to continue with the procedure. Attestation: I, the ordering provider, attest that I have discussed with the patient the benefits, risks, side-effects, alternatives, likelihood of achieving goals, and potential problems during recovery for the procedure that I have provided informed consent. Date: 05/20/2016; Time: 10:13 AM  Pre-Procedure Preparation:  Monitoring: As per clinic protocol. Respiration, ETCO2, SpO2, BP, heart rate and rhythm monitor placed and checked for adequate function Safety Precautions: Patient was assessed for positional comfort and pressure points before starting the procedure. Time-out: I initiated and conducted the "Time-out" before starting the procedure, as per protocol. The  patient was asked to participate by confirming the accuracy of the "Time Out" information. Verification of the correct person, site, and procedure were performed and confirmed by me, the nursing staff, and the patient. "Time-out" conducted as per Joint Commission's Universal Protocol (UP.01.01.01). Date: 05/20/2016; Time: 1141 hrs.  Description of Procedure Process:   Position: Prone Target Area: For Lumbar Facet blocks, the target is the groove formed by the junction of the transverse process and superior articular process. For the L5 dorsal ramus, the target is the notch between superior articular process and sacral ala. For the S1 dorsal ramus, the target is the superior and lateral edge of the posterior S1 Sacral foramen. Approach: Paramedial approach. Area Prepped: Entire Posterior Lumbosacral Region Prepping solution: ChloraPrep (2% chlorhexidine gluconate and 70% isopropyl alcohol) Safety Precautions: Aspiration looking for blood return was conducted prior to all injections. At no point did we inject any substances, as a needle was being advanced. No attempts were made at seeking any paresthesias. Safe injection practices and needle disposal techniques used. Medications properly checked for expiration dates. SDV (single dose vial) medications used. Description of the Procedure: Protocol guidelines were followed. The patient was placed in position over the fluoroscopy table. The target area was identified and the area prepped in the usual manner. Skin desensitized using vapocoolant spray. Skin & deeper tissues infiltrated with local anesthetic. Appropriate amount of time allowed to pass for local anesthetics to take effect. The procedure needle was introduced through the skin, ipsilateral to the reported pain, and advanced to the target area. Employing the "Medial Branch Technique", the needles were advanced to the angle made by the superior and medial portion of the transverse process, and the  lateral and inferior portion of the superior articulating process of the targeted vertebral bodies. This area is known as "Burton's Eye" or the "Eye of the [place] Dog". A procedure needle was introduced through the skin, and this time advanced to the angle made by the superior and medial border of the sacral ala, and the lateral border of the S1 vertebral body. This last needle was later repositioned at the  superior and lateral border of the posterior S1 foramen. Negative aspiration confirmed. Solution injected in intermittent fashion, asking for systemic symptoms every 0.5cc of injectate. The needles were then removed and the area cleansed, making sure to leave some of the prepping solution back to take advantage of its long term bactericidal properties.   Illustration of the posterior view of the lumbar spine and the posterior neural structures. Laminae of L2 through S1 are labeled. DPRL5, dorsal primary ramus of L5; DPRS1, dorsal primary ramus of S1; DPR3, dorsal primary ramus of L3; FJ, facet (zygapophyseal) joint L3-L4; I, inferior articular process of L4; LB1, lateral branch of dorsal primary ramus of L1; IAB, inferior articular branches from L3 medial branch (supplies L4-L5 facet joint); IBP, intermediate branch plexus; MB3, medial branch of dorsal primary ramus of L3; NR3, third lumbar nerve root; S, superior articular process of L5; SAB, superior articular branches from L4 (supplies L4-5 facet joint also); TP3, transverse process of L3.  Materials:  Needle(s) Type: Epidural needle Gauge: 22G Length: 3.5-in Medication(s): We administered fentaNYL, lactated ringers, midazolam, triamcinolone acetonide, lidocaine (PF), ropivacaine (PF) 2 mg/mL (0.2%), triamcinolone acetonide, and ropivacaine (PF) 2 mg/mL (0.2%). Please see chart orders for dosing details.  Imaging Guidance (Spinal):  Type of Imaging Technique: Fluoroscopy Guidance (Spinal) Indication(s): Assistance in needle guidance and  placement for procedures requiring needle placement in or near specific anatomical locations not easily accessible without such assistance. Exposure Time: Please see nurses notes. Contrast: None used. Fluoroscopic Guidance: I was personally present during the use of fluoroscopy. "Tunnel Vision Technique" used to obtain the best possible view of the target area. Parallax error corrected before commencing the procedure. "Direction-depth-direction" technique used to introduce the needle under continuous pulsed fluoroscopy. Once target was reached, antero-posterior, oblique, and lateral fluoroscopic projection used confirm needle placement in all planes. Images permanently stored in EMR. Interpretation: No contrast injected. I personally interpreted the imaging intraoperatively. Adequate needle placement confirmed in multiple planes. Permanent images saved into the patient's record.  Antibiotic Prophylaxis:  Indication(s): None identified Antibiotic given: None Post-operative Assessment:  EBL: None Complications: No immediate post-treatment complications observed by team, or reported by patient. Note: The patient tolerated the entire procedure well. A repeat set of vitals were taken after the procedure and the patient was kept under observation following institutional policy, for this type of procedure. Post-procedural neurological assessment was performed, showing return to baseline, prior to discharge. The patient was provided with post-procedure discharge instructions, including a section on how to identify potential problems. Should any problems arise concerning this procedure, the patient was given instructions to immediately contact us, at any time, without hesitation. In any case, we plan to contact the patient by telephone for a follow-up status report regarding this interventional procedure. Comments:  No additional relevant information.  Plan of Care  Disposition: Discharge home Discharge Date:  05/20/2016; Discharge Time: 1227 hrs Physician-requested Follow-up:  Return in about 2 weeks (around 06/03/2016) for Post-Procedure evaluation.  Future Appointments Date Time Provider [place]  06/18/2016 9:30 AM Milinda Pointer, MD [facility] None   Medications ordered for procedure: Meds ordered this encounter  Medications  . fentaNYL (SUBLIMAZE) injection 25-50 mcg    Make sure Narcan is available in the pyxis when using this medication. In the event of respiratory depression (RR< 8/min): Titrate NARCAN (naloxone) in increments of 0.1 to 0.2 mg IV at 2-3 minute intervals, until desired degree of reversal.  . lactated ringers infusion 1,000 mL  . midazolam (VERSED) 5 MG/5ML  injection 1-2 mg    Make sure Flumazenil is available in the pyxis when using this medication. If oversedation occurs, administer 0.2 mg IV over 15 sec. If after 45 sec no response, administer 0.2 mg again over 1 min; may repeat at 1 min intervals; not to exceed 4 doses (1 mg)  . triamcinolone acetonide (KENALOG-40) injection 40 mg  . lidocaine (PF) (XYLOCAINE) 1 % injection 10 mL  . ropivacaine (PF) 2 mg/mL (0.2%) (NAROPIN) injection 9 mL  . triamcinolone acetonide (KENALOG-40) injection 40 mg  . ropivacaine (PF) 2 mg/mL (0.2%) (NAROPIN) injection 9 mL   Medications administered: We administered fentaNYL, lactated ringers, midazolam, triamcinolone acetonide, lidocaine (PF), ropivacaine (PF) 2 mg/mL (0.2%), triamcinolone acetonide, and ropivacaine (PF) 2 mg/mL (0.2%).  See the medical record for exact dosing, route, and time of administration.  Lab-work, Procedure(s), & Referral(s) Ordered: Orders Placed This Encounter  Procedures  . DG C-Arm 1-60 Min-No Report  . Discharge instructions  . Follow-up  . Informed Consent Details: Transcribe to consent form and obtain patient signature  . Provider attestation of informed consent for procedure/surgical case  . Verify informed consent   Imaging  Ordered: Results for orders placed in visit on 04/01/16  DG C-Arm 1-60 Min-No Report   Narrative Fluoroscopy was utilized by the requesting physician.  No radiographic  interpretation.    New Prescriptions   No medications on file    Primary Care Physician: Vista Mink, FNP Location: [facility] Note by: Kathlen Brunswick. Dossie Arbour, M.D, DABA, DABAPM, DABPM, DABIPP, FIPP Date: 05/20/2016; Time: 1:50 PM  Disclaimer:  Medicine is not an exact science. The only guarantee in medicine is that nothing is guaranteed. It is important to note that the decision to proceed with this intervention was based on the information collected from the patient. The Data and conclusions were drawn from the patient's questionnaire, the interview, and the physical examination. Because the information was provided in large part by the patient, it cannot be guaranteed that it has not been purposely or unconsciously manipulated. Every effort has been made to obtain as much relevant data as possible for this evaluation. It is important to note that the conclusions that lead to this procedure are derived in large part from the available data. Always take into account that the treatment will also be dependent on availability of resources and existing treatment guidelines, considered by other Pain Management Practitioners as being common knowledge and practice, at the time of the intervention. For Medico-Legal purposes, it is also important to point out that variation in procedural techniques and pharmacological choices are the acceptable norm. The indications, contraindications, technique, and results of the above procedure should only be interpreted and judged by a Board-Certified Interventional Pain Specialist with extensive familiarity and expertise in the same exact procedure and technique. Attempts at providing opinions without similar or greater experience and expertise than that of the  treating physician will be considered as inappropriate and unethical, and shall result in a formal complaint to the state medical board and applicable specialty societies.  Instructions provided at this appointment: Patient Instructions  Facet Blocks Patient Information  Description: The facets are joints in the spine between the vertebrae.  Like any joints in the body, facets can become irritated and painful.  Arthritis can also effect the facets.  By injecting steroids and local anesthetic in and around these joints, we can temporarily block the nerve supply to them.  Steroids act directly on irritated nerves and  tissues to reduce selling and inflammation which often leads to decreased pain.  Facet blocks may be done anywhere along the spine from the neck to the low back depending upon the location of your pain.   After numbing the skin with local anesthetic (like Novocaine), a small needle is passed onto the facet joints under x-ray guidance.  You may experience a sensation of pressure while this is being done.  The entire block usually lasts about 15-25 minutes.   Conditions which may be treated by facet blocks:   Low back/buttock pain  Neck/shoulder pain  Certain types of headaches  Preparation for the injection:  1. Do not eat any solid food or dairy products within 8 hours of your appointment. 2. You may drink clear liquid up to 3 hours before appointment.  Clear liquids include water, black coffee, juice or soda.  No milk or cream please. 3. You may take your regular medication, including pain medications, with a sip of water before your appointment.  Diabetics should hold regular insulin (if taken separately) and take 1/2 normal NPH dose the morning of the procedure.  Carry some sugar containing items with you to your appointment. 4. A driver must accompany you and be prepared to drive you home after your procedure. 5. Bring all your current medications with you. 6. An IV may be  inserted and sedation may be given at the discretion of the physician. 7. A blood pressure cuff, EKG and other monitors will often be applied during the procedure.  Some patients may need to have extra oxygen administered for a short period. 8. You will be asked to provide medical information, including your allergies and medications, prior to the procedure.  We must know immediately if you are taking blood thinners (like Coumadin/Warfarin) or if you are allergic to IV iodine contrast (dye).  We must know if you could possible be pregnant.  Possible side-effects:   Bleeding from needle site  Infection (rare, may require surgery)  Nerve injury (rare)  Numbness & tingling (temporary)  Difficulty urinating (rare, temporary)  Spinal headache (a headache worse with upright posture)  Light-headedness (temporary)  Pain at injection site (serveral days)  Decreased blood pressure (rare, temporary)  Weakness in arm/leg (temporary)  Pressure sensation in back/neck (temporary)   Call if you experience:   Fever/chills associated with headache or increased back/neck pain  Headache worsened by an upright position  New onset, weakness or numbness of an extremity below the injection site  Hives or difficulty breathing (go to the emergency room)  Inflammation or drainage at the injection site(s)  Severe back/neck pain greater than usual  New symptoms which are concerning to you  Please note:  Although the local anesthetic injected can often make your back or neck feel good for several hours after the injection, the pain will likely return. It takes 3-7 days for steroids to work.  You may not notice any pain relief for at least one week.  If effective, we will often do a series of 2-3 injections spaced 3-6 weeks apart to maximally decrease your pain.  After the initial series, you may be a candidate for a more permanent nerve block of the facets.  If you have any questions, please  call #[phone]) [facility]  Pain Management Discharge Instructions  General Discharge Instructions :  If you need to reach your doctor call: Monday-Friday 8:00 am - 4:00 pm at [phone] or toll free [phone].  After  clinic hours [phone] to have operator reach doctor.  Bring all of your medication bottles to all your appointments in the pain clinic.  To cancel or reschedule your appointment with Pain Management please remember to call 24 hours in advance to avoid a fee.  Refer to the educational materials which you have been given on: General Risks, I had my Procedure. Discharge Instructions, Post Sedation.  Post Procedure Instructions:  The drugs you were given will stay in your system until tomorrow, so for the next 24 hours you should not drive, make any legal decisions or drink any alcoholic beverages.  You may eat anything you prefer, but it is better to start with liquids then soups and crackers, and gradually work up to solid foods.  Please notify your doctor immediately if you have any unusual bleeding, trouble breathing or pain that is not related to your normal pain.  Depending on the type of procedure that was done, some parts of your body may feel week and/or numb.  This usually clears up by tonight or the next day.  Walk with the use of an assistive device or accompanied by an adult for the 24 hours.  You may use ice on the affected area for the first 24 hours.  Put ice in a Ziploc bag and cover with a towel and place against area 15 minutes on 15 minutes off.  You may switch to heat after 24 hours.  IMPORTANT: Please fill out the post procedure pain diary and bring it with you at your next appointment.

## 2016-05-20 NOTE — Patient Instructions (Signed)
Facet Blocks Patient Information  Description: The facets are joints in the spine between the vertebrae.  Like any joints in the body, facets can become irritated and painful.  Arthritis can also effect the facets.  By injecting steroids and local anesthetic in and around these joints, we can temporarily block the nerve supply to them.  Steroids act directly on irritated nerves and tissues to reduce selling and inflammation which often leads to decreased pain.  Facet blocks may be done anywhere along the spine from the neck to the low back depending upon the location of your pain.   After numbing the skin with local anesthetic (like Novocaine), a small needle is passed onto the facet joints under x-ray guidance.  You may experience a sensation of pressure while this is being done.  The entire block usually lasts about 15-25 minutes.   Conditions which may be treated by facet blocks:   Low back/buttock pain  Neck/shoulder pain  Certain types of headaches  Preparation for the injection:  1. Do not eat any solid food or dairy products within 8 hours of your appointment. 2. You may drink clear liquid up to 3 hours before appointment.  Clear liquids include water, black coffee, juice or soda.  No milk or cream please. 3. You may take your regular medication, including pain medications, with a sip of water before your appointment.  Diabetics should hold regular insulin (if taken separately) and take 1/2 normal NPH dose the morning of the procedure.  Carry some sugar containing items with you to your appointment. 4. A driver must accompany you and be prepared to drive you home after your procedure. 5. Bring all your current medications with you. 6. An IV may be inserted and sedation may be given at the discretion of the physician. 7. A blood pressure cuff, EKG and other monitors will often be applied during the procedure.  Some patients may need to have extra oxygen administered for a short  period. 8. You will be asked to provide medical information, including your allergies and medications, prior to the procedure.  We must know immediately if you are taking blood thinners (like Coumadin/Warfarin) or if you are allergic to IV iodine contrast (dye).  We must know if you could possible be pregnant.  Possible side-effects:   Bleeding from needle site  Infection (rare, may require surgery)  Nerve injury (rare)  Numbness & tingling (temporary)  Difficulty urinating (rare, temporary)  Spinal headache (a headache worse with upright posture)  Light-headedness (temporary)  Pain at injection site (serveral days)  Decreased blood pressure (rare, temporary)  Weakness in arm/leg (temporary)  Pressure sensation in back/neck (temporary)   Call if you experience:   Fever/chills associated with headache or increased back/neck pain  Headache worsened by an upright position  New onset, weakness or numbness of an extremity below the injection site  Hives or difficulty breathing (go to the emergency room)  Inflammation or drainage at the injection site(s)  Severe back/neck pain greater than usual  New symptoms which are concerning to you  Please note:  Although the local anesthetic injected can often make your back or neck feel good for several hours after the injection, the pain will likely return. It takes 3-7 days for steroids to work.  You may not notice any pain relief for at least one week.  If effective, we will often do a series of 2-3 injections spaced 3-6 weeks apart to maximally decrease your pain.  After the initial   series, you may be a candidate for a more permanent nerve block of the facets.  If you have any questions, please call [phone] [facility]  Pain Management Discharge Instructions  General Discharge Instructions :  If you need to reach your doctor call: Monday-Friday 8:00 am - 4:00 pm at [phone] or  toll free [phone].  After clinic hours [phone] to have operator reach doctor.  Bring all of your medication bottles to all your appointments in the pain clinic.  To cancel or reschedule your appointment with Pain Management please remember to call 24 hours in advance to avoid a fee.  Refer to the educational materials which you have been given on: General Risks, I had my Procedure. Discharge Instructions, Post Sedation.  Post Procedure Instructions:  The drugs you were given will stay in your system until tomorrow, so for the next 24 hours you should not drive, make any legal decisions or drink any alcoholic beverages.  You may eat anything you prefer, but it is better to start with liquids then soups and crackers, and gradually work up to solid foods.  Please notify your doctor immediately if you have any unusual bleeding, trouble breathing or pain that is not related to your normal pain.  Depending on the type of procedure that was done, some parts of your body may feel week and/or numb.  This usually clears up by tonight or the next day.  Walk with the use of an assistive device or accompanied by an adult for the 24 hours.  You may use ice on the affected area for the first 24 hours.  Put ice in a Ziploc bag and cover with a towel and place against area 15 minutes on 15 minutes off.  You may switch to heat after 24 hours.  IMPORTANT: Please fill out the post procedure pain diary and bring it with you at your next appointment. 

## 2016-05-20 NOTE — Progress Notes (Signed)
Safety precautions to be maintained throughout the outpatient stay will include: orient to surroundings, keep bed in low position, maintain call bell within reach at all times, provide assistance with transfer out of bed and ambulation.  

## 2016-05-20 NOTE — Telephone Encounter (Signed)
Patient contacted, she just wanted to inform us of her Vitamin D3 dose.

## 2016-05-20 NOTE — Telephone Encounter (Signed)
Patient called stating she is taking vitamin D3 5000 once per day, is this correct for her ? Please let her know.

## 2016-05-21 NOTE — Telephone Encounter (Signed)
Left message-No answer, instructed to call if needed

## 2016-06-18 ENCOUNTER — Other Ambulatory Visit: Payer: Self-pay | Admitting: Pain Medicine

## 2016-06-18 ENCOUNTER — Encounter: Payer: Self-pay | Admitting: Pain Medicine

## 2016-06-18 ENCOUNTER — Telehealth: Payer: Self-pay

## 2016-06-18 ENCOUNTER — Ambulatory Visit (HOSPITAL_BASED_OUTPATIENT_CLINIC_OR_DEPARTMENT_OTHER): Payer: Medicare Other | Admitting: Pain Medicine

## 2016-06-18 VITALS — BP 127/72 | HR 118 | Temp 97.8°F | Resp 16 | Ht 64.0 in | Wt 107.0 lb

## 2016-06-18 DIAGNOSIS — M5382 Other specified dorsopathies, cervical region: Secondary | ICD-10-CM | POA: Insufficient documentation

## 2016-06-18 DIAGNOSIS — G4701 Insomnia due to medical condition: Secondary | ICD-10-CM

## 2016-06-18 DIAGNOSIS — G8929 Other chronic pain: Secondary | ICD-10-CM

## 2016-06-18 DIAGNOSIS — M1288 Other specific arthropathies, not elsewhere classified, other specified site: Secondary | ICD-10-CM | POA: Diagnosis not present

## 2016-06-18 DIAGNOSIS — E785 Hyperlipidemia, unspecified: Secondary | ICD-10-CM | POA: Insufficient documentation

## 2016-06-18 DIAGNOSIS — I10 Essential (primary) hypertension: Secondary | ICD-10-CM | POA: Insufficient documentation

## 2016-06-18 DIAGNOSIS — R252 Cramp and spasm: Secondary | ICD-10-CM

## 2016-06-18 DIAGNOSIS — G894 Chronic pain syndrome: Secondary | ICD-10-CM | POA: Insufficient documentation

## 2016-06-18 DIAGNOSIS — M545 Low back pain: Secondary | ICD-10-CM | POA: Diagnosis not present

## 2016-06-18 DIAGNOSIS — R05 Cough: Secondary | ICD-10-CM | POA: Insufficient documentation

## 2016-06-18 DIAGNOSIS — M792 Neuralgia and neuritis, unspecified: Secondary | ICD-10-CM

## 2016-06-18 DIAGNOSIS — G4709 Other insomnia: Secondary | ICD-10-CM | POA: Insufficient documentation

## 2016-06-18 DIAGNOSIS — Z4889 Encounter for other specified surgical aftercare: Secondary | ICD-10-CM | POA: Insufficient documentation

## 2016-06-18 DIAGNOSIS — K432 Incisional hernia without obstruction or gangrene: Secondary | ICD-10-CM | POA: Insufficient documentation

## 2016-06-18 DIAGNOSIS — M7918 Myalgia, other site: Secondary | ICD-10-CM

## 2016-06-18 DIAGNOSIS — Z90710 Acquired absence of both cervix and uterus: Secondary | ICD-10-CM | POA: Insufficient documentation

## 2016-06-18 DIAGNOSIS — Z79891 Long term (current) use of opiate analgesic: Secondary | ICD-10-CM | POA: Insufficient documentation

## 2016-06-18 DIAGNOSIS — Z79899 Other long term (current) drug therapy: Secondary | ICD-10-CM | POA: Insufficient documentation

## 2016-06-18 MED ORDER — ORPHENADRINE CITRATE ER 100 MG PO TB12: 100.0000 mg | ORAL_TABLET | Freq: Two times a day (BID) | ORAL | 0 refills | Status: DC | PRN

## 2016-06-18 MED ORDER — GABAPENTIN 100 MG PO CAPS: 100.0000 mg | ORAL_CAPSULE | Freq: Every day | ORAL | 2 refills | Status: DC

## 2016-06-18 MED ORDER — TRAZODONE HCL 100 MG PO TABS: 100.0000 mg | ORAL_TABLET | Freq: Every day | ORAL | 0 refills | Status: DC
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
## 2016-06-18 NOTE — Progress Notes (Signed)
Nursing Pain Medication Assessment:  Safety precautions to be maintained throughout the outpatient stay will include: orient to surroundings, keep bed in low position, maintain call bell within reach at all times, provide assistance with transfer out of bed and ambulation.  Medication Inspection Compliance: Ms. Abbasi did not comply with our request to bring her pills to be counted. She was reminded that bringing the medication bottles, even when empty, is a requirement. Pill/Patch Count: None available to be counted. Bottle Appearance: No container available. Did not bring bottle(s) to appointment. Medication: None brought in. Filled Date: N/A Last Medication intake:  Yesterday   Patient states she had to be at her sisters funeral and needed to leave.  Patient asked what could she do about her medications.

## 2016-06-24 ENCOUNTER — Ambulatory Visit: Payer: Medicare Other | Admitting: Pain Medicine

## 2016-07-03 DIAGNOSIS — J449 Chronic obstructive pulmonary disease, unspecified: Secondary | ICD-10-CM | POA: Diagnosis not present

## 2016-07-03 DIAGNOSIS — Z716 Tobacco abuse counseling: Secondary | ICD-10-CM | POA: Diagnosis not present

## 2016-07-03 DIAGNOSIS — J329 Chronic sinusitis, unspecified: Secondary | ICD-10-CM | POA: Diagnosis not present

## 2016-07-03 DIAGNOSIS — J019 Acute sinusitis, unspecified: Secondary | ICD-10-CM | POA: Diagnosis not present

## 2016-07-03 DIAGNOSIS — F17210 Nicotine dependence, cigarettes, uncomplicated: Secondary | ICD-10-CM | POA: Diagnosis not present

## 2016-07-09 ENCOUNTER — Ambulatory Visit: Payer: Medicare Other | Attending: Pain Medicine | Admitting: Pain Medicine

## 2016-07-09 ENCOUNTER — Encounter: Payer: Self-pay | Admitting: Pain Medicine

## 2016-07-09 VITALS — BP 113/40 | HR 97 | Temp 98.3°F | Resp 18 | Ht 64.0 in | Wt 116.0 lb

## 2016-07-09 DIAGNOSIS — J449 Chronic obstructive pulmonary disease, unspecified: Secondary | ICD-10-CM | POA: Diagnosis not present

## 2016-07-09 DIAGNOSIS — Z5189 Encounter for other specified aftercare: Secondary | ICD-10-CM | POA: Diagnosis not present

## 2016-07-09 DIAGNOSIS — Z90710 Acquired absence of both cervix and uterus: Secondary | ICD-10-CM | POA: Diagnosis not present

## 2016-07-09 DIAGNOSIS — M545 Low back pain: Secondary | ICD-10-CM

## 2016-07-09 DIAGNOSIS — M47816 Spondylosis without myelopathy or radiculopathy, lumbar region: Secondary | ICD-10-CM | POA: Insufficient documentation

## 2016-07-09 DIAGNOSIS — R079 Chest pain, unspecified: Secondary | ICD-10-CM | POA: Insufficient documentation

## 2016-07-09 DIAGNOSIS — Z809 Family history of malignant neoplasm, unspecified: Secondary | ICD-10-CM | POA: Diagnosis not present

## 2016-07-09 DIAGNOSIS — F17210 Nicotine dependence, cigarettes, uncomplicated: Secondary | ICD-10-CM | POA: Diagnosis not present

## 2016-07-09 DIAGNOSIS — Z8371 Family history of colonic polyps: Secondary | ICD-10-CM | POA: Insufficient documentation

## 2016-07-09 DIAGNOSIS — Z79891 Long term (current) use of opiate analgesic: Secondary | ICD-10-CM | POA: Diagnosis not present

## 2016-07-09 DIAGNOSIS — M1288 Other specific arthropathies, not elsewhere classified, other specified site: Secondary | ICD-10-CM | POA: Diagnosis not present

## 2016-07-09 DIAGNOSIS — Z79899 Other long term (current) drug therapy: Secondary | ICD-10-CM | POA: Diagnosis not present

## 2016-07-09 DIAGNOSIS — Z9889 Other specified postprocedural states: Secondary | ICD-10-CM | POA: Diagnosis not present

## 2016-07-09 DIAGNOSIS — G8929 Other chronic pain: Secondary | ICD-10-CM

## 2016-07-09 DIAGNOSIS — I10 Essential (primary) hypertension: Secondary | ICD-10-CM | POA: Insufficient documentation

## 2016-07-09 NOTE — Progress Notes (Signed)
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 habitus Posture: WNL   Lower Extremity Exam    Side: Right lower extremity  Side: Left lower extremity  Inspection: No masses, redness, swelling, or asymmetry. No contractures  Inspection: No masses, redness, swelling, or asymmetry. No contractures  Functional ROM: Unrestricted ROM          Functional ROM: Unrestricted ROM          Muscle strength & Tone: Functionally intact  Muscle strength & Tone: Functionally intact  Sensory: Unimpaired  Sensory: Unimpaired  Palpation: No palpable anomalies  Palpation: No palpable anomalies   Assessment  Primary Diagnosis & Pertinent Problem List: The primary encounter diagnosis was Lumbar facet syndrome (Location of Primary Source of Pain) (Bilateral) (R>L). Diagnoses of Chronic low back pain (Location of Primary Source of Pain) (Bilateral) (R>L) and Lumbar spondylosis were also pertinent to this visit.  Status Diagnosis  Controlled Controlled Controlled 1. Lumbar facet syndrome (Location of Primary Source of Pain)  (Bilateral) (R>L)   2. Chronic low back pain (Location of Primary Source of Pain) (Bilateral) (R>L)   3. Lumbar spondylosis      Plan of Care  Pharmacotherapy (Medications Ordered): No orders of the defined types were placed in this encounter.  New Prescriptions   No medications on file   Medications administered today: Paula Duncan had no medications administered during this visit. Lab-work, procedure(s), and/or referral(s): Orders Placed This Encounter  Procedures  . Radiofrequency,Lumbar  . LUMBAR FACET(MEDIAL BRANCH NERVE BLOCK) MBNB   Imaging  and/or referral(s): None  Interventional therapies: Planned, scheduled, and/or pending:   Bilateral lumbar facet radiofrequency ablation starting with the right side. We'll do the left side 2 weeks after the right side.    Considering:   Possible bilateral lumbar facet radiofrequency ablation On 04/01/2016 the patient had elevated bilateral diagnostic sacroiliac joint block with 0% relief. On 03/13/2016 the patient had a bilateral diagnostic lumbar facet block #2with 0% relief.(Possible issue with reporting on the part of the patient.) On 02/13/2016 the patient had a bilateral diagnostic lumbar facet block #1 with 100% relief. On 05/20/2016 the patient had her third diagnostic bilateral lumbar facet block (#3) with 100% relief.   Palliative PRN treatment(s):   Palliative bilateral lumbar facet block    Provider-requested follow-up: Return for procedure (ASAA), in addition keep the medication management appointment before 09/28/2016.  Future Appointments Date Time Provider [place]  09/19/2016 9:30 AM Milinda Pointer, MD [facility] None  10/16/2016 10:45 AM Milinda Pointer, MD [facility] None   Primary Care Physician: Vista Mink, FNP Location: [facility] Note by: Kathlen Brunswick. Dossie Arbour, M.D, DABA, DABAPM, DABPM, DABIPP, FIPP Date: 07/09/2016; Time: 1:06 PM  Pain Score  Disclaimer: We use the NRS-11 scale. This is a self-reported, subjective measurement of pain severity with only modest accuracy. It is used primarily to identify changes within a particular patient. It must be understood that outpatient pain scales are significantly less accurate that those used for research, where they can be applied under ideal controlled circumstances with minimal exposure to variables. In reality, the score is likely to be a combination of pain intensity and pain affect, where pain affect describes the degree of emotional arousal or changes in action readiness caused by the sensory experience of pain. Factors such as social and work situation, setting, emotional state, anxiety levels, expectation, and prior pain experience may influence pain perception and show large inter-individual differences that may also be affected by time variables.  Patient instructions provided during this appointment: Patient Instructions   Steps to Quit Smoking Smoking tobacco can be bad for your health. It can also affect almost every organ in your body. Smoking puts you and people around you at risk for many serious long-lasting (chronic) diseases. Quitting smoking is hard, but it is one of the best things that you can do for your health. It is never too late to quit. What are the benefits of quitting smoking? When you quit smoking, you lower your risk for getting serious diseases and conditions. They can include:  Lung cancer or lung disease.  Heart disease.  Stroke.  Heart attack.  Not being able to have children (infertility).  Weak bones (osteoporosis) and broken bones (fractures). If you have coughing, wheezing, and shortness of breath, those symptoms may get better when you quit. You may also get sick less often. If you are pregnant, quitting smoking can help to lower your chances of having a baby of low birth weight. What can I do to help me quit smoking? Talk with your doctor about  what can help you quit smoking. Some things you can do (strategies) include:  Quitting smoking totally, instead of slowly cutting back how much you smoke over a period of time.  Going to in-person counseling. You are more likely to quit if you go to many counseling sessions.  Using resources and support systems, such as:  Online chats with a Social worker.  Phone quitlines.  Printed Furniture conservator/restorer.  Support groups or group counseling.  Text messaging programs.  Mobile phone apps or applications.  Taking medicines. Some of these medicines may have nicotine in them. If you are pregnant or breastfeeding, do not take any medicines to quit smoking unless your doctor says it is okay. Talk with your doctor about counseling or other things that can help you. Talk with your doctor about using more than one strategy at the same time, such as taking medicines while you are also going to in-person counseling. This can help make quitting easier. What things can I do to make it easier to quit? Quitting smoking might feel very hard at first, but there is a lot that you can do to make it easier. Take these steps:  Talk to your family and friends. Ask them to support and encourage you.  Call phone quitlines, reach out to support groups, or work with a Social worker.  Ask people who smoke to not smoke around you.  Avoid places that make you want (trigger) to smoke, such as:  Bars.  Parties.  Smoke-break areas at work.  Spend time with people who do not smoke.  Lower the stress in your life. Stress can make you want to smoke. Try these things to help your stress:  Getting regular exercise.  Deep-breathing exercises.  Yoga.  Meditating.  Doing a body scan. To do this, close your eyes, focus on one area of your body at a time from head to toe, and notice which parts of your body are tense. Try to relax the muscles in those areas.  Download or buy apps on your mobile phone or tablet that  can help you stick to your quit plan. There are many free apps, such as QuitGuide from the [employer] Office manager for Disease Control and Prevention). You can find more support from smokefree.gov and other websites. This information is not intended to replace advice given to you by your health care provider. Make sure you discuss any questions you have with your health care provider. Document Released: 02/16/2009 Document Revised: 12/19/2015 Document Reviewed: 09/06/2014 Elsevier Interactive Patient Education  2017 [employer]. Preparing for Procedure with Sedation Instructions: . Oral Intake: Do not eat or drink anything for at least 8 hours prior to your procedure. . Transportation: Public transportation is not allowed. Bring an adult driver. The driver must be physically present in our waiting room before any procedure can be started. Marland Kitchen Physical Assistance: Bring an adult capable of physically assisting you, in the event you need help. . Blood Pressure Medicine: Take your blood pressure medicine with a sip of water the morning of the procedure. . Insulin: Take only  of your normal insulin dose. . Preventing infections: Shower with an antibacterial soap the morning of your procedure. . Build-up your immune system: Take 1000 mg of Vitamin C with every meal (3 times a day) the day prior to your procedure. . Pregnancy: If you are pregnant, call and cancel the procedure. . Sickness: If you have a cold, fever, or any active infections, call and cancel the procedure. . Arrival: You must be in the facility at least 30 minutes prior to your scheduled procedure. . Children: Do not bring children with you. . Dress appropriately: Bring dark clothing that you would not mind if they get stained. . Valuables: Do not bring any jewelry or valuables. Procedure appointments are reserved for interventional treatments only. Marland Kitchen No Prescription Refills. . No medication changes will be discussed during procedure  appointments. No disability issues will be discussed.Radiofrequency Lesioning Radiofrequency  lesioning is a procedure that is performed to relieve pain. The procedure is often used for back, neck, or arm pain. Radiofrequency lesioning involves the use of a machine that creates radio waves to make heat. During the procedure, the heat is applied to the nerve that carries the pain signal. The heat damages the nerve and interferes with the pain signal. Pain relief usually starts about 2 weeks after the procedure and lasts for 6 months to 1 year. Tell a health care provider about:  Any allergies you have.  All medicines you are taking, including vitamins, herbs, eye drops, creams, and over-the-counter medicines.  Any problems you or family members have had with anesthetic medicines.  Any blood disorders you have.  Any surgeries you have had.  Any medical conditions you have.  Whether you are pregnant or may be pregnant. What are the risks? Generally, this is a safe procedure. However, problems may occur, including:  Pain or soreness at the injection site.  Infection at the injection site.  Damage to nerves or blood vessels. What happens before the procedure?  Ask your health care provider about:  Changing or stopping your regular medicines. This is especially important if you are taking diabetes medicines or blood thinners.  Taking medicines such as aspirin and ibuprofen. These medicines can thin your blood. Do not take these medicines before your procedure if your health care provider instructs you not to.  Follow instructions from your health care provider about eating or drinking restrictions.  Plan to have someone take you home after the procedure.  If you go home right after the procedure, plan to have someone with you for 24 hours. What happens during the procedure?  You will be given one or more of the following:  A medicine to help you relax (sedative).  A medicine to  numb the area (local anesthetic).  You will be awake during the procedure. You will need to be able to talk with the health care provider during the procedure.  With the help of a type of X-ray (fluoroscopy), the health care provider will insert a radiofrequency needle into the area to be treated.  Next, a wire that carries the radio waves (electrode) will be put through the radiofrequency needle. An electrical pulse will be sent through the electrode to verify the correct nerve. You will feel a tingling sensation, and you may have muscle twitching.  Then, the tissue that is around the needle tip will be heated by an electric current that is passed using the radiofrequency machine. This will numb the nerves.  A bandage (dressing) will be put on the insertion area after the procedure is done. The procedure may vary among health care providers and hospitals. What happens after the procedure?  Your blood pressure, heart rate, breathing rate, and blood oxygen level will be monitored often until the medicines you were given have worn off.  Return to your normal activities as directed by your health care provider. This information is not intended to replace advice given to you by your health care provider. Make sure you discuss any questions you have with your health care provider. Document Released: 12/19/2010 Document Revised: 09/28/2015 Document Reviewed: 05/30/2014 Elsevier Interactive Patient Education  2017 [employer] Inc[employer]  Pain Score  Introduction: The pain score used by this practice is the Verbal Numerical Rating Scale (VNRS-11). This is an 11-point scale. It is for adults and children 10 years or older. There are significant differences in how the pain score is  reported, used, and applied. Forget everything you learned in the past and learn this scoring system.  General Information: The scale should reflect your current level of pain. Unless you are specifically asked for the level of  your worst pain, or your average pain. If you are asked for one of these two, then it should be understood that it is over the past 24 hours.  Basic Activities of Daily Living (ADL): Personal hygiene, dressing, eating, transferring, and using restroom.  Instructions: Most patients tend to report their level of pain as a combination of two factors, their physical pain and their psychosocial pain. This last one is also known as "suffering" and it is reflection of how physical pain affects you socially and psychologically. From now on, report them separately. From this point on, when asked to report your pain level, report only your physical pain. Use the following table for reference.  Pain Clinic Pain Levels (0-5/10)  Pain Level Score Description  No Pain 0   Mild pain 1 Nagging, annoying, but does not interfere with basic activities of daily living (ADL). Patients are able to eat, bathe, get dressed, toileting (being able to get on and off the toilet and perform personal hygiene functions), transfer (move in and out of bed or a chair without assistance), and maintain continence (able to control bladder and bowel functions). Blood pressure and heart rate are unaffected. A normal heart rate for a healthy adult ranges from 60 to 100 bpm (beats per minute).   Mild to moderate pain 2 Noticeable and distracting. Impossible to hide from other people. More frequent flare-ups. Still possible to adapt and function close to normal. It can be very annoying and may have occasional stronger flare-ups. With discipline, patients may get used to it and adapt.   Moderate pain 3 Interferes significantly with activities of daily living (ADL). It becomes difficult to feed, bathe, get dressed, get on and off the toilet or to perform personal hygiene functions. Difficult to get in and out of bed or a chair without assistance. Very distracting. With effort, it can be ignored when deeply involved in activities.   Moderately  severe pain 4 Impossible to ignore for more than a few minutes. With effort, patients may still be able to manage work or participate in some social activities. Very difficult to concentrate. Signs of autonomic nervous system discharge are evident: dilated pupils (mydriasis); mild sweating (diaphoresis); sleep interference. Heart rate becomes elevated (>115 bpm). Diastolic blood pressure (lower number) rises above 100 mmHg. Patients find relief in laying down and not moving.   Severe pain 5 Intense and extremely unpleasant. Associated with frowning face and frequent crying. Pain overwhelms the senses.  Ability to do any activity or maintain social relationships becomes significantly limited. Conversation becomes difficult. Pacing back and forth is common, as getting into a comfortable position is nearly impossible. Pain wakes you up from deep sleep. Physical signs will be obvious: pupillary dilation; increased sweating; goosebumps; brisk reflexes; cold, clammy hands and feet; nausea, vomiting or dry heaves; loss of appetite; significant sleep disturbance with inability to fall asleep or to remain asleep. When persistent, significant weight loss is observed due to the complete loss of appetite and sleep deprivation.  Blood pressure and heart rate becomes significantly elevated. Caution: If elevated blood pressure triggers a pounding headache associated with blurred vision, then the patient should immediately seek attention at an urgent or emergency care unit, as these may be signs of an impending stroke.  Emergency Department Pain Levels (6-10/10)  Emergency Room Pain 6 Severely limiting. Requires emergency care and should not be seen or managed at an outpatient pain management facility. Communication becomes difficult and requires great effort. Assistance to reach the emergency department may be required. Facial flushing and profuse sweating along with potentially dangerous increases in heart rate and  blood pressure will be evident.   Distressing pain 7 Self-care is very difficult. Assistance is required to transport, or use restroom. Assistance to reach the emergency department will be required. Tasks requiring coordination, such as bathing and getting dressed become very difficult.   Disabling pain 8 Self-care is no longer possible. At this level, pain is disabling. The individual is unable to do even the most "basic" activities such as walking, eating, bathing, dressing, transferring to a bed, or toileting. Fine motor skills are lost. It is difficult to think clearly.   Incapacitating pain 9 Pain becomes incapacitating. Thought processing is no longer possible. Difficult to remember your own name. Control of movement and coordination are lost.   The worst pain imaginable 10 At this level, most patients pass out from pain. When this level is reached, collapse of the autonomic nervous system occurs, leading to a sudden drop in blood pressure and heart rate. This in turn results in a temporary and dramatic drop in blood flow to the brain, leading to a loss of consciousness. Fainting is one of the body's self defense mechanisms. Passing out puts the brain in a calmed state and causes it to shut down for a while, in order to begin the healing process.    Summary: 1. Refer to this scale when providing [place] with your pain level. 2. Be accurate and careful when reporting your pain level. This will help with your care. 3. Over-reporting your pain level will lead to loss of credibility. 4. Even a level of 1/10 means that there is pain and will be treated at our facility. 5. High, inaccurate reporting will be documented as "Symptom Exaggeration", leading to loss of credibility and suspicions of possible secondary gains such as obtaining more narcotics, or wanting to appear disabled, for fraudulent reasons. 6. Only pain levels of 5 or below will be seen at our facility. 7. Pain levels of 6 and above will  be sent to the Emergency Department and the appointment cancelled. _____________________________________________________________________________________________

## 2016-07-09 NOTE — Patient Instructions (Addendum)
Steps to Quit Smoking Smoking tobacco can be bad for your health. It can also affect almost every organ in your body. Smoking puts you and people around you at risk for many serious long-lasting (chronic) diseases. Quitting smoking is hard, but it is one of the best things that you can do for your health. It is never too late to quit. What are the benefits of quitting smoking? When you quit smoking, you lower your risk for getting serious diseases and conditions. They can include:  Lung cancer or lung disease.  Heart disease.  Stroke.  Heart attack.  Not being able to have children (infertility).  Weak bones (osteoporosis) and broken bones (fractures). If you have coughing, wheezing, and shortness of breath, those symptoms may get better when you quit. You may also get sick less often. If you are pregnant, quitting smoking can help to lower your chances of having a baby of low birth weight. What can I do to help me quit smoking? Talk with your doctor about what can help you quit smoking. Some things you can do (strategies) include:  Quitting smoking totally, instead of slowly cutting back how much you smoke over a period of time.  Going to in-person counseling. You are more likely to quit if you go to many counseling sessions.  Using resources and support systems, such as:  Online chats with a counselor.  Phone quitlines.  Printed self-help materials.  Support groups or group counseling.  Text messaging programs.  Mobile phone apps or applications.  Taking medicines. Some of these medicines may have nicotine in them. If you are pregnant or breastfeeding, do not take any medicines to quit smoking unless your doctor says it is okay. Talk with your doctor about counseling or other things that can help you. Talk with your doctor about using more than one strategy at the same time, such as taking medicines while you are also going to in-person counseling. This can help make quitting  easier. What things can I do to make it easier to quit? Quitting smoking might feel very hard at first, but there is a lot that you can do to make it easier. Take these steps:  Talk to your family and friends. Ask them to support and encourage you.  Call phone quitlines, reach out to support groups, or work with a counselor.  Ask people who smoke to not smoke around you.  Avoid places that make you want (trigger) to smoke, such as:  Bars.  Parties.  Smoke-break areas at work.  Spend time with people who do not smoke.  Lower the stress in your life. Stress can make you want to smoke. Try these things to help your stress:  Getting regular exercise.  Deep-breathing exercises.  Yoga.  Meditating.  Doing a body scan. To do this, close your eyes, focus on one area of your body at a time from head to toe, and notice which parts of your body are tense. Try to relax the muscles in those areas.  Download or buy apps on your mobile phone or tablet that can help you stick to your quit plan. There are many free apps, such as QuitGuide from the CDC (Centers for Disease Control and Prevention). You can find more support from smokefree.gov and other websites. This information is not intended to replace advice given to you by your health care provider. Make sure you discuss any questions you have with your health care provider. Document Released: 02/16/2009 Document Revised: 12/19/2015 Document   Reviewed: 09/06/2014 Elsevier Interactive Patient Education  2017 [employer]. Preparing for Procedure with Sedation Instructions: . Oral Intake: Do not eat or drink anything for at least 8 hours prior to your procedure. . Transportation: Public transportation is not allowed. Bring an adult driver. The driver must be physically present in our waiting room before any procedure can be started. Marland Kitchen Physical Assistance: Bring an adult capable of physically assisting you, in the event you need help. . Blood  Pressure Medicine: Take your blood pressure medicine with a sip of water the morning of the procedure. . Insulin: Take only  of your normal insulin dose. . Preventing infections: Shower with an antibacterial soap the morning of your procedure. . Build-up your immune system: Take 1000 mg of Vitamin C with every meal (3 times a day) the day prior to your procedure. . Pregnancy: If you are pregnant, call and cancel the procedure. . Sickness: If you have a cold, fever, or any active infections, call and cancel the procedure. . Arrival: You must be in the facility at least 30 minutes prior to your scheduled procedure. . Children: Do not bring children with you. . Dress appropriately: Bring dark clothing that you would not mind if they get stained. . Valuables: Do not bring any jewelry or valuables. Procedure appointments are reserved for interventional treatments only. Marland Kitchen No Prescription Refills. . No medication changes will be discussed during procedure appointments. No disability issues will be discussed.Radiofrequency Lesioning Radiofrequency lesioning is a procedure that is performed to relieve pain. The procedure is often used for back, neck, or arm pain. Radiofrequency lesioning involves the use of a machine that creates radio waves to make heat. During the procedure, the heat is applied to the nerve that carries the pain signal. The heat damages the nerve and interferes with the pain signal. Pain relief usually starts about 2 weeks after the procedure and lasts for 6 months to 1 year. Tell a health care provider about:  Any allergies you have.  All medicines you are taking, including vitamins, herbs, eye drops, creams, and over-the-counter medicines.  Any problems you or family members have had with anesthetic medicines.  Any blood disorders you have.  Any surgeries you have had.  Any medical conditions you have.  Whether you are pregnant or may be pregnant. What are the  risks? Generally, this is a safe procedure. However, problems may occur, including:  Pain or soreness at the injection site.  Infection at the injection site.  Damage to nerves or blood vessels. What happens before the procedure?  Ask your health care provider about:  Changing or stopping your regular medicines. This is especially important if you are taking diabetes medicines or blood thinners.  Taking medicines such as aspirin and ibuprofen. These medicines can thin your blood. Do not take these medicines before your procedure if your health care provider instructs you not to.  Follow instructions from your health care provider about eating or drinking restrictions.  Plan to have someone take you home after the procedure.  If you go home right after the procedure, plan to have someone with you for 24 hours. What happens during the procedure?  You will be given one or more of the following:  A medicine to help you relax (sedative).  A medicine to numb the area (local anesthetic).  You will be awake during the procedure. You will need to be able to talk with the health care provider during the procedure.  With the help  of a type of X-ray (fluoroscopy), the health care provider will insert a radiofrequency needle into the area to be treated.  Next, a wire that carries the radio waves (electrode) will be put through the radiofrequency needle. An electrical pulse will be sent through the electrode to verify the correct nerve. You will feel a tingling sensation, and you may have muscle twitching.  Then, the tissue that is around the needle tip will be heated by an electric current that is passed using the radiofrequency machine. This will numb the nerves.  A bandage (dressing) will be put on the insertion area after the procedure is done. The procedure may vary among health care providers and hospitals. What happens after the procedure?  Your blood pressure, heart rate, breathing  rate, and blood oxygen level will be monitored often until the medicines you were given have worn off.  Return to your normal activities as directed by your health care provider. This information is not intended to replace advice given to you by your health care provider. Make sure you discuss any questions you have with your health care provider. Document Released: 12/19/2010 Document Revised: 09/28/2015 Document Reviewed: 05/30/2014 Elsevier Interactive Patient Education  2017 [employer]  Pain Score  Introduction: The pain score used by this practice is the Verbal Numerical Rating Scale (VNRS-11). This is an 11-point scale. It is for adults and children 10 years or older. There are significant differences in how the pain score is reported, used, and applied. Forget everything you learned in the past and learn this scoring system.  General Information: The scale should reflect your current level of pain. Unless you are specifically asked for the level of your worst pain, or your average pain. If you are asked for one of these two, then it should be understood that it is over the past 24 hours.  Basic Activities of Daily Living (ADL): Personal hygiene, dressing, eating, transferring, and using restroom.  Instructions: Most patients tend to report their level of pain as a combination of two factors, their physical pain and their psychosocial pain. This last one is also known as "suffering" and it is reflection of how physical pain affects you socially and psychologically. From now on, report them separately. From this point on, when asked to report your pain level, report only your physical pain. Use the following table for reference.  Pain Clinic Pain Levels (0-5/10)  Pain Level Score Description  No Pain 0   Mild pain 1 Nagging, annoying, but does not interfere with basic activities of daily living (ADL). Patients are able to eat, bathe, get dressed, toileting (being able to get on and off  the toilet and perform personal hygiene functions), transfer (move in and out of bed or a chair without assistance), and maintain continence (able to control bladder and bowel functions). Blood pressure and heart rate are unaffected. A normal heart rate for a healthy adult ranges from 60 to 100 bpm (beats per minute).   Mild to moderate pain 2 Noticeable and distracting. Impossible to hide from other people. More frequent flare-ups. Still possible to adapt and function close to normal. It can be very annoying and may have occasional stronger flare-ups. With discipline, patients may get used to it and adapt.   Moderate pain 3 Interferes significantly with activities of daily living (ADL). It becomes difficult to feed, bathe, get dressed, get on and off the toilet or to perform personal hygiene functions. Difficult to get in and out of bed  or a chair without assistance. Very distracting. With effort, it can be ignored when deeply involved in activities.   Moderately severe pain 4 Impossible to ignore for more than a few minutes. With effort, patients may still be able to manage work or participate in some social activities. Very difficult to concentrate. Signs of autonomic nervous system discharge are evident: dilated pupils (mydriasis); mild sweating (diaphoresis); sleep interference. Heart rate becomes elevated (>115 bpm). Diastolic blood pressure (lower number) rises above 100 mmHg. Patients find relief in laying down and not moving.   Severe pain 5 Intense and extremely unpleasant. Associated with frowning face and frequent crying. Pain overwhelms the senses.  Ability to do any activity or maintain social relationships becomes significantly limited. Conversation becomes difficult. Pacing back and forth is common, as getting into a comfortable position is nearly impossible. Pain wakes you up from deep sleep. Physical signs will be obvious: pupillary dilation; increased sweating; goosebumps; brisk reflexes;  cold, clammy hands and feet; nausea, vomiting or dry heaves; loss of appetite; significant sleep disturbance with inability to fall asleep or to remain asleep. When persistent, significant weight loss is observed due to the complete loss of appetite and sleep deprivation.  Blood pressure and heart rate becomes significantly elevated. Caution: If elevated blood pressure triggers a pounding headache associated with blurred vision, then the patient should immediately seek attention at an urgent or emergency care unit, as these may be signs of an impending stroke.    Emergency Department Pain Levels (6-10/10)  Emergency Room Pain 6 Severely limiting. Requires emergency care and should not be seen or managed at an outpatient pain management facility. Communication becomes difficult and requires great effort. Assistance to reach the emergency department may be required. Facial flushing and profuse sweating along with potentially dangerous increases in heart rate and blood pressure will be evident.   Distressing pain 7 Self-care is very difficult. Assistance is required to transport, or use restroom. Assistance to reach the emergency department will be required. Tasks requiring coordination, such as bathing and getting dressed become very difficult.   Disabling pain 8 Self-care is no longer possible. At this level, pain is disabling. The individual is unable to do even the most "basic" activities such as walking, eating, bathing, dressing, transferring to a bed, or toileting. Fine motor skills are lost. It is difficult to think clearly.   Incapacitating pain 9 Pain becomes incapacitating. Thought processing is no longer possible. Difficult to remember your own name. Control of movement and coordination are lost.   The worst pain imaginable 10 At this level, most patients pass out from pain. When this level is reached, collapse of the autonomic nervous system occurs, leading to a sudden drop in blood pressure  and heart rate. This in turn results in a temporary and dramatic drop in blood flow to the brain, leading to a loss of consciousness. Fainting is one of the body's self defense mechanisms. Passing out puts the brain in a calmed state and causes it to shut down for a while, in order to begin the healing process.    Summary: 1. Refer to this scale when providing [place] with your pain level. 2. Be accurate and careful when reporting your pain level. This will help with your care. 3. Over-reporting your pain level will lead to loss of credibility. 4. Even a level of 1/10 means that there is pain and will be treated at our facility. 5. High, inaccurate reporting will be documented as "Symptom Exaggeration", leading  to loss of credibility and suspicions of possible secondary gains such as obtaining more narcotics, or wanting to appear disabled, for fraudulent reasons. 6. Only pain levels of 5 or below will be seen at our facility. 7. Pain levels of 6 and above will be sent to the Emergency Department and the appointment cancelled. _____________________________________________________________________________________________

## 2016-08-19 DIAGNOSIS — E7800 Pure hypercholesterolemia, unspecified: Secondary | ICD-10-CM | POA: Diagnosis not present

## 2016-08-19 DIAGNOSIS — J449 Chronic obstructive pulmonary disease, unspecified: Secondary | ICD-10-CM | POA: Diagnosis not present

## 2016-08-19 DIAGNOSIS — I2510 Atherosclerotic heart disease of native coronary artery without angina pectoris: Secondary | ICD-10-CM | POA: Diagnosis not present

## 2016-08-19 DIAGNOSIS — I951 Orthostatic hypotension: Secondary | ICD-10-CM | POA: Diagnosis not present

## 2016-08-19 DIAGNOSIS — R5383 Other fatigue: Secondary | ICD-10-CM | POA: Diagnosis not present

## 2016-08-19 DIAGNOSIS — F419 Anxiety disorder, unspecified: Secondary | ICD-10-CM | POA: Diagnosis not present

## 2016-08-19 DIAGNOSIS — I517 Cardiomegaly: Secondary | ICD-10-CM | POA: Diagnosis not present

## 2016-08-19 DIAGNOSIS — E559 Vitamin D deficiency, unspecified: Secondary | ICD-10-CM | POA: Diagnosis not present

## 2016-08-19 DIAGNOSIS — G4700 Insomnia, unspecified: Secondary | ICD-10-CM | POA: Diagnosis not present

## 2016-08-19 DIAGNOSIS — I6522 Occlusion and stenosis of left carotid artery: Secondary | ICD-10-CM | POA: Diagnosis not present

## 2016-08-19 DIAGNOSIS — I739 Peripheral vascular disease, unspecified: Secondary | ICD-10-CM | POA: Diagnosis not present

## 2016-08-19 DIAGNOSIS — Z79899 Other long term (current) drug therapy: Secondary | ICD-10-CM | POA: Diagnosis not present

## 2016-08-22 ENCOUNTER — Telehealth: Payer: Self-pay | Admitting: *Deleted

## 2016-08-22 NOTE — Telephone Encounter (Signed)
Paula Duncan states she needs to find out about diabetic shoes. I spoke with pt and she states her husband has received rx for diabetic shoes but can't find a facility that makes them. I told her he would need to be evaluated by a podiatrist in our office and if met criteria we could mold and measure in office but the shoe and inserts were sent out for production. Transferred Paula Duncan to schedule husband.

## 2016-09-06 DIAGNOSIS — G903 Multi-system degeneration of the autonomic nervous system: Secondary | ICD-10-CM | POA: Insufficient documentation

## 2016-09-06 DIAGNOSIS — E7800 Pure hypercholesterolemia, unspecified: Secondary | ICD-10-CM | POA: Diagnosis not present

## 2016-09-06 DIAGNOSIS — R0602 Shortness of breath: Secondary | ICD-10-CM | POA: Diagnosis not present

## 2016-09-06 DIAGNOSIS — Z87891 Personal history of nicotine dependence: Secondary | ICD-10-CM | POA: Diagnosis not present

## 2016-09-06 DIAGNOSIS — I10 Essential (primary) hypertension: Secondary | ICD-10-CM | POA: Diagnosis not present

## 2016-09-06 DIAGNOSIS — I493 Ventricular premature depolarization: Secondary | ICD-10-CM | POA: Diagnosis not present

## 2016-09-06 DIAGNOSIS — I951 Orthostatic hypotension: Secondary | ICD-10-CM | POA: Insufficient documentation

## 2016-09-09 ENCOUNTER — Encounter: Payer: Self-pay | Admitting: Pain Medicine

## 2016-09-09 ENCOUNTER — Ambulatory Visit
Admission: RE | Admit: 2016-09-09 | Discharge: 2016-09-09 | Disposition: A | Discharge status: Home / Self Care | Payer: Medicare Other | Source: Ambulatory Visit | Attending: Pain Medicine | Admitting: Pain Medicine

## 2016-09-09 ENCOUNTER — Ambulatory Visit (HOSPITAL_BASED_OUTPATIENT_CLINIC_OR_DEPARTMENT_OTHER): Payer: Medicare Other | Admitting: Pain Medicine

## 2016-09-09 VITALS — BP 100/87 | HR 108 | Temp 97.1°F | Resp 16 | Ht 64.0 in | Wt 115.0 lb

## 2016-09-09 DIAGNOSIS — M4696 Unspecified inflammatory spondylopathy, lumbar region: Secondary | ICD-10-CM

## 2016-09-09 DIAGNOSIS — G894 Chronic pain syndrome: Secondary | ICD-10-CM

## 2016-09-09 DIAGNOSIS — M47816 Spondylosis without myelopathy or radiculopathy, lumbar region: Secondary | ICD-10-CM | POA: Insufficient documentation

## 2016-09-09 DIAGNOSIS — G8929 Other chronic pain: Secondary | ICD-10-CM | POA: Insufficient documentation

## 2016-09-09 DIAGNOSIS — G4701 Insomnia due to medical condition: Secondary | ICD-10-CM | POA: Diagnosis not present

## 2016-09-09 DIAGNOSIS — M545 Low back pain, unspecified: Secondary | ICD-10-CM

## 2016-09-09 DIAGNOSIS — M792 Neuralgia and neuritis, unspecified: Secondary | ICD-10-CM

## 2016-09-09 DIAGNOSIS — R252 Cramp and spasm: Secondary | ICD-10-CM | POA: Diagnosis not present

## 2016-09-09 DIAGNOSIS — M791 Myalgia: Secondary | ICD-10-CM | POA: Diagnosis not present

## 2016-09-09 DIAGNOSIS — Z79891 Long term (current) use of opiate analgesic: Secondary | ICD-10-CM | POA: Diagnosis not present

## 2016-09-09 DIAGNOSIS — F1190 Opioid use, unspecified, uncomplicated: Secondary | ICD-10-CM

## 2016-09-09 DIAGNOSIS — M7918 Myalgia, other site: Secondary | ICD-10-CM

## 2016-09-09 MED ORDER — LACTATED RINGERS IV SOLN
1000.0000 mL | Freq: Once | INTRAVENOUS | Status: AC
  Administered 2016-09-09: 1000 mL via INTRAVENOUS

## 2016-09-09 MED ORDER — GABAPENTIN 100 MG PO CAPS: 100.0000 mg | ORAL_CAPSULE | Freq: Every day | ORAL | 2 refills | Status: DC

## 2016-09-09 MED ORDER — FENTANYL CITRATE (PF) 100 MCG/2ML IJ SOLN
25.0000 ug | INTRAMUSCULAR | Status: DC | PRN
  Administered 2016-09-09: 100 ug via INTRAVENOUS
  Filled 2016-09-09: qty 2

## 2016-09-09 MED ORDER — MIDAZOLAM HCL 5 MG/5ML IJ SOLN
1.0000 mg | INTRAMUSCULAR | Status: DC | PRN
  Administered 2016-09-09: 4 mg via INTRAVENOUS
  Filled 2016-09-09: qty 5

## 2016-09-09 MED ORDER — HYDROCODONE-ACETAMINOPHEN 10-325 MG PO TABS: 1.0000 | ORAL_TABLET | Freq: Two times a day (BID) | ORAL | 0 refills | Status: DC

## 2016-09-09 MED ORDER — TRIAMCINOLONE ACETONIDE 40 MG/ML IJ SUSP
40.0000 mg | Freq: Once | INTRAMUSCULAR | Status: AC
  Administered 2016-09-09: 40 mg
  Filled 2016-09-09: qty 1

## 2016-09-09 MED ORDER — ROPIVACAINE HCL 2 MG/ML IJ SOLN
9.0000 mL | Freq: Once | INTRAMUSCULAR | Status: AC
  Administered 2016-09-09: 9 mL via PERINEURAL
  Filled 2016-09-09: qty 10

## 2016-09-09 MED ORDER — ORPHENADRINE CITRATE ER 100 MG PO TB12: 100.0000 mg | ORAL_TABLET | Freq: Two times a day (BID) | ORAL | 0 refills | Status: DC | PRN

## 2016-09-09 MED ORDER — LIDOCAINE HCL (PF) 1 % IJ SOLN
10.0000 mL | Freq: Once | INTRAMUSCULAR | Status: AC
  Administered 2016-09-09: 5 mL
  Filled 2016-09-09: qty 10

## 2016-09-09 MED ORDER — TRAZODONE HCL 100 MG PO TABS: 100.0000 mg | ORAL_TABLET | Freq: Every day | ORAL | 0 refills | Status: DC

## 2016-09-09 NOTE — Progress Notes (Signed)
Patient's Name: Paula Duncan  MRN: [id number]  Referring Provider: Milinda Pointer, MD  DOB: 09-03-1960  PCP: [name], FNP  DOS: 09/09/2016  Note by: Kathlen Brunswick. Dossie Arbour, MD  Service setting: Ambulatory outpatient  Location: [facility] (AMB) Pain Management Facility  Visit type: Procedure  Specialty: Interventional Pain Management  Patient type: Established   Primary Reason for Visit: Interventional Pain Management Treatment. CC: Back Pain (low)  Procedure:  Anesthesia, Analgesia, Anxiolysis:  Type: Therapeutic Medial Branch Facet Radiofrequency Ablation Region: Lumbar Level: L2, L3, L4, L5, & S1 Medial Branch Level(s) Laterality: Right-Sided  Type: Local Anesthesia with Moderate (Conscious) Sedation Local Anesthetic: Lidocaine 1% Route: Intravenous (IV) IV Access: Secured Sedation: Meaningful verbal contact was maintained at all times during the procedure  Indication(s): Analgesia and Anxiety  Indications: 1. Lumbar facet syndrome (Location of Primary Source of Pain) (Bilateral) (R>L)   2. Chronic low back pain (Location of Primary Source of Pain) (Bilateral) (R>L)   3. Lumbar spondylosis   4. Chronic pain syndrome   5. Long term prescription opiate use   6. Opiate use   7. Neurogenic pain   8. Insomnia secondary to chronic pain   9. Musculoskeletal pain   10. Muscle cramps    Ms. Betsch has either failed to respond, was unable to tolerate, or simply did not get enough benefit from other more conservative therapies including, but not limited to: 1. Over-the-counter medications 2. Anti-inflammatory medications 3. Muscle relaxants 4. Membrane stabilizers 5. Opioids 6. Physical therapy 7. Modalities (Heat, ice, etc.) 8. Invasive techniques such as nerve blocks. Ms. Higinbotham has attained more than 50% relief of the pain from a series of diagnostic injections conducted in separate occasions.  Pain Score: Pre-procedure: 5 /10 Post-procedure: 5 /10  Pre-op Assessment:    Previous date of service: 07/09/16 Service provided: Evaluation Ms. Grilliot is a 56 y.o. (year old), female patient, seen today for interventional treatment. She  has a past surgical history that includes Spine surgery (1994); Abdominal hysterectomy; Exploratory laparotomy; Cesarean section; Esophagogastroduodenoscopy (07/12/09); and left heart catheterization with coronary angiogram (N/A, 04/06/2013). Her primarily concern today is the Back Pain (low)  Initial Vital Signs: Blood pressure 123/60, pulse (!) 108, temperature 98.3 F (36.8 C), resp. rate 18, height 5\' 4"  (1.626 m), weight 115 lb (52.2 kg), SpO2 100 %. BMI: 19.74 kg/m  Risk Assessment: Allergies: Reviewed. She is allergic to nsaids; tramadol hcl; tramadol hcl; chantix [varenicline]; and metronidazole.  Allergy Precautions: None required Coagulopathies: Reviewed. None identified.  Blood-thinner therapy: None at this time Active Infection(s): Reviewed. None identified. Ms. Hedstrom is afebrile  Site Confirmation: Ms. Garrod was asked to confirm the procedure and laterality before marking the site Procedure checklist: Completed Consent: Before the procedure and under the influence of no sedative(s), amnesic(s), or anxiolytics, the patient was informed of the treatment options, risks and possible complications. To fulfill our ethical and legal obligations, as recommended by the American Medical Association's Code of Ethics, I have informed the patient of my clinical impression; the nature and purpose of the treatment or procedure; the risks, benefits, and possible complications of the intervention; the alternatives, including doing nothing; the risk(s) and benefit(s) of the alternative treatment(s) or procedure(s); and the risk(s) and benefit(s) of doing nothing. The patient was provided information about the general risks and possible complications associated with the procedure. These may include, but are not limited to: failure to achieve  desired goals, infection, bleeding, organ or nerve damage, allergic reactions, paralysis, and death. In addition,  the patient was informed of those risks and complications associated to Spine-related procedures, such as failure to decrease pain; infection (i.e.: Meningitis, epidural or intraspinal abscess); bleeding (i.e.: epidural hematoma, subarachnoid hemorrhage, or any other type of intraspinal or peri-dural bleeding); organ or nerve damage (i.e.: Any type of peripheral nerve, nerve root, or spinal cord injury) with subsequent damage to sensory, motor, and/or autonomic systems, resulting in permanent pain, numbness, and/or weakness of one or several areas of the body; allergic reactions; (i.e.: anaphylactic reaction); and/or death. Furthermore, the patient was informed of those risks and complications associated with the medications. These include, but are not limited to: allergic reactions (i.e.: anaphylactic or anaphylactoid reaction(s)); adrenal axis suppression; blood sugar elevation that in diabetics may result in ketoacidosis or comma; water retention that in patients with history of congestive heart failure may result in shortness of breath, pulmonary edema, and decompensation with resultant heart failure; weight gain; swelling or edema; medication-induced neural toxicity; particulate matter embolism and blood vessel occlusion with resultant organ, and/or nervous system infarction; and/or aseptic necrosis of one or more joints. Finally, the patient was informed that Medicine is not an exact science; therefore, there is also the possibility of unforeseen or unpredictable risks and/or possible complications that may result in a catastrophic outcome. The patient indicated having understood very clearly. We have given the patient no guarantees and we have made no promises. Enough time was given to the patient to ask questions, all of which were answered to the patient's satisfaction. Ms. Reine has  indicated that she wanted to continue with the procedure. Attestation: I, the ordering provider, attest that I have discussed with the patient the benefits, risks, side-effects, alternatives, likelihood of achieving goals, and potential problems during recovery for the procedure that I have provided informed consent. Date: 09/09/2016; Time: 8:12 AM  Pre-Procedure Preparation:  Monitoring: As per clinic protocol. Respiration, ETCO2, SpO2, BP, heart rate and rhythm monitor placed and checked for adequate function Safety Precautions: Patient was assessed for positional comfort and pressure points before starting the procedure. Time-out: I initiated and conducted the "Time-out" before starting the procedure, as per protocol. The patient was asked to participate by confirming the accuracy of the "Time Out" information. Verification of the correct person, site, and procedure were performed and confirmed by me, the nursing staff, and the patient. "Time-out" conducted as per Joint Commission's Universal Protocol (UP.01.01.01). "Time-out" Date & Time: 09/09/2016; 0845 hrs.  Description of Procedure Process:   Position: Prone Target Area: For Lumbar Facet blocks, the target is the groove formed by the junction of the transverse process and superior articular process. For the L5 dorsal ramus, the target is the notch between superior articular process and sacral ala. For the S1 dorsal ramus, the target is the superior and lateral edge of the posterior S1 Sacral foramen. Approach: Paraspinal approach. Area Prepped: Entire Posterior Lumbosacral Region Prepping solution: Hibiclens (4.0% Chlorhexidine gluconate solution) Safety Precautions: Aspiration looking for blood return was conducted prior to all injections. At no point did we inject any substances, as a needle was being advanced. No attempts were made at seeking any paresthesias. Safe injection practices and needle disposal techniques used. Medications properly  checked for expiration dates. SDV (single dose vial) medications used. Description of the Procedure: Protocol guidelines were followed. The patient was placed in position over the fluoroscopy table. The target area was identified and the area prepped in the usual manner. Skin desensitized using vapocoolant spray. Skin & deeper tissues infiltrated with local anesthetic.  Appropriate amount of time allowed to pass for local anesthetics to take effect. Radiofrequency needles were introduced to the area of the medial branch at the junction of the superior articular process and transverse process using fluoroscopy. Using the [employer], sensory stimulation using 50 Hz was used to locate & identify the nerve, making sure that the needle was positioned such that there was no sensory stimulation below 0.3 V or above 0.7 V. Stimulation using 2 Hz was used to evaluate the motor component. Care was taken not to lesion any nerves that demonstrated motor stimulation of the lower extremities at an output of less than 2.5 times that of the sensory threshold, or a maximum of 2.0 V. Once satisfactory placement of the needles was achieved, the above solution was slowly injected after negative aspiration. After waiting for at least 2 minutes, the ablation was performed at 80 degrees C for 60 seconds.The needles were then removed and the area cleansed, making sure to leave some of the prepping solution back to take advantage of its long term bactericidal properties. Vitals:   09/09/16 0923 09/09/16 0933 09/09/16 0943 09/09/16 0953  BP: (!) 118/58 119/71 132/72 100/87  Pulse:      Resp: 14 12 14 16   Temp:  97.1 F (36.2 C)    SpO2: 97% 99% 99% 100%  Weight:      Height:        Start Time: 0845 hrs. End Time: 0923 hrs. Materials & Medications:  Needle(s) Type: Teflon-coated, curved tip, Radiofrequency needle(s) Gauge: 22G Length: 10cm Medication(s): We administered lactated ringers, midazolam,  fentaNYL, triamcinolone acetonide, lidocaine (PF), and ropivacaine (PF) 2 mg/mL (0.2%). Please see chart orders for dosing details.  Imaging Guidance (Spinal):  Type of Imaging Technique: Fluoroscopy Guidance (Spinal) Indication(s): Assistance in needle guidance and placement for procedures requiring needle placement in or near specific anatomical locations not easily accessible without such assistance. Exposure Time: Please see nurses notes. Contrast: None used. Fluoroscopic Guidance: I was personally present during the use of fluoroscopy. "Tunnel Vision Technique" used to obtain the best possible view of the target area. Parallax error corrected before commencing the procedure. "Direction-depth-direction" technique used to introduce the needle under continuous pulsed fluoroscopy. Once target was reached, antero-posterior, oblique, and lateral fluoroscopic projection used confirm needle placement in all planes. Images permanently stored in EMR. Interpretation: No contrast injected. I personally interpreted the imaging intraoperatively. Adequate needle placement confirmed in multiple planes. Permanent images saved into the patient's record.  Antibiotic Prophylaxis:  Indication(s): None identified Antibiotic given: None  Post-operative Assessment:  EBL: None Complications: No immediate post-treatment complications observed by team, or reported by patient. Note: The patient tolerated the entire procedure well. A repeat set of vitals were taken after the procedure and the patient was kept under observation following institutional policy, for this type of procedure. Post-procedural neurological assessment was performed, showing return to baseline, prior to discharge. The patient was provided with post-procedure discharge instructions, including a section on how to identify potential problems. Should any problems arise concerning this procedure, the patient was given instructions to immediately contact  us, at any time, without hesitation. In any case, we plan to contact the patient by telephone for a follow-up status report regarding this interventional procedure. Comments:  No additional relevant information.  Plan of Care  Disposition: Discharge home  Discharge Date & Time: 09/09/2016; 0956 hrs.  Physician-requested Follow-up:  Return in about 2 weeks (around 09/23/2016) for contralateral RF (in 2-wks), by MD, in addition,  Med-Mgmt, w/ NP, before 12/22/16..  Future Appointments Date Time Provider [place]  10/09/2016 2:00 PM Milinda Pointer, MD [facility] None  12/16/2016 9:15 AM Vevelyn Francois, NP [facility] None   Medications ordered for procedure: Meds ordered this encounter  Medications  . lactated ringers infusion 1,000 mL  . midazolam (VERSED) 5 MG/5ML injection 1-2 mg    Make sure Flumazenil is available in the pyxis when using this medication. If oversedation occurs, administer 0.2 mg IV over 15 sec. If after 45 sec no response, administer 0.2 mg again over 1 min; may repeat at 1 min intervals; not to exceed 4 doses (1 mg)  . fentaNYL (SUBLIMAZE) injection 25-50 mcg    Make sure Narcan is available in the pyxis when using this medication. In the event of respiratory depression (RR< 8/min): Titrate NARCAN (naloxone) in increments of 0.1 to 0.2 mg IV at 2-3 minute intervals, until desired degree of reversal.  . triamcinolone acetonide (KENALOG-40) injection 40 mg  . lidocaine (PF) (XYLOCAINE) 1 % injection 10 mL  . ropivacaine (PF) 2 mg/mL (0.2%) (NAROPIN) injection 9 mL  . HYDROcodone-acetaminophen (LORCET HD) 10-325 MG tablet    Sig: Take 1 tablet by mouth 2 (two) times daily after a meal.    Dispense:  60 tablet    Refill:  0    Do not add this medication to the electronic "Automatic Refill" notification system. Patient may have prescription filled one day early if pharmacy is closed on scheduled refill date. Do not fill until: 10/28/16 To last until: 11/27/16  .  HYDROcodone-acetaminophen (LORCET HD) 10-325 MG tablet    Sig: Take 1 tablet by mouth 2 (two) times daily after a meal.    Dispense:  60 tablet    Refill:  0    Do not add this medication to the electronic "Automatic Refill" notification system. Patient may have prescription filled one day early if pharmacy is closed on scheduled refill date. Do not fill until: 09/28/16 To last until: 10/28/16  . HYDROcodone-acetaminophen (LORCET HD) 10-325 MG tablet    Sig: Take 1 tablet by mouth 2 (two) times daily after a meal.    Dispense:  60 tablet    Refill:  0    Do not add this medication to the electronic "Automatic Refill" notification system. Patient may have prescription filled one day early if pharmacy is closed on scheduled refill date. Do not fill until: 11/27/16 To last until: 12/27/16  . traZODone (DESYREL) 100 MG tablet    Sig: Take 1 tablet (100 mg total) by mouth at bedtime.    Dispense:  90 tablet    Refill:  0    Do not add this medication to the electronic "Automatic Refill" notification system. Patient may have prescription filled one day early if pharmacy is closed on scheduled refill date.  . orphenadrine (NORFLEX) 100 MG tablet    Sig: Take 1 tablet (100 mg total) by mouth 2 (two) times daily as needed for muscle spasms.    Dispense:  180 tablet    Refill:  0    Do not add this medication to the electronic "Automatic Refill" notification system. Patient may have prescription filled one day early if pharmacy is closed on scheduled refill date.  . gabapentin (NEURONTIN) 100 MG capsule    Sig: Take 1-3 capsules (100-300 mg total) by mouth at bedtime.    Dispense:  90 capsule    Refill:  2    Do not add this  medication to the electronic "Automatic Refill" notification system. Patient may have prescription filled one day early if pharmacy is closed on scheduled refill date.   Medications administered: We administered lactated ringers, midazolam, fentaNYL, triamcinolone acetonide,  lidocaine (PF), and ropivacaine (PF) 2 mg/mL (0.2%).  See the medical record for exact dosing, route, and time of administration.  Lab-work, Procedure(s), & Referral(s) Ordered: Orders Placed This Encounter  Procedures  . DG C-Arm 1-60 Min-No Report  . Informed Consent Details: Transcribe to consent form and obtain patient signature  . Provider attestation of informed consent for procedure/surgical case  . Verify informed consent  . Discharge instructions  . Follow-up   Imaging Ordered: Results for orders placed in visit on 05/20/16  DG C-Arm 1-60 Min-No Report   Narrative There is no Radiologist interpretation  for this exam.   New Prescriptions   No medications on file   Primary Care Physician: [name], FNP Location: [facility] Note by: Kathlen Brunswick. Dossie Arbour, M.D, DABA, DABAPM, DABPM, DABIPP, FIPP Date: 09/09/2016; Time: 11:33 AM  Disclaimer:  Medicine is not an exact science. The only guarantee in medicine is that nothing is guaranteed. It is important to note that the decision to proceed with this intervention was based on the information collected from the patient. The Data and conclusions were drawn from the patient's questionnaire, the interview, and the physical examination. Because the information was provided in large part by the patient, it cannot be guaranteed that it has not been purposely or unconsciously manipulated. Every effort has been made to obtain as much relevant data as possible for this evaluation. It is important to note that the conclusions that lead to this procedure are derived in large part from the available data. Always take into account that the treatment will also be dependent on availability of resources and existing treatment guidelines, considered by other Pain Management Practitioners as being common knowledge and practice, at the time of the intervention. For Medico-Legal purposes, it is also important to point out  that variation in procedural techniques and pharmacological choices are the acceptable norm. The indications, contraindications, technique, and results of the above procedure should only be interpreted and judged by a Board-Certified Interventional Pain Specialist with extensive familiarity and expertise in the same exact procedure and technique.  Instructions provided at this appointment: Patient Instructions   Steps to Quit Smoking Smoking tobacco can be bad for your health. It can also affect almost every organ in your body. Smoking puts you and people around you at risk for many serious long-lasting (chronic) diseases. Quitting smoking is hard, but it is one of the best things that you can do for your health. It is never too late to quit. What are the benefits of quitting smoking? When you quit smoking, you lower your risk for getting serious diseases and conditions. They can include:  Lung cancer or lung disease.  Heart disease.  Stroke.  Heart attack.  Not being able to have children (infertility).  Weak bones (osteoporosis) and broken bones (fractures). If you have coughing, wheezing, and shortness of breath, those symptoms may get better when you quit. You may also get sick less often. If you are pregnant, quitting smoking can help to lower your chances of having a baby of low birth weight. What can I do to help me quit smoking? Talk with your doctor about what can help you quit smoking. Some things you can do (strategies) include:  Quitting smoking totally, instead of  slowly cutting back how much you smoke over a period of time.  Going to in-person counseling. You are more likely to quit if you go to many counseling sessions.  Using resources and support systems, such as:  Online chats with a Social worker.  Phone quitlines.  Printed Furniture conservator/restorer.  Support groups or group counseling.  Text messaging programs.  Mobile phone apps or applications.  Taking  medicines. Some of these medicines may have nicotine in them. If you are pregnant or breastfeeding, do not take any medicines to quit smoking unless your doctor says it is okay. Talk with your doctor about counseling or other things that can help you. Talk with your doctor about using more than one strategy at the same time, such as taking medicines while you are also going to in-person counseling. This can help make quitting easier. What things can I do to make it easier to quit? Quitting smoking might feel very hard at first, but there is a lot that you can do to make it easier. Take these steps:  Talk to your family and friends. Ask them to support and encourage you.  Call phone quitlines, reach out to support groups, or work with a Social worker.  Ask people who smoke to not smoke around you.  Avoid places that make you want (trigger) to smoke, such as:  Bars.  Parties.  Smoke-break areas at work.  Spend time with people who do not smoke.  Lower the stress in your life. Stress can make you want to smoke. Try these things to help your stress:  Getting regular exercise.  Deep-breathing exercises.  Yoga.  Meditating.  Doing a body scan. To do this, close your eyes, focus on one area of your body at a time from head to toe, and notice which parts of your body are tense. Try to relax the muscles in those areas.  Download or buy apps on your mobile phone or tablet that can help you stick to your quit plan. There are many free apps, such as QuitGuide from the [employer] Office manager for Disease Control and Prevention). You can find more support from smokefree.gov and other websites. This information is not intended to replace advice given to you by your health care provider. Make sure you discuss any questions you have with your health care provider. Document Released: 02/16/2009 Document Revised: 12/19/2015 Document Reviewed: 09/06/2014 Elsevier Interactive Patient Education  2017 [place]. Post-Procedure instructions Instructions:  Apply ice: Fill a plastic sandwich bag with crushed ice. Cover it with a small towel and apply to injection site. Apply for 15 minutes then remove x 15 minutes. Repeat sequence on day of procedure, until you go to bed. The purpose is to minimize swelling and discomfort after procedure.  Apply heat: Apply heat to procedure site starting the day following the procedure. The purpose is to treat any soreness and discomfort from the procedure.  Food intake: Start with clear liquids (like water) and advance to regular food, as tolerated.   Physical activities: Keep activities to a minimum for the first 8 hours after the procedure.   Driving: If you have received any sedation, you are not allowed to drive for 24 hours after your procedure.  Blood thinner: Restart your blood thinner 6 hours after your procedure. (Only for those taking blood thinners)  Insulin: As soon as you can eat, you may resume your normal dosing schedule. (Only for those taking insulin)  Infection prevention: Keep procedure site clean and dry.  Post-procedure Pain Diary: Extremely important that this be done correctly and accurately. Recorded information will be used to determine the next step in treatment.  Pain evaluated is that of treated area only. Do not include pain from an untreated area.  Complete every hour, on the hour, for the initial 8 hours. Set an alarm to help you do this part accurately.  Do not go to sleep and have it completed later. It will not be accurate.  Follow-up appointment: Keep your follow-up appointment after the procedure. Usually 2 weeks for most procedures. (6 weeks in the case of radiofrequency.) Bring you pain diary.  Expect:  From numbing medicine (AKA: Local Anesthetics): Numbness or decrease in pain.  Onset: Full effect within 15 minutes of injected.  Duration: It will depend on the type of local anesthetic used. On the average, 1 to 8  hours.   From steroids: Decrease in swelling or inflammation. Once inflammation is improved, relief of the pain will follow.  Onset of benefits: Depends on the amount of swelling present. The more swelling, the longer it will take for the benefits to be seen.   Duration: Steroids will stay in the system x 2 weeks. Duration of benefits will depend on multiple posibilities including persistent irritating factors.  From procedure: Some discomfort is to be expected once the numbing medicine wears off. This should be minimal if ice and heat are applied as instructed. Call if:  You experience numbness and weakness that gets worse with time, as opposed to wearing off.  New onset bowel or bladder incontinence. (Spinal procedures only)  Emergency Numbers:  Durning business hours (Monday - Thursday, 8:00 AM - 4:00 PM) (Friday, 9:00 AM - 12:00 Noon): [phone]  After hours: [phone] _____________________________________________________________________________________________  Pain Management Discharge Instructions  General Discharge Instructions :  If you need to reach your doctor call: Monday-Friday 8:00 am - 4:00 pm at [phone] or toll free [phone].  After clinic hours [phone] to have operator reach doctor.  Bring all of your medication bottles to all your appointments in the pain clinic.  To cancel or reschedule your appointment with Pain Management please remember to call 24 hours in advance to avoid a fee.  Refer to the educational materials which you have been given on: General Risks, I had my Procedure. Discharge Instructions, Post Sedation.  Post Procedure Instructions:  The drugs you were given will stay in your system until tomorrow, so for the next 24 hours you should not drive, make any legal decisions or drink any alcoholic beverages.  You may eat anything you prefer, but it is better to start with liquids then soups and crackers, and gradually work  up to solid foods.  Please notify your doctor immediately if you have any unusual bleeding, trouble breathing or pain that is not related to your normal pain.  Depending on the type of procedure that was done, some parts of your body may feel week and/or numb.  This usually clears up by tonight or the next day.  Walk with the use of an assistive device or accompanied by an adult for the 24 hours.  You may use ice on the affected area for the first 24 hours.  Put ice in a Ziploc bag and cover with a towel and place against area 15 minutes on 15 minutes off.  You may switch to heat after 24 hours.Radiofrequency Lesioning, Care After Refer to this sheet in the next few weeks. These instructions provide you with information  about caring for yourself after your procedure. Your health care provider may also give you more specific instructions. Your treatment has been planned according to current medical practices, but problems sometimes occur. Call your health care provider if you have any problems or questions after your procedure. What can I expect after the procedure? After the procedure, it is common to have:  Pain from the burned nerve.  Temporary numbness. Follow these instructions at home:  Take over-the-counter and prescription medicines only as told by your health care provider.  Return to your normal activities as told by your health care provider. Ask your health care provider what activities are safe for you.  Pay close attention to how you feel after the procedure. If you start to have pain, write down when it hurts and how it feels. This will help you and your health care provider to know if you need an additional treatment.  Check your needle insertion site every day for signs of infection. Watch for:  Redness, swelling, or pain.  Fluid, blood, or pus.  Keep all follow-up visits as told by your health care provider. This is important. Contact a health care provider if:  Your  pain does not get better.  You have redness, swelling, or pain at the needle insertion site.  You have fluid, blood, or pus coming from the needle insertion site.  You have a fever. Get help right away if:  You develop sudden, severe pain.  You develop numbness or tingling near the procedure site that does not go away. This information is not intended to replace advice given to you by your health care provider. Make sure you discuss any questions you have with your health care provider. Document Released: 12/20/2010 Document Revised: 09/28/2015 Document Reviewed: 05/30/2014 Elsevier Interactive Patient Education  2017 [place]. Radiofrequency Lesioning Radiofrequency lesioning is a procedure that is performed to relieve pain. The procedure is often used for back, neck, or arm pain. Radiofrequency lesioning involves the use of a machine that creates radio waves to make heat. During the procedure, the heat is applied to the nerve that carries the pain signal. The heat damages the nerve and interferes with the pain signal. Pain relief usually starts about 2 weeks after the procedure and lasts for 6 months to 1 year. Tell a health care provider about:  Any allergies you have.  All medicines you are taking, including vitamins, herbs, eye drops, creams, and over-the-counter medicines.  Any problems you or family members have had with anesthetic medicines.  Any blood disorders you have.  Any surgeries you have had.  Any medical conditions you have.  Whether you are pregnant or may be pregnant. What are the risks? Generally, this is a safe procedure. However, problems may occur, including:  Pain or soreness at the injection site.  Infection at the injection site.  Damage to nerves or blood vessels. What happens before the procedure?  Ask your health care provider about:  Changing or stopping your regular medicines. This is especially important if you are taking diabetes  medicines or blood thinners.  Taking medicines such as aspirin and ibuprofen. These medicines can thin your blood. Do not take these medicines before your procedure if your health care provider instructs you not to.  Follow instructions from your health care provider about eating or drinking restrictions.  Plan to have someone take you home after the procedure.  If you go home right after the procedure, plan to have someone with you for  24 hours. What happens during the procedure?  You will be given one or more of the following:  A medicine to help you relax (sedative).  A medicine to numb the area (local anesthetic).  You will be awake during the procedure. You will need to be able to talk with the health care provider during the procedure.  With the help of a type of X-ray (fluoroscopy), the health care provider will insert a radiofrequency needle into the area to be treated.  Next, a wire that carries the radio waves (electrode) will be put through the radiofrequency needle. An electrical pulse will be sent through the electrode to verify the correct nerve. You will feel a tingling sensation, and you may have muscle twitching.  Then, the tissue that is around the needle tip will be heated by an electric current that is passed using the radiofrequency machine. This will numb the nerves.  A bandage (dressing) will be put on the insertion area after the procedure is done. The procedure may vary among health care providers and hospitals. What happens after the procedure?  Your blood pressure, heart rate, breathing rate, and blood oxygen level will be monitored often until the medicines you were given have worn off.  Return to your normal activities as directed by your health care provider. This information is not intended to replace advice given to you by your health care provider. Make sure you discuss any questions you have with your health care provider. Document Released:  12/19/2010 Document Revised: 09/28/2015 Document Reviewed: 05/30/2014 Elsevier Interactive Patient Education  2017 [place]  What are the risk, side effects and possible complications? Generally speaking, most procedures are safe.  However, with any procedure there are risks, side effects, and the possibility of complications.  The risks and complications are dependent upon the sites that are lesioned, or the type of nerve block to be performed.  The closer the procedure is to the spine, the more serious the risks are.  Great care is taken when placing the radio frequency needles, block needles or lesioning probes, but sometimes complications can occur. 1. Infection: Any time there is an injection through the skin, there is a risk of infection.  This is why sterile conditions are used for these blocks.  There are four possible types of infection. 1. Localized skin infection. 2. Central Nervous System Infection-This can be in the form of Meningitis, which can be deadly. 3. Epidural Infections-This can be in the form of an epidural abscess, which can cause pressure inside of the spine, causing compression of the spinal cord with subsequent paralysis. This would require an emergency surgery to decompress, and there are no guarantees that the patient would recover from the paralysis. 4. Discitis-This is an infection of the intervertebral discs.  It occurs in about 1% of discography procedures.  It is difficult to treat and it may lead to surgery.        2. Pain: the needles have to go through skin and soft tissues, will cause soreness.       3. Damage to internal structures:  The nerves to be lesioned may be near blood vessels or    other nerves which can be potentially damaged.       4. Bleeding: Bleeding is more common if the patient is taking blood thinners such as  aspirin, Coumadin, Ticiid, Plavix, etc., or if he/she have some genetic predisposition  such as  hemophilia. Bleeding into the spinal canal  can cause compression of the spinal  cord with subsequent paralysis.  This would require an emergency surgery to  decompress and there are no guarantees that the patient would recover from the  paralysis.       5. Pneumothorax:  Puncturing of a lung is a possibility, every time a needle is introduced in  the area of the chest or upper back.  Pneumothorax refers to free air around the  collapsed lung(s), inside of the thoracic cavity (chest cavity).  Another two possible  complications related to a similar event would include: Hemothorax and Chylothorax.   These are variations of the Pneumothorax, where instead of air around the collapsed  lung(s), you may have blood or chyle, respectively.       6. Spinal headaches: They may occur with any procedures in the area of the spine.       7. Persistent CSF (Cerebro-Spinal Fluid) leakage: This is a rare problem, but may occur  with prolonged intrathecal or epidural catheters either due to the formation of a fistulous  track or a dural tear.       8. Nerve damage: By working so close to the spinal cord, there is always a possibility of  nerve damage, which could be as serious as a permanent spinal cord injury with  paralysis.       9. Death:  Although rare, severe deadly allergic reactions known as "Anaphylactic  reaction" can occur to any of the medications used.      10. Worsening of the symptoms:  We can always make thing worse.  What are the chances of something like this happening? Chances of any of this occuring are extremely low.  By statistics, you have more of a chance of getting killed in a motor vehicle accident: while driving to the hospital than any of the above occurring .  Nevertheless, you should be aware that they are possibilities.  In general, it is similar to taking a shower.  Everybody knows that you can slip, hit your head and get killed.  Does that mean that you should not shower again?  Nevertheless  always keep in mind that statistics do not mean anything if you happen to be on the wrong side of them.  Even if a procedure has a 1 (one) in a 1,000,000 (million) chance of going wrong, it you happen to be that one..Also, keep in mind that by statistics, you have more of a chance of having something go wrong when taking medications.  Who should not have this procedure? If you are on a blood thinning medication (e.g. Coumadin, Plavix, see list of "Blood Thinners"), or if you have an active infection going on, you should not have the procedure.  If you are taking any blood thinners, please inform your physician.  How should I prepare for this procedure?  Do not eat or drink anything at least six hours prior to the procedure.  Bring a driver with you .  It cannot be a taxi.  Come accompanied by an adult that can drive you back, and that is strong enough to help you if your legs get weak or numb from the local anesthetic.  Take all of your medicines the morning of the procedure with just enough water to swallow them.  If you have diabetes, make sure that you are scheduled to have your procedure done first thing in the morning, whenever possible.  If you have diabetes, take only half of your insulin dose  and notify our nurse that you have done so as soon as you arrive at the clinic.  If you are diabetic, but only take blood sugar pills (oral hypoglycemic), then do not take them on the morning of your procedure.  You may take them after you have had the procedure.  Do not take aspirin or any aspirin-containing medications, at least eleven (11) days prior to the procedure.  They may prolong bleeding.  Wear loose fitting clothing that may be easy to take off and that you would not mind if it got stained with Betadine or blood.  Do not wear any jewelry or perfume  Remove any nail coloring.  It will interfere with some of our monitoring equipment.  NOTE: Remember that this is not meant to be  interpreted as a complete list of all possible complications.  Unforeseen problems may occur.  BLOOD THINNERS The following drugs contain aspirin or other products, which can cause increased bleeding during surgery and should not be taken for 2 weeks prior to and 1 week after surgery.  If you should need take something for relief of minor pain, you may take acetaminophen which is found in Tylenol,m Datril, Anacin-3 and Panadol. It is not blood thinner. The products listed below are.  Do not take any of the products listed below in addition to any listed on your instruction sheet.  A.P.C or A.P.C with Codeine Codeine Phosphate Capsules #3 Ibuprofen Ridaura  ABC compound Congesprin Imuran rimadil  Advil Cope Indocin Robaxisal  Alka-Seltzer Effervescent Pain Reliever and Antacid Coricidin or Coricidin-D  Indomethacin Rufen  Alka-Seltzer plus Cold Medicine Cosprin Ketoprofen S-A-C Tablets  Anacin Analgesic Tablets or Capsules Coumadin Korlgesic Salflex  Anacin Extra Strength Analgesic tablets or capsules CP-2 Tablets Lanoril Salicylate  Anaprox Cuprimine Capsules Levenox Salocol  Anexsia-D Dalteparin Magan Salsalate  Anodynos Darvon compound Magnesium Salicylate Sine-off  Ansaid Dasin Capsules Magsal Sodium Salicylate  Anturane Depen Capsules Marnal Soma  APF Arthritis pain formula Dewitt's Pills Measurin Stanback  Argesic Dia-Gesic Meclofenamic Sulfinpyrazone  Arthritis Bayer Timed Release Aspirin Diclofenac Meclomen Sulindac  Arthritis pain formula Anacin Dicumarol Medipren Supac  Analgesic (Safety coated) Arthralgen Diffunasal Mefanamic Suprofen  Arthritis Strength Bufferin Dihydrocodeine Mepro Compound Suprol  Arthropan liquid Dopirydamole Methcarbomol with Aspirin Synalgos  ASA tablets/Enseals Disalcid Micrainin Tagament  Ascriptin Doan's Midol Talwin  Ascriptin A/D Dolene Mobidin Tanderil  Ascriptin Extra Strength Dolobid Moblgesic Ticlid  Ascriptin with Codeine Doloprin or Doloprin  with Codeine Momentum Tolectin  Asperbuf Duoprin Mono-gesic Trendar  Aspergum Duradyne Motrin or Motrin IB Triminicin  Aspirin plain, buffered or enteric coated Durasal Myochrisine Trigesic  Aspirin Suppositories Easprin Nalfon Trillsate  Aspirin with Codeine Ecotrin Regular or Extra Strength Naprosyn Uracel  Atromid-S Efficin Naproxen Ursinus  Auranofin Capsules Elmiron Neocylate Vanquish  Axotal Emagrin Norgesic Verin  Azathioprine Empirin or Empirin with Codeine Normiflo Vitamin E  Azolid Emprazil Nuprin Voltaren  Bayer Aspirin plain, buffered or children's or timed BC Tablets or powders Encaprin Orgaran Warfarin Sodium  Buff-a-Comp Enoxaparin Orudis Zorpin  Buff-a-Comp with Codeine Equegesic Os-Cal-Gesic   Buffaprin Excedrin plain, buffered or Extra Strength Oxalid   Bufferin Arthritis Strength Feldene Oxphenbutazone   Bufferin plain or Extra Strength Feldene Capsules Oxycodone with Aspirin   Bufferin with Codeine Fenoprofen Fenoprofen Pabalate or Pabalate-SF   Buffets II Flogesic Panagesic   Buffinol plain or Extra Strength Florinal or Florinal with Codeine Panwarfarin   Buf-Tabs Flurbiprofen Penicillamine   Butalbital Compound Four-way cold tablets Penicillin   Butazolidin Fragmin Pepto-Bismol  Carbenicillin Geminisyn Percodan   Carna Arthritis Reliever Geopen Persantine   Carprofen Gold's salt Persistin   Chloramphenicol Goody's Phenylbutazone   Chloromycetin Haltrain Piroxlcam   Clmetidine heparin Plaquenil   Cllnoril Hyco-pap Ponstel   Clofibrate Hydroxy chloroquine Propoxyphen         Before stopping any of these medications, be sure to consult the physician who ordered them.  Some, such as Coumadin (Warfarin) are ordered to prevent or treat serious conditions such as "deep thrombosis", "pumonary embolisms", and other heart problems.  The amount of time that you may need off of the medication may also vary with the medication and the reason for which you were taking it.   If you are taking any of these medications, please make sure you notify your pain physician before you undergo any procedures.   Medication Rules  Applies to: All patients receiving prescriptions (written or electronic).  Pharmacy of record: Pharmacy where electronic prescriptions will be sent. If written prescriptions are taken to a different pharmacy, please inform the nursing staff. The pharmacy listed in the electronic medical record should be the one where you would like electronic prescriptions to be sent.  Prescription refills: Only during scheduled appointments. Applies to both, written and electronic prescriptions.  NOTE: The following applies primarily to controlled substances (Opioid Pain Medications)  Patient's responsibilities: 1. Pain Pills: Bring all pain pills to every appointment (except for procedure appointments). 2. Pill Bottles: Bring pills in original pharmacy bottle. Always bring newest bottle. Bring bottle, even if empty. 3. Medication refills: You are responsible for knowing and keeping track of what medications you need refilled. The day before your appointment, write a list of all prescriptions that need to be refilled. Bring that list to your appointment and give it to the admitting nurse. Prescriptions will be written only during appointments. If you forget a medication, it will not be "Called in", "Faxed", or "electronically sent". You will need to get another appointment to get these prescribed. 4. Prescription Accuracy: You are responsible for carefully inspecting your prescriptions before leaving our office. Have the discharge nurse carefully go over each prescription with you, before taking them home. Make sure that your name is accurately spelled, that your address is correct. Check the name and dose of your medication to make sure it is accurate. Check the number of pills, and the written instructions to make sure they are clear and accurate. Make sure that you  are given enough medication to last until your next medication refill appointment. 5. Taking Medication: Take medication as prescribed. Never take more pills than instructed. Never take medication more frequently than prescribed. Taking less pills or less frequently is permitted and encouraged, when it comes to controlled substances (written prescriptions).  6. Inform other Doctors: Always inform, all of your healthcare providers, of all the medications you take. 7. Pain Medication from other Providers: You are not allowed to accept any additional pain medication from any other Doctor or Healthcare provider. There are two exceptions to this rule. (see below) In the event that you require additional pain medication, you are responsible for notifying us, as stated below. 8. Medication Agreement: You are responsible for carefully reading and following our Medication Agreement. This must be signed before receiving any prescriptions from our practice. Safely store a copy of your signed Agreement. Violations to the Agreement will result in no further prescriptions. (Additional copies of our Medication Agreement are available upon request.) 9. Laws, Rules, & Regulations: All patients are expected to  follow all Federal and [employer], Rules, & Regulations. Ignorance of the Laws does not constitute a valid excuse.  Exceptions: There are only two exceptions to the rule of not receiving pain medications from other Healthcare Providers. 1. Exception #1 (Emergencies): In the event of an emergency (i.e.: accident requiring emergency care), you are allowed to receive additional pain medication. However, you are responsible for: As soon as you are able, call our office [phone], at any time of the day or night, and leave a message stating your name, the date and nature of the emergency, and the name and dose of the medication prescribed. In the event that your call is answered by a member of our staff, make  sure to document and save the date, time, and the name of the person that took your information.  2. Exception #2 (Planned Surgery): In the event that you are scheduled by another doctor or dentist to have any type of surgery or procedure, you are allowed (for a period no longer than 30 days), to receive additional pain medication, for the acute post-op pain. However, in this case, you are responsible for picking up a copy of our "Post-op Pain Management for Surgeons" handout, and giving it to your surgeon or dentist. This document is available at our office, and does not require an appointment to obtain it. Simply go to our office during business hours (Monday-Thursday from 8:00 AM to 4:00 PM) (Friday 8:00 AM to 12:00 Noon) or if you have a scheduled appointment with [place], prior to your surgery, and ask for it by name. In addition, you will need to provide [place] with your name, name of your surgeon, type of surgery, and date of procedure or surgery.  _____________________________________________________________________________________________

## 2016-09-09 NOTE — Patient Instructions (Addendum)
Steps to Quit Smoking Smoking tobacco can be bad for your health. It can also affect almost every organ in your body. Smoking puts you and people around you at risk for many serious long-lasting (chronic) diseases. Quitting smoking is hard, but it is one of the best things that you can do for your health. It is never too late to quit. What are the benefits of quitting smoking? When you quit smoking, you lower your risk for getting serious diseases and conditions. They can include:  Lung cancer or lung disease.  Heart disease.  Stroke.  Heart attack.  Not being able to have children (infertility).  Weak bones (osteoporosis) and broken bones (fractures). If you have coughing, wheezing, and shortness of breath, those symptoms may get better when you quit. You may also get sick less often. If you are pregnant, quitting smoking can help to lower your chances of having a baby of low birth weight. What can I do to help me quit smoking? Talk with your doctor about what can help you quit smoking. Some things you can do (strategies) include:  Quitting smoking totally, instead of slowly cutting back how much you smoke over a period of time.  Going to in-person counseling. You are more likely to quit if you go to many counseling sessions.  Using resources and support systems, such as:  Online chats with a counselor.  Phone quitlines.  Printed self-help materials.  Support groups or group counseling.  Text messaging programs.  Mobile phone apps or applications.  Taking medicines. Some of these medicines may have nicotine in them. If you are pregnant or breastfeeding, do not take any medicines to quit smoking unless your doctor says it is okay. Talk with your doctor about counseling or other things that can help you. Talk with your doctor about using more than one strategy at the same time, such as taking medicines while you are also going to in-person counseling. This can help make quitting  easier. What things can I do to make it easier to quit? Quitting smoking might feel very hard at first, but there is a lot that you can do to make it easier. Take these steps:  Talk to your family and friends. Ask them to support and encourage you.  Call phone quitlines, reach out to support groups, or work with a counselor.  Ask people who smoke to not smoke around you.  Avoid places that make you want (trigger) to smoke, such as:  Bars.  Parties.  Smoke-break areas at work.  Spend time with people who do not smoke.  Lower the stress in your life. Stress can make you want to smoke. Try these things to help your stress:  Getting regular exercise.  Deep-breathing exercises.  Yoga.  Meditating.  Doing a body scan. To do this, close your eyes, focus on one area of your body at a time from head to toe, and notice which parts of your body are tense. Try to relax the muscles in those areas.  Download or buy apps on your mobile phone or tablet that can help you stick to your quit plan. There are many free apps, such as QuitGuide from the CDC (Centers for Disease Control and Prevention). You can find more support from smokefree.gov and other websites. This information is not intended to replace advice given to you by your health care provider. Make sure you discuss any questions you have with your health care provider. Document Released: 02/16/2009 Document Revised: 12/19/2015 Document   Reviewed: 09/06/2014 Elsevier Interactive Patient Education  2017 [place]. Post-Procedure instructions Instructions:  Apply ice: Fill a plastic sandwich bag with crushed ice. Cover it with a small towel and apply to injection site. Apply for 15 minutes then remove x 15 minutes. Repeat sequence on day of procedure, until you go to bed. The purpose is to minimize swelling and discomfort after procedure.  Apply heat: Apply heat to procedure site starting the day following the procedure. The purpose  is to treat any soreness and discomfort from the procedure.  Food intake: Start with clear liquids (like water) and advance to regular food, as tolerated.   Physical activities: Keep activities to a minimum for the first 8 hours after the procedure.   Driving: If you have received any sedation, you are not allowed to drive for 24 hours after your procedure.  Blood thinner: Restart your blood thinner 6 hours after your procedure. (Only for those taking blood thinners)  Insulin: As soon as you can eat, you may resume your normal dosing schedule. (Only for those taking insulin)  Infection prevention: Keep procedure site clean and dry.  Post-procedure Pain Diary: Extremely important that this be done correctly and accurately. Recorded information will be used to determine the next step in treatment.  Pain evaluated is that of treated area only. Do not include pain from an untreated area.  Complete every hour, on the hour, for the initial 8 hours. Set an alarm to help you do this part accurately.  Do not go to sleep and have it completed later. It will not be accurate.  Follow-up appointment: Keep your follow-up appointment after the procedure. Usually 2 weeks for most procedures. (6 weeks in the case of radiofrequency.) Bring you pain diary.  Expect:  From numbing medicine (AKA: Local Anesthetics): Numbness or decrease in pain.  Onset: Full effect within 15 minutes of injected.  Duration: It will depend on the type of local anesthetic used. On the average, 1 to 8 hours.   From steroids: Decrease in swelling or inflammation. Once inflammation is improved, relief of the pain will follow.  Onset of benefits: Depends on the amount of swelling present. The more swelling, the longer it will take for the benefits to be seen.   Duration: Steroids will stay in the system x 2 weeks. Duration of benefits will depend on multiple posibilities including persistent irritating factors.  From  procedure: Some discomfort is to be expected once the numbing medicine wears off. This should be minimal if ice and heat are applied as instructed. Call if:  You experience numbness and weakness that gets worse with time, as opposed to wearing off.  New onset bowel or bladder incontinence. (Spinal procedures only)  Emergency Numbers:  Durning business hours (Monday - Thursday, 8:00 AM - 4:00 PM) (Friday, 9:00 AM - 12:00 Noon): [phone]  After hours: [phone] _____________________________________________________________________________________________  Pain Management Discharge Instructions  General Discharge Instructions :  If you need to reach your doctor call: Monday-Friday 8:00 am - 4:00 pm at [phone] or toll free [phone].  After clinic hours [phone] to have operator reach doctor.  Bring all of your medication bottles to all your appointments in the pain clinic.  To cancel or reschedule your appointment with Pain Management please remember to call 24 hours in advance to avoid a fee.  Refer to the educational materials which you have been given on: General Risks, I had my Procedure. Discharge Instructions, Post Sedation.  Post Procedure Instructions:  The drugs you were given will stay in your system until tomorrow, so for the next 24 hours you should not drive, make any legal decisions or drink any alcoholic beverages.  You may eat anything you prefer, but it is better to start with liquids then soups and crackers, and gradually work up to solid foods.  Please notify your doctor immediately if you have any unusual bleeding, trouble breathing or pain that is not related to your normal pain.  Depending on the type of procedure that was done, some parts of your body may feel week and/or numb.  This usually clears up by tonight or the next day.  Walk with the use of an assistive device or accompanied by an adult for the 24 hours.  You may use ice on  the affected area for the first 24 hours.  Put ice in a Ziploc bag and cover with a towel and place against area 15 minutes on 15 minutes off.  You may switch to heat after 24 hours.Radiofrequency Lesioning, Care After Refer to this sheet in the next few weeks. These instructions provide you with information about caring for yourself after your procedure. Your health care provider may also give you more specific instructions. Your treatment has been planned according to current medical practices, but problems sometimes occur. Call your health care provider if you have any problems or questions after your procedure. What can I expect after the procedure? After the procedure, it is common to have:  Pain from the burned nerve.  Temporary numbness. Follow these instructions at home:  Take over-the-counter and prescription medicines only as told by your health care provider.  Return to your normal activities as told by your health care provider. Ask your health care provider what activities are safe for you.  Pay close attention to how you feel after the procedure. If you start to have pain, write down when it hurts and how it feels. This will help you and your health care provider to know if you need an additional treatment.  Check your needle insertion site every day for signs of infection. Watch for:  Redness, swelling, or pain.  Fluid, blood, or pus.  Keep all follow-up visits as told by your health care provider. This is important. Contact a health care provider if:  Your pain does not get better.  You have redness, swelling, or pain at the needle insertion site.  You have fluid, blood, or pus coming from the needle insertion site.  You have a fever. Get help right away if:  You develop sudden, severe pain.  You develop numbness or tingling near the procedure site that does not go away. This information is not intended to replace advice given to you by your health care provider.  Make sure you discuss any questions you have with your health care provider. Document Released: 12/20/2010 Document Revised: 09/28/2015 Document Reviewed: 05/30/2014 Elsevier Interactive Patient Education  2017 [place]. Radiofrequency Lesioning Radiofrequency lesioning is a procedure that is performed to relieve pain. The procedure is often used for back, neck, or arm pain. Radiofrequency lesioning involves the use of a machine that creates radio waves to make heat. During the procedure, the heat is applied to the nerve that carries the pain signal. The heat damages the nerve and interferes with the pain signal. Pain relief usually starts about 2 weeks after the procedure and lasts for 6 months to 1 year. Tell a health care provider about:  Any allergies you have.  All medicines you are taking, including vitamins, herbs, eye drops, creams, and over-the-counter medicines.  Any problems you or family members have had with anesthetic medicines.  Any blood disorders you have.  Any surgeries you have had.  Any medical conditions you have.  Whether you are pregnant or may be pregnant. What are the risks? Generally, this is a safe procedure. However, problems may occur, including:  Pain or soreness at the injection site.  Infection at the injection site.  Damage to nerves or blood vessels. What happens before the procedure?  Ask your health care provider about:  Changing or stopping your regular medicines. This is especially important if you are taking diabetes medicines or blood thinners.  Taking medicines such as aspirin and ibuprofen. These medicines can thin your blood. Do not take these medicines before your procedure if your health care provider instructs you not to.  Follow instructions from your health care provider about eating or drinking restrictions.  Plan to have someone take you home after the procedure.  If you go home right after the procedure, plan to have  someone with you for 24 hours. What happens during the procedure?  You will be given one or more of the following:  A medicine to help you relax (sedative).  A medicine to numb the area (local anesthetic).  You will be awake during the procedure. You will need to be able to talk with the health care provider during the procedure.  With the help of a type of X-ray (fluoroscopy), the health care provider will insert a radiofrequency needle into the area to be treated.  Next, a wire that carries the radio waves (electrode) will be put through the radiofrequency needle. An electrical pulse will be sent through the electrode to verify the correct nerve. You will feel a tingling sensation, and you may have muscle twitching.  Then, the tissue that is around the needle tip will be heated by an electric current that is passed using the radiofrequency machine. This will numb the nerves.  A bandage (dressing) will be put on the insertion area after the procedure is done. The procedure may vary among health care providers and hospitals. What happens after the procedure?  Your blood pressure, heart rate, breathing rate, and blood oxygen level will be monitored often until the medicines you were given have worn off.  Return to your normal activities as directed by your health care provider. This information is not intended to replace advice given to you by your health care provider. Make sure you discuss any questions you have with your health care provider. Document Released: 12/19/2010 Document Revised: 09/28/2015 Document Reviewed: 05/30/2014 Elsevier Interactive Patient Education  2017 [place]  What are the risk, side effects and possible complications? Generally speaking, most procedures are safe.  However, with any procedure there are risks, side effects, and the possibility of complications.  The risks and complications are dependent upon the sites that  are lesioned, or the type of nerve block to be performed.  The closer the procedure is to the spine, the more serious the risks are.  Great care is taken when placing the radio frequency needles, block needles or lesioning probes, but sometimes complications can occur. 1. Infection: Any time there is an injection through the skin, there is a risk of infection.  This is why sterile conditions are used for these blocks.  There are four possible types of infection. 1. Localized skin infection. 2. Central  Nervous System Infection-This can be in the form of Meningitis, which can be deadly. 3. Epidural Infections-This can be in the form of an epidural abscess, which can cause pressure inside of the spine, causing compression of the spinal cord with subsequent paralysis. This would require an emergency surgery to decompress, and there are no guarantees that the patient would recover from the paralysis. 4. Discitis-This is an infection of the intervertebral discs.  It occurs in about 1% of discography procedures.  It is difficult to treat and it may lead to surgery.        2. Pain: the needles have to go through skin and soft tissues, will cause soreness.       3. Damage to internal structures:  The nerves to be lesioned may be near blood vessels or    other nerves which can be potentially damaged.       4. Bleeding: Bleeding is more common if the patient is taking blood thinners such as  aspirin, Coumadin, Ticiid, Plavix, etc., or if he/she have some genetic predisposition  such as hemophilia. Bleeding into the spinal canal can cause compression of the spinal  cord with subsequent paralysis.  This would require an emergency surgery to  decompress and there are no guarantees that the patient would recover from the  paralysis.       5. Pneumothorax:  Puncturing of a lung is a possibility, every time a needle is introduced in  the area of the chest or upper back.  Pneumothorax refers to free air around the    collapsed lung(s), inside of the thoracic cavity (chest cavity).  Another two possible  complications related to a similar event would include: Hemothorax and Chylothorax.   These are variations of the Pneumothorax, where instead of air around the collapsed  lung(s), you may have blood or chyle, respectively.       6. Spinal headaches: They may occur with any procedures in the area of the spine.       7. Persistent CSF (Cerebro-Spinal Fluid) leakage: This is a rare problem, but may occur  with prolonged intrathecal or epidural catheters either due to the formation of a fistulous  track or a dural tear.       8. Nerve damage: By working so close to the spinal cord, there is always a possibility of  nerve damage, which could be as serious as a permanent spinal cord injury with  paralysis.       9. Death:  Although rare, severe deadly allergic reactions known as "Anaphylactic  reaction" can occur to any of the medications used.      10. Worsening of the symptoms:  We can always make thing worse.  What are the chances of something like this happening? Chances of any of this occuring are extremely low.  By statistics, you have more of a chance of getting killed in a motor vehicle accident: while driving to the hospital than any of the above occurring .  Nevertheless, you should be aware that they are possibilities.  In general, it is similar to taking a shower.  Everybody knows that you can slip, hit your head and get killed.  Does that mean that you should not shower again?  Nevertheless always keep in mind that statistics do not mean anything if you happen to be on the wrong side of them.  Even if a procedure has a 1 (one) in a 1,000,000 (million) chance of going wrong, it you happen  to be that one..Also, keep in mind that by statistics, you have more of a chance of having something go wrong when taking medications.  Who should not have this procedure? If you are on a blood thinning medication (e.g.  Coumadin, Plavix, see list of "Blood Thinners"), or if you have an active infection going on, you should not have the procedure.  If you are taking any blood thinners, please inform your physician.  How should I prepare for this procedure?  Do not eat or drink anything at least six hours prior to the procedure.  Bring a driver with you .  It cannot be a taxi.  Come accompanied by an adult that can drive you back, and that is strong enough to help you if your legs get weak or numb from the local anesthetic.  Take all of your medicines the morning of the procedure with just enough water to swallow them.  If you have diabetes, make sure that you are scheduled to have your procedure done first thing in the morning, whenever possible.  If you have diabetes, take only half of your insulin dose and notify our nurse that you have done so as soon as you arrive at the clinic.  If you are diabetic, but only take blood sugar pills (oral hypoglycemic), then do not take them on the morning of your procedure.  You may take them after you have had the procedure.  Do not take aspirin or any aspirin-containing medications, at least eleven (11) days prior to the procedure.  They may prolong bleeding.  Wear loose fitting clothing that may be easy to take off and that you would not mind if it got stained with Betadine or blood.  Do not wear any jewelry or perfume  Remove any nail coloring.  It will interfere with some of our monitoring equipment.  NOTE: Remember that this is not meant to be interpreted as a complete list of all possible complications.  Unforeseen problems may occur.  BLOOD THINNERS The following drugs contain aspirin or other products, which can cause increased bleeding during surgery and should not be taken for 2 weeks prior to and 1 week after surgery.  If you should need take something for relief of minor pain, you may take acetaminophen which is found in Tylenol,m Datril, Anacin-3 and  Panadol. It is not blood thinner. The products listed below are.  Do not take any of the products listed below in addition to any listed on your instruction sheet.  A.P.C or A.P.C with Codeine Codeine Phosphate Capsules #3 Ibuprofen Ridaura  ABC compound Congesprin Imuran rimadil  Advil Cope Indocin Robaxisal  Alka-Seltzer Effervescent Pain Reliever and Antacid Coricidin or Coricidin-D  Indomethacin Rufen  Alka-Seltzer plus Cold Medicine Cosprin Ketoprofen S-A-C Tablets  Anacin Analgesic Tablets or Capsules Coumadin Korlgesic Salflex  Anacin Extra Strength Analgesic tablets or capsules CP-2 Tablets Lanoril Salicylate  Anaprox Cuprimine Capsules Levenox Salocol  Anexsia-D Dalteparin Magan Salsalate  Anodynos Darvon compound Magnesium Salicylate Sine-off  Ansaid Dasin Capsules Magsal Sodium Salicylate  Anturane Depen Capsules Marnal Soma  APF Arthritis pain formula Dewitt's Pills Measurin Stanback  Argesic Dia-Gesic Meclofenamic Sulfinpyrazone  Arthritis Bayer Timed Release Aspirin Diclofenac Meclomen Sulindac  Arthritis pain formula Anacin Dicumarol Medipren Supac  Analgesic (Safety coated) Arthralgen Diffunasal Mefanamic Suprofen  Arthritis Strength Bufferin Dihydrocodeine Mepro Compound Suprol  Arthropan liquid Dopirydamole Methcarbomol with Aspirin Synalgos  ASA tablets/Enseals Disalcid Micrainin Tagament  Ascriptin Doan's Midol Talwin  Ascriptin A/D Dolene Mobidin Tanderil  Ascriptin Extra Strength Dolobid Moblgesic Ticlid  Ascriptin with Codeine Doloprin or Doloprin with Codeine Momentum Tolectin  Asperbuf Duoprin Mono-gesic Trendar  Aspergum Duradyne Motrin or Motrin IB Triminicin  Aspirin plain, buffered or enteric coated Durasal Myochrisine Trigesic  Aspirin Suppositories Easprin Nalfon Trillsate  Aspirin with Codeine Ecotrin Regular or Extra Strength Naprosyn Uracel  Atromid-S Efficin Naproxen Ursinus  Auranofin Capsules Elmiron Neocylate Vanquish  Axotal Emagrin Norgesic  Verin  Azathioprine Empirin or Empirin with Codeine Normiflo Vitamin E  Azolid Emprazil Nuprin Voltaren  Bayer Aspirin plain, buffered or children's or timed BC Tablets or powders Encaprin Orgaran Warfarin Sodium  Buff-a-Comp Enoxaparin Orudis Zorpin  Buff-a-Comp with Codeine Equegesic Os-Cal-Gesic   Buffaprin Excedrin plain, buffered or Extra Strength Oxalid   Bufferin Arthritis Strength Feldene Oxphenbutazone   Bufferin plain or Extra Strength Feldene Capsules Oxycodone with Aspirin   Bufferin with Codeine Fenoprofen Fenoprofen Pabalate or Pabalate-SF   Buffets II Flogesic Panagesic   Buffinol plain or Extra Strength Florinal or Florinal with Codeine Panwarfarin   Buf-Tabs Flurbiprofen Penicillamine   Butalbital Compound Four-way cold tablets Penicillin   Butazolidin Fragmin Pepto-Bismol   Carbenicillin Geminisyn Percodan   Carna Arthritis Reliever Geopen Persantine   Carprofen Gold's salt Persistin   Chloramphenicol Goody's Phenylbutazone   Chloromycetin Haltrain Piroxlcam   Clmetidine heparin Plaquenil   Cllnoril Hyco-pap Ponstel   Clofibrate Hydroxy chloroquine Propoxyphen         Before stopping any of these medications, be sure to consult the physician who ordered them.  Some, such as Coumadin (Warfarin) are ordered to prevent or treat serious conditions such as "deep thrombosis", "pumonary embolisms", and other heart problems.  The amount of time that you may need off of the medication may also vary with the medication and the reason for which you were taking it.  If you are taking any of these medications, please make sure you notify your pain physician before you undergo any procedures.   Medication Rules  Applies to: All patients receiving prescriptions (written or electronic).  Pharmacy of record: Pharmacy where electronic prescriptions will be sent. If written prescriptions are taken to a different pharmacy, please inform the nursing staff. The pharmacy listed in the  electronic medical record should be the one where you would like electronic prescriptions to be sent.  Prescription refills: Only during scheduled appointments. Applies to both, written and electronic prescriptions.  NOTE: The following applies primarily to controlled substances (Opioid Pain Medications)  Patient's responsibilities: 1. Pain Pills: Bring all pain pills to every appointment (except for procedure appointments). 2. Pill Bottles: Bring pills in original pharmacy bottle. Always bring newest bottle. Bring bottle, even if empty. 3. Medication refills: You are responsible for knowing and keeping track of what medications you need refilled. The day before your appointment, write a list of all prescriptions that need to be refilled. Bring that list to your appointment and give it to the admitting nurse. Prescriptions will be written only during appointments. If you forget a medication, it will not be "Called in", "Faxed", or "electronically sent". You will need to get another appointment to get these prescribed. 4. Prescription Accuracy: You are responsible for carefully inspecting your prescriptions before leaving our office. Have the discharge nurse carefully go over each prescription with you, before taking them home. Make sure that your name is accurately spelled, that your address is correct. Check the name and dose of your medication to make sure it is accurate. Check the number of pills, and the written  instructions to make sure they are clear and accurate. Make sure that you are given enough medication to last until your next medication refill appointment. 5. Taking Medication: Take medication as prescribed. Never take more pills than instructed. Never take medication more frequently than prescribed. Taking less pills or less frequently is permitted and encouraged, when it comes to controlled substances (written prescriptions).  6. Inform other Doctors: Always inform, all of your healthcare  providers, of all the medications you take. 7. Pain Medication from other Providers: You are not allowed to accept any additional pain medication from any other Doctor or Healthcare provider. There are two exceptions to this rule. (see below) In the event that you require additional pain medication, you are responsible for notifying us, as stated below. 8. Medication Agreement: You are responsible for carefully reading and following our Medication Agreement. This must be signed before receiving any prescriptions from our practice. Safely store a copy of your signed Agreement. Violations to the Agreement will result in no further prescriptions. (Additional copies of our Medication Agreement are available upon request.) 9. Laws, Rules, & Regulations: All patients are expected to follow all Federal and [employer], [employer], Rules, [employer]. Ignorance of the Laws does not constitute a valid excuse.  Exceptions: There are only two exceptions to the rule of not receiving pain medications from other Healthcare Providers. 1. Exception #1 (Emergencies): In the event of an emergency (i.e.: accident requiring emergency care), you are allowed to receive additional pain medication. However, you are responsible for: As soon as you are able, call our office [phone], at any time of the day or night, and leave a message stating your name, the date and nature of the emergency, and the name and dose of the medication prescribed. In the event that your call is answered by a member of our staff, make sure to document and save the date, time, and the name of the person that took your information.  2. Exception #2 (Planned Surgery): In the event that you are scheduled by another doctor or dentist to have any type of surgery or procedure, you are allowed (for a period no longer than 30 days), to receive additional pain medication, for the acute post-op pain. However, in this case, you are responsible for picking up a  copy of our "Post-op Pain Management for Surgeons" handout, and giving it to your surgeon or dentist. This document is available at our office, and does not require an appointment to obtain it. Simply go to our office during business hours (Monday-Thursday from 8:00 AM to 4:00 PM) (Friday 8:00 AM to 12:00 Noon) or if you have a scheduled appointment with [place], prior to your surgery, and ask for it by name. In addition, you will need to provide [place] with your name, name of your surgeon, type of surgery, and date of procedure or surgery.  _____________________________________________________________________________________________

## 2016-09-10 ENCOUNTER — Telehealth: Payer: Self-pay | Admitting: *Deleted

## 2016-09-10 DIAGNOSIS — I493 Ventricular premature depolarization: Secondary | ICD-10-CM | POA: Diagnosis not present

## 2016-09-10 NOTE — Telephone Encounter (Signed)
Denies any needs at this time. Instructed to call if needed. 

## 2016-09-10 NOTE — Telephone Encounter (Signed)
Called pt- She is very confused about all the appointments she has and when to come back states she Needs her Norflex and Trazodone filled and runs out soon. She didn't get these when she was here last. Will look into this and will call her back

## 2016-09-10 NOTE — Telephone Encounter (Signed)
Attempted to call back. No answer. Pt does have refills on her medications that she was questioning. Trazadone and Norflex to be fill 09/28/16 last until 12/27/16.

## 2016-09-11 ENCOUNTER — Telehealth: Payer: Self-pay

## 2016-09-11 NOTE — Telephone Encounter (Signed)
Patient wants to increase her pain medication from 2 tabs to 2 1/2. Please call her and let her know if that is ok.
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
## 2016-09-18 ENCOUNTER — Other Ambulatory Visit: Payer: Self-pay | Admitting: Pulmonary Disease

## 2016-09-19 ENCOUNTER — Encounter: Payer: Medicare Other | Admitting: Pain Medicine

## 2016-09-19 DIAGNOSIS — E7800 Pure hypercholesterolemia, unspecified: Secondary | ICD-10-CM | POA: Diagnosis not present

## 2016-09-19 DIAGNOSIS — M545 Low back pain: Secondary | ICD-10-CM | POA: Diagnosis not present

## 2016-09-19 DIAGNOSIS — Z1159 Encounter for screening for other viral diseases: Secondary | ICD-10-CM | POA: Diagnosis not present

## 2016-09-19 DIAGNOSIS — G4700 Insomnia, unspecified: Secondary | ICD-10-CM | POA: Diagnosis not present

## 2016-09-19 DIAGNOSIS — J449 Chronic obstructive pulmonary disease, unspecified: Secondary | ICD-10-CM | POA: Diagnosis not present

## 2016-09-19 DIAGNOSIS — Z8249 Family history of ischemic heart disease and other diseases of the circulatory system: Secondary | ICD-10-CM | POA: Diagnosis not present

## 2016-09-19 DIAGNOSIS — R5383 Other fatigue: Secondary | ICD-10-CM | POA: Diagnosis not present

## 2016-09-19 DIAGNOSIS — E559 Vitamin D deficiency, unspecified: Secondary | ICD-10-CM | POA: Diagnosis not present

## 2016-09-19 DIAGNOSIS — F17210 Nicotine dependence, cigarettes, uncomplicated: Secondary | ICD-10-CM | POA: Diagnosis not present

## 2016-09-20 DIAGNOSIS — R0602 Shortness of breath: Secondary | ICD-10-CM | POA: Diagnosis not present

## 2016-09-24 ENCOUNTER — Telehealth: Payer: Self-pay | Admitting: Pulmonary Disease

## 2016-09-24 NOTE — Telephone Encounter (Signed)
Patient was contacted to schedule follow up appointment with Dr. Ashok Cordia. Voicemail was left for patient to contact office.

## 2016-09-24 NOTE — Telephone Encounter (Signed)
A PA was initiated and approved for Spiriva Respimat until Sep 24, 2017 through [employer] [id number]); case number [id number]. [facility] made aware of the approval. Nothing further is needed.

## 2016-09-25 DIAGNOSIS — Z87891 Personal history of nicotine dependence: Secondary | ICD-10-CM | POA: Diagnosis not present

## 2016-09-25 DIAGNOSIS — I10 Essential (primary) hypertension: Secondary | ICD-10-CM | POA: Diagnosis not present

## 2016-09-25 DIAGNOSIS — E7800 Pure hypercholesterolemia, unspecified: Secondary | ICD-10-CM | POA: Diagnosis not present

## 2016-09-25 DIAGNOSIS — R0602 Shortness of breath: Secondary | ICD-10-CM | POA: Diagnosis not present

## 2016-10-09 ENCOUNTER — Ambulatory Visit: Payer: Medicare Other

## 2016-10-09 ENCOUNTER — Ambulatory Visit: Payer: Medicare Other | Admitting: Pain Medicine

## 2016-10-10 ENCOUNTER — Encounter: Payer: Self-pay | Admitting: Pulmonary Disease

## 2016-10-10 ENCOUNTER — Ambulatory Visit (INDEPENDENT_AMBULATORY_CARE_PROVIDER_SITE_OTHER): Payer: Medicare Other | Admitting: Pulmonary Disease

## 2016-10-10 VITALS — BP 120/70 | HR 72 | Ht 64.0 in | Wt 111.0 lb

## 2016-10-10 DIAGNOSIS — J84115 Respiratory bronchiolitis interstitial lung disease: Secondary | ICD-10-CM | POA: Diagnosis not present

## 2016-10-10 DIAGNOSIS — F17210 Nicotine dependence, cigarettes, uncomplicated: Secondary | ICD-10-CM

## 2016-10-10 DIAGNOSIS — F17200 Nicotine dependence, unspecified, uncomplicated: Secondary | ICD-10-CM

## 2016-10-10 LAB — PULMONARY FUNCTION TEST
FEF 25-75 POST: 2.14 L/s
FEF 25-75 Pre: 1.5 L/sec
FEF2575-%Change-Post: 42 %
FEF2575-%Pred-Post: 88 %
FEF2575-%Pred-Pre: 61 %
FEV1-%Change-Post: 8 %
FEV1-%PRED-PRE: 73 %
FEV1-%Pred-Post: 79 %
FEV1-POST: 2.08 L
FEV1-Pre: 1.91 L
FEV1FVC-%Change-Post: 7 %
FEV1FVC-%Pred-Pre: 96 %
FEV6-%CHANGE-POST: 1 %
FEV6-%PRED-POST: 79 %
FEV6-%PRED-PRE: 78 %
FEV6-POST: 2.57 L
FEV6-Pre: 2.53 L
FEV6FVC-%PRED-POST: 103 %
FEV6FVC-%Pred-Pre: 103 %
FVC-%CHANGE-POST: 1 %
FVC-%PRED-POST: 76 %
FVC-%PRED-PRE: 75 %
FVC-PRE: 2.53 L
FVC-Post: 2.57 L
POST FEV6/FVC RATIO: 100 %
PRE FEV1/FVC RATIO: 75 %
Post FEV1/FVC ratio: 81 %
Pre FEV6/FVC Ratio: 100 %

## 2016-10-10 MED ORDER — GUAIFENESIN-CODEINE 100-10 MG/5ML PO SYRP: 5.0000 mL | ORAL_SOLUTION | Freq: Four times a day (QID) | ORAL | 0 refills | Status: DC | PRN

## 2016-10-10 NOTE — Patient Instructions (Signed)
   Use your Spiriva Respimat by doing 2 twists/inhalations once daily.  Call me if you feel your breathing is getting worse or you have any questions.

## 2016-10-10 NOTE — Progress Notes (Signed)
PFT completed today. 10/10/16

## 2016-10-10 NOTE — Addendum Note (Signed)
Addended by: Tyson Dense on: 10/10/2016 11:41 AM   Modules accepted: Orders
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
## 2016-10-10 NOTE — Addendum Note (Signed)
Addended by: Tyson Dense on: 10/10/2016 11:40 AM   Modules accepted: Orders

## 2016-10-14 ENCOUNTER — Telehealth: Payer: Self-pay | Admitting: Pulmonary Disease

## 2016-10-14 DIAGNOSIS — R053 Chronic cough: Secondary | ICD-10-CM

## 2016-10-14 DIAGNOSIS — R05 Cough: Secondary | ICD-10-CM

## 2016-10-14 NOTE — Telephone Encounter (Signed)
Ok to order an X ray. She needs to quit smoking if she is still on cigarettes. Follow up with Dr. Ashok Cordia after CXR for review and further treatment.   Marshell Garfinkel MD

## 2016-10-14 NOTE — Addendum Note (Signed)
Addended by: Len Blalock on: 10/14/2016 05:08 PM   Modules accepted: Orders

## 2016-10-14 NOTE — Telephone Encounter (Signed)
Spoke with pt. Advised her that we are still waiting to here back from [facility]. She verbalized understanding.  JN - please advise. Thanks.

## 2016-10-14 NOTE — Telephone Encounter (Signed)
Spoke with pt. States that she is not feeling well. Reports increased SOB and coughing. Denies chest tightness or wheezing. States, "I feel like my lungs are inflating like they should." She tried to go fishing yesterday and couldn't due to her breathing. States, "At my last appointment, Dr. Ashok Cordia told me to stay inside out of this heat. I can't just sit in the house all day." Pt feels like she needs a CXR.  JN - please advise. Thanks.

## 2016-10-14 NOTE — Telephone Encounter (Signed)
Pt also states that she is coughing so bad that it makes her head hurt.Hillery Hunter

## 2016-10-14 NOTE — Telephone Encounter (Signed)
Will forward to DOD

## 2016-10-14 NOTE — Telephone Encounter (Signed)
Pt aware of recs.  cxr ordered.  Nothing further needed at this time.

## 2016-10-15 ENCOUNTER — Ambulatory Visit (INDEPENDENT_AMBULATORY_CARE_PROVIDER_SITE_OTHER)
Admission: RE | Admit: 2016-10-15 | Discharge: 2016-10-15 | Disposition: A | Discharge status: Home / Self Care | Payer: Medicare Other | Source: Ambulatory Visit | Attending: Pulmonary Disease | Admitting: Pulmonary Disease

## 2016-10-15 DIAGNOSIS — R05 Cough: Secondary | ICD-10-CM | POA: Diagnosis not present

## 2016-10-15 DIAGNOSIS — R053 Chronic cough: Secondary | ICD-10-CM

## 2016-10-15 NOTE — Telephone Encounter (Signed)
Spoke with pt. She is aware of CXR results and JN's message. Pt does not want to change her inhaler at this time. Nothing further was needed at this time.

## 2016-10-15 NOTE — Progress Notes (Signed)
Spoke with patient and informed her of results and recommendations. Pt verbalized understanding and did not have any questions. Nothing further is needed.

## 2016-10-15 NOTE — Telephone Encounter (Addendum)
Please advise the patient and I reviewed her chest x-ray. There is no focal opacity. In the absence of any fever, chills, sweats, or mucus production I do not feel that antibiotics are warranted. Please ensure she is going to inhalations off her Spiriva Respimat as instructed at last appointment. We can try her on Trelegy if she would like. Please provider her with a sampele and instructions if she would like to try this in place of her Spiriva Respimat. Unfortunately, until she quit smoking I doubt that her cough will improve. As she herself admitted, I instructed her to avoid excessive heat and humidity which can make her breathing and symptoms worse. Thank you.

## 2016-10-16 ENCOUNTER — Ambulatory Visit: Payer: Medicare Other | Admitting: Pain Medicine

## 2016-10-23 ENCOUNTER — Encounter: Payer: Self-pay | Admitting: Pain Medicine

## 2016-10-23 ENCOUNTER — Ambulatory Visit (HOSPITAL_BASED_OUTPATIENT_CLINIC_OR_DEPARTMENT_OTHER): Payer: Medicare Other | Admitting: Pain Medicine

## 2016-10-23 ENCOUNTER — Ambulatory Visit
Admission: RE | Admit: 2016-10-23 | Discharge: 2016-10-23 | Disposition: A | Discharge status: Home / Self Care | Payer: Medicare Other | Source: Ambulatory Visit | Attending: Pain Medicine | Admitting: Pain Medicine

## 2016-10-23 VITALS — BP 120/69 | HR 39 | Temp 97.9°F | Resp 16 | Ht 64.0 in | Wt 109.0 lb

## 2016-10-23 DIAGNOSIS — M47816 Spondylosis without myelopathy or radiculopathy, lumbar region: Secondary | ICD-10-CM | POA: Insufficient documentation

## 2016-10-23 DIAGNOSIS — M549 Dorsalgia, unspecified: Secondary | ICD-10-CM | POA: Insufficient documentation

## 2016-10-23 DIAGNOSIS — M4696 Unspecified inflammatory spondylopathy, lumbar region: Secondary | ICD-10-CM

## 2016-10-23 DIAGNOSIS — G8918 Other acute postprocedural pain: Secondary | ICD-10-CM

## 2016-10-23 DIAGNOSIS — M545 Low back pain, unspecified: Secondary | ICD-10-CM

## 2016-10-23 DIAGNOSIS — G8929 Other chronic pain: Secondary | ICD-10-CM | POA: Insufficient documentation

## 2016-10-23 HISTORY — DX: Other acute postprocedural pain: G89.18

## 2016-10-23 MED ORDER — OXYCODONE-ACETAMINOPHEN 5-325 MG PO TABS: 1.0000 | ORAL_TABLET | Freq: Three times a day (TID) | ORAL | 0 refills | Status: DC | PRN

## 2016-10-23 MED ORDER — TRIAMCINOLONE ACETONIDE 40 MG/ML IJ SUSP
INTRAMUSCULAR | Status: AC
  Filled 2016-10-23: qty 1

## 2016-10-23 MED ORDER — LACTATED RINGERS IV SOLN
1000.0000 mL | Freq: Once | INTRAVENOUS | Status: AC
  Administered 2016-10-23: 1000 mL via INTRAVENOUS

## 2016-10-23 MED ORDER — FENTANYL CITRATE (PF) 100 MCG/2ML IJ SOLN
25.0000 ug | INTRAMUSCULAR | Status: DC | PRN
Start: 2016-10-23 — End: 2016-10-23
  Administered 2016-10-23: 100 ug via INTRAVENOUS

## 2016-10-23 MED ORDER — LIDOCAINE HCL (PF) 1.5 % IJ SOLN
20.0000 mL | Freq: Once | INTRAMUSCULAR | Status: AC
  Administered 2016-10-23: 20 mL
  Filled 2016-10-23: qty 20

## 2016-10-23 MED ORDER — FENTANYL CITRATE (PF) 100 MCG/2ML IJ SOLN
INTRAMUSCULAR | Status: AC
  Filled 2016-10-23: qty 2

## 2016-10-23 MED ORDER — MIDAZOLAM HCL 5 MG/5ML IJ SOLN
INTRAMUSCULAR | Status: AC
  Filled 2016-10-23: qty 5

## 2016-10-23 MED ORDER — ROPIVACAINE HCL 2 MG/ML IJ SOLN
9.0000 mL | Freq: Once | INTRAMUSCULAR | Status: AC
  Administered 2016-10-23: 9 mL via PERINEURAL

## 2016-10-23 MED ORDER — MIDAZOLAM HCL 5 MG/5ML IJ SOLN
1.0000 mg | INTRAMUSCULAR | Status: DC | PRN
  Administered 2016-10-23: 3 mg via INTRAVENOUS

## 2016-10-23 MED ORDER — ROPIVACAINE HCL 2 MG/ML IJ SOLN
INTRAMUSCULAR | Status: AC
Start: 2016-10-23 — End: ?
  Filled 2016-10-23: qty 10

## 2016-10-23 MED ORDER — TRIAMCINOLONE ACETONIDE 40 MG/ML IJ SUSP
40.0000 mg | Freq: Once | INTRAMUSCULAR | Status: AC
  Administered 2016-10-23: 40 mg

## 2016-10-23 NOTE — Progress Notes (Signed)
Safety precautions to be maintained throughout the outpatient stay will include: orient to surroundings, keep bed in low position, maintain call bell within reach at all times, provide assistance with transfer out of bed and ambulation.  

## 2016-10-23 NOTE — Patient Instructions (Addendum)
____________________________________________________________________________________________  Post-Procedure instructions Instructions:  Apply ice: Fill a plastic sandwich bag with crushed ice. Cover it with a small towel and apply to injection site. Apply for 15 minutes then remove x 15 minutes. Repeat sequence on day of procedure, until you go to bed. The purpose is to minimize swelling and discomfort after procedure.  Apply heat: Apply heat to procedure site starting the day following the procedure. The purpose is to treat any soreness and discomfort from the procedure.  Food intake: Start with clear liquids (like water) and advance to regular food, as tolerated.   Physical activities: Keep activities to a minimum for the first 8 hours after the procedure.   Driving: If you have received any sedation, you are not allowed to drive for 24 hours after your procedure.  Blood thinner: Restart your blood thinner 6 hours after your procedure. (Only for those taking blood thinners)  Insulin: As soon as you can eat, you may resume your normal dosing schedule. (Only for those taking insulin)  Infection prevention: Keep procedure site clean and dry.  Post-procedure Pain Diary: Extremely important that this be done correctly and accurately. Recorded information will be used to determine the next step in treatment.  Pain evaluated is that of treated area only. Do not include pain from an untreated area.  Complete every hour, on the hour, for the initial 8 hours. Set an alarm to help you do this part accurately.  Do not go to sleep and have it completed later. It will not be accurate.  Follow-up appointment: Keep your follow-up appointment after the procedure. Usually 2 weeks for most procedures. (6 weeks in the case of radiofrequency.) Bring you pain diary.  Expect:  From numbing medicine (AKA: Local Anesthetics): Numbness or decrease in pain.  Onset: Full effect within 15 minutes of  injected.  Duration: It will depend on the type of local anesthetic used. On the average, 1 to 8 hours.   From steroids: Decrease in swelling or inflammation. Once inflammation is improved, relief of the pain will follow.  Onset of benefits: Depends on the amount of swelling present. The more swelling, the longer it will take for the benefits to be seen. In some cases, up to 10 days.  Duration: Steroids will stay in the system x 2 weeks. Duration of benefits will depend on multiple posibilities including persistent irritating factors.  From procedure: Some discomfort is to be expected once the numbing medicine wears off. This should be minimal if ice and heat are applied as instructed. Call if:  You experience numbness and weakness that gets worse with time, as opposed to wearing off.  New onset bowel or bladder incontinence. (Spinal procedures only)  Emergency Numbers:  Durning business hours (Monday - Thursday, 8:00 AM - 4:00 PM) (Friday, 9:00 AM - 12:00 Noon): [phone]  After hours: [phone] ____________________________________________________________________________________________   Steps to Quit Smoking Smoking tobacco can be harmful to your health and can affect almost every organ in your body. Smoking puts you, and those around you, at risk for developing many serious chronic diseases. Quitting smoking is difficult, but it is one of the best things that you can do for your health. It is never too late to quit. What are the benefits of quitting smoking? When you quit smoking, you lower your risk of developing serious diseases and conditions, such as:  Lung cancer or lung disease, such as COPD.  Heart disease.  Stroke.  Heart attack.  Infertility.  Osteoporosis  and bone fractures.  Additionally, symptoms such as coughing, wheezing, and shortness of breath may get better when you quit. You may also find that you get sick less often because your body is  stronger at fighting off colds and infections. If you are pregnant, quitting smoking can help to reduce your chances of having a baby of low birth weight. How do I get ready to quit? When you decide to quit smoking, create a plan to make sure that you are successful. Before you quit:  Pick a date to quit. Set a date within the next two weeks to give you time to prepare.  Write down the reasons why you are quitting. Keep this list in places where you will see it often, such as on your bathroom mirror or in your car or wallet.  Identify the people, places, things, and activities that make you want to smoke (triggers) and avoid them. Make sure to take these actions: ? Throw away all cigarettes at home, at work, and in your car. ? Throw away smoking accessories, such as Scientist, research (medical). ? Clean your car and make sure to empty the ashtray. ? Clean your home, including curtains and carpets.  Tell your family, friends, and coworkers that you are quitting. Support from your loved ones can make quitting easier.  Talk with your health care provider about your options for quitting smoking.  Find out what treatment options are covered by your health insurance.  What strategies can I use to quit smoking? Talk with your healthcare provider about different strategies to quit smoking. Some strategies include:  Quitting smoking altogether instead of gradually lessening how much you smoke over a period of time. Research shows that quitting "cold [place]" is more successful than gradually quitting.  Attending in-person counseling to help you build problem-solving skills. You are more likely to have success in quitting if you attend several counseling sessions. Even short sessions of 10 minutes can be effective.  Finding resources and support systems that can help you to quit smoking and remain smoke-free after you quit. These resources are most helpful when you use them often. They can  include: ? Online chats with a Social worker. ? Telephone quitlines. ? Careers information officer. ? Support groups or group counseling. ? Text messaging programs. ? Mobile phone applications.  Taking medicines to help you quit smoking. (If you are pregnant or breastfeeding, talk with your health care provider first.) Some medicines contain nicotine and some do not. Both types of medicines help with cravings, but the medicines that include nicotine help to relieve withdrawal symptoms. Your health care provider may recommend: ? Nicotine patches, gum, or lozenges. ? Nicotine inhalers or sprays. ? Non-nicotine medicine that is taken by mouth.  Talk with your health care provider about combining strategies, such as taking medicines while you are also receiving in-person counseling. Using these two strategies together makes you more likely to succeed in quitting than if you used either strategy on its own. If you are pregnant or breastfeeding, talk with your health care provider about finding counseling or other support strategies to quit smoking. Do not take medicine to help you quit smoking unless told to do so by your health care provider. What things can I do to make it easier to quit? Quitting smoking might feel overwhelming at first, but there is a lot that you can do to make it easier. Take these important actions:  Reach out to your family and friends and ask that they  support and encourage you during this time. Call telephone quitlines, reach out to support groups, or work with a counselor for support.  Ask people who smoke to avoid smoking around you.  Avoid places that trigger you to smoke, such as bars, parties, or smoke-break areas at work.  Spend time around people who do not smoke.  Lessen stress in your life, because stress can be a smoking trigger for some people. To lessen stress, try: ? Exercising regularly. ? Deep-breathing exercises. ? Yoga. ? Meditating. ? Performing a  body scan. This involves closing your eyes, scanning your body from head to toe, and noticing which parts of your body are particularly tense. Purposefully relax the muscles in those areas.  Download or purchase mobile phone or tablet apps (applications) that can help you stick to your quit plan by providing reminders, tips, and encouragement. There are many free apps, such as QuitGuide from the [employer] Office manager for Disease Control and Prevention). You can find other support for quitting smoking (smoking cessation) through smokefree.gov and other websites.  How will I feel when I quit smoking? Within the first 24 hours of quitting smoking, you may start to feel some withdrawal symptoms. These symptoms are usually most noticeable 2-3 days after quitting, but they usually do not last beyond 2-3 weeks. Changes or symptoms that you might experience include:  Mood swings.  Restlessness, anxiety, or irritation.  Difficulty concentrating.  Dizziness.  Strong cravings for sugary foods in addition to nicotine.  Mild weight gain.  Constipation.  Nausea.  Coughing or a sore throat.  Changes in how your medicines work in your body.  A depressed mood.  Difficulty sleeping (insomnia).  After the first 2-3 weeks of quitting, you may start to notice more positive results, such as:  Improved sense of smell and taste.  Decreased coughing and sore throat.  Slower heart rate.  Lower blood pressure.  Clearer skin.  The ability to breathe more easily.  Fewer sick days.  Quitting smoking is very challenging for most people. Do not get discouraged if you are not successful the first time. Some people need to make many attempts to quit before they achieve long-term success. Do your best to stick to your quit plan, and talk with your health care provider if you have any questions or concerns. This information is not intended to replace advice given to you by your health care provider. Make sure you  discuss any questions you have with your health care provider. Document Released: 04/16/2001 Document Revised: 12/19/2015 Document Reviewed: 09/06/2014 Elsevier Interactive Patient Education  2017 [employer].  Steps to Quit Smoking Smoking tobacco can be bad for your health. It can also affect almost every organ in your body. Smoking puts you and people around you at risk for many serious long-lasting (chronic) diseases. Quitting smoking is hard, but it is one of the best things that you can do for your health. It is never too late to quit. What are the benefits of quitting smoking? When you quit smoking, you lower your risk for getting serious diseases and conditions. They can include:  Lung cancer or lung disease.  Heart disease.  Stroke.  Heart attack.  Not being able to have children (infertility).  Weak bones (osteoporosis) and broken bones (fractures).  If you have coughing, wheezing, and shortness of breath, those symptoms may get better when you quit. You may also get sick less often. If you are pregnant, quitting smoking can help to lower your chances  of having a baby of low birth weight. What can I do to help me quit smoking? Talk with your doctor about what can help you quit smoking. Some things you can do (strategies) include:  Quitting smoking totally, instead of slowly cutting back how much you smoke over a period of time.  Going to in-person counseling. You are more likely to quit if you go to many counseling sessions.  Using resources and support systems, such as: ? Database administrator with a Social worker. ? Phone quitlines. ? Careers information officer. ? Support groups or group counseling. ? Text messaging programs. ? Mobile phone apps or applications.  Taking medicines. Some of these medicines may have nicotine in them. If you are pregnant or breastfeeding, do not take any medicines to quit smoking unless your doctor says it is okay. Talk with your doctor about  counseling or other things that can help you.  Talk with your doctor about using more than one strategy at the same time, such as taking medicines while you are also going to in-person counseling. This can help make quitting easier. What things can I do to make it easier to quit? Quitting smoking might feel very hard at first, but there is a lot that you can do to make it easier. Take these steps:  Talk to your family and friends. Ask them to support and encourage you.  Call phone quitlines, reach out to support groups, or work with a Social worker.  Ask people who smoke to not smoke around you.  Avoid places that make you want (trigger) to smoke, such as: ? Bars. ? Parties. ? Smoke-break areas at work.  Spend time with people who do not smoke.  Lower the stress in your life. Stress can make you want to smoke. Try these things to help your stress: ? Getting regular exercise. ? Deep-breathing exercises. ? Yoga. ? Meditating. ? Doing a body scan. To do this, close your eyes, focus on one area of your body at a time from head to toe, and notice which parts of your body are tense. Try to relax the muscles in those areas.  Download or buy apps on your mobile phone or tablet that can help you stick to your quit plan. There are many free apps, such as QuitGuide from the [employer] Office manager for Disease Control and Prevention). You can find more support from smokefree.gov and other websites.  This information is not intended to replace advice given to you by your health care provider. Make sure you discuss any questions you have with your health care provider. Document Released: 02/16/2009 Document Revised: 12/19/2015 Document Reviewed: 09/06/2014 Elsevier Interactive Patient Education  2018 [employer].  Smoking Tobacco Information Smoking tobacco will very likely harm your health. Tobacco contains a poisonous (toxic), colorless chemical called nicotine. Nicotine affects the brain and makes tobacco  addictive. This change in your brain can make it hard to stop smoking. Tobacco also has other toxic chemicals that can hurt your body and raise your risk of many cancers. How can smoking tobacco affect me? Smoking tobacco can increase your chances of having serious health conditions, such as:  Cancer. Smoking is most commonly associated with lung cancer, but can lead to cancer in other parts of the body.  Chronic obstructive pulmonary disease (COPD). This is a long-term lung condition that makes it hard to breathe. It also gets worse over time.  High blood pressure (hypertension), heart disease, stroke, or heart attack.  Lung infections, such as pneumonia.  Cataracts. This is when the lenses in the eyes become clouded.  Digestive problems. This may include peptic ulcers, heartburn, and gastroesophageal reflux disease (GERD).  Oral health problems, such as gum disease and tooth loss.  Loss of taste and smell.  Smoking can affect your appearance by causing:  Wrinkles.  Yellow or stained teeth, fingers, and fingernails.  Smoking tobacco can also affect your social life.  Many workplaces, [employer], hotels, and public places are tobacco-free. This means that you may experience challenges in finding places to smoke when away from home.  The cost of a smoking habit can be expensive. Expenses for someone who smokes come in two ways: ? You spend money on a regular basis to buy tobacco. ? Your health care costs in the long-term are higher if you smoke.  Tobacco smoke can also affect the health of those around you. Children of smokers have greater chances of: ? Sudden infant death syndrome (SIDS). ? Ear infections. ? Lung infections.  What lifestyle changes can be made?  Do not start smoking. Quit if you already do.  To quit smoking: ? Make a plan to quit smoking and commit yourself to it. Look for programs to help you and ask your health care provider for recommendations and  ideas. ? Talk with your health care provider about using nicotine replacement medicines to help you quit. Medicine replacement medicines include gum, lozenges, patches, sprays, or pills. ? Do not replace cigarette smoking with electronic cigarettes, which are commonly called e-cigarettes. The safety of e-cigarettes is not known, and some may contain harmful chemicals. ? Avoid places, people, or situations that tempt you to smoke. ? If you try to quit but return to smoking, don't give up hope. It is very common for people to try a number of times before they fully succeed. When you feel ready again, give it another try.  Quitting smoking might affect the way you eat as well as your weight. Be prepared to monitor your eating habits. Get support in planning and following a healthy diet.  Ask your health care provider about having regular tests (screenings) to check for cancer. This may include blood tests, imaging tests, and other tests.  Exercise regularly. Consider taking walks, joining a gym, or doing yoga or exercise classes.  Develop skills to manage your stress. These skills include meditation. What are the benefits of quitting smoking? By quitting smoking, you may:  Lower your risk of getting cancer and other diseases caused by smoking.  Live longer.  Breathe better.  Lower your blood pressure and heart rate.  Stop your addiction to tobacco.  Stop creating secondhand smoke that hurts other people.  Improve your sense of taste and smell.  Look better over time, due to having fewer wrinkles and less staining.  What can happen if changes are not made? If you do not stop smoking, you may:  Get cancer and other diseases.  Develop COPD or other long-term (chronic) lung conditions.  Develop serious problems with your heart and blood vessels (cardiovascular system).  Need more tests to screen for problems caused by smoking.  Have higher, long-term healthcare costs from  medicines or treatments related to smoking.  Continue to have worsening changes in your lungs, mouth, and nose.  Where to find support: To get support to quit smoking, consider:  Asking your health care provider for more information and resources.  Taking classes to learn more about quitting smoking.  Looking for local organizations that offer resources about  quitting smoking.  Joining a support group for people who want to quit smoking in your local community.  Where to find more information: You may find more information about quitting smoking from:  HelpGuide.org: www.helpguide.org/articles/addictions/how-to-quit-smoking.htm  https://hall.com/: smokefree.gov  American Lung Association: www.lung.org  Contact a health care provider if:  You have problems breathing.  Your lips, nose, or fingers turn blue.  You have chest pain.  You are coughing up blood.  You feel faint or you pass out.  You have other noticeable changes that cause you to worry. Summary  Smoking tobacco can negatively affect your health, the health of those around you, your finances, and your social life.  Do not start smoking. Quit if you already do. If you need help quitting, ask your health care provider.  Think about joining a support group for people who want to quit smoking in your local community. There are many effective programs that will help you to quit this behavior. This information is not intended to replace advice given to you by your health care provider. Make sure you discuss any questions you have with your health care provider. Document Released: 05/07/2016 Document Revised: 05/07/2016 Document Reviewed: 05/07/2016 Elsevier Interactive Patient Education  2018 [employer] Discharge Instructions  You have just completed Radiofrequency Neurotomy.  The following instructions will provide you with information and guidelines for self-care upon discharge.  If at any time you have  questions or concerns please call your physician.  General Instructions:  Be alert for signs of possible infection: redness, swelling, heat, red streaks, elevated temperature, fever.  Please notify your doctor immediately if you have unusual bleeding, trouble breathing, or loss of the ability to control your bowel or bladder.  What to expect:  Most procedures involve the use of local anesthetics (numbing medicine), steroids (anti-inflammatory medicines) and possibly sedation (relaxation or nerve medicine).  Sedation may affect your memory, not allowing you to remember the procedure, or the instructions that we give you after it.  Because of this, you doctor may want to avoid providing you important information after the procedure, since you may not remember.  The doctor will be more than happy to go over the information upon your return.  Local anesthetics, on the other hand, may cause temporary numbness and weakness of the legs or arms, depending on the location of the block.  This numbness/weaknes may last 4-6 hours ( the duration of the local anesthetic).  During this period of numbness, you must be more careful than usual to prevent any injuries to th extremity.  Steroids will begin to work immediately after injected, but on the average, it will take 6-10 days for the swelling to come down to the point where you will be able to tell a difference in terms of the pain.  In summary, you should expect for your pain to get better within 15-20 minutes after the procedure.  This relieve or numbness should last 4-6 hours, after which, it will wear off.  Once it wears off, you may experience more pain than usual for 4-6 days, or until the steroids "kick in".  This discomfort is due to the procedure itself.  To minimize this, we recommend applying ice (fill a plastic sandwich bag with ice and wrap it on a towel to prevent frostbite) to the area, 15 minutes on and 15 minutes off, the day of the procedure.  This  will minimize any swelling.  Starting the next day, you should then start with  heat (moist or dry, it does not matter).  Heat therapy should continue until the pain improves (4-6 days).  Be careful not to burn yourself.  In the case of Radiofrequency procedures, you should expect more pain than usual for 5 to 6 weeks after the procedure.  This is how long it takes the burned tissue to heal.  We cannot assess any definite results on the success of the procedure until this recovery period has elapsed.  Post-Procedure Care:  You will want to be careful in moving about.  Muscle spasms in the area of the injection may occur.  Use ice or heat to the area is often helpful.   Occasionally, a spinal headache can develop.  This is different from a normal headache in the sense that it will not go away on it's own, despite normal measures.  If you develop a headache that does not seem to respond to conservative therapy, please let your physician know.  This can be treated with an epidural blood patch.   You may experience some numbness or redness,m however it should be short lived.  If persistent numbness occurs, contact your physician. (Persistent numbness would be defined as lasting more than 4-6 hours).  Use care in moving the effected arm or leg to avoid further injury.  You may be given a sling or crutches to use temporarily.  Some discoloration may be present in your arms or leg for 24-48 hours.  If you experience shortness of breath or if your lips or fingers develop a dusky or "blue" appearance, contact your physician or go to the emergency room.  Diet  No eating limitations, unless stipulated above.  Nevertheless, if you have had sedation, you may experience some nausea.  In this case, it may be wise to wait at least two hours prior to resuming regular diet.  Comfort  You may experience a temporary increase in pain.  You will also experience tenderness at the site of injection, which may be accompanied  with muscle spasms.  Use of cold or heat is usually helpful.  Take your prescription as directed.  Always exercise caution when taking pain pills.  Activity:  For the first 24 hours after the procedure, do not drive a motor vehicle,  Operate heavy machinery or power tools or handle any weapons.  Consider walking with the use of an assistive device or accompanied by an adult for the next 24 hours.  Do not drink alcoholic beverages including beer.  Do not make any important decisions or sign any legal documents. Go home and rest today.  Resume activities tomorrow, as tolerated.  Use caution in moving about as you may experience mild leg weakness.  Use caution in cooking, use of household electrical appliances and climbing steps.  Medications  May resume pre-procedure medications.  Do not take any drugs, other than what has been prescribed to you.   Other GENERAL RISKS AND COMPLICATIONS  What are the risk, side effects and possible complications? Generally speaking, most procedures are safe.  However, with any procedure there are risks, side effects, and the possibility of complications.  The risks and complications are dependent upon the sites that are lesioned, or the type of nerve block to be performed.  The closer the procedure is to the spine, the more serious the risks are.  Great care is taken when placing the radio frequency needles, block needles or lesioning probes, but sometimes complications can occur. 1. Infection: Any time there is an injection  through the skin, there is a risk of infection.  This is why sterile conditions are used for these blocks.  There are four possible types of infection. 1. Localized skin infection. 2. Central Nervous System Infection-This can be in the form of Meningitis, which can be deadly. 3. Epidural Infections-This can be in the form of an epidural abscess, which can cause pressure inside of the spine, causing compression of the spinal cord with subsequent  paralysis. This would require an emergency surgery to decompress, and there are no guarantees that the patient would recover from the paralysis. 4. Discitis-This is an infection of the intervertebral discs.  It occurs in about 1% of discography procedures.  It is difficult to treat and it may lead to surgery.        2. Pain: the needles have to go through skin and soft tissues, will cause soreness.       3. Damage to internal structures:  The nerves to be lesioned may be near blood vessels or    other nerves which can be potentially damaged.       4. Bleeding: Bleeding is more common if the patient is taking blood thinners such as  aspirin, Coumadin, Ticiid, Plavix, etc., or if he/she have some genetic predisposition  such as hemophilia. Bleeding into the spinal canal can cause compression of the spinal  cord with subsequent paralysis.  This would require an emergency surgery to  decompress and there are no guarantees that the patient would recover from the  paralysis.       5. Pneumothorax:  Puncturing of a lung is a possibility, every time a needle is introduced in  the area of the chest or upper back.  Pneumothorax refers to free air around the  collapsed lung(s), inside of the thoracic cavity (chest cavity).  Another two possible  complications related to a similar event would include: Hemothorax and Chylothorax.   These are variations of the Pneumothorax, where instead of air around the collapsed  lung(s), you may have blood or chyle, respectively.       6. Spinal headaches: They may occur with any procedures in the area of the spine.       7. Persistent CSF (Cerebro-Spinal Fluid) leakage: This is a rare problem, but may occur  with prolonged intrathecal or epidural catheters either due to the formation of a fistulous  track or a dural tear.       8. Nerve damage: By working so close to the spinal cord, there is always a possibility of  nerve damage, which could be as serious as a permanent spinal  cord injury with  paralysis.       9. Death:  Although rare, severe deadly allergic reactions known as "Anaphylactic  reaction" can occur to any of the medications used.      10. Worsening of the symptoms:  We can always make thing worse.  What are the chances of something like this happening? Chances of any of this occuring are extremely low.  By statistics, you have more of a chance of getting killed in a motor vehicle accident: while driving to the hospital than any of the above occurring .  Nevertheless, you should be aware that they are possibilities.  In general, it is similar to taking a shower.  Everybody knows that you can slip, hit your head and get killed.  Does that mean that you should not shower again?  Nevertheless always keep in mind that statistics do  not mean anything if you happen to be on the wrong side of them.  Even if a procedure has a 1 (one) in a 1,000,000 (million) chance of going wrong, it you happen to be that one..Also, keep in mind that by statistics, you have more of a chance of having something go wrong when taking medications.  Who should not have this procedure? If you are on a blood thinning medication (e.g. Coumadin, Plavix, see list of "Blood Thinners"), or if you have an active infection going on, you should not have the procedure.  If you are taking any blood thinners, please inform your physician.  How should I prepare for this procedure?  Do not eat or drink anything at least six hours prior to the procedure.  Bring a driver with you .  It cannot be a taxi.  Come accompanied by an adult that can drive you back, and that is strong enough to help you if your legs get weak or numb from the local anesthetic.  Take all of your medicines the morning of the procedure with just enough water to swallow them.  If you have diabetes, make sure that you are scheduled to have your procedure done first thing in the morning, whenever possible.  If you have diabetes,  take only half of your insulin dose and notify our nurse that you have done so as soon as you arrive at the clinic.  If you are diabetic, but only take blood sugar pills (oral hypoglycemic), then do not take them on the morning of your procedure.  You may take them after you have had the procedure.  Do not take aspirin or any aspirin-containing medications, at least eleven (11) days prior to the procedure.  They may prolong bleeding.  Wear loose fitting clothing that may be easy to take off and that you would not mind if it got stained with Betadine or blood.  Do not wear any jewelry or perfume  Remove any nail coloring.  It will interfere with some of our monitoring equipment.  NOTE: Remember that this is not meant to be interpreted as a complete list of all possible complications.  Unforeseen problems may occur.  BLOOD THINNERS The following drugs contain aspirin or other products, which can cause increased bleeding during surgery and should not be taken for 2 weeks prior to and 1 week after surgery.  If you should need take something for relief of minor pain, you may take acetaminophen which is found in Tylenol,m Datril, Anacin-3 and Panadol. It is not blood thinner. The products listed below are.  Do not take any of the products listed below in addition to any listed on your instruction sheet.  A.P.C or A.P.C with Codeine Codeine Phosphate Capsules #3 Ibuprofen Ridaura  ABC compound Congesprin Imuran rimadil  Advil Cope Indocin Robaxisal  Alka-Seltzer Effervescent Pain Reliever and Antacid Coricidin or Coricidin-D  Indomethacin Rufen  Alka-Seltzer plus Cold Medicine Cosprin Ketoprofen S-A-C Tablets  Anacin Analgesic Tablets or Capsules Coumadin Korlgesic Salflex  Anacin Extra Strength Analgesic tablets or capsules CP-2 Tablets Lanoril Salicylate  Anaprox Cuprimine Capsules Levenox Salocol  Anexsia-D Dalteparin Magan Salsalate  Anodynos Darvon compound Magnesium Salicylate Sine-off   Ansaid Dasin Capsules Magsal Sodium Salicylate  Anturane Depen Capsules Marnal Soma  APF Arthritis pain formula Dewitt's Pills Measurin Stanback  Argesic Dia-Gesic Meclofenamic Sulfinpyrazone  Arthritis Bayer Timed Release Aspirin Diclofenac Meclomen Sulindac  Arthritis pain formula Anacin Dicumarol Medipren Supac  Analgesic (Safety coated) Arthralgen Diffunasal Mefanamic Suprofen  Arthritis Strength Bufferin Dihydrocodeine Mepro Compound Suprol  Arthropan liquid Dopirydamole Methcarbomol with Aspirin Synalgos  ASA tablets/Enseals Disalcid Micrainin Tagament  Ascriptin Doan's Midol Talwin  Ascriptin A/D Dolene Mobidin Tanderil  Ascriptin Extra Strength Dolobid Moblgesic Ticlid  Ascriptin with Codeine Doloprin or Doloprin with Codeine Momentum Tolectin  Asperbuf Duoprin Mono-gesic Trendar  Aspergum Duradyne Motrin or Motrin IB Triminicin  Aspirin plain, buffered or enteric coated Durasal Myochrisine Trigesic  Aspirin Suppositories Easprin Nalfon Trillsate  Aspirin with Codeine Ecotrin Regular or Extra Strength Naprosyn Uracel  Atromid-S Efficin Naproxen Ursinus  Auranofin Capsules Elmiron Neocylate Vanquish  Axotal Emagrin Norgesic Verin  Azathioprine Empirin or Empirin with Codeine Normiflo Vitamin E  Azolid Emprazil Nuprin Voltaren  Bayer Aspirin plain, buffered or children's or timed BC Tablets or powders Encaprin Orgaran Warfarin Sodium  Buff-a-Comp Enoxaparin Orudis Zorpin  Buff-a-Comp with Codeine Equegesic Os-Cal-Gesic   Buffaprin Excedrin plain, buffered or Extra Strength Oxalid   Bufferin Arthritis Strength Feldene Oxphenbutazone   Bufferin plain or Extra Strength Feldene Capsules Oxycodone with Aspirin   Bufferin with Codeine Fenoprofen Fenoprofen Pabalate or Pabalate-SF   Buffets II Flogesic Panagesic   Buffinol plain or Extra Strength Florinal or Florinal with Codeine Panwarfarin   Buf-Tabs Flurbiprofen Penicillamine   Butalbital Compound Four-way cold tablets  Penicillin   Butazolidin Fragmin Pepto-Bismol   Carbenicillin Geminisyn Percodan   Carna Arthritis Reliever Geopen Persantine   Carprofen Gold's salt Persistin   Chloramphenicol Goody's Phenylbutazone   Chloromycetin Haltrain Piroxlcam   Clmetidine heparin Plaquenil   Cllnoril Hyco-pap Ponstel   Clofibrate Hydroxy chloroquine Propoxyphen         Before stopping any of these medications, be sure to consult the physician who ordered them.  Some, such as Coumadin (Warfarin) are ordered to prevent or treat serious conditions such as "deep thrombosis", "pumonary embolisms", and other heart problems.  The amount of time that you may need off of the medication may also vary with the medication and the reason for which you were taking it.  If you are taking any of these medications, please make sure you notify your pain physician before you undergo any procedures.

## 2016-10-23 NOTE — Progress Notes (Signed)
Patient's Name: Paula Duncan  MRN: [id number]  Referring Provider: [name], FNP  DOB: 1961/03/13  PCP: [name], FNP  DOS: 10/23/2016  Note by: Kathlen Brunswick. Dossie Arbour, MD  Service setting: Ambulatory outpatient  Location: [facility] (AMB) Pain Management Facility  Visit type: Procedure  Specialty: Interventional Pain Management  Patient type: Established   Primary Reason for Visit: Interventional Pain Management Treatment. CC: Back Pain (lower)  Procedure:  Anesthesia, Analgesia, Anxiolysis:  Type: Therapeutic Medial Branch Facet Radiofrequency Ablation Region: Lumbar Level: L2, L3, L4, L5, & S1 Medial Branch Level(s) Laterality: Left-Sided  Type: Local Anesthesia with Moderate (Conscious) Sedation Local Anesthetic: Lidocaine 1% Route: Intravenous (IV) IV Access: Secured Sedation: Meaningful verbal contact was maintained at all times during the procedure  Indication(s): Analgesia and Anxiety  Indications: 1. Lumbar facet syndrome (Location of Primary Source of Pain) (Bilateral) (R>L)   2. Lumbar spondylosis   3. Chronic low back pain (Location of Primary Source of Pain) (Bilateral) (R>L)   4. Acute postoperative pain    [name] has either failed to respond, was unable to tolerate, or simply did not get enough benefit from other more conservative therapies including, but not limited to: 1. Over-the-counter medications 2. Anti-inflammatory medications 3. Muscle relaxants 4. Membrane stabilizers 5. Opioids 6. Physical therapy 7. Modalities (Heat, ice, etc.) 8. Invasive techniques such as nerve blocks. [name] has attained more than 50% relief of the pain from a series of diagnostic injections conducted in separate occasions.  Pain Score: Pre-procedure: 5 /10 Post-procedure: 0-No pain/10  Pre-op Assessment:  Previous date of service: 09/09/16 Service provided: Procedure (right lumbar RF) [name] is a 56 y.o. (year old), female patient, seen today for  interventional treatment. She  has a past surgical history that includes Spine surgery (1994); Abdominal hysterectomy; Exploratory laparotomy; Cesarean section; Esophagogastroduodenoscopy (07/12/09); and left heart catheterization with coronary angiogram (N/A, 04/06/2013). Her primarily concern today is the Back Pain (lower)  Initial Vital Signs: Blood pressure (!) 104/54, pulse (!) 101, temperature 98.4 F (36.9 C), resp. rate 16, height 5\' 4"  (1.626 m), weight 109 lb (49.4 kg), SpO2 100 %. BMI: 18.71 kg/m  Risk Assessment: Allergies: Reviewed. She is allergic to nsaids; tramadol hcl; tramadol hcl; chantix [varenicline]; and metronidazole.  Allergy Precautions: None required Coagulopathies: Reviewed. None identified.  Blood-thinner therapy: None at this time Active Infection(s): Reviewed. None identified. [name] is afebrile  Site Confirmation: [name] was asked to confirm the procedure and laterality before marking the site Procedure checklist: Completed Consent: Before the procedure and under the influence of no sedative(s), amnesic(s), or anxiolytics, the patient was informed of the treatment options, risks and possible complications. To fulfill our ethical and legal obligations, as recommended by the American Medical Association's Code of Ethics, I have informed the patient of my clinical impression; the nature and purpose of the treatment or procedure; the risks, benefits, and possible complications of the intervention; the alternatives, including doing nothing; the risk(s) and benefit(s) of the alternative treatment(s) or procedure(s); and the risk(s) and benefit(s) of doing nothing. The patient was provided information about the general risks and possible complications associated with the procedure. These may include, but are not limited to: failure to achieve desired goals, infection, bleeding, organ or nerve damage, allergic reactions, paralysis, and death. In addition, the patient was  informed of those risks and complications associated to Spine-related procedures, such as failure to decrease pain; infection (i.e.: Meningitis, epidural or intraspinal abscess); bleeding (i.e.: epidural hematoma, subarachnoid hemorrhage,  or any other type of intraspinal or peri-dural bleeding); organ or nerve damage (i.e.: Any type of peripheral nerve, nerve root, or spinal cord injury) with subsequent damage to sensory, motor, and/or autonomic systems, resulting in permanent pain, numbness, and/or weakness of one or several areas of the body; allergic reactions; (i.e.: anaphylactic reaction); and/or death. Furthermore, the patient was informed of those risks and complications associated with the medications. These include, but are not limited to: allergic reactions (i.e.: anaphylactic or anaphylactoid reaction(s)); adrenal axis suppression; blood sugar elevation that in diabetics may result in ketoacidosis or comma; water retention that in patients with history of congestive heart failure may result in shortness of breath, pulmonary edema, and decompensation with resultant heart failure; weight gain; swelling or edema; medication-induced neural toxicity; particulate matter embolism and blood vessel occlusion with resultant organ, and/or nervous system infarction; and/or aseptic necrosis of one or more joints. Finally, the patient was informed that Medicine is not an exact science; therefore, there is also the possibility of unforeseen or unpredictable risks and/or possible complications that may result in a catastrophic outcome. The patient indicated having understood very clearly. We have given the patient no guarantees and we have made no promises. Enough time was given to the patient to ask questions, all of which were answered to the patient's satisfaction. [name] has indicated that she wanted to continue with the procedure. Attestation: I, the ordering provider, attest that I have discussed with the  patient the benefits, risks, side-effects, alternatives, likelihood of achieving goals, and potential problems during recovery for the procedure that I have provided informed consent. Date: 10/23/2016; Time: 10:06 AM  Pre-Procedure Preparation:  Monitoring: As per clinic protocol. Respiration, ETCO2, SpO2, BP, heart rate and rhythm monitor placed and checked for adequate function Safety Precautions: Patient was assessed for positional comfort and pressure points before starting the procedure. Time-out: I initiated and conducted the "Time-out" before starting the procedure, as per protocol. The patient was asked to participate by confirming the accuracy of the "Time Out" information. Verification of the correct person, site, and procedure were performed and confirmed by me, the nursing staff, and the patient. "Time-out" conducted as per Joint Commission's Universal Protocol (UP.01.01.01). "Time-out" Date & Time: 10/23/2016; 1148 hrs.  Description of Procedure Process:   Position: Prone Target Area: For Lumbar Facet blocks, the target is the groove formed by the junction of the transverse process and superior articular process. For the L5 dorsal ramus, the target is the notch between superior articular process and sacral ala. For the S1 dorsal ramus, the target is the superior and lateral edge of the posterior S1 Sacral foramen. Approach: Paraspinal approach. Area Prepped: Entire Posterior Lumbosacral Region Prepping solution: Hibiclens (4.0% Chlorhexidine gluconate solution) Safety Precautions: Aspiration looking for blood return was conducted prior to all injections. At no point did we inject any substances, as a needle was being advanced. No attempts were made at seeking any paresthesias. Safe injection practices and needle disposal techniques used. Medications properly checked for expiration dates. SDV (single dose vial) medications used. Description of the Procedure: Protocol guidelines were  followed. The patient was placed in position over the fluoroscopy table. The target area was identified and the area prepped in the usual manner. Skin desensitized using vapocoolant spray. Skin & deeper tissues infiltrated with local anesthetic. Appropriate amount of time allowed to pass for local anesthetics to take effect. Radiofrequency needles were introduced to the area of the medial branch at the junction of the superior articular process  and transverse process using fluoroscopy. Using the [employer], sensory stimulation using 50 Hz was used to locate & identify the nerve, making sure that the needle was positioned such that there was no sensory stimulation below 0.3 V or above 0.7 V. Stimulation using 2 Hz was used to evaluate the motor component. Care was taken not to lesion any nerves that demonstrated motor stimulation of the lower extremities at an output of less than 2.5 times that of the sensory threshold, or a maximum of 2.0 V. Once satisfactory placement of the needles was achieved, the above solution was slowly injected after negative aspiration. After waiting for at least 2 minutes, the ablation was performed at 80 degrees C for 60 seconds.The needles were then removed and the area cleansed, making sure to leave some of the prepping solution back to take advantage of its long term bactericidal properties. Intra-operative Compliance: Non-compliant. Patient continued to move after the administration of the local anesthetic, despite repeated requests not to do so. Vitals:   10/23/16 1219 10/23/16 1229 10/23/16 1240 10/23/16 1250  BP: (!) 128/106 106/83 129/69 120/69  Pulse:    (!) 39  Resp: 12 16 16 16   Temp:  97.9 F (36.6 C)    SpO2: 100% 98% 98% 99%  Weight:      Height:        Start Time: 1148 hrs. End Time: 1218 hrs. Materials & Medications:  Needle(s) Type: Teflon-coated, curved tip, Radiofrequency needle(s) Gauge: 22G Length: 10cm Medication(s): We  administered lactated ringers, midazolam, fentaNYL, lidocaine, triamcinolone acetonide, and ropivacaine (PF) 2 mg/mL (0.2%). Please see chart orders for dosing details.  Imaging Guidance (Spinal):  Type of Imaging Technique: Fluoroscopy Guidance (Spinal) Indication(s): Assistance in needle guidance and placement for procedures requiring needle placement in or near specific anatomical locations not easily accessible without such assistance. Exposure Time: Please see nurses notes. Contrast: None used. Fluoroscopic Guidance: I was personally present during the use of fluoroscopy. "Tunnel Vision Technique" used to obtain the best possible view of the target area. Parallax error corrected before commencing the procedure. "Direction-depth-direction" technique used to introduce the needle under continuous pulsed fluoroscopy. Once target was reached, antero-posterior, oblique, and lateral fluoroscopic projection used confirm needle placement in all planes. Images permanently stored in EMR. Interpretation: No contrast injected. I personally interpreted the imaging intraoperatively. Adequate needle placement confirmed in multiple planes. Permanent images saved into the patient's record.  Antibiotic Prophylaxis:  Indication(s): None identified Antibiotic given: None  Post-operative Assessment:  EBL: None Complications: No immediate post-treatment complications observed by team, or reported by patient. Note: The patient tolerated the entire procedure well. A repeat set of vitals were taken after the procedure and the patient was kept under observation following institutional policy, for this type of procedure. Post-procedural neurological assessment was performed, showing return to baseline, prior to discharge. The patient was provided with post-procedure discharge instructions, including a section on how to identify potential problems. Should any problems arise concerning this procedure, the patient was given  instructions to immediately contact us, at any time, without hesitation. In any case, we plan to contact the patient by telephone for a follow-up status report regarding this interventional procedure. Comments:  No additional relevant information.  Plan of Care  Disposition: Discharge home  Discharge Date & Time: 10/23/2016; 1252 hrs.  Physician-requested Follow-up:  Return in about 6 weeks (around 12/04/2016) for post-RF eval, (6 wks), by MD, in addition, Med-Mgmt, by NP.  Future Appointments Date Time Provider [place]  12/05/2016 10:00 AM Milinda Pointer, MD [facility] None  12/16/2016 9:15 AM Vevelyn Francois, NP [facility] None  01/22/2017 10:30 AM Javier Glazier, MD [facility] None   Medications ordered for procedure: Meds ordered this encounter  Medications  . lactated ringers infusion 1,000 mL  . midazolam (VERSED) 5 MG/5ML injection 1-2 mg    Make sure Flumazenil is available in the pyxis when using this medication. If oversedation occurs, administer 0.2 mg IV over 15 sec. If after 45 sec no response, administer 0.2 mg again over 1 min; may repeat at 1 min intervals; not to exceed 4 doses (1 mg)  . fentaNYL (SUBLIMAZE) injection 25-50 mcg    Make sure Narcan is available in the pyxis when using this medication. In the event of respiratory depression (RR< 8/min): Titrate NARCAN (naloxone) in increments of 0.1 to 0.2 mg IV at 2-3 minute intervals, until desired degree of reversal.  . lidocaine 1.5 % injection 20 mL  . triamcinolone acetonide (KENALOG-40) injection 40 mg  . ropivacaine (PF) 2 mg/mL (0.2%) (NAROPIN) injection 9 mL  . oxyCODONE-acetaminophen (PERCOCET) 5-325 MG tablet    Sig: Take 1 tablet by mouth every 8 (eight) hours as needed for severe pain.    Dispense:  30 tablet    Refill:  0    For acute post-operative pain. Not to be refilled. To last until: 11/02/16   Medications administered: We administered lactated ringers, midazolam, fentaNYL, lidocaine,  triamcinolone acetonide, and ropivacaine (PF) 2 mg/mL (0.2%).  See the medical record for exact dosing, route, and time of administration.  Lab-work, Procedure(s), & Referral(s) Ordered: Orders Placed This Encounter  Procedures  . Radiofrequency,Lumbar  . DG C-Arm 1-60 Min-No Report  . Informed Consent Details: Transcribe to consent form and obtain patient signature  . Provider attestation of informed consent for procedure/surgical case  . Verify informed consent  . Discharge instructions  . Follow-up   Imaging Ordered: Results for orders placed in visit on 09/09/16  DG C-Arm 1-60 Min-No Report   Narrative Fluoroscopy was utilized by the requesting physician.  No radiographic  interpretation.    New Prescriptions   OXYCODONE-ACETAMINOPHEN (PERCOCET) 5-325 MG TABLET    Take 1 tablet by mouth every 8 (eight) hours as needed for severe pain.   Primary Care Physician: [name], FNP Location: [facility] Note by: Kathlen Brunswick. Dossie Arbour, M.D, DABA, DABAPM, DABPM, DABIPP, FIPP Date: 10/23/2016; Time: 1:19 PM  Disclaimer:  Medicine is not an Chief Strategy Officer. The only guarantee in medicine is that nothing is guaranteed. It is important to note that the decision to proceed with this intervention was based on the information collected from the patient. The Data and conclusions were drawn from the patient's questionnaire, the interview, and the physical examination. Because the information was provided in large part by the patient, it cannot be guaranteed that it has not been purposely or unconsciously manipulated. Every effort has been made to obtain as much relevant data as possible for this evaluation. It is important to note that the conclusions that lead to this procedure are derived in large part from the available data. Always take into account that the treatment will also be dependent on availability of resources and existing treatment guidelines, considered  by other Pain Management Practitioners as being common knowledge and practice, at the time of the intervention. For Medico-Legal purposes, it is also important to point out that variation in procedural techniques and pharmacological choices are the acceptable norm. The  indications, contraindications, technique, and results of the above procedure should only be interpreted and judged by a Board-Certified Interventional Pain Specialist with extensive familiarity and expertise in the same exact procedure and technique.  Instructions provided at this appointment: Patient Instructions   ____________________________________________________________________________________________  Post-Procedure instructions Instructions:  Apply ice: Fill a plastic sandwich bag with crushed ice. Cover it with a small towel and apply to injection site. Apply for 15 minutes then remove x 15 minutes. Repeat sequence on day of procedure, until you go to bed. The purpose is to minimize swelling and discomfort after procedure.  Apply heat: Apply heat to procedure site starting the day following the procedure. The purpose is to treat any soreness and discomfort from the procedure.  Food intake: Start with clear liquids (like water) and advance to regular food, as tolerated.   Physical activities: Keep activities to a minimum for the first 8 hours after the procedure.   Driving: If you have received any sedation, you are not allowed to drive for 24 hours after your procedure.  Blood thinner: Restart your blood thinner 6 hours after your procedure. (Only for those taking blood thinners)  Insulin: As soon as you can eat, you may resume your normal dosing schedule. (Only for those taking insulin)  Infection prevention: Keep procedure site clean and dry.  Post-procedure Pain Diary: Extremely important that this be done correctly and accurately. Recorded information will be used to determine the next step in treatment.  Pain  evaluated is that of treated area only. Do not include pain from an untreated area.  Complete every hour, on the hour, for the initial 8 hours. Set an alarm to help you do this part accurately.  Do not go to sleep and have it completed later. It will not be accurate.  Follow-up appointment: Keep your follow-up appointment after the procedure. Usually 2 weeks for most procedures. (6 weeks in the case of radiofrequency.) Bring you pain diary.  Expect:  From numbing medicine (AKA: Local Anesthetics): Numbness or decrease in pain.  Onset: Full effect within 15 minutes of injected.  Duration: It will depend on the type of local anesthetic used. On the average, 1 to 8 hours.   From steroids: Decrease in swelling or inflammation. Once inflammation is improved, relief of the pain will follow.  Onset of benefits: Depends on the amount of swelling present. The more swelling, the longer it will take for the benefits to be seen. In some cases, up to 10 days.  Duration: Steroids will stay in the system x 2 weeks. Duration of benefits will depend on multiple posibilities including persistent irritating factors.  From procedure: Some discomfort is to be expected once the numbing medicine wears off. This should be minimal if ice and heat are applied as instructed. Call if:  You experience numbness and weakness that gets worse with time, as opposed to wearing off.  New onset bowel or bladder incontinence. (Spinal procedures only)  Emergency Numbers:  Durning business hours (Monday - Thursday, 8:00 AM - 4:00 PM) (Friday, 9:00 AM - 12:00 Noon): [phone]  After hours: [phone] ____________________________________________________________________________________________   Steps to Quit Smoking Smoking tobacco can be harmful to your health and can affect almost every organ in your body. Smoking puts you, and those around you, at risk for developing many serious chronic diseases. Quitting  smoking is difficult, but it is one of the best things that you can do for your health. It is never too late to quit.  What are the benefits of quitting smoking? When you quit smoking, you lower your risk of developing serious diseases and conditions, such as:  Lung cancer or lung disease, such as COPD.  Heart disease.  Stroke.  Heart attack.  Infertility.  Osteoporosis and bone fractures.  Additionally, symptoms such as coughing, wheezing, and shortness of breath may get better when you quit. You may also find that you get sick less often because your body is stronger at fighting off colds and infections. If you are pregnant, quitting smoking can help to reduce your chances of having a baby of low birth weight. How do I get ready to quit? When you decide to quit smoking, create a plan to make sure that you are successful. Before you quit:  Pick a date to quit. Set a date within the next two weeks to give you time to prepare.  Write down the reasons why you are quitting. Keep this list in places where you will see it often, such as on your bathroom mirror or in your car or wallet.  Identify the people, places, things, and activities that make you want to smoke (triggers) and avoid them. Make sure to take these actions: ? Throw away all cigarettes at home, at work, and in your car. ? Throw away smoking accessories, such as Scientist, research (medical). ? Clean your car and make sure to empty the ashtray. ? Clean your home, including curtains and carpets.  Tell your family, friends, and coworkers that you are quitting. Support from your loved ones can make quitting easier.  Talk with your health care provider about your options for quitting smoking.  Find out what treatment options are covered by your health insurance.  What strategies can I use to quit smoking? Talk with your healthcare provider about different strategies to quit smoking. Some strategies include:  Quitting smoking  altogether instead of gradually lessening how much you smoke over a period of time. Research shows that quitting "cold [place]" is more successful than gradually quitting.  Attending in-person counseling to help you build problem-solving skills. You are more likely to have success in quitting if you attend several counseling sessions. Even short sessions of 10 minutes can be effective.  Finding resources and support systems that can help you to quit smoking and remain smoke-free after you quit. These resources are most helpful when you use them often. They can include: ? Online chats with a Social worker. ? Telephone quitlines. ? Careers information officer. ? Support groups or group counseling. ? Text messaging programs. ? Mobile phone applications.  Taking medicines to help you quit smoking. (If you are pregnant or breastfeeding, talk with your health care provider first.) Some medicines contain nicotine and some do not. Both types of medicines help with cravings, but the medicines that include nicotine help to relieve withdrawal symptoms. Your health care provider may recommend: ? Nicotine patches, gum, or lozenges. ? Nicotine inhalers or sprays. ? Non-nicotine medicine that is taken by mouth.  Talk with your health care provider about combining strategies, such as taking medicines while you are also receiving in-person counseling. Using these two strategies together makes you more likely to succeed in quitting than if you used either strategy on its own. If you are pregnant or breastfeeding, talk with your health care provider about finding counseling or other support strategies to quit smoking. Do not take medicine to help you quit smoking unless told to do so by your health care provider. What things can I  do to make it easier to quit? Quitting smoking might feel overwhelming at first, but there is a lot that you can do to make it easier. Take these important actions:  Reach out to your family  and friends and ask that they support and encourage you during this time. Call telephone quitlines, reach out to support groups, or work with a counselor for support.  Ask people who smoke to avoid smoking around you.  Avoid places that trigger you to smoke, such as bars, parties, or smoke-break areas at work.  Spend time around people who do not smoke.  Lessen stress in your life, because stress can be a smoking trigger for some people. To lessen stress, try: ? Exercising regularly. ? Deep-breathing exercises. ? Yoga. ? Meditating. ? Performing a body scan. This involves closing your eyes, scanning your body from head to toe, and noticing which parts of your body are particularly tense. Purposefully relax the muscles in those areas.  Download or purchase mobile phone or tablet apps (applications) that can help you stick to your quit plan by providing reminders, tips, and encouragement. There are many free apps, such as QuitGuide from the [employer] Office manager for Disease Control and Prevention). You can find other support for quitting smoking (smoking cessation) through smokefree.gov and other websites.  How will I feel when I quit smoking? Within the first 24 hours of quitting smoking, you may start to feel some withdrawal symptoms. These symptoms are usually most noticeable 2-3 days after quitting, but they usually do not last beyond 2-3 weeks. Changes or symptoms that you might experience include:  Mood swings.  Restlessness, anxiety, or irritation.  Difficulty concentrating.  Dizziness.  Strong cravings for sugary foods in addition to nicotine.  Mild weight gain.  Constipation.  Nausea.  Coughing or a sore throat.  Changes in how your medicines work in your body.  A depressed mood.  Difficulty sleeping (insomnia).  After the first 2-3 weeks of quitting, you may start to notice more positive results, such as:  Improved sense of smell and taste.  Decreased coughing and sore  throat.  Slower heart rate.  Lower blood pressure.  Clearer skin.  The ability to breathe more easily.  Fewer sick days.  Quitting smoking is very challenging for most people. Do not get discouraged if you are not successful the first time. Some people need to make many attempts to quit before they achieve long-term success. Do your best to stick to your quit plan, and talk with your health care provider if you have any questions or concerns. This information is not intended to replace advice given to you by your health care provider. Make sure you discuss any questions you have with your health care provider. Document Released: 04/16/2001 Document Revised: 12/19/2015 Document Reviewed: 09/06/2014 Elsevier Interactive Patient Education  2017 [employer].  Steps to Quit Smoking Smoking tobacco can be bad for your health. It can also affect almost every organ in your body. Smoking puts you and people around you at risk for many serious long-lasting (chronic) diseases. Quitting smoking is hard, but it is one of the best things that you can do for your health. It is never too late to quit. What are the benefits of quitting smoking? When you quit smoking, you lower your risk for getting serious diseases and conditions. They can include:  Lung cancer or lung disease.  Heart disease.  Stroke.  Heart attack.  Not being able to have children (infertility).  Weak  bones (osteoporosis) and broken bones (fractures).  If you have coughing, wheezing, and shortness of breath, those symptoms may get better when you quit. You may also get sick less often. If you are pregnant, quitting smoking can help to lower your chances of having a baby of low birth weight. What can I do to help me quit smoking? Talk with your doctor about what can help you quit smoking. Some things you can do (strategies) include:  Quitting smoking totally, instead of slowly cutting back how much you smoke over a period of  time.  Going to in-person counseling. You are more likely to quit if you go to many counseling sessions.  Using resources and support systems, such as: ? Database administrator with a Social worker. ? Phone quitlines. ? Careers information officer. ? Support groups or group counseling. ? Text messaging programs. ? Mobile phone apps or applications.  Taking medicines. Some of these medicines may have nicotine in them. If you are pregnant or breastfeeding, do not take any medicines to quit smoking unless your doctor says it is okay. Talk with your doctor about counseling or other things that can help you.  Talk with your doctor about using more than one strategy at the same time, such as taking medicines while you are also going to in-person counseling. This can help make quitting easier. What things can I do to make it easier to quit? Quitting smoking might feel very hard at first, but there is a lot that you can do to make it easier. Take these steps:  Talk to your family and friends. Ask them to support and encourage you.  Call phone quitlines, reach out to support groups, or work with a Social worker.  Ask people who smoke to not smoke around you.  Avoid places that make you want (trigger) to smoke, such as: ? Bars. ? Parties. ? Smoke-break areas at work.  Spend time with people who do not smoke.  Lower the stress in your life. Stress can make you want to smoke. Try these things to help your stress: ? Getting regular exercise. ? Deep-breathing exercises. ? Yoga. ? Meditating. ? Doing a body scan. To do this, close your eyes, focus on one area of your body at a time from head to toe, and notice which parts of your body are tense. Try to relax the muscles in those areas.  Download or buy apps on your mobile phone or tablet that can help you stick to your quit plan. There are many free apps, such as QuitGuide from the [employer] Office manager for Disease Control and Prevention). You can find more support from  smokefree.gov and other websites.  This information is not intended to replace advice given to you by your health care provider. Make sure you discuss any questions you have with your health care provider. Document Released: 02/16/2009 Document Revised: 12/19/2015 Document Reviewed: 09/06/2014 Elsevier Interactive Patient Education  2018 [employer].  Smoking Tobacco Information Smoking tobacco will very likely harm your health. Tobacco contains a poisonous (toxic), colorless chemical called nicotine. Nicotine affects the brain and makes tobacco addictive. This change in your brain can make it hard to stop smoking. Tobacco also has other toxic chemicals that can hurt your body and raise your risk of many cancers. How can smoking tobacco affect me? Smoking tobacco can increase your chances of having serious health conditions, such as:  Cancer. Smoking is most commonly associated with lung cancer, but can lead to cancer in other parts of  the body.  Chronic obstructive pulmonary disease (COPD). This is a long-term lung condition that makes it hard to breathe. It also gets worse over time.  High blood pressure (hypertension), heart disease, stroke, or heart attack.  Lung infections, such as pneumonia.  Cataracts. This is when the lenses in the eyes become clouded.  Digestive problems. This may include peptic ulcers, heartburn, and gastroesophageal reflux disease (GERD).  Oral health problems, such as gum disease and tooth loss.  Loss of taste and smell.  Smoking can affect your appearance by causing:  Wrinkles.  Yellow or stained teeth, fingers, and fingernails.  Smoking tobacco can also affect your social life.  Many workplaces, [employer], hotels, and public places are tobacco-free. This means that you may experience challenges in finding places to smoke when away from home.  The cost of a smoking habit can be expensive. Expenses for someone who smokes come in two ways: ? You  spend money on a regular basis to buy tobacco. ? Your health care costs in the long-term are higher if you smoke.  Tobacco smoke can also affect the health of those around you. Children of smokers have greater chances of: ? Sudden infant death syndrome (SIDS). ? Ear infections. ? Lung infections.  What lifestyle changes can be made?  Do not start smoking. Quit if you already do.  To quit smoking: ? Make a plan to quit smoking and commit yourself to it. Look for programs to help you and ask your health care provider for recommendations and ideas. ? Talk with your health care provider about using nicotine replacement medicines to help you quit. Medicine replacement medicines include gum, lozenges, patches, sprays, or pills. ? Do not replace cigarette smoking with electronic cigarettes, which are commonly called e-cigarettes. The safety of e-cigarettes is not known, and some may contain harmful chemicals. ? Avoid places, people, or situations that tempt you to smoke. ? If you try to quit but return to smoking, don't give up hope. It is very common for people to try a number of times before they fully succeed. When you feel ready again, give it another try.  Quitting smoking might affect the way you eat as well as your weight. Be prepared to monitor your eating habits. Get support in planning and following a healthy diet.  Ask your health care provider about having regular tests (screenings) to check for cancer. This may include blood tests, imaging tests, and other tests.  Exercise regularly. Consider taking walks, joining a gym, or doing yoga or exercise classes.  Develop skills to manage your stress. These skills include meditation. What are the benefits of quitting smoking? By quitting smoking, you may:  Lower your risk of getting cancer and other diseases caused by smoking.  Live longer.  Breathe better.  Lower your blood pressure and heart rate.  Stop your addiction to  tobacco.  Stop creating secondhand smoke that hurts other people.  Improve your sense of taste and smell.  Look better over time, due to having fewer wrinkles and less staining.  What can happen if changes are not made? If you do not stop smoking, you may:  Get cancer and other diseases.  Develop COPD or other long-term (chronic) lung conditions.  Develop serious problems with your heart and blood vessels (cardiovascular system).  Need more tests to screen for problems caused by smoking.  Have higher, long-term healthcare costs from medicines or treatments related to smoking.  Continue to have worsening changes in your lungs,  mouth, and nose.  Where to find support: To get support to quit smoking, consider:  Asking your health care provider for more information and resources.  Taking classes to learn more about quitting smoking.  Looking for local organizations that offer resources about quitting smoking.  Joining a support group for people who want to quit smoking in your local community.  Where to find more information: You may find more information about quitting smoking from:  HelpGuide.org: www.helpguide.org/articles/addictions/how-to-quit-smoking.htm  https://hall.com/: smokefree.gov  American Lung Association: www.lung.org  Contact a health care provider if:  You have problems breathing.  Your lips, nose, or fingers turn blue.  You have chest pain.  You are coughing up blood.  You feel faint or you pass out.  You have other noticeable changes that cause you to worry. Summary  Smoking tobacco can negatively affect your health, the health of those around you, your finances, and your social life.  Do not start smoking. Quit if you already do. If you need help quitting, ask your health care provider.  Think about joining a support group for people who want to quit smoking in your local community. There are many effective programs that will help you to quit  this behavior. This information is not intended to replace advice given to you by your health care provider. Make sure you discuss any questions you have with your health care provider. Document Released: 05/07/2016 Document Revised: 05/07/2016 Document Reviewed: 05/07/2016 Elsevier Interactive Patient Education  2018 [place] RF Discharge Instructions  You have just completed Radiofrequency Neurotomy.  The following instructions will provide you with information and guidelines for self-care upon discharge.  If at any time you have questions or concerns please call your physician.  General Instructions:  Be alert for signs of possible infection: redness, swelling, heat, red streaks, elevated temperature, fever.  Please notify your doctor immediately if you have unusual bleeding, trouble breathing, or loss of the ability to control your bowel or bladder.  What to expect:  Most procedures involve the use of local anesthetics (numbing medicine), steroids (anti-inflammatory medicines) and possibly sedation (relaxation or nerve medicine).  Sedation may affect your memory, not allowing you to remember the procedure, or the instructions that we give you after it.  Because of this, you doctor may want to avoid providing you important information after the procedure, since you may not remember.  The doctor will be more than happy to go over the information upon your return.  Local anesthetics, on the other hand, may cause temporary numbness and weakness of the legs or arms, depending on the location of the block.  This numbness/weaknes may last 4-6 hours ( the duration of the local anesthetic).  During this period of numbness, you must be more careful than usual to prevent any injuries to th extremity.  Steroids will begin to work immediately after injected, but on the average, it will take 6-10 days for the swelling to come down to the point where you will be able to tell a difference in terms of  the pain.  In summary, you should expect for your pain to get better within 15-20 minutes after the procedure.  This relieve or numbness should last 4-6 hours, after which, it will wear off.  Once it wears off, you may experience more pain than usual for 4-6 days, or until the steroids "kick in".  This discomfort is due to the procedure itself.  To minimize this, we recommend applying ice (fill a plastic  sandwich bag with ice and wrap it on a towel to prevent frostbite) to the area, 15 minutes on and 15 minutes off, the day of the procedure.  This will minimize any swelling.  Starting the next day, you should then start with heat (moist or dry, it does not matter).  Heat therapy should continue until the pain improves (4-6 days).  Be careful not to burn yourself.  In the case of Radiofrequency procedures, you should expect more pain than usual for 5 to 6 weeks after the procedure.  This is how long it takes the burned tissue to heal.  We cannot assess any definite results on the success of the procedure until this recovery period has elapsed.  Post-Procedure Care:  You will want to be careful in moving about.  Muscle spasms in the area of the injection may occur.  Use ice or heat to the area is often helpful.   Occasionally, a spinal headache can develop.  This is different from a normal headache in the sense that it will not go away on it's own, despite normal measures.  If you develop a headache that does not seem to respond to conservative therapy, please let your physician know.  This can be treated with an epidural blood patch.   You may experience some numbness or redness,m however it should be short lived.  If persistent numbness occurs, contact your physician. (Persistent numbness would be defined as lasting more than 4-6 hours).  Use care in moving the effected arm or leg to avoid further injury.  You may be given a sling or crutches to use temporarily.  Some discoloration may be present in your arms  or leg for 24-48 hours.  If you experience shortness of breath or if your lips or fingers develop a dusky or "blue" appearance, contact your physician or go to the emergency room.  Diet  No eating limitations, unless stipulated above.  Nevertheless, if you have had sedation, you may experience some nausea.  In this case, it may be wise to wait at least two hours prior to resuming regular diet.  Comfort  You may experience a temporary increase in pain.  You will also experience tenderness at the site of injection, which may be accompanied with muscle spasms.  Use of cold or heat is usually helpful.  Take your prescription as directed.  Always exercise caution when taking pain pills.  Activity:  For the first 24 hours after the procedure, do not drive a motor vehicle,  Operate heavy machinery or power tools or handle any weapons.  Consider walking with the use of an assistive device or accompanied by an adult for the next 24 hours.  Do not drink alcoholic beverages including beer.  Do not make any important decisions or sign any legal documents. Go home and rest today.  Resume activities tomorrow, as tolerated.  Use caution in moving about as you may experience mild leg weakness.  Use caution in cooking, use of household electrical appliances and climbing steps.  Medications  May resume pre-procedure medications.  Do not take any drugs, other than what has been prescribed to you.   Other GENERAL RISKS AND COMPLICATIONS  What are the risk, side effects and possible complications? Generally speaking, most procedures are safe.  However, with any procedure there are risks, side effects, and the possibility of complications.  The risks and complications are dependent upon the sites that are lesioned, or the type of nerve block to be performed.  The closer the procedure is to the spine, the more serious the risks are.  Great care is taken when placing the radio frequency needles, block needles or  lesioning probes, but sometimes complications can occur. 1. Infection: Any time there is an injection through the skin, there is a risk of infection.  This is why sterile conditions are used for these blocks.  There are four possible types of infection. 1. Localized skin infection. 2. Central Nervous System Infection-This can be in the form of Meningitis, which can be deadly. 3. Epidural Infections-This can be in the form of an epidural abscess, which can cause pressure inside of the spine, causing compression of the spinal cord with subsequent paralysis. This would require an emergency surgery to decompress, and there are no guarantees that the patient would recover from the paralysis. 4. Discitis-This is an infection of the intervertebral discs.  It occurs in about 1% of discography procedures.  It is difficult to treat and it may lead to surgery.        2. Pain: the needles have to go through skin and soft tissues, will cause soreness.       3. Damage to internal structures:  The nerves to be lesioned may be near blood vessels or    other nerves which can be potentially damaged.       4. Bleeding: Bleeding is more common if the patient is taking blood thinners such as  aspirin, Coumadin, Ticiid, Plavix, etc., or if he/she have some genetic predisposition  such as hemophilia. Bleeding into the spinal canal can cause compression of the spinal  cord with subsequent paralysis.  This would require an emergency surgery to  decompress and there are no guarantees that the patient would recover from the  paralysis.       5. Pneumothorax:  Puncturing of a lung is a possibility, every time a needle is introduced in  the area of the chest or upper back.  Pneumothorax refers to free air around the  collapsed lung(s), inside of the thoracic cavity (chest cavity).  Another two possible  complications related to a similar event would include: Hemothorax and Chylothorax.   These are variations of the Pneumothorax,  where instead of air around the collapsed  lung(s), you may have blood or chyle, respectively.       6. Spinal headaches: They may occur with any procedures in the area of the spine.       7. Persistent CSF (Cerebro-Spinal Fluid) leakage: This is a rare problem, but may occur  with prolonged intrathecal or epidural catheters either due to the formation of a fistulous  track or a dural tear.       8. Nerve damage: By working so close to the spinal cord, there is always a possibility of  nerve damage, which could be as serious as a permanent spinal cord injury with  paralysis.       9. Death:  Although rare, severe deadly allergic reactions known as "Anaphylactic  reaction" can occur to any of the medications used.      10. Worsening of the symptoms:  We can always make thing worse.  What are the chances of something like this happening? Chances of any of this occuring are extremely low.  By statistics, you have more of a chance of getting killed in a motor vehicle accident: while driving to the hospital than any of the above occurring .  Nevertheless, you should be aware that they are  possibilities.  In general, it is similar to taking a shower.  Everybody knows that you can slip, hit your head and get killed.  Does that mean that you should not shower again?  Nevertheless always keep in mind that statistics do not mean anything if you happen to be on the wrong side of them.  Even if a procedure has a 1 (one) in a 1,000,000 (million) chance of going wrong, it you happen to be that one..Also, keep in mind that by statistics, you have more of a chance of having something go wrong when taking medications.  Who should not have this procedure? If you are on a blood thinning medication (e.g. Coumadin, Plavix, see list of "Blood Thinners"), or if you have an active infection going on, you should not have the procedure.  If you are taking any blood thinners, please inform your physician.  How should I prepare  for this procedure?  Do not eat or drink anything at least six hours prior to the procedure.  Bring a driver with you .  It cannot be a taxi.  Come accompanied by an adult that can drive you back, and that is strong enough to help you if your legs get weak or numb from the local anesthetic.  Take all of your medicines the morning of the procedure with just enough water to swallow them.  If you have diabetes, make sure that you are scheduled to have your procedure done first thing in the morning, whenever possible.  If you have diabetes, take only half of your insulin dose and notify our nurse that you have done so as soon as you arrive at the clinic.  If you are diabetic, but only take blood sugar pills (oral hypoglycemic), then do not take them on the morning of your procedure.  You may take them after you have had the procedure.  Do not take aspirin or any aspirin-containing medications, at least eleven (11) days prior to the procedure.  They may prolong bleeding.  Wear loose fitting clothing that may be easy to take off and that you would not mind if it got stained with Betadine or blood.  Do not wear any jewelry or perfume  Remove any nail coloring.  It will interfere with some of our monitoring equipment.  NOTE: Remember that this is not meant to be interpreted as a complete list of all possible complications.  Unforeseen problems may occur.  BLOOD THINNERS The following drugs contain aspirin or other products, which can cause increased bleeding during surgery and should not be taken for 2 weeks prior to and 1 week after surgery.  If you should need take something for relief of minor pain, you may take acetaminophen which is found in Tylenol,m Datril, Anacin-3 and Panadol. It is not blood thinner. The products listed below are.  Do not take any of the products listed below in addition to any listed on your instruction sheet.  A.P.C or A.P.C with Codeine Codeine Phosphate Capsules #3  Ibuprofen Ridaura  ABC compound Congesprin Imuran rimadil  Advil Cope Indocin Robaxisal  Alka-Seltzer Effervescent Pain Reliever and Antacid Coricidin or Coricidin-D  Indomethacin Rufen  Alka-Seltzer plus Cold Medicine Cosprin Ketoprofen S-A-C Tablets  Anacin Analgesic Tablets or Capsules Coumadin Korlgesic Salflex  Anacin Extra Strength Analgesic tablets or capsules CP-2 Tablets Lanoril Salicylate  Anaprox Cuprimine Capsules Levenox Salocol  Anexsia-D Dalteparin Magan Salsalate  Anodynos Darvon compound Magnesium Salicylate Sine-off  Ansaid Dasin Capsules Magsal Sodium Salicylate  Anturane  Depen Capsules Marnal Soma  APF Arthritis pain formula Dewitt's Pills Measurin Stanback  Argesic Dia-Gesic Meclofenamic Sulfinpyrazone  Arthritis Bayer Timed Release Aspirin Diclofenac Meclomen Sulindac  Arthritis pain formula Anacin Dicumarol Medipren Supac  Analgesic (Safety coated) Arthralgen Diffunasal Mefanamic Suprofen  Arthritis Strength Bufferin Dihydrocodeine Mepro Compound Suprol  Arthropan liquid Dopirydamole Methcarbomol with Aspirin Synalgos  ASA tablets/Enseals Disalcid Micrainin Tagament  Ascriptin Doan's Midol Talwin  Ascriptin A/D Dolene Mobidin Tanderil  Ascriptin Extra Strength Dolobid Moblgesic Ticlid  Ascriptin with Codeine Doloprin or Doloprin with Codeine Momentum Tolectin  Asperbuf Duoprin Mono-gesic Trendar  Aspergum Duradyne Motrin or Motrin IB Triminicin  Aspirin plain, buffered or enteric coated Durasal Myochrisine Trigesic  Aspirin Suppositories Easprin Nalfon Trillsate  Aspirin with Codeine Ecotrin Regular or Extra Strength Naprosyn Uracel  Atromid-S Efficin Naproxen Ursinus  Auranofin Capsules Elmiron Neocylate Vanquish  Axotal Emagrin Norgesic Verin  Azathioprine Empirin or Empirin with Codeine Normiflo Vitamin E  Azolid Emprazil Nuprin Voltaren  Bayer Aspirin plain, buffered or children's or timed BC Tablets or powders Encaprin Orgaran Warfarin Sodium    Buff-a-Comp Enoxaparin Orudis Zorpin  Buff-a-Comp with Codeine Equegesic Os-Cal-Gesic   Buffaprin Excedrin plain, buffered or Extra Strength Oxalid   Bufferin Arthritis Strength Feldene Oxphenbutazone   Bufferin plain or Extra Strength Feldene Capsules Oxycodone with Aspirin   Bufferin with Codeine Fenoprofen Fenoprofen Pabalate or Pabalate-SF   Buffets II Flogesic Panagesic   Buffinol plain or Extra Strength Florinal or Florinal with Codeine Panwarfarin   Buf-Tabs Flurbiprofen Penicillamine   Butalbital Compound Four-way cold tablets Penicillin   Butazolidin Fragmin Pepto-Bismol   Carbenicillin Geminisyn Percodan   Carna Arthritis Reliever Geopen Persantine   Carprofen Gold's salt Persistin   Chloramphenicol Goody's Phenylbutazone   Chloromycetin Haltrain Piroxlcam   Clmetidine heparin Plaquenil   Cllnoril Hyco-pap Ponstel   Clofibrate Hydroxy chloroquine Propoxyphen         Before stopping any of these medications, be sure to consult the physician who ordered them.  Some, such as Coumadin (Warfarin) are ordered to prevent or treat serious conditions such as "deep thrombosis", "pumonary embolisms", and other heart problems.  The amount of time that you may need off of the medication may also vary with the medication and the reason for which you were taking it.  If you are taking any of these medications, please make sure you notify your pain physician before you undergo any procedures.

## 2016-10-24 ENCOUNTER — Telehealth: Payer: Self-pay | Admitting: *Deleted

## 2016-10-24 NOTE — Telephone Encounter (Signed)
Denies any needs at this time. States she is glad that Dr Dossie Arbour gave her the extra pain pills post procedure. Instructed to call if needed.

## 2016-10-29 ENCOUNTER — Telehealth: Payer: Self-pay | Admitting: Pain Medicine

## 2016-10-29 NOTE — Telephone Encounter (Signed)
Spoke with pharmacy to let answer their question re; oxycodone qty 30 fill from 10/23/16 and whether okay to fill other medications.  Patient was s/p RF procedure which additional medication is normally ordered. Ok to fill hydrocodone-apap 10-325 mg today.

## 2016-10-29 NOTE — Telephone Encounter (Signed)
Dee at [facility] called asking to verify medication refill dates for patient. Please call pharmacy

## 2016-10-30 ENCOUNTER — Ambulatory Visit: Payer: Medicare Other | Admitting: Pain Medicine

## 2016-10-31 ENCOUNTER — Telehealth: Payer: Self-pay | Admitting: Pain Medicine

## 2016-10-31 NOTE — Telephone Encounter (Signed)
Spoke with patient and she states that post RF pain is severe and pain medicine is not helping her pain.

## 2016-10-31 NOTE — Telephone Encounter (Signed)
After speaking with Dr Dossie Arbour I have called the patient with his advise.    Patient is currently taking Gabapentin 100 - 300 mg qhs, instructed to begin gabapentin daytime titration schedule  1 capsule, 100 mg at noon x 7 days.  Then 100 mg capsule at dinner x 7 days.  At this point she will be taking her medication 4 times per day with the understanding that she should not be taking any closer than 6 hours apart.  Patient verbalizes u/o titration schedule and if she needs further pain relief she can call us back to see about increasing titration schedule/gabapentin dose.  Also, patient can take NSAID for breakthrough pain, she does state that she bruises easily after NSAID.  Told her to only use this occasionally as needed, verbalizes u/o instructions.  Patient offered to come in if she does not feel reassurred and that we could see her this afternoon, patient declines coming to be seen today.  Patient ask if there is anyway he would increase her pain medication.  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  Patient instructed to please call back if she needs further advice in this matter.  Also explained that the s/s she is describing sound completely normal and expected post RF, shooting, electrical type sensation.  Patient feels reassurred and will call if needed.

## 2016-10-31 NOTE — Telephone Encounter (Signed)
Patient called stating her lower back is killing her right now, meds are not helping. This is since procedure. Would like to speak with nurse.

## 2016-11-01 ENCOUNTER — Telehealth: Payer: Self-pay | Admitting: Pain Medicine

## 2016-11-01 NOTE — Telephone Encounter (Signed)
Returned patient call. Informed patient that she should go to the ED if felt like she needed to or we could schedule her to see Crystal on Monday.  Patient states she would rather come in on MOnday.  Call sent to [facility] for appointment.

## 2016-11-01 NOTE — Telephone Encounter (Signed)
Patient in extreme pain and now starting to fall when walking per vmail left Thurs at 4:47. Please call patient

## 2016-11-04 ENCOUNTER — Ambulatory Visit: Payer: Medicare Other | Admitting: Nurse Practitioner

## 2016-11-15 ENCOUNTER — Other Ambulatory Visit: Payer: Self-pay | Admitting: Pulmonary Disease

## 2016-12-02 ENCOUNTER — Telehealth: Payer: Self-pay | Admitting: Pain Medicine

## 2016-12-02 NOTE — Telephone Encounter (Signed)
Patient did not realize that she had two refills left.

## 2016-12-02 NOTE — Telephone Encounter (Signed)
Patient lvmail asking for refill on gabapentin. She has appt 12-04-16 for follow up and 12-16-16 for med refill, please call patient

## 2016-12-04 ENCOUNTER — Encounter: Payer: Self-pay | Admitting: Pain Medicine

## 2016-12-04 ENCOUNTER — Ambulatory Visit: Payer: Medicare Other | Attending: Pain Medicine | Admitting: Pain Medicine

## 2016-12-04 VITALS — BP 145/72 | HR 91 | Temp 98.6°F | Resp 16 | Ht 64.0 in | Wt 105.0 lb

## 2016-12-04 DIAGNOSIS — M791 Myalgia: Secondary | ICD-10-CM

## 2016-12-04 DIAGNOSIS — Z8371 Family history of colonic polyps: Secondary | ICD-10-CM | POA: Insufficient documentation

## 2016-12-04 DIAGNOSIS — Z811 Family history of alcohol abuse and dependence: Secondary | ICD-10-CM | POA: Insufficient documentation

## 2016-12-04 DIAGNOSIS — Z87828 Personal history of other (healed) physical injury and trauma: Secondary | ICD-10-CM | POA: Insufficient documentation

## 2016-12-04 DIAGNOSIS — F17220 Nicotine dependence, chewing tobacco, uncomplicated: Secondary | ICD-10-CM | POA: Diagnosis not present

## 2016-12-04 DIAGNOSIS — M25551 Pain in right hip: Secondary | ICD-10-CM | POA: Diagnosis not present

## 2016-12-04 DIAGNOSIS — M545 Low back pain, unspecified: Secondary | ICD-10-CM

## 2016-12-04 DIAGNOSIS — M961 Postlaminectomy syndrome, not elsewhere classified: Secondary | ICD-10-CM | POA: Insufficient documentation

## 2016-12-04 DIAGNOSIS — Z7983 Long term (current) use of bisphosphonates: Secondary | ICD-10-CM | POA: Insufficient documentation

## 2016-12-04 DIAGNOSIS — Z981 Arthrodesis status: Secondary | ICD-10-CM | POA: Insufficient documentation

## 2016-12-04 DIAGNOSIS — I10 Essential (primary) hypertension: Secondary | ICD-10-CM | POA: Diagnosis not present

## 2016-12-04 DIAGNOSIS — M4696 Unspecified inflammatory spondylopathy, lumbar region: Secondary | ICD-10-CM

## 2016-12-04 DIAGNOSIS — F5104 Psychophysiologic insomnia: Secondary | ICD-10-CM | POA: Insufficient documentation

## 2016-12-04 DIAGNOSIS — G894 Chronic pain syndrome: Secondary | ICD-10-CM | POA: Diagnosis not present

## 2016-12-04 DIAGNOSIS — E785 Hyperlipidemia, unspecified: Secondary | ICD-10-CM | POA: Insufficient documentation

## 2016-12-04 DIAGNOSIS — M25562 Pain in left knee: Secondary | ICD-10-CM | POA: Diagnosis not present

## 2016-12-04 DIAGNOSIS — M79605 Pain in left leg: Secondary | ICD-10-CM | POA: Diagnosis not present

## 2016-12-04 DIAGNOSIS — Z888 Allergy status to other drugs, medicaments and biological substances status: Secondary | ICD-10-CM | POA: Diagnosis not present

## 2016-12-04 DIAGNOSIS — K219 Gastro-esophageal reflux disease without esophagitis: Secondary | ICD-10-CM | POA: Insufficient documentation

## 2016-12-04 DIAGNOSIS — M7918 Myalgia, other site: Secondary | ICD-10-CM

## 2016-12-04 DIAGNOSIS — M810 Age-related osteoporosis without current pathological fracture: Secondary | ICD-10-CM | POA: Insufficient documentation

## 2016-12-04 DIAGNOSIS — G8929 Other chronic pain: Secondary | ICD-10-CM | POA: Diagnosis not present

## 2016-12-04 DIAGNOSIS — E559 Vitamin D deficiency, unspecified: Secondary | ICD-10-CM | POA: Diagnosis not present

## 2016-12-04 DIAGNOSIS — M25552 Pain in left hip: Secondary | ICD-10-CM | POA: Insufficient documentation

## 2016-12-04 DIAGNOSIS — R252 Cramp and spasm: Secondary | ICD-10-CM | POA: Diagnosis not present

## 2016-12-04 DIAGNOSIS — G4730 Sleep apnea, unspecified: Secondary | ICD-10-CM | POA: Insufficient documentation

## 2016-12-04 DIAGNOSIS — M488X6 Other specified spondylopathies, lumbar region: Secondary | ICD-10-CM | POA: Insufficient documentation

## 2016-12-04 DIAGNOSIS — M792 Neuralgia and neuritis, unspecified: Secondary | ICD-10-CM

## 2016-12-04 DIAGNOSIS — Z8379 Family history of other diseases of the digestive system: Secondary | ICD-10-CM | POA: Diagnosis not present

## 2016-12-04 DIAGNOSIS — G8918 Other acute postprocedural pain: Secondary | ICD-10-CM | POA: Insufficient documentation

## 2016-12-04 DIAGNOSIS — J449 Chronic obstructive pulmonary disease, unspecified: Secondary | ICD-10-CM | POA: Diagnosis not present

## 2016-12-04 DIAGNOSIS — R209 Unspecified disturbances of skin sensation: Secondary | ICD-10-CM | POA: Insufficient documentation

## 2016-12-04 DIAGNOSIS — F17210 Nicotine dependence, cigarettes, uncomplicated: Secondary | ICD-10-CM | POA: Diagnosis not present

## 2016-12-04 DIAGNOSIS — F449 Dissociative and conversion disorder, unspecified: Secondary | ICD-10-CM | POA: Insufficient documentation

## 2016-12-04 DIAGNOSIS — E46 Unspecified protein-calorie malnutrition: Secondary | ICD-10-CM | POA: Insufficient documentation

## 2016-12-04 DIAGNOSIS — M533 Sacrococcygeal disorders, not elsewhere classified: Secondary | ICD-10-CM | POA: Insufficient documentation

## 2016-12-04 DIAGNOSIS — Z79899 Other long term (current) drug therapy: Secondary | ICD-10-CM | POA: Diagnosis not present

## 2016-12-04 DIAGNOSIS — M25559 Pain in unspecified hip: Secondary | ICD-10-CM | POA: Diagnosis not present

## 2016-12-04 DIAGNOSIS — M47816 Spondylosis without myelopathy or radiculopathy, lumbar region: Secondary | ICD-10-CM

## 2016-12-04 DIAGNOSIS — Z79891 Long term (current) use of opiate analgesic: Secondary | ICD-10-CM | POA: Insufficient documentation

## 2016-12-04 DIAGNOSIS — I493 Ventricular premature depolarization: Secondary | ICD-10-CM | POA: Insufficient documentation

## 2016-12-04 DIAGNOSIS — Z5181 Encounter for therapeutic drug level monitoring: Secondary | ICD-10-CM | POA: Insufficient documentation

## 2016-12-04 DIAGNOSIS — Z681 Body mass index (BMI) 19 or less, adult: Secondary | ICD-10-CM | POA: Insufficient documentation

## 2016-12-04 MED ORDER — GABAPENTIN 100 MG PO CAPS: 100.0000 mg | ORAL_CAPSULE | Freq: Four times a day (QID) | ORAL | 2 refills | Status: DC

## 2016-12-04 MED ORDER — CYCLOBENZAPRINE HCL 10 MG PO TABS: 10.0000 mg | ORAL_TABLET | Freq: Three times a day (TID) | ORAL | 2 refills | Status: DC | PRN
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
## 2016-12-04 NOTE — Patient Instructions (Signed)
____________________________________________________________________________________________  Gabapentin Titration  Medication used: Gabapentin (Generic Name) or Neurontin (Brand Name) 100 mg tablets/capsules  Reasons to stop increasing the dose:  Reason 1: You get good relief of symptoms, in which case there is no need to increase the daily dose any further.    Reason 2: You develop some side effects, such as sleeping all of the time, difficulty concentrating, or becoming disoriented, in which case you need to go down on the dose, to the prior level, where you were not experiencing any side effects. Stay on that dose longer, to allow more time for your body to get use it, before attempting to increase it again.   Steps to increase medication: Step 1: Start by taking 1 (one) tablet at bedtime x 7 (seven) days.  Step 2: Increase dose to 2 (two) tablets at bedtime. Stay on this dose x 7 (seven) days.  Step 3: Next increase it to 3 (three) tablets at bedtime. Stay on this dose x another 7 (seven) days.  Step 4: Next, add 1 (one) tablet at noon with lunch. Continue this dose x another 7 (seven) days.  Step 5: Add 1 (one) tablet in the afternoon with dinner. Stay on this dose x another 7 (seven) days.  Step 6: At this point you should be taking the medicine 4 (four) times a day. This daily regimen of taking the medicine 4 (four) times a day, will be maintained from now on. You should not take any doses any sooner than every 6 (six) hours.  Step 7: After 7 (seven) days of taking 3 (three) tablet at bedtime, 1 (one) tablet at noon, 1 (one) tablet in the afternoon, and 1 (one) tablet in the morning, begin taking 2 (two) tablets at noon with lunch. Stay on this dose x another 7 (seven) days.   Step 8: After 7 (seven) days of taking 3 (three) tablet at bedtime, 2 (two) tablets at noon, 1 (one) tablet in the afternoon, and 1 (one) tablet in the morning, begin taking 2 (two) tablets in the afternoon  with dinner. Stay on this dose x another 7 (seven) days.   Step 9: After 7 (seven) days of taking 3 (three) tablet at bedtime, 2 (two) tablets at noon, 2 (two) tablets in the afternoon, and 1 (one) tablet in the morning, begin taking 2 (two) tablets in the morning with breakfast. Stay on this dose x another 7 (seven) days. At this point you should be taking the medicine 4 (four) times a day, or about every 6 (six) hours. This daily regimen of taking the medicine 4 (four) times a day, will be maintained from now on. You should not take any doses any sooner than every 6 (six) hours.  Step 10: After 7 (seven) days of taking 3 (three) tablet at bedtime, 2 (two) tablets at noon, 2 (two) tablets in the afternoon, and 2 (two) tablets in the morning, begin taking 3 (three) tablets at noon with lunch. Stay on this dose x another 7 (seven) days.   Step 11: After 7 (seven) days of taking 3 (three) tablet at bedtime, 3 (three) tablets at noon, 2 (two) tablets in the afternoon, and 2 (two) tablets in the morning, begin taking 3 (three) tablets in the afternoon with dinner. Stay on this dose x another 7 (seven) days.   Step 12: After 7 (seven) days of taking 3 (three) tablet at bedtime, 3 (three) tablets at noon, 3 (three) tablets in the afternoon, and 2 (two)  tablet in the morning, begin taking 3 (three) tablets in the morning with breakfast. Stay on this dose x another 7 (seven) days. At this point you should be taking the medicine 4 (four) times a day, or about every 6 (six) hours. This daily regimen of taking the medicine 4 (four) times a day, will be maintained from now on.   Endpoint: Once you have reached the maximum dose you can tolerate without side-effects, contact your physician so as to evaluate the results of the regimen.   Questions: Feel free to contact us for any questions or problems at [phone] ____________________________________________________________________________________________  

## 2016-12-04 NOTE — Progress Notes (Signed)
Safety precautions to be maintained throughout the outpatient stay will include: orient to surroundings, keep bed in low position, maintain call bell within reach at all times, provide assistance with transfer out of bed and ambulation.  

## 2016-12-05 ENCOUNTER — Ambulatory Visit: Payer: Medicare Other | Admitting: Pain Medicine

## 2016-12-16 ENCOUNTER — Ambulatory Visit: Payer: Medicare Other | Attending: Nurse Practitioner | Admitting: Nurse Practitioner

## 2016-12-16 ENCOUNTER — Encounter: Payer: Self-pay | Admitting: Nurse Practitioner

## 2016-12-16 VITALS — BP 95/53 | HR 109 | Temp 98.1°F | Resp 16 | Ht 64.0 in | Wt 105.0 lb

## 2016-12-16 DIAGNOSIS — M810 Age-related osteoporosis without current pathological fracture: Secondary | ICD-10-CM | POA: Diagnosis not present

## 2016-12-16 DIAGNOSIS — M961 Postlaminectomy syndrome, not elsewhere classified: Secondary | ICD-10-CM | POA: Diagnosis not present

## 2016-12-16 DIAGNOSIS — M792 Neuralgia and neuritis, unspecified: Secondary | ICD-10-CM

## 2016-12-16 DIAGNOSIS — M25551 Pain in right hip: Secondary | ICD-10-CM | POA: Diagnosis not present

## 2016-12-16 DIAGNOSIS — Z79899 Other long term (current) drug therapy: Secondary | ICD-10-CM | POA: Insufficient documentation

## 2016-12-16 DIAGNOSIS — G8929 Other chronic pain: Secondary | ICD-10-CM

## 2016-12-16 DIAGNOSIS — I10 Essential (primary) hypertension: Secondary | ICD-10-CM | POA: Insufficient documentation

## 2016-12-16 DIAGNOSIS — Z681 Body mass index (BMI) 19 or less, adult: Secondary | ICD-10-CM | POA: Diagnosis not present

## 2016-12-16 DIAGNOSIS — G4701 Insomnia due to medical condition: Secondary | ICD-10-CM | POA: Diagnosis not present

## 2016-12-16 DIAGNOSIS — M25552 Pain in left hip: Secondary | ICD-10-CM | POA: Diagnosis not present

## 2016-12-16 DIAGNOSIS — R252 Cramp and spasm: Secondary | ICD-10-CM

## 2016-12-16 DIAGNOSIS — M47896 Other spondylosis, lumbar region: Secondary | ICD-10-CM | POA: Diagnosis not present

## 2016-12-16 DIAGNOSIS — M47816 Spondylosis without myelopathy or radiculopathy, lumbar region: Secondary | ICD-10-CM | POA: Diagnosis not present

## 2016-12-16 DIAGNOSIS — Z79891 Long term (current) use of opiate analgesic: Secondary | ICD-10-CM | POA: Diagnosis not present

## 2016-12-16 DIAGNOSIS — G4700 Insomnia, unspecified: Secondary | ICD-10-CM | POA: Diagnosis not present

## 2016-12-16 DIAGNOSIS — E559 Vitamin D deficiency, unspecified: Secondary | ICD-10-CM | POA: Diagnosis not present

## 2016-12-16 DIAGNOSIS — J449 Chronic obstructive pulmonary disease, unspecified: Secondary | ICD-10-CM | POA: Insufficient documentation

## 2016-12-16 DIAGNOSIS — M545 Low back pain: Secondary | ICD-10-CM | POA: Insufficient documentation

## 2016-12-16 DIAGNOSIS — E46 Unspecified protein-calorie malnutrition: Secondary | ICD-10-CM | POA: Insufficient documentation

## 2016-12-16 DIAGNOSIS — K219 Gastro-esophageal reflux disease without esophagitis: Secondary | ICD-10-CM | POA: Diagnosis not present

## 2016-12-16 DIAGNOSIS — F17210 Nicotine dependence, cigarettes, uncomplicated: Secondary | ICD-10-CM | POA: Insufficient documentation

## 2016-12-16 DIAGNOSIS — G894 Chronic pain syndrome: Secondary | ICD-10-CM

## 2016-12-16 DIAGNOSIS — Z886 Allergy status to analgesic agent status: Secondary | ICD-10-CM | POA: Insufficient documentation

## 2016-12-16 DIAGNOSIS — I493 Ventricular premature depolarization: Secondary | ICD-10-CM | POA: Diagnosis not present

## 2016-12-16 DIAGNOSIS — M4696 Unspecified inflammatory spondylopathy, lumbar region: Secondary | ICD-10-CM

## 2016-12-16 DIAGNOSIS — G629 Polyneuropathy, unspecified: Secondary | ICD-10-CM | POA: Diagnosis not present

## 2016-12-16 DIAGNOSIS — M791 Myalgia: Secondary | ICD-10-CM

## 2016-12-16 DIAGNOSIS — M7918 Myalgia, other site: Secondary | ICD-10-CM

## 2016-12-16 DIAGNOSIS — E785 Hyperlipidemia, unspecified: Secondary | ICD-10-CM | POA: Insufficient documentation

## 2016-12-16 MED ORDER — TRAZODONE HCL 100 MG PO TABS: 100.0000 mg | ORAL_TABLET | Freq: Every day | ORAL | 0 refills | Status: DC

## 2016-12-16 MED ORDER — CYCLOBENZAPRINE HCL 10 MG PO TABS: 10.0000 mg | ORAL_TABLET | Freq: Three times a day (TID) | ORAL | 2 refills | Status: DC | PRN

## 2016-12-16 MED ORDER — HYDROCODONE-ACETAMINOPHEN 10-325 MG PO TABS: 1.0000 | ORAL_TABLET | Freq: Two times a day (BID) | ORAL | 0 refills | Status: DC

## 2016-12-16 NOTE — Progress Notes (Signed)
Nursing Pain Medication Assessment:  Safety precautions to be maintained throughout the outpatient stay will include: orient to surroundings, keep bed in low position, maintain call bell within reach at all times, provide assistance with transfer out of bed and ambulation.  Medication Inspection Compliance: Paula Duncan did not comply with our request to bring her pills to be counted. She was reminded that bringing the medication bottles, even when empty, is a requirement.  Medication: None brought in. Pill/Patch Count: None available to be counted. Bottle Appearance: No container available. Did not bring bottle(s) to appointment. Filled Date: N/A Last Medication intake:  Today

## 2016-12-16 NOTE — Patient Instructions (Addendum)
____________________________________________________________________________________________You have been given 3 prescriptions for Hydrocodone.  You have been  instructecd to get labs drawn today.   You have prescriptions that have been sent to the pharmacy to be picked up,     Medication Rules  Applies to: All patients receiving prescriptions (written or electronic).  Pharmacy of record: Pharmacy where electronic prescriptions will be sent. If written prescriptions are taken to a different pharmacy, please inform the nursing staff. The pharmacy listed in the electronic medical record should be the one where you would like electronic prescriptions to be sent.  Prescription refills: Only during scheduled appointments. Applies to both, written and electronic prescriptions.  NOTE: The following applies primarily to controlled substances (Opioid* Pain Medications).   Patient's responsibilities: 1. Pain Pills: Bring all pain pills to every appointment (except for procedure appointments). 2. Pill Bottles: Bring pills in original pharmacy bottle. Always bring newest bottle. Bring bottle, even if empty. 3. Medication refills: You are responsible for knowing and keeping track of what medications you need refilled. The day before your appointment, write a list of all prescriptions that need to be refilled. Bring that list to your appointment and give it to the admitting nurse. Prescriptions will be written only during appointments. If you forget a medication, it will not be "Called in", "Faxed", or "electronically sent". You will need to get another appointment to get these prescribed. 4. Prescription Accuracy: You are responsible for carefully inspecting your prescriptions before leaving our office. Have the discharge nurse carefully go over each prescription with you, before taking them home. Make sure that your name is accurately spelled, that your address is correct. Check the name and dose of your  medication to make sure it is accurate. Check the number of pills, and the written instructions to make sure they are clear and accurate. Make sure that you are given enough medication to last until your next medication refill appointment. 5. Taking Medication: Take medication as prescribed. Never take more pills than instructed. Never take medication more frequently than prescribed. Taking less pills or less frequently is permitted and encouraged, when it comes to controlled substances (written prescriptions).  6. Inform other Doctors: Always inform, all of your healthcare providers, of all the medications you take. 7. Pain Medication from other Providers: You are not allowed to accept any additional pain medication from any other Doctor or Healthcare provider. There are two exceptions to this rule. (see below) In the event that you require additional pain medication, you are responsible for notifying us, as stated below. 8. Medication Agreement: You are responsible for carefully reading and following our Medication Agreement. This must be signed before receiving any prescriptions from our practice. Safely store a copy of your signed Agreement. Violations to the Agreement will result in no further prescriptions. (Additional copies of our Medication Agreement are available upon request.) 9. Laws, Rules, & Regulations: All patients are expected to follow all Federal and [employer], Rules, [employer]. Ignorance of the Laws does not constitute a valid excuse. The use of any illegal substances is prohibited. 10. Adopted CDC guidelines & recommendations: Target dosing levels will be at or below 60 MME/day. Use of benzodiazepines** is not recommended.  Exceptions: There are only two exceptions to the rule of not receiving pain medications from other Healthcare Providers. 1. Exception #1 (Emergencies): In the event of an emergency (i.e.: accident requiring emergency care), you are allowed to receive  additional pain medication. However, you are responsible for: As soon as you  are able, call our office [phone], at any time of the day or night, and leave a message stating your name, the date and nature of the emergency, and the name and dose of the medication prescribed. In the event that your call is answered by a member of our staff, make sure to document and save the date, time, and the name of the person that took your information.  2. Exception #2 (Planned Surgery): In the event that you are scheduled by another doctor or dentist to have any type of surgery or procedure, you are allowed (for a period no longer than 30 days), to receive additional pain medication, for the acute post-op pain. However, in this case, you are responsible for picking up a copy of our "Post-op Pain Management for Surgeons" handout, and giving it to your surgeon or dentist. This document is available at our office, and does not require an appointment to obtain it. Simply go to our office during business hours (Monday-Thursday from 8:00 AM to 4:00 PM) (Friday 8:00 AM to 12:00 Noon) or if you have a scheduled appointment with [place], prior to your surgery, and ask for it by name. In addition, you will need to provide [place] with your name, name of your surgeon, type of surgery, and date of procedure or surgery.  *Opioid medications include: morphine, codeine, oxycodone, oxymorphone, hydrocodone, hydromorphone, meperidine, tramadol, tapentadol, buprenorphine, fentanyl, methadone. **Benzodiazepine medications include: diazepam (Valium), alprazolam (Xanax), clonazepam (Klonopine), lorazepam (Ativan), clorazepate (Tranxene), chlordiazepoxide (Librium), estazolam (Prosom), oxazepam (Serax), temazepam (Restoril), triazolam (Halcion)  ____________________________________________________________________________________________ BMI Assessment: Estimated body mass index is 18.02 kg/m as calculated from the following:   Height as of this  encounter: 5\' 4"  (1.626 m).   Weight as of this encounter: 105 lb (47.6 kg).  BMI interpretation table: BMI level Category Range association with higher incidence of chronic pain  <18 kg/m2 Underweight   18.5-24.9 kg/m2 Ideal body weight   25-29.9 kg/m2 Overweight Increased incidence by 20%  30-34.9 kg/m2 Obese (Class I) Increased incidence by 68%  35-39.9 kg/m2 Severe obesity (Class II) Increased incidence by 136%  >40 kg/m2 Extreme obesity (Class III) Increased incidence by 254%   BMI Readings from Last 4 Encounters:  12/16/16 18.02 kg/m  12/04/16 18.02 kg/m  10/23/16 18.71 kg/m  10/10/16 19.05 kg/m   Wt Readings from Last 4 Encounters:  12/16/16 105 lb (47.6 kg)  12/04/16 105 lb (47.6 kg)  10/23/16 109 lb (49.4 kg)  10/10/16 111 lb (50.3 kg)

## 2016-12-16 NOTE — Progress Notes (Signed)
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 (written or electronic).  Pharmacy of record: Pharmacy where electronic prescriptions will be sent. If written prescriptions are taken to a different pharmacy, please inform the nursing staff. The pharmacy listed in the electronic medical record should be the one where you would like electronic prescriptions to be sent.  Prescription refills: Only during scheduled appointments. Applies to both, written and electronic prescriptions.  NOTE: The following applies primarily to controlled substances (Opioid* Pain Medications).   Patient's responsibilities: 1. Pain Pills: Bring all pain pills to every appointment (except for procedure appointments). 2. Pill Bottles: Bring pills in original pharmacy bottle. Always bring newest bottle. Bring bottle, even if empty. 3. Medication refills: You are responsible for knowing and keeping  track of what medications you need refilled. The day before your appointment, write a list of all prescriptions that need to be refilled. Bring that list to your appointment and give it to the admitting nurse. Prescriptions will be written only during appointments. If you forget a medication, it will not be "Called in", "Faxed", or "electronically sent". You will need to get another appointment to get these prescribed. 4. Prescription Accuracy: You are responsible for carefully inspecting your prescriptions before leaving our office. Have the discharge nurse carefully go over each prescription with  you, before taking them home. Make sure that your name is accurately spelled, that your address is correct. Check the name and dose of your medication to make sure it is accurate. Check the number of pills, and the written instructions to make sure they are clear and accurate. Make sure that you are given enough medication to last until your next medication refill appointment. 5. Taking Medication: Take medication as prescribed. Never take more pills than instructed. Never take medication more frequently than prescribed. Taking less pills or less frequently is permitted and encouraged, when it comes to controlled substances (written prescriptions).  6. Inform other Doctors: Always inform, all of your healthcare providers, of all the medications you take. 7. Pain Medication from other Providers: You are not allowed to accept any additional pain medication from any other Doctor or Healthcare provider. There are two exceptions to this rule. (see below) In the event that you require additional pain medication, you are responsible for notifying us, as stated below. 8. Medication Agreement: You are responsible for carefully reading and following our Medication Agreement. This must be signed before receiving any prescriptions from our practice. Safely store a copy of your signed Agreement. Violations to the Agreement will  result in no further prescriptions. (Additional copies of our Medication Agreement are available upon request.) 9. Laws, Rules, & Regulations: All patients are expected to follow all Federal and [employer], [employer], Rules, [employer]. Ignorance of the Laws does not constitute a valid excuse. The use of any illegal substances is prohibited. 10. Adopted CDC guidelines & recommendations: Target dosing levels will be at or below 60 MME/day. Use of benzodiazepines** is not recommended.  Exceptions: There are only two exceptions to the rule of not receiving pain medications from other Healthcare Providers. 1. Exception #1 (Emergencies): In the event of an emergency (i.e.: accident requiring emergency care), you are allowed to receive additional pain medication. However, you are responsible for: As soon as you are able, call our office [phone], at any time of the day or night, and leave a message stating your name, the date and nature of the emergency, and the name and dose of the medication prescribed. In the event that your call is answered by a member of our staff, make sure to document and save the date, time, and the name of the person that took your information.  2. Exception #2 (Planned Surgery): In the event that you are scheduled by another doctor or dentist to have any type of surgery or procedure, you are allowed (for a period no longer than 30 days), to receive additional pain medication, for the acute post-op pain. However, in this case, you are responsible for picking up a copy of our "Post-op Pain Management for Surgeons" handout, and giving it to your surgeon or dentist. This document is available at our office, and does not require an appointment to obtain it. Simply go to our office during business hours (Monday-Thursday from 8:00 AM to 4:00 PM) (Friday 8:00 AM to 12:00 Noon) or if you have a scheduled appointment with [place], prior to your surgery, and ask for it by name. In addition, you  will need to provide [place] with your name, name of your surgeon, type of surgery, and date of procedure or surgery.  *Opioid medications include: morphine, codeine, oxycodone, oxymorphone, hydrocodone, hydromorphone, meperidine, tramadol, tapentadol, buprenorphine, fentanyl, methadone. **Benzodiazepine medications include: diazepam (Valium), alprazolam (Xanax), clonazepam (Klonopine), lorazepam (Ativan), clorazepate (Tranxene), chlordiazepoxide (Librium), estazolam (Prosom), oxazepam (Serax), temazepam (Restoril),  triazolam (Halcion)  ____________________________________________________________________________________________ BMI Assessment: Estimated body mass index is 18.02 kg/m as calculated from the following:   Height as of this encounter: 5' 4" (1.626 m).   Weight as of this encounter: 105 lb (47.6 kg).  BMI interpretation table: BMI level Category Range association with higher incidence of chronic pain  <18 kg/m2 Underweight   18.5-24.9 kg/m2 Ideal body weight   25-29.9 kg/m2 Overweight Increased incidence by 20%  30-34.9 kg/m2 Obese (Class I) Increased incidence by 68%  35-39.9 kg/m2 Severe obesity (Class II) Increased incidence by 136%  >40 kg/m2 Extreme obesity (Class III) Increased incidence by 254%   BMI Readings from Last 4 Encounters:  12/16/16 18.02 kg/m  12/04/16 18.02 kg/m  10/23/16 18.71 kg/m  10/10/16 19.05 kg/m   Wt Readings from Last 4 Encounters:  12/16/16 105 lb (47.6 kg)  12/04/16 105 lb (47.6 kg)  10/23/16 109 lb (49.4 kg)  10/10/16 111 lb (50.3 kg)

## 2016-12-17 LAB — COMPREHENSIVE METABOLIC PANEL
ALT: 9 IU/L (ref 0–32)
AST: 21 IU/L (ref 0–40)
Albumin/Globulin Ratio: 2 (ref 1.2–2.2)
Albumin: 4.3 g/dL (ref 3.5–5.5)
Alkaline Phosphatase: 75 IU/L (ref 39–117)
BUN/Creatinine Ratio: 8 — ABNORMAL LOW (ref 9–23)
BUN: 7 mg/dL (ref 6–24)
Bilirubin Total: <0.2 mg/dL (ref 0.0–1.2)
CALCIUM: 9.1 mg/dL (ref 8.7–10.2)
CO2: 22 mmol/L (ref 20–29)
CREATININE: 0.93 mg/dL (ref 0.57–1.00)
Chloride: 105 mmol/L (ref 96–106)
GFR, EST AFRICAN AMERICAN: 78 mL/min/{1.73_m2} (ref >59)
GFR, EST NON AFRICAN AMERICAN: 68 mL/min/{1.73_m2} (ref >59)
GLOBULIN, TOTAL: 2.1 g/dL (ref 1.5–4.5)
Glucose: 72 mg/dL (ref 65–99)
Potassium: 4.5 mmol/L (ref 3.5–5.2)
Sodium: 141 mmol/L (ref 134–144)
TOTAL PROTEIN: 6.4 g/dL (ref 6.0–8.5)

## 2016-12-17 LAB — C-REACTIVE PROTEIN: CRP: 2.7 mg/L (ref 0.0–4.9)

## 2016-12-17 LAB — MAGNESIUM: MAGNESIUM: 2.1 mg/dL (ref 1.6–2.3)

## 2016-12-17 LAB — VITAMIN B12: Vitamin B-12: 577 pg/mL (ref 232–1245)

## 2016-12-17 LAB — SEDIMENTATION RATE: SED RATE: 2 mm/h (ref 0–40)

## 2016-12-19 LAB — TOXASSURE SELECT 13 (MW), URINE

## 2016-12-23 ENCOUNTER — Telehealth: Payer: Self-pay

## 2016-12-23 NOTE — Telephone Encounter (Signed)
Paula Duncan put her back on hydrocodone and two is not lasting her throughout the day. She wants to know if she can take three. Please call her and let her know.

## 2016-12-23 NOTE — Telephone Encounter (Signed)
Called no answer home phone, call cell phone and left message to get more information about her pain. Patient to call us back

## 2016-12-23 NOTE — Telephone Encounter (Signed)
Talked with patient. She states that the Hydrocodone are not working for her and taking the pain away. Paula Duncan said that the medicine only helps for about 4-5 hours and would like to have it increased to 3 times a day if possible.Please advise. If medication is increased then the prescriptions will not allow enough pills for the month.

## 2016-12-23 NOTE — Telephone Encounter (Signed)
Talked with Paula Duncan told her that the reason she was not changed at her last appt was that she did not bring medication with her. Instructed her that she will need to bring in all her Narc medication and ALL unused prescriptions. She will then be evaluated and will discuss plan of care

## 2016-12-23 NOTE — Telephone Encounter (Signed)
Called patient back and she states that she has tried all procedures and states that at her  Last appt dr Dossie Arbour told her that he wanted to change her to Oxycodone instead of Hydrocodone because of the Tylenol. She is currently increasing her Gabapentin. Offered her to come in for a appt to talk about her pain. Paula Duncan is having lower back pain that is radiating to her left buttocks and down back of leg to ankle.

## 2016-12-24 ENCOUNTER — Telehealth: Payer: Self-pay | Admitting: *Deleted

## 2016-12-24 NOTE — Telephone Encounter (Signed)
Lab results read to patient. 

## 2016-12-26 DIAGNOSIS — B029 Zoster without complications: Secondary | ICD-10-CM | POA: Diagnosis not present

## 2016-12-26 DIAGNOSIS — R5383 Other fatigue: Secondary | ICD-10-CM | POA: Diagnosis not present

## 2016-12-26 DIAGNOSIS — G4700 Insomnia, unspecified: Secondary | ICD-10-CM | POA: Diagnosis not present

## 2016-12-26 DIAGNOSIS — Z7689 Persons encountering health services in other specified circumstances: Secondary | ICD-10-CM | POA: Diagnosis not present

## 2016-12-26 DIAGNOSIS — Z79899 Other long term (current) drug therapy: Secondary | ICD-10-CM | POA: Diagnosis not present

## 2016-12-26 DIAGNOSIS — L299 Pruritus, unspecified: Secondary | ICD-10-CM | POA: Diagnosis not present

## 2017-01-22 ENCOUNTER — Ambulatory Visit: Payer: Medicare Other | Admitting: Pulmonary Disease

## 2017-01-22 ENCOUNTER — Ambulatory Visit: Payer: Medicare Other | Attending: Pain Medicine | Admitting: Pain Medicine

## 2017-01-22 ENCOUNTER — Encounter: Payer: Self-pay | Admitting: Pain Medicine

## 2017-01-22 VITALS — BP 121/51 | HR 116 | Temp 98.4°F | Resp 16 | Ht 64.0 in | Wt 112.0 lb

## 2017-01-22 DIAGNOSIS — I10 Essential (primary) hypertension: Secondary | ICD-10-CM | POA: Diagnosis not present

## 2017-01-22 DIAGNOSIS — M533 Sacrococcygeal disorders, not elsewhere classified: Secondary | ICD-10-CM | POA: Diagnosis not present

## 2017-01-22 DIAGNOSIS — Z809 Family history of malignant neoplasm, unspecified: Secondary | ICD-10-CM | POA: Diagnosis not present

## 2017-01-22 DIAGNOSIS — Z79891 Long term (current) use of opiate analgesic: Secondary | ICD-10-CM | POA: Diagnosis not present

## 2017-01-22 DIAGNOSIS — G8929 Other chronic pain: Secondary | ICD-10-CM

## 2017-01-22 DIAGNOSIS — M4696 Unspecified inflammatory spondylopathy, lumbar region: Secondary | ICD-10-CM

## 2017-01-22 DIAGNOSIS — Z90710 Acquired absence of both cervix and uterus: Secondary | ICD-10-CM | POA: Diagnosis not present

## 2017-01-22 DIAGNOSIS — Z681 Body mass index (BMI) 19 or less, adult: Secondary | ICD-10-CM | POA: Diagnosis not present

## 2017-01-22 DIAGNOSIS — G894 Chronic pain syndrome: Secondary | ICD-10-CM

## 2017-01-22 DIAGNOSIS — E785 Hyperlipidemia, unspecified: Secondary | ICD-10-CM | POA: Insufficient documentation

## 2017-01-22 DIAGNOSIS — G8918 Other acute postprocedural pain: Secondary | ICD-10-CM | POA: Diagnosis not present

## 2017-01-22 DIAGNOSIS — M545 Low back pain: Secondary | ICD-10-CM

## 2017-01-22 DIAGNOSIS — Z8371 Family history of colonic polyps: Secondary | ICD-10-CM | POA: Insufficient documentation

## 2017-01-22 DIAGNOSIS — J449 Chronic obstructive pulmonary disease, unspecified: Secondary | ICD-10-CM | POA: Insufficient documentation

## 2017-01-22 DIAGNOSIS — J849 Interstitial pulmonary disease, unspecified: Secondary | ICD-10-CM | POA: Diagnosis not present

## 2017-01-22 DIAGNOSIS — M5126 Other intervertebral disc displacement, lumbar region: Secondary | ICD-10-CM | POA: Insufficient documentation

## 2017-01-22 DIAGNOSIS — Z981 Arthrodesis status: Secondary | ICD-10-CM | POA: Diagnosis not present

## 2017-01-22 DIAGNOSIS — M791 Myalgia: Secondary | ICD-10-CM | POA: Insufficient documentation

## 2017-01-22 DIAGNOSIS — M961 Postlaminectomy syndrome, not elsewhere classified: Secondary | ICD-10-CM | POA: Diagnosis not present

## 2017-01-22 DIAGNOSIS — M47816 Spondylosis without myelopathy or radiculopathy, lumbar region: Secondary | ICD-10-CM | POA: Diagnosis not present

## 2017-01-22 DIAGNOSIS — Z9889 Other specified postprocedural states: Secondary | ICD-10-CM | POA: Diagnosis not present

## 2017-01-22 DIAGNOSIS — E46 Unspecified protein-calorie malnutrition: Secondary | ICD-10-CM | POA: Insufficient documentation

## 2017-01-22 DIAGNOSIS — F17210 Nicotine dependence, cigarettes, uncomplicated: Secondary | ICD-10-CM | POA: Insufficient documentation

## 2017-01-22 DIAGNOSIS — I493 Ventricular premature depolarization: Secondary | ICD-10-CM | POA: Insufficient documentation

## 2017-01-22 MED ORDER — OXYCODONE HCL 5 MG PO TABS: 5.0000 mg | ORAL_TABLET | Freq: Three times a day (TID) | ORAL | 0 refills | Status: DC | PRN

## 2017-01-22 NOTE — Progress Notes (Signed)
Nursing Pain Medication Assessment:  Safety precautions to be maintained throughout the outpatient stay will include: orient to surroundings, keep bed in low position, maintain call bell within reach at all times, provide assistance with transfer out of bed and ambulation.  Medication Inspection Compliance: "Pill count conducted under aseptic conditions, in front of the patient. Neither the pills nor the bottle was removed from the patient's sight at any time. Once count was completed pills were immediately returned to the patient in their original bottle."}  Medication: Hydrocodone/APAP Pill/Patch Count: 0 of 60 pills remain Pill/Patch Appearance: No markings Bottle Appearance: Standard pharmacy container. Clearly labeled. Filled Date:06 /24 / 2018 Last Medication intake:  Today

## 2017-01-22 NOTE — Patient Instructions (Addendum)
_______________You have been given a script for oxycodone x 1 today.  You have been given instructions for pre procedue with sedation.  [facility] was notified to void the 2 scripts for Hydrocodone that were on file.  _____________________________________________________________________________  Preparing for Procedure with Sedation Instructions: . Oral Intake: Do not eat or drink anything for at least 8 hours prior to your procedure. . Transportation: Public transportation is not allowed. Bring an adult driver. The driver must be physically present in our waiting room before any procedure can be started. Marland Kitchen Physical Assistance: Bring an adult physically capable of assisting you, in the event you need help. This adult should keep you company at home for at least 6 hours after the procedure. . Blood Pressure Medicine: Take your blood pressure medicine with a sip of water the morning of the procedure. . Blood thinners:  . Diabetics on insulin: Notify the staff so that you can be scheduled 1st case in the morning. If your diabetes requires high dose insulin, take only  of your normal insulin dose the morning of the procedure and notify the staff that you have done so. . Preventing infections: Shower with an antibacterial soap the morning of your procedure. . Build-up your immune system: Take 1000 mg of Vitamin C with every meal (3 times a day) the day prior to your procedure. Marland Kitchen Antibiotics: Inform the staff if you have a condition or reason that requires you to take antibiotics before dental procedures. . Pregnancy: If you are pregnant, call and cancel the procedure. . Sickness: If you have a cold, fever, or any active infections, call and cancel the procedure. . Arrival: You must be in the facility at least 30 minutes prior to your scheduled procedure. . Children: Do not bring children with you. . Dress appropriately: Bring dark clothing that you would not mind if they get stained. . Valuables:  Do not bring any jewelry or valuables. Procedure appointments are reserved for interventional treatments only. Marland Kitchen No Prescription Refills. . No medication changes will be discussed during procedure appointments. . No disability issues will be discussed. ____________________________________________________________________________________________  Sacroiliac (SI) Joint Injection Patient Information  Description: The sacroiliac joint connects the scrum (very low back and tailbone) to the ilium (a pelvic bone which also forms half of the hip joint).  Normally this joint experiences very little motion.  When this joint becomes inflamed or unstable low back and or hip and pelvis pain may result.  Injection of this joint with local anesthetics (numbing medicines) and steroids can provide diagnostic information and reduce pain.  This injection is performed with the aid of x-ray guidance into the tailbone area while you are lying on your stomach.   You may experience an electrical sensation down the leg while this is being done.  You may also experience numbness.  We also may ask if we are reproducing your normal pain during the injection.  Conditions which may be treated SI injection:   Low back, buttock, hip or leg pain  Preparation for the Injection:  1. Do not eat any solid food or dairy products within 8 hours of your appointment.  2. You may drink clear liquids up to 3 hours before appointment.  Clear liquids include water, black coffee, juice or soda.  No milk or cream please. 3. You may take your regular medications, including pain medications with a sip of water before your appointment.  Diabetics should hold regular insulin (if take separately) and take 1/2 normal NPH dose  the morning of the procedure.  Carry some sugar containing items with you to your appointment. 4. A driver must accompany you and be prepared to drive you home after your procedure. 5. Bring all of your current medications  with you. 6. An IV may be inserted and sedation may be given at the discretion of the physician. 7. A blood pressure cuff, EKG and other monitors will often be applied during the procedure.  Some patients may need to have extra oxygen administered for a short period.  8. You will be asked to provide medical information, including your allergies, prior to the procedure.  We must know immediately if you are taking blood thinners (like Coumadin/Warfarin) or if you are allergic to IV iodine contrast (dye).  We must know if you could possible be pregnant.  Possible side effects:   Bleeding from needle site  Infection (rare, may require surgery)  Nerve injury (rare)  Numbness & tingling (temporary)  A brief convulsion or seizure  Light-headedness (temporary)  Pain at injection site (several days)  Decreased blood pressure (temporary)  Weakness in the leg (temporary)   Call if you experience:   New onset weakness or numbness of an extremity below the injection site that last more than 8 hours.  Hives or difficulty breathing ( go to the emergency room)  Inflammation or drainage at the injection site  Any new symptoms which are concerning to you  Please note:  Although the local anesthetic injected can often make your back/ hip/ buttock/ leg feel good for several hours after the injections, the pain will likely return.  It takes 3-7 days for steroids to work in the sacroiliac area.  You may not notice any pain relief for at least that one week.  If effective, we will often do a series of three injections spaced 3-6 weeks apart to maximally decrease your pain.  After the initial series, we generally will wait some months before a repeat injection of the same type.  If you have any questions, please call [phone] [facility]  Facet Blocks Patient Information  Description: The facets are joints in the spine between the vertebrae.  Like any  joints in the body, facets can become irritated and painful.  Arthritis can also effect the facets.  By injecting steroids and local anesthetic in and around these joints, we can temporarily block the nerve supply to them.  Steroids act directly on irritated nerves and tissues to reduce selling and inflammation which often leads to decreased pain.  Facet blocks may be done anywhere along the spine from the neck to the low back depending upon the location of your pain.   After numbing the skin with local anesthetic (like Novocaine), a small needle is passed onto the facet joints under x-ray guidance.  You may experience a sensation of pressure while this is being done.  The entire block usually lasts about 15-25 minutes.   Conditions which may be treated by facet blocks:   Low back/buttock pain  Neck/shoulder pain  Certain types of headaches  Preparation for the injection:  10. Do not eat any solid food or dairy products within 8 hours of your appointment. 11. You may drink clear liquid up to 3 hours before appointment.  Clear liquids include water, black coffee, juice or soda.  No milk or cream please. 12. You may take your regular medication, including pain medications, with a sip of water before your appointment.  Diabetics should  hold regular insulin (if taken separately) and take 1/2 normal NPH dose the morning of the procedure.  Carry some sugar containing items with you to your appointment. 13. A driver must accompany you and be prepared to drive you home after your procedure. 16. Bring all your current medications with you. 15. An IV may be inserted and sedation may be given at the discretion of the physician. 16. A blood pressure cuff, EKG and other monitors will often be applied during the procedure.  Some patients may need to have extra oxygen administered for a short period. 20. You will be asked to provide medical information, including your allergies and medications, prior to the  procedure.  We must know immediately if you are taking blood thinners (like Coumadin/Warfarin) or if you are allergic to IV iodine contrast (dye).  We must know if you could possible be pregnant.  Possible side-effects:   Bleeding from needle site  Infection (rare, may require surgery)  Nerve injury (rare)  Numbness & tingling (temporary)  Difficulty urinating (rare, temporary)  Spinal headache (a headache worse with upright posture)  Light-headedness (temporary)  Pain at injection site (serveral days)  Decreased blood pressure (rare, temporary)  Weakness in arm/leg (temporary)  Pressure sensation in back/neck (temporary)   Call if you experience:   Fever/chills associated with headache or increased back/neck pain  Headache worsened by an upright position  New onset, weakness or numbness of an extremity below the injection site  Hives or difficulty breathing (go to the emergency room)  Inflammation or drainage at the injection site(s)  Severe back/neck pain greater than usual  New symptoms which are concerning to you  Please note:  Although the local anesthetic injected can often make your back or neck feel good for several hours after the injection, the pain will likely return. It takes 3-7 days for steroids to work.  You may not notice any pain relief for at least one week.  If effective, we will often do a series of 2-3 injections spaced 3-6 weeks apart to maximally decrease your pain.  After the initial series, you may be a candidate for a more permanent nerve block of the facets.  If you have any questions, please call #[phone]) [facility]

## 2017-01-22 NOTE — Progress Notes (Signed)
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              Specialized Test(s): Deferred         Specialized Test(s): Deferred          Thoracic Spine Area Exam  Skin & Axial Inspection: No masses, redness, or swelling Alignment: Symmetrical Functional ROM: Unrestricted ROM Stability: No instability detected Muscle Tone/Strength: Functionally intact. No obvious neuro-muscular anomalies detected. Sensory (Neurological): Unimpaired Muscle strength & Tone: No palpable anomalies  Lumbar Spine Area Exam  Skin & Axial Inspection: No  masses, redness, or swelling Alignment: Symmetrical Functional ROM: Limited ROM      Stability: No instability detected Muscle Tone/Strength: Functionally intact. No obvious neuro-muscular anomalies detected. Sensory (Neurological): Movement-associated pain Palpation: Complains of area being tender to palpation       Provocative Tests: Lumbar Hyperextension and rotation test: Positive  bilaterally for facet joint pain. Lumbar Lateral bending test: evaluation deferred today       Patrick's Maneuver: Positive for bilateral S-I arthralgia              Gait & Posture Assessment  Ambulation: Limited Gait: Relatively normal for age and body habitus Posture: Antalgic   Lower Extremity Exam    Side: Right lower extremity  Side: Left lower extremity  Skin & Extremity Inspection: Skin color, temperature, and hair growth are WNL. No peripheral edema or cyanosis. No masses, redness, swelling, asymmetry, or associated skin lesions. No contractures.  Skin & Extremity Inspection: Skin color, temperature, and hair growth are WNL. No peripheral edema or cyanosis. No masses, redness, swelling, asymmetry, or associated skin lesions. No contractures.  Functional ROM: Unrestricted ROM          Functional ROM: Unrestricted ROM          Muscle Tone/Strength: Functionally intact. No obvious neuro-muscular anomalies detected.  Muscle Tone/Strength: Functionally intact. No obvious neuro-muscular anomalies detected.  Sensory (Neurological): Unimpaired  Sensory (Neurological): Unimpaired  Palpation: No palpable anomalies  Palpation: No palpable anomalies   Assessment  Primary Diagnosis & Pertinent Problem List: The primary encounter diagnosis was Chronic low back pain (Location of Primary Source of Pain) (Bilateral) (R>L). 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  Status Diagnosis  Persistent Persistent Persistent 1. Chronic low back pain (Location of Primary Source of Pain) (Bilateral) (R>L)   2. Lumbar facet syndrome (Location of Primary Source of Pain) (Bilateral) (R>L)   3. 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    Hernia, Incisional  PREMATURE VENTRICULAR CONTRACTIONS   PCI with normal coronary anatomy, no blockage, and normal LV fxn on 04/14/13.  Holter 48 hr monitor 04/2013 normal, followed by [facility]      Plan of Care  Pharmacotherapy (Medications Ordered): Meds ordered this encounter  Medications  . oxyCODONE (OXY IR/ROXICODONE) 5 MG immediate release tablet    Sig: Take 1 tablet (5 mg total) by mouth every 8 (eight) hours as needed for severe pain.    Dispense:  90 tablet    Refill:  0    Do not place this medication, or any other prescription from our practice, on "Automatic Refill". Patient may have prescription filled one day early if pharmacy is closed on scheduled refill date. Do not fill until: 01/22/17 To last until: 02/21/17   New Prescriptions   OXYCODONE (OXY IR/ROXICODONE) 5 MG IMMEDIATE RELEASE TABLET    Take 1 tablet (5 mg total) by mouth every 8 (eight) hours as needed for severe pain.   Medications administered today: Paula Duncan had no medications administered during this visit.   Procedure Orders  LUMBAR FACET(MEDIAL BRANCH NERVE BLOCK) MBNB     SACROILIAC JOINT INJECTINS Lab Orders  No laboratory test(s) ordered today   Imaging Orders  No imaging studies ordered today   Referral Orders  No referral(s) requested today   Interventional management options: Planned, scheduled, and/or pending:   Diagnostic bilateral lumbar facet + SI block under fluoro and IV sedation.   Considering:   Bilateral lumbar facet RFA (Left done 10/23/2016, right on 09/09/2016). Diagnostic bilateral lumbar facet block   Possible palliative bilateral lumbar facet RFA    Palliative PRN treatment(s):   None at this time    Provider-requested follow-up: Return for Procedure (with sedation): (B) L-FCT + SI Blk.  Future Appointments Date Time Provider [place]  01/30/2017 8:15 AM Milinda Pointer, MD [facility] None  02/13/2017 10:30 AM Javier Glazier, MD [facility] None  03/18/2017 8:45 AM Vevelyn Francois, NP [facility] None   Primary Care Physician: [name], FNP Location: [facility] Note by: Gaspar Cola, MD Date: 01/22/2017; Time: 2:51 PM

## 2017-01-30 ENCOUNTER — Ambulatory Visit (HOSPITAL_BASED_OUTPATIENT_CLINIC_OR_DEPARTMENT_OTHER): Payer: Medicare Other | Admitting: Pain Medicine

## 2017-01-30 ENCOUNTER — Encounter: Payer: Self-pay | Admitting: Pain Medicine

## 2017-01-30 ENCOUNTER — Ambulatory Visit
Admission: RE | Admit: 2017-01-30 | Discharge: 2017-01-30 | Disposition: A | Discharge status: Home / Self Care | Payer: Medicare Other | Source: Ambulatory Visit | Attending: Pain Medicine | Admitting: Pain Medicine

## 2017-01-30 VITALS — BP 147/85 | HR 110 | Temp 98.1°F | Resp 12 | Ht 64.0 in | Wt 112.0 lb

## 2017-01-30 DIAGNOSIS — Z886 Allergy status to analgesic agent status: Secondary | ICD-10-CM | POA: Insufficient documentation

## 2017-01-30 DIAGNOSIS — M533 Sacrococcygeal disorders, not elsewhere classified: Secondary | ICD-10-CM | POA: Diagnosis not present

## 2017-01-30 DIAGNOSIS — M4696 Unspecified inflammatory spondylopathy, lumbar region: Secondary | ICD-10-CM | POA: Diagnosis not present

## 2017-01-30 DIAGNOSIS — M545 Low back pain, unspecified: Secondary | ICD-10-CM

## 2017-01-30 DIAGNOSIS — Z888 Allergy status to other drugs, medicaments and biological substances status: Secondary | ICD-10-CM | POA: Diagnosis not present

## 2017-01-30 DIAGNOSIS — Z885 Allergy status to narcotic agent status: Secondary | ICD-10-CM | POA: Diagnosis not present

## 2017-01-30 DIAGNOSIS — G8929 Other chronic pain: Secondary | ICD-10-CM

## 2017-01-30 DIAGNOSIS — M47816 Spondylosis without myelopathy or radiculopathy, lumbar region: Secondary | ICD-10-CM

## 2017-01-30 MED ORDER — ROPIVACAINE HCL 2 MG/ML IJ SOLN
9.0000 mL | Freq: Once | INTRAMUSCULAR | Status: AC
  Administered 2017-01-30: 9 mL via PERINEURAL
  Filled 2017-01-30: qty 10

## 2017-01-30 MED ORDER — FENTANYL CITRATE (PF) 100 MCG/2ML IJ SOLN
25.0000 ug | INTRAMUSCULAR | Status: DC | PRN
  Administered 2017-01-30: 100 ug via INTRAVENOUS
  Filled 2017-01-30: qty 2

## 2017-01-30 MED ORDER — TRIAMCINOLONE ACETONIDE 40 MG/ML IJ SUSP
40.0000 mg | Freq: Once | INTRAMUSCULAR | Status: AC
  Administered 2017-01-30: 40 mg
  Filled 2017-01-30: qty 1

## 2017-01-30 MED ORDER — MIDAZOLAM HCL 5 MG/5ML IJ SOLN
1.0000 mg | INTRAMUSCULAR | Status: DC | PRN
  Administered 2017-01-30: 3 mg via INTRAVENOUS
  Filled 2017-01-30: qty 5

## 2017-01-30 MED ORDER — LACTATED RINGERS IV SOLN
1000.0000 mL | Freq: Once | INTRAVENOUS | Status: AC
  Administered 2017-01-30: 1000 mL via INTRAVENOUS

## 2017-01-30 MED ORDER — LIDOCAINE HCL 2 % IJ SOLN
10.0000 mL | Freq: Once | INTRAMUSCULAR | Status: AC
  Administered 2017-01-30: 400 mg
  Filled 2017-01-30: qty 20

## 2017-01-30 MED ORDER — METHYLPREDNISOLONE ACETATE 80 MG/ML IJ SUSP
80.0000 mg | Freq: Once | INTRAMUSCULAR | Status: AC
  Administered 2017-01-30: 80 mg via INTRA_ARTICULAR
  Filled 2017-01-30: qty 1

## 2017-01-30 MED ORDER — ROPIVACAINE HCL 2 MG/ML IJ SOLN
9.0000 mL | Freq: Once | INTRAMUSCULAR | Status: AC
  Administered 2017-01-30: 9 mL via INTRA_ARTICULAR
  Filled 2017-01-30: qty 10

## 2017-01-30 NOTE — Progress Notes (Signed)
Safety precautions to be maintained throughout the outpatient stay will include: orient to surroundings, keep bed in low position, maintain call bell within reach at all times, provide assistance with transfer out of bed and ambulation.  

## 2017-01-30 NOTE — Progress Notes (Signed)
Patient's Name: Paula Duncan  MRN: [id number]  Referring Provider: Milinda Pointer, MD  DOB: [birth year]  PCP: [name], FNP  DOS: 01/30/2017  Note by: Gaspar Cola, MD  Service setting: Ambulatory outpatient  Specialty: Interventional Pain Management  Patient type: Established  Location: [facility] (AMB) Pain Management Facility  Visit type: Interventional Procedure   Primary Reason for Visit: Interventional Pain Management Treatment. CC: Back Pain (lower)  Procedure:  Anesthesia, Analgesia, Anxiolysis:  Procedure #1: Type: Diagnostic Medial Branch Facet Block Region: Lumbar Level: L2, L3, L4, L5, & S1 Medial Branch Level(s) Laterality: Bilateral  Procedure #2: Type: Diagnostic Sacroiliac Joint Block Region: Posterior Lumbosacral Level: PSIS (Posterior Superior Iliac Spine) Sacroiliac Joint Laterality: Bilateral  Type: Local Anesthesia with Moderate (Conscious) Sedation Local Anesthetic: Lidocaine 1% Route: Intravenous (IV) IV Access: Secured Sedation: Meaningful verbal contact was maintained at all times during the procedure  Indication(s): Analgesia and Anxiety  Indications: 1. Lumbar facet syndrome (Location of Primary Source of Pain) (Bilateral) (R>L)   2. 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 Lumbar spondylosis   4. Chronic low back pain (Location of Primary Source of Pain) (Bilateral) (R>L)    Pain Score: Pre-procedure: 5 /10 Post-procedure: 0-No pain/10  Pre-op Assessment:  Paula Duncan is a 56 y.o. (year old), female patient, seen today for interventional treatment. She  has a past surgical history that includes Spine surgery (1994); Abdominal hysterectomy; Exploratory laparotomy; Cesarean section; Esophagogastroduodenoscopy (07/12/09); and left heart catheterization with coronary angiogram (N/A, 04/06/2013). Paula Duncan has a current medication list which includes the following prescription(s): alendronate, benzonatate, calcium carbonate, vitamin d3,  cyanocobalamin, cyclobenzaprine, guaifenesin-codeine, oxycodone, potassium chloride sa, pravastatin, promethazine, flutter, aerochamber mv, spiriva respimat, trazodone, vitamin c, and zolpidem, and the following Facility-Administered Medications: fentanyl and midazolam. Her primarily concern today is the Back Pain (lower)  Initial Vital Signs: There were no vitals taken for this visit. BMI: Estimated body mass index is 19.22 kg/m as calculated from the following:   Height as of this encounter: 5\' 4"  (1.626 m).   Weight as of this encounter: 112 lb (50.8 kg).  Risk Assessment: Allergies: Reviewed. She is allergic to nsaids; tramadol hcl; tramadol hcl; chantix [varenicline]; and metronidazole.  Allergy Precautions: None required Coagulopathies: Reviewed. None identified.  Blood-thinner therapy: None at this time Active Infection(s): Reviewed. None identified. Paula Duncan is afebrile  Site Confirmation: Paula Duncan was asked to confirm the procedure and laterality before marking the site Procedure checklist: Completed Consent: Before the procedure and under the influence of no sedative(s), amnesic(s), or anxiolytics, the patient was informed of the treatment options, risks and possible complications. To fulfill our ethical and legal obligations, as recommended by the American Medical Association's Code of Ethics, I have informed the patient of my clinical impression; the nature and purpose of the treatment or procedure; the risks, benefits, and possible complications of the intervention; the alternatives, including doing nothing; the risk(s) and benefit(s) of the alternative treatment(s) or procedure(s); and the risk(s) and benefit(s) of doing nothing. The patient was provided information about the general risks and possible complications associated with the procedure. These may include, but are not limited to: failure to achieve desired goals, infection, bleeding, organ or nerve damage, allergic  reactions, paralysis, and death. In addition, the patient was informed of those risks and complications associated to Spine-related procedures, such as failure to decrease pain; infection (i.e.: Meningitis, epidural or intraspinal abscess); bleeding (i.e.: epidural hematoma, subarachnoid hemorrhage, or any other type of intraspinal or peri-dural bleeding);  organ or nerve damage (i.e.: Any type of peripheral nerve, nerve root, or spinal cord injury) with subsequent damage to sensory, motor, and/or autonomic systems, resulting in permanent pain, numbness, and/or weakness of one or several areas of the body; allergic reactions; (i.e.: anaphylactic reaction); and/or death. Furthermore, the patient was informed of those risks and complications associated with the medications. These include, but are not limited to: allergic reactions (i.e.: anaphylactic or anaphylactoid reaction(s)); adrenal axis suppression; blood sugar elevation that in diabetics may result in ketoacidosis or comma; water retention that in patients with history of congestive heart failure may result in shortness of breath, pulmonary edema, and decompensation with resultant heart failure; weight gain; swelling or edema; medication-induced neural toxicity; particulate matter embolism and blood vessel occlusion with resultant organ, and/or nervous system infarction; and/or aseptic necrosis of one or more joints. Finally, the patient was informed that Medicine is not an exact science; therefore, there is also the possibility of unforeseen or unpredictable risks and/or possible complications that may result in a catastrophic outcome. The patient indicated having understood very clearly. We have given the patient no guarantees and we have made no promises. Enough time was given to the patient to ask questions, all of which were answered to the patient's satisfaction. Paula Duncan has indicated that she wanted to continue with the procedure. Attestation: I,  the ordering provider, attest that I have discussed with the patient the benefits, risks, side-effects, alternatives, likelihood of achieving goals, and potential problems during recovery for the procedure that I have provided informed consent. Date: 01/30/2017; Time: 7:23 AM  Pre-Procedure Preparation:  Monitoring: As per clinic protocol. Respiration, ETCO2, SpO2, BP, heart rate and rhythm monitor placed and checked for adequate function Safety Precautions: Patient was assessed for positional comfort and pressure points before starting the procedure. Time-out: I initiated and conducted the "Time-out" before starting the procedure, as per protocol. The patient was asked to participate by confirming the accuracy of the "Time Out" information. Verification of the correct person, site, and procedure were performed and confirmed by me, the nursing staff, and the patient. "Time-out" conducted as per Joint Commission's Universal Protocol (UP.01.01.01). "Time-out" Date & Time: 01/30/2017; 0850 hrs.  Description of Procedure #1 Process:   Time-out: "Time-out" completed before starting procedure, as per protocol. Position: Prone Target Area: For Lumbar Facet blocks, the target is the groove formed by the junction of the transverse process and superior articular process. For the L5 dorsal ramus, the target is the notch between superior articular process and sacral ala. For the S1 dorsal ramus, the target is the superior and lateral edge of the posterior S1 Sacral foramen. Approach: Paramedial approach. Area Prepped: Entire Posterior Lumbosacral Region Prepping solution: ChloraPrep (2% chlorhexidine gluconate and 70% isopropyl alcohol) Safety Precautions: Aspiration looking for blood return was conducted prior to all injections. At no point did we inject any substances, as a needle was being advanced. No attempts were made at seeking any paresthesias. Safe injection practices and needle disposal techniques used.  Medications properly checked for expiration dates. SDV (single dose vial) medications used.  Description of the Procedure: Protocol guidelines were followed. The patient was placed in position over the fluoroscopy table. The target area was identified and the area prepped in the usual manner. Skin desensitized using vapocoolant spray. Skin & deeper tissues infiltrated with local anesthetic. Appropriate amount of time allowed to pass for local anesthetics to take effect. The procedure needle was introduced through the skin, ipsilateral to the reported pain, and  advanced to the target area. Employing the "Medial Branch Technique", the needles were advanced to the angle made by the superior and medial portion of the transverse process, and the lateral and inferior portion of the superior articulating process of the targeted vertebral bodies. This area is known as "Burton's Eye" or the "Eye of the [place] Dog". A procedure needle was introduced through the skin, and this time advanced to the angle made by the superior and medial border of the sacral ala, and the lateral border of the S1 vertebral body. This last needle was later repositioned at the superior and lateral border of the posterior S1 foramen. Negative aspiration confirmed. Solution injected in intermittent fashion, asking for systemic symptoms every 0.5cc of injectate. The needles were then removed and the area cleansed, making sure to leave some of the prepping solution back to take advantage of its long term bactericidal properties. Start Time: 0850 hrs. Materials:  Needle(s) Type: Regular needle Gauge: 22G Length: 3.5-in Medication(s): We administered lactated ringers, midazolam, fentaNYL, lidocaine, triamcinolone acetonide, ropivacaine (PF) 2 mg/mL (0.2%), ropivacaine (PF) 2 mg/mL (0.2%), triamcinolone acetonide, methylPREDNISolone acetate, and ropivacaine (PF) 2 mg/mL (0.2%). Please see chart orders for dosing details.  Description of  Procedure # 2 Process:   Position: Prone Target Area: For upper sacroiliac joint block(s), the target is the superior and posterior margin of the sacroiliac joint. Approach: Ipsilateral approach. Area Prepped: Entire Posterior Lumbosacral Region Prepping solution: ChloraPrep (2% chlorhexidine gluconate and 70% isopropyl alcohol) Safety Precautions: Aspiration looking for blood return was conducted prior to all injections. At no point did we inject any substances, as a needle was being advanced. No attempts were made at seeking any paresthesias. Safe injection practices and needle disposal techniques used. Medications properly checked for expiration dates. SDV (single dose vial) medications used. Description of the Procedure: Protocol guidelines were followed. The patient was placed in position over the fluoroscopy table. The target area was identified and the area prepped in the usual manner. Skin desensitized using vapocoolant spray. Skin & deeper tissues infiltrated with local anesthetic. Appropriate amount of time allowed to pass for local anesthetics to take effect. The procedure needle was advanced under fluoroscopic guidance into the sacroiliac joint until a firm endpoint was obtained. Proper needle placement secured. Negative aspiration confirmed. Solution injected in intermittent fashion, asking for systemic symptoms every 0.5cc of injectate. The needles were then removed and the area cleansed, making sure to leave some of the prepping solution back to take advantage of its long term bactericidal properties. Vitals:   01/30/17 0911 01/30/17 0921 01/30/17 0931 01/30/17 0941  BP: 114/64 125/64 123/76 (!) 147/85  Pulse:      Resp: 14 12 18 12   Temp:  97.8 F (36.6 C)  98.1 F (36.7 C)  TempSrc:      SpO2: 96% 97% 97% 100%  Weight:      Height:        End Time: 0911 hrs. Materials:  Needle(s) Type: Regular needle Gauge: 22G Length: 3.5-in Medication(s): We administered lactated ringers,  midazolam, fentaNYL, lidocaine, triamcinolone acetonide, ropivacaine (PF) 2 mg/mL (0.2%), ropivacaine (PF) 2 mg/mL (0.2%), triamcinolone acetonide, methylPREDNISolone acetate, and ropivacaine (PF) 2 mg/mL (0.2%). Please see chart orders for dosing details.  Imaging Guidance (Spinal):  Type of Imaging Technique: Fluoroscopy Guidance (Spinal) Indication(s): Assistance in needle guidance and placement for procedures requiring needle placement in or near specific anatomical locations not easily accessible without such assistance. Exposure Time: Please see nurses notes. Contrast: None used. Fluoroscopic  Guidance: I was personally present during the use of fluoroscopy. "Tunnel Vision Technique" used to obtain the best possible view of the target area. Parallax error corrected before commencing the procedure. "Direction-depth-direction" technique used to introduce the needle under continuous pulsed fluoroscopy. Once target was reached, antero-posterior, oblique, and lateral fluoroscopic projection used confirm needle placement in all planes. Images permanently stored in EMR. Interpretation: No contrast injected. I personally interpreted the imaging intraoperatively. Adequate needle placement confirmed in multiple planes. Permanent images saved into the patient's record.  Antibiotic Prophylaxis:  Indication(s): None identified Antibiotic given: None  Post-operative Assessment:  EBL: None Complications: No immediate post-treatment complications observed by team, or reported by patient. Note: The patient tolerated the entire procedure well. A repeat set of vitals were taken after the procedure and the patient was kept under observation following institutional policy, for this type of procedure. Post-procedural neurological assessment was performed, showing return to baseline, prior to discharge. The patient was provided with post-procedure discharge instructions, including a section on how to identify  potential problems. Should any problems arise concerning this procedure, the patient was given instructions to immediately contact us, at any time, without hesitation. In any case, we plan to contact the patient by telephone for a follow-up status report regarding this interventional procedure. Comments:  No additional relevant information.  Plan of Care  Disposition: Discharge home  Discharge Date & Time: 01/30/2017; 0945 hrs.   Physician-requested Follow-up:  Return for post-procedure eval by Dr. Dossie Arbour in 2 weeks.  Future Appointments Date Time Provider [place]  02/13/2017 10:30 AM Javier Glazier, MD [facility] None  02/17/2017 1:30 PM Milinda Pointer, MD [facility] None  03/18/2017 8:45 AM Vevelyn Francois, NP [facility] None    Imaging Orders     DG C-Arm 1-60 Min-No Report  Procedure Orders     LUMBAR FACET(MEDIAL BRANCH NERVE BLOCK) MBNB     SACROILIAC JOINT INJECTINS  Medications ordered for procedure: Meds ordered this encounter  Medications  . lactated ringers infusion 1,000 mL  . midazolam (VERSED) 5 MG/5ML injection 1-2 mg    Make sure Flumazenil is available in the pyxis when using this medication. If oversedation occurs, administer 0.2 mg IV over 15 sec. If after 45 sec no response, administer 0.2 mg again over 1 min; may repeat at 1 min intervals; not to exceed 4 doses (1 mg)  . fentaNYL (SUBLIMAZE) injection 25-50 mcg    Make sure Narcan is available in the pyxis when using this medication. In the event of respiratory depression (RR< 8/min): Titrate NARCAN (naloxone) in increments of 0.1 to 0.2 mg IV at 2-3 minute intervals, until desired degree of reversal.  . lidocaine (XYLOCAINE) 2 % (with pres) injection 200 mg  . triamcinolone acetonide (KENALOG-40) injection 40 mg  . ropivacaine (PF) 2 mg/mL (0.2%) (NAROPIN) injection 9 mL  . ropivacaine (PF) 2 mg/mL (0.2%) (NAROPIN) injection 9 mL  . triamcinolone acetonide (KENALOG-40) injection 40 mg  .  methylPREDNISolone acetate (DEPO-MEDROL) injection 80 mg  . ropivacaine (PF) 2 mg/mL (0.2%) (NAROPIN) injection 9 mL   Medications administered: We administered lactated ringers, midazolam, fentaNYL, lidocaine, triamcinolone acetonide, ropivacaine (PF) 2 mg/mL (0.2%), ropivacaine (PF) 2 mg/mL (0.2%), triamcinolone acetonide, methylPREDNISolone acetate, and ropivacaine (PF) 2 mg/mL (0.2%).  See the medical record for exact dosing, route, and time of administration.  New Prescriptions   No medications on file   Primary Care Physician: [name], FNP Location: [facility] Note by: Gaspar Cola,  MD Date: 01/30/2017; Time: 10:16 AM  Disclaimer:  Medicine is not an Chief Strategy Officer. The only guarantee in medicine is that nothing is guaranteed. It is important to note that the decision to proceed with this intervention was based on the information collected from the patient. The Data and conclusions were drawn from the patient's questionnaire, the interview, and the physical examination. Because the information was provided in large part by the patient, it cannot be guaranteed that it has not been purposely or unconsciously manipulated. Every effort has been made to obtain as much relevant data as possible for this evaluation. It is important to note that the conclusions that lead to this procedure are derived in large part from the available data. Always take into account that the treatment will also be dependent on availability of resources and existing treatment guidelines, considered by other Pain Management Practitioners as being common knowledge and practice, at the time of the intervention. For Medico-Legal purposes, it is also important to point out that variation in procedural techniques and pharmacological choices are the acceptable norm. The indications, contraindications, technique, and results of the above procedure should only be interpreted and judged  by a Board-Certified Interventional Pain Specialist with extensive familiarity and expertise in the same exact procedure and technique.

## 2017-01-30 NOTE — Patient Instructions (Addendum)
____________________________________________________________________________________________  Post-Procedure instructions Instructions:  Apply ice: Fill a plastic sandwich bag with crushed ice. Cover it with a small towel and apply to injection site. Apply for 15 minutes then remove x 15 minutes. Repeat sequence on day of procedure, until you go to bed. The purpose is to minimize swelling and discomfort after procedure.  Apply heat: Apply heat to procedure site starting the day following the procedure. The purpose is to treat any soreness and discomfort from the procedure.  Food intake: Start with clear liquids (like water) and advance to regular food, as tolerated.   Physical activities: Keep activities to a minimum for the first 8 hours after the procedure.   Driving: If you have received any sedation, you are not allowed to drive for 24 hours after your procedure.  Blood thinner: Restart your blood thinner 6 hours after your procedure. (Only for those taking blood thinners)  Insulin: As soon as you can eat, you may resume your normal dosing schedule. (Only for those taking insulin)  Infection prevention: Keep procedure site clean and dry.  Post-procedure Pain Diary: Extremely important that this be done correctly and accurately. Recorded information will be used to determine the next step in treatment.  Pain evaluated is that of treated area only. Do not include pain from an untreated area.  Complete every hour, on the hour, for the initial 8 hours. Set an alarm to help you do this part accurately.  Do not go to sleep and have it completed later. It will not be accurate.  Follow-up appointment: Keep your follow-up appointment after the procedure. Usually 2 weeks for most procedures. (6 weeks in the case of radiofrequency.) Bring you pain diary.  Expect:  From numbing medicine (AKA: Local Anesthetics): Numbness or decrease in pain.  Onset: Full effect within 15 minutes of  injected.  Duration: It will depend on the type of local anesthetic used. On the average, 1 to 8 hours.   From steroids: Decrease in swelling or inflammation. Once inflammation is improved, relief of the pain will follow.  Onset of benefits: Depends on the amount of swelling present. The more swelling, the longer it will take for the benefits to be seen. In some cases, up to 10 days.  Duration: Steroids will stay in the system x 2 weeks. Duration of benefits will depend on multiple posibilities including persistent irritating factors.  From procedure: Some discomfort is to be expected once the numbing medicine wears off. This should be minimal if ice and heat are applied as instructed. Call if:  You experience numbness and weakness that gets worse with time, as opposed to wearing off.  New onset bowel or bladder incontinence. (Spinal procedures only)  Emergency Numbers:  [facility] business hours (Monday - Thursday, 8:00 AM - 4:00 PM) (Friday, 9:00 AM - 12:00 Noon): [phone]  After hours: [phone] ____________________________________________________________________________________________  Facet Blocks Patient Information  Description: The facets are joints in the spine between the vertebrae.  Like any joints in the body, facets can become irritated and painful.  Arthritis can also effect the facets.  By injecting steroids and local anesthetic in and around these joints, we can temporarily block the nerve supply to them.  Steroids act directly on irritated nerves and tissues to reduce selling and inflammation which often leads to decreased pain.  Facet blocks may be done anywhere along the spine from the neck to the low back depending upon the location of your pain.   After numbing the skin  with local anesthetic (like Novocaine), a small needle is passed onto the facet joints under x-ray guidance.  You may experience a sensation of pressure while this is being done.  The entire  block usually lasts about 15-25 minutes.   Conditions which may be treated by facet blocks:  Low back/buttock pain Neck/shoulder pain Certain types of headaches  Preparation for the injection:  Do not eat any solid food or dairy products within 8 hours of your appointment. You may drink clear liquid up to 3 hours before appointment.  Clear liquids include water, black coffee, juice or soda.  No milk or cream please. You may take your regular medication, including pain medications, with a sip of water before your appointment.  Diabetics should hold regular insulin (if taken separately) and take 1/2 normal NPH dose the morning of the procedure.  Carry some sugar containing items with you to your appointment. A driver must accompany you and be prepared to drive you home after your procedure. Bring all your current medications with you. An IV may be inserted and sedation may be given at the discretion of the physician. A blood pressure cuff, EKG and other monitors will often be applied during the procedure.  Some patients may need to have extra oxygen administered for a short period. You will be asked to provide medical information, including your allergies and medications, prior to the procedure.  We must know immediately if you are taking blood thinners (like Coumadin/Warfarin) or if you are allergic to IV iodine contrast (dye).  We must know if you could possible be pregnant.  Possible side-effects:  Bleeding from needle site Infection (rare, may require surgery) Nerve injury (rare) Numbness & tingling (temporary) Difficulty urinating (rare, temporary) Spinal headache (a headache worse with upright posture) Light-headedness (temporary) Pain at injection site (serveral days) Decreased blood pressure (rare, temporary) Weakness in arm/leg (temporary) Pressure sensation in back/neck (temporary)   Call if you experience:  Fever/chills associated with headache or increased back/neck  pain Headache worsened by an upright position New onset, weakness or numbness of an extremity below the injection site Hives or difficulty breathing (go to the emergency room) Inflammation or drainage at the injection site(s) Severe back/neck pain greater than usual New symptoms which are concerning to you  Please note:  Although the local anesthetic injected can often make your back or neck feel good for several hours after the injection, the pain will likely return. It takes 3-7 days for steroids to work.  You may not notice any pain relief for at least one week.  If effective, we will often do a series of 2-3 injections spaced 3-6 weeks apart to maximally decrease your pain.  After the initial series, you may be a candidate for a more permanent nerve block of the facets.  If you have any questions, please call [phone] [phone]  What are the risk, side effects and possible complications? Generally speaking, most procedures are safe.  However, with any procedure there are risks, side effects, and the possibility of complications.  The risks and complications are dependent upon the sites that are lesioned, or the type of nerve block to be performed.  The closer the procedure is to the spine, the more serious the risks are.  Great care is taken when placing the radio frequency needles, block needles or lesioning probes, but sometimes complications can occur. Infection: Any time there is an injection through the skin, there is  a risk of infection.  This is why sterile conditions are used for these blocks.  There are four possible types of infection. Localized skin infection. Central Nervous System Infection-This can be in the form of Meningitis, which can be deadly. Epidural Infections-This can be in the form of an epidural abscess, which can cause pressure inside of the spine, causing compression of the spinal cord with  subsequent paralysis. This would require an emergency surgery to decompress, and there are no guarantees that the patient would recover from the paralysis. Discitis-This is an infection of the intervertebral discs.  It occurs in about 1% of discography procedures.  It is difficult to treat and it may lead to surgery.        2. Pain: the needles have to go through skin and soft tissues, will cause soreness.       3. Damage to internal structures:  The nerves to be lesioned may be near blood vessels or    other nerves which can be potentially damaged.       4. Bleeding: Bleeding is more common if the patient is taking blood thinners such as  aspirin, Coumadin, Ticiid, Plavix, etc., or if he/she have some genetic predisposition  such as hemophilia. Bleeding into the spinal canal can cause compression of the spinal  cord with subsequent paralysis.  This would require an emergency surgery to  decompress and there are no guarantees that the patient would recover from the  paralysis.       5. Pneumothorax:  Puncturing of a lung is a possibility, every time a needle is introduced in  the area of the chest or upper back.  Pneumothorax refers to free air around the  collapsed lung(s), inside of the thoracic cavity (chest cavity).  Another two possible  complications related to a similar event would include: Hemothorax and Chylothorax.   These are variations of the Pneumothorax, where instead of air around the collapsed  lung(s), you may have blood or chyle, respectively.       6. Spinal headaches: They may occur with any procedures in the area of the spine.       7. Persistent CSF (Cerebro-Spinal Fluid) leakage: This is a rare problem, but may occur  with prolonged intrathecal or epidural catheters either due to the formation of a fistulous  track or a dural tear.       8. Nerve damage: By working so close to the spinal cord, there is always a possibility of  nerve damage, which could be as serious as a permanent  spinal cord injury with  paralysis.       9. Death:  Although rare, severe deadly allergic reactions known as "Anaphylactic  reaction" can occur to any of the medications used.      10. Worsening of the symptoms:  We can always make thing worse.  What are the chances of something like this happening? Chances of any of this occuring are extremely low.  By statistics, you have more of a chance of getting killed in a motor vehicle accident: while driving to the hospital than any of the above occurring .  Nevertheless, you should be aware that they are possibilities.  In general, it is similar to taking a shower.  Everybody knows that you can slip, hit your head and get killed.  Does that mean that you should not shower again?  Nevertheless always keep in mind that statistics do not mean anything if you happen to be on  the wrong side of them.  Even if a procedure has a 1 (one) in a 1,000,000 (million) chance of going wrong, it you happen to be that one..Also, keep in mind that by statistics, you have more of a chance of having something go wrong when taking medications.  Who should not have this procedure? If you are on a blood thinning medication (e.g. Coumadin, Plavix, see list of "Blood Thinners"), or if you have an active infection going on, you should not have the procedure.  If you are taking any blood thinners, please inform your physician.  How should I prepare for this procedure? Do not eat or drink anything at least six hours prior to the procedure. Bring a driver with you .  It cannot be a taxi. Come accompanied by an adult that can drive you back, and that is strong enough to help you if your legs get weak or numb from the local anesthetic. Take all of your medicines the morning of the procedure with just enough water to swallow them. If you have diabetes, make sure that you are scheduled to have your procedure done first thing in the morning, whenever possible. If you have diabetes, take  only half of your insulin dose and notify our nurse that you have done so as soon as you arrive at the clinic. If you are diabetic, but only take blood sugar pills (oral hypoglycemic), then do not take them on the morning of your procedure.  You may take them after you have had the procedure. Do not take aspirin or any aspirin-containing medications, at least eleven (11) days prior to the procedure.  They may prolong bleeding. Wear loose fitting clothing that may be easy to take off and that you would not mind if it got stained with Betadine or blood. Do not wear any jewelry or perfume Remove any nail coloring.  It will interfere with some of our monitoring equipment.  NOTE: Remember that this is not meant to be interpreted as a complete list of all possible complications.  Unforeseen problems may occur.  BLOOD THINNERS The following drugs contain aspirin or other products, which can cause increased bleeding during surgery and should not be taken for 2 weeks prior to and 1 week after surgery.  If you should need take something for relief of minor pain, you may take acetaminophen which is found in Tylenol,m Datril, Anacin-3 and Panadol. It is not blood thinner. The products listed below are.  Do not take any of the products listed below in addition to any listed on your instruction sheet.  A.P.C or A.P.C with Codeine Codeine Phosphate Capsules #3 Ibuprofen Ridaura  ABC compound Congesprin Imuran rimadil  Advil Cope Indocin Robaxisal  Alka-Seltzer Effervescent Pain Reliever and Antacid Coricidin or Coricidin-D  Indomethacin Rufen  Alka-Seltzer plus Cold Medicine Cosprin Ketoprofen S-A-C Tablets  Anacin Analgesic Tablets or Capsules Coumadin Korlgesic Salflex  Anacin Extra Strength Analgesic tablets or capsules CP-2 Tablets Lanoril Salicylate  Anaprox Cuprimine Capsules Levenox Salocol  Anexsia-D Dalteparin Magan Salsalate  Anodynos Darvon compound Magnesium Salicylate Sine-off  Ansaid Dasin  Capsules Magsal Sodium Salicylate  Anturane Depen Capsules Marnal Soma  APF Arthritis pain formula Dewitt's Pills Measurin Stanback  Argesic Dia-Gesic Meclofenamic Sulfinpyrazone  Arthritis Bayer Timed Release Aspirin Diclofenac Meclomen Sulindac  Arthritis pain formula Anacin Dicumarol Medipren Supac  Analgesic (Safety coated) Arthralgen Diffunasal Mefanamic Suprofen  Arthritis Strength Bufferin Dihydrocodeine Mepro Compound Suprol  Arthropan liquid Dopirydamole Methcarbomol with Aspirin Synalgos  ASA tablets/Enseals Disalcid  Micrainin Tagament  Ascriptin Doan's Midol Talwin  Ascriptin A/D Dolene Mobidin Tanderil  Ascriptin Extra Strength Dolobid Moblgesic Ticlid  Ascriptin with Codeine Doloprin or Doloprin with Codeine Momentum Tolectin  Asperbuf Duoprin Mono-gesic Trendar  Aspergum Duradyne Motrin or Motrin IB Triminicin  Aspirin plain, buffered or enteric coated Durasal Myochrisine Trigesic  Aspirin Suppositories Easprin Nalfon Trillsate  Aspirin with Codeine Ecotrin Regular or Extra Strength Naprosyn Uracel  Atromid-S Efficin Naproxen Ursinus  Auranofin Capsules Elmiron Neocylate Vanquish  Axotal Emagrin Norgesic Verin  Azathioprine Empirin or Empirin with Codeine Normiflo Vitamin E  Azolid Emprazil Nuprin Voltaren  Bayer Aspirin plain, buffered or children's or timed BC Tablets or powders Encaprin Orgaran Warfarin Sodium  Buff-a-Comp Enoxaparin Orudis Zorpin  Buff-a-Comp with Codeine Equegesic Os-Cal-Gesic   Buffaprin Excedrin plain, buffered or Extra Strength Oxalid   Bufferin Arthritis Strength Feldene Oxphenbutazone   Bufferin plain or Extra Strength Feldene Capsules Oxycodone with Aspirin   Bufferin with Codeine Fenoprofen Fenoprofen Pabalate or Pabalate-SF   Buffets II Flogesic Panagesic   Buffinol plain or Extra Strength Florinal or Florinal with Codeine Panwarfarin   Buf-Tabs Flurbiprofen Penicillamine   Butalbital Compound Four-way cold tablets Penicillin    Butazolidin Fragmin Pepto-Bismol   Carbenicillin Geminisyn Percodan   Carna Arthritis Reliever Geopen Persantine   Carprofen Gold's salt Persistin   Chloramphenicol Goody's Phenylbutazone   Chloromycetin Haltrain Piroxlcam   Clmetidine heparin Plaquenil   Cllnoril Hyco-pap Ponstel   Clofibrate Hydroxy chloroquine Propoxyphen         Before stopping any of these medications, be sure to consult the physician who ordered them.  Some, such as Coumadin (Warfarin) are ordered to prevent or treat serious conditions such as "deep thrombosis", "pumonary embolisms", and other heart problems.  The amount of time that you may need off of the medication may also vary with the medication and the reason for which you were taking it.  If you are taking any of these medications, please make sure you notify your pain physician before you undergo any procedures.

## 2017-01-31 ENCOUNTER — Telehealth: Payer: Self-pay | Admitting: Pain Medicine

## 2017-01-31 ENCOUNTER — Telehealth: Payer: Self-pay

## 2017-01-31 NOTE — Telephone Encounter (Signed)
Patient called about legs swelling after procedure 01-30-17. She spoke with Jocelyn Lamer and was advised Dr. Dossie Arbour not in today. Jocelyn Lamer advised patient to elevate legs and increase intake of fluids. if this persists and she became more concerned to visit ED, especially if any headaches, dizziness, or shortness of breath.

## 2017-02-03 NOTE — Telephone Encounter (Signed)
Left message for patient to return call if her symptoms are not improved or for any concerns.

## 2017-02-06 ENCOUNTER — Other Ambulatory Visit: Payer: Self-pay | Admitting: Pulmonary Disease

## 2017-02-13 ENCOUNTER — Encounter: Payer: Self-pay | Admitting: Pulmonary Disease

## 2017-02-13 ENCOUNTER — Ambulatory Visit (INDEPENDENT_AMBULATORY_CARE_PROVIDER_SITE_OTHER): Payer: Medicare Other | Admitting: Pulmonary Disease

## 2017-02-13 VITALS — BP 130/70 | HR 109 | Ht 64.0 in | Wt 118.1 lb

## 2017-02-13 DIAGNOSIS — J84115 Respiratory bronchiolitis interstitial lung disease: Secondary | ICD-10-CM

## 2017-02-13 DIAGNOSIS — F17210 Nicotine dependence, cigarettes, uncomplicated: Secondary | ICD-10-CM | POA: Diagnosis not present

## 2017-02-13 DIAGNOSIS — F17200 Nicotine dependence, unspecified, uncomplicated: Secondary | ICD-10-CM

## 2017-02-13 MED ORDER — BENZONATATE 100 MG PO CAPS: ORAL_CAPSULE | ORAL | 3 refills | Status: DC

## 2017-02-13 NOTE — Patient Instructions (Addendum)
   Continue using your inhaler as prescribed.  Try to cut back on the ice cream.  We will see you back in 6 months or sooner if needed.
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
## 2017-02-17 ENCOUNTER — Ambulatory Visit: Payer: Medicare Other | Attending: Pain Medicine | Admitting: Pain Medicine

## 2017-02-17 ENCOUNTER — Encounter: Payer: Self-pay | Admitting: Pain Medicine

## 2017-02-17 VITALS — BP 148/74 | HR 117 | Temp 98.4°F | Resp 18 | Ht 64.0 in | Wt 118.0 lb

## 2017-02-17 DIAGNOSIS — I10 Essential (primary) hypertension: Secondary | ICD-10-CM | POA: Diagnosis not present

## 2017-02-17 DIAGNOSIS — E559 Vitamin D deficiency, unspecified: Secondary | ICD-10-CM | POA: Diagnosis not present

## 2017-02-17 DIAGNOSIS — G8929 Other chronic pain: Secondary | ICD-10-CM | POA: Diagnosis not present

## 2017-02-17 DIAGNOSIS — E785 Hyperlipidemia, unspecified: Secondary | ICD-10-CM | POA: Diagnosis not present

## 2017-02-17 DIAGNOSIS — M961 Postlaminectomy syndrome, not elsewhere classified: Secondary | ICD-10-CM | POA: Diagnosis not present

## 2017-02-17 DIAGNOSIS — Z809 Family history of malignant neoplasm, unspecified: Secondary | ICD-10-CM | POA: Insufficient documentation

## 2017-02-17 DIAGNOSIS — Z79891 Long term (current) use of opiate analgesic: Secondary | ICD-10-CM | POA: Diagnosis not present

## 2017-02-17 DIAGNOSIS — Z9889 Other specified postprocedural states: Secondary | ICD-10-CM | POA: Insufficient documentation

## 2017-02-17 DIAGNOSIS — M545 Low back pain, unspecified: Secondary | ICD-10-CM

## 2017-02-17 DIAGNOSIS — G8918 Other acute postprocedural pain: Secondary | ICD-10-CM | POA: Diagnosis not present

## 2017-02-17 DIAGNOSIS — M7918 Myalgia, other site: Secondary | ICD-10-CM

## 2017-02-17 DIAGNOSIS — I493 Ventricular premature depolarization: Secondary | ICD-10-CM | POA: Diagnosis not present

## 2017-02-17 DIAGNOSIS — G4700 Insomnia, unspecified: Secondary | ICD-10-CM | POA: Insufficient documentation

## 2017-02-17 DIAGNOSIS — M533 Sacrococcygeal disorders, not elsewhere classified: Secondary | ICD-10-CM | POA: Diagnosis not present

## 2017-02-17 DIAGNOSIS — Z8371 Family history of colonic polyps: Secondary | ICD-10-CM | POA: Diagnosis not present

## 2017-02-17 DIAGNOSIS — K219 Gastro-esophageal reflux disease without esophagitis: Secondary | ICD-10-CM | POA: Diagnosis not present

## 2017-02-17 DIAGNOSIS — J449 Chronic obstructive pulmonary disease, unspecified: Secondary | ICD-10-CM | POA: Insufficient documentation

## 2017-02-17 DIAGNOSIS — Z5181 Encounter for therapeutic drug level monitoring: Secondary | ICD-10-CM | POA: Diagnosis not present

## 2017-02-17 DIAGNOSIS — I951 Orthostatic hypotension: Secondary | ICD-10-CM | POA: Insufficient documentation

## 2017-02-17 DIAGNOSIS — Z981 Arthrodesis status: Secondary | ICD-10-CM

## 2017-02-17 DIAGNOSIS — F1190 Opioid use, unspecified, uncomplicated: Secondary | ICD-10-CM

## 2017-02-17 DIAGNOSIS — F17210 Nicotine dependence, cigarettes, uncomplicated: Secondary | ICD-10-CM | POA: Diagnosis not present

## 2017-02-17 DIAGNOSIS — R252 Cramp and spasm: Secondary | ICD-10-CM | POA: Diagnosis not present

## 2017-02-17 DIAGNOSIS — G894 Chronic pain syndrome: Secondary | ICD-10-CM | POA: Diagnosis not present

## 2017-02-17 DIAGNOSIS — M47816 Spondylosis without myelopathy or radiculopathy, lumbar region: Secondary | ICD-10-CM

## 2017-02-17 DIAGNOSIS — Z90710 Acquired absence of both cervix and uterus: Secondary | ICD-10-CM | POA: Insufficient documentation

## 2017-02-17 DIAGNOSIS — Z87828 Personal history of other (healed) physical injury and trauma: Secondary | ICD-10-CM

## 2017-02-17 MED ORDER — OXYCODONE HCL 5 MG PO TABS: 5.0000 mg | ORAL_TABLET | Freq: Three times a day (TID) | ORAL | 0 refills | Status: DC | PRN

## 2017-02-17 MED ORDER — CYCLOBENZAPRINE HCL 10 MG PO TABS: 10.0000 mg | ORAL_TABLET | Freq: Three times a day (TID) | ORAL | 2 refills | Status: DC | PRN

## 2017-02-17 NOTE — Patient Instructions (Signed)
You have been given 3 prescriptions for oxycodone  Today.

## 2017-02-17 NOTE — Progress Notes (Signed)
Nursing Pain Medication Assessment:  Safety precautions to be maintained throughout the outpatient stay will include: orient to surroundings, keep bed in low position, maintain call bell within reach at all times, provide assistance with transfer out of bed and ambulation.  Medication Inspection Compliance: Pill count conducted under aseptic conditions, in front of the patient. Neither the pills nor the bottle was removed from the patient's sight at any time. Once count was completed pills were immediately returned to the patient in their original bottle.  Medication: Oxycodone IR Pill/Patch Count: 2 of 90 pills remain Pill/Patch Appearance: Markings consistent with prescribed medication Bottle Appearance: Standard pharmacy container. Clearly labeled. Filled Date: 09/ 19 / 2018 Last Medication intake:  Today

## 2017-02-22 ENCOUNTER — Encounter: Payer: Self-pay | Admitting: Physician Assistant

## 2017-02-22 ENCOUNTER — Emergency Department
Admission: EM | Admit: 2017-02-22 | Discharge: 2017-02-22 | Discharge status: Against Medical Advice | Payer: Medicare Other | Attending: Emergency Medicine | Admitting: Emergency Medicine

## 2017-02-22 ENCOUNTER — Emergency Department: Payer: Medicare Other

## 2017-02-22 DIAGNOSIS — M545 Low back pain: Secondary | ICD-10-CM | POA: Diagnosis not present

## 2017-02-22 DIAGNOSIS — Z5329 Procedure and treatment not carried out because of patient's decision for other reasons: Secondary | ICD-10-CM | POA: Insufficient documentation

## 2017-02-22 DIAGNOSIS — G8929 Other chronic pain: Secondary | ICD-10-CM | POA: Insufficient documentation

## 2017-02-22 DIAGNOSIS — Z79899 Other long term (current) drug therapy: Secondary | ICD-10-CM | POA: Diagnosis not present

## 2017-02-22 DIAGNOSIS — I10 Essential (primary) hypertension: Secondary | ICD-10-CM | POA: Insufficient documentation

## 2017-02-22 DIAGNOSIS — Y939 Activity, unspecified: Secondary | ICD-10-CM | POA: Insufficient documentation

## 2017-02-22 DIAGNOSIS — W01198A Fall on same level from slipping, tripping and stumbling with subsequent striking against other object, initial encounter: Secondary | ICD-10-CM | POA: Diagnosis not present

## 2017-02-22 DIAGNOSIS — F17210 Nicotine dependence, cigarettes, uncomplicated: Secondary | ICD-10-CM | POA: Diagnosis not present

## 2017-02-22 DIAGNOSIS — Y999 Unspecified external cause status: Secondary | ICD-10-CM | POA: Diagnosis not present

## 2017-02-22 DIAGNOSIS — J449 Chronic obstructive pulmonary disease, unspecified: Secondary | ICD-10-CM | POA: Insufficient documentation

## 2017-02-22 DIAGNOSIS — Y9289 Other specified places as the place of occurrence of the external cause: Secondary | ICD-10-CM | POA: Insufficient documentation

## 2017-02-22 LAB — TOXASSURE SELECT 13 (MW), URINE

## 2017-02-22 NOTE — ED Notes (Signed)
This RN was notified by Xray that pt not in room and no belongings in room at this time.  Pt thought to have eloped.

## 2017-02-22 NOTE — ED Triage Notes (Signed)
Patient reports she has had 4 falls since last pm.  Patient reporting lower back pain at this time.
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
## 2017-02-28 DIAGNOSIS — L299 Pruritus, unspecified: Secondary | ICD-10-CM | POA: Diagnosis not present

## 2017-02-28 DIAGNOSIS — R5383 Other fatigue: Secondary | ICD-10-CM | POA: Diagnosis not present

## 2017-02-28 DIAGNOSIS — B86 Scabies: Secondary | ICD-10-CM | POA: Diagnosis not present

## 2017-02-28 DIAGNOSIS — J019 Acute sinusitis, unspecified: Secondary | ICD-10-CM | POA: Diagnosis not present

## 2017-03-18 ENCOUNTER — Ambulatory Visit: Payer: Medicare Other | Admitting: Nurse Practitioner

## 2017-03-20 ENCOUNTER — Other Ambulatory Visit: Payer: Self-pay | Admitting: Family Medicine

## 2017-03-20 DIAGNOSIS — Z1231 Encounter for screening mammogram for malignant neoplasm of breast: Secondary | ICD-10-CM | POA: Diagnosis not present

## 2017-03-20 DIAGNOSIS — F17210 Nicotine dependence, cigarettes, uncomplicated: Secondary | ICD-10-CM | POA: Diagnosis not present

## 2017-03-20 DIAGNOSIS — N6314 Unspecified lump in the right breast, lower inner quadrant: Secondary | ICD-10-CM | POA: Diagnosis not present

## 2017-03-20 DIAGNOSIS — Z1389 Encounter for screening for other disorder: Secondary | ICD-10-CM | POA: Diagnosis not present

## 2017-03-20 DIAGNOSIS — Z0001 Encounter for general adult medical examination with abnormal findings: Secondary | ICD-10-CM | POA: Diagnosis not present

## 2017-03-25 ENCOUNTER — Other Ambulatory Visit: Payer: Self-pay | Admitting: Family Medicine

## 2017-03-25 DIAGNOSIS — N6310 Unspecified lump in the right breast, unspecified quadrant: Secondary | ICD-10-CM

## 2017-03-31 ENCOUNTER — Ambulatory Visit
Admission: RE | Admit: 2017-03-31 | Discharge: 2017-03-31 | Disposition: A | Discharge status: Home / Self Care | Payer: Medicare Other | Source: Ambulatory Visit | Attending: Family Medicine | Admitting: Family Medicine

## 2017-03-31 ENCOUNTER — Other Ambulatory Visit: Payer: Self-pay | Admitting: Family Medicine

## 2017-03-31 DIAGNOSIS — N6310 Unspecified lump in the right breast, unspecified quadrant: Secondary | ICD-10-CM

## 2017-03-31 DIAGNOSIS — N6489 Other specified disorders of breast: Secondary | ICD-10-CM | POA: Diagnosis not present

## 2017-03-31 DIAGNOSIS — R928 Other abnormal and inconclusive findings on diagnostic imaging of breast: Secondary | ICD-10-CM | POA: Diagnosis not present

## 2017-04-25 ENCOUNTER — Telehealth: Payer: Self-pay | Admitting: Adult Health

## 2017-04-25 NOTE — Telephone Encounter (Signed)
Called pt and advised message from the provider. Pt understood and verbalized understanding. Nothing further is needed.   She will call us back, not up to doing plan recommended by MW. I advised her to make an appt to discuss.

## 2017-04-25 NOTE — Telephone Encounter (Signed)
Spoke with pt, requesting a refill on her cheratussin cough syrup.  Last refill: 10/10/16 #163mL with 0 refills.  Pt states she takes this only PRN.  Notes that she has been experiencing increased prod cough with unknown color, sore/scratchy throat X2-3 days.    Pt uses [facility].    Sending this to DOD as this is a [facility] pt.  MW please advise.  Thanks!

## 2017-04-25 NOTE — Telephone Encounter (Signed)
Already listed as taking narcotics so can't precribe another one  rec  Delsym 2 tsp every 12 hours until we can get her back and d/c fosfamax for the short term and Try prilosec otc 20mg   Take 30-60 min before first meal of the day and Pepcid ac (famotidine) 20 mg one @  bedtime until cough is completely gone for at least a week without the need for cough suppression

## 2017-05-20 ENCOUNTER — Ambulatory Visit: Payer: Medicare Other | Attending: Nurse Practitioner | Admitting: Nurse Practitioner

## 2017-05-20 ENCOUNTER — Other Ambulatory Visit: Payer: Self-pay

## 2017-05-20 ENCOUNTER — Encounter: Payer: Self-pay | Admitting: Nurse Practitioner

## 2017-05-20 VITALS — BP 121/59 | HR 110 | Temp 97.6°F | Resp 16 | Ht 64.0 in | Wt 124.2 lb

## 2017-05-20 DIAGNOSIS — M961 Postlaminectomy syndrome, not elsewhere classified: Secondary | ICD-10-CM | POA: Diagnosis not present

## 2017-05-20 DIAGNOSIS — K219 Gastro-esophageal reflux disease without esophagitis: Secondary | ICD-10-CM | POA: Diagnosis not present

## 2017-05-20 DIAGNOSIS — E785 Hyperlipidemia, unspecified: Secondary | ICD-10-CM | POA: Insufficient documentation

## 2017-05-20 DIAGNOSIS — E46 Unspecified protein-calorie malnutrition: Secondary | ICD-10-CM | POA: Insufficient documentation

## 2017-05-20 DIAGNOSIS — I10 Essential (primary) hypertension: Secondary | ICD-10-CM | POA: Insufficient documentation

## 2017-05-20 DIAGNOSIS — M533 Sacrococcygeal disorders, not elsewhere classified: Secondary | ICD-10-CM | POA: Insufficient documentation

## 2017-05-20 DIAGNOSIS — G901 Familial dysautonomia [Riley-Day]: Secondary | ICD-10-CM | POA: Insufficient documentation

## 2017-05-20 DIAGNOSIS — I493 Ventricular premature depolarization: Secondary | ICD-10-CM | POA: Diagnosis not present

## 2017-05-20 DIAGNOSIS — M25551 Pain in right hip: Secondary | ICD-10-CM | POA: Diagnosis not present

## 2017-05-20 DIAGNOSIS — J449 Chronic obstructive pulmonary disease, unspecified: Secondary | ICD-10-CM | POA: Diagnosis not present

## 2017-05-20 DIAGNOSIS — G8918 Other acute postprocedural pain: Secondary | ICD-10-CM | POA: Insufficient documentation

## 2017-05-20 DIAGNOSIS — M7918 Myalgia, other site: Secondary | ICD-10-CM

## 2017-05-20 DIAGNOSIS — M25552 Pain in left hip: Secondary | ICD-10-CM | POA: Insufficient documentation

## 2017-05-20 DIAGNOSIS — G8929 Other chronic pain: Secondary | ICD-10-CM

## 2017-05-20 DIAGNOSIS — M47816 Spondylosis without myelopathy or radiculopathy, lumbar region: Secondary | ICD-10-CM

## 2017-05-20 DIAGNOSIS — G4701 Insomnia due to medical condition: Secondary | ICD-10-CM | POA: Diagnosis not present

## 2017-05-20 DIAGNOSIS — Z79899 Other long term (current) drug therapy: Secondary | ICD-10-CM | POA: Insufficient documentation

## 2017-05-20 DIAGNOSIS — R252 Cramp and spasm: Secondary | ICD-10-CM | POA: Insufficient documentation

## 2017-05-20 DIAGNOSIS — G894 Chronic pain syndrome: Secondary | ICD-10-CM | POA: Diagnosis not present

## 2017-05-20 DIAGNOSIS — Z5181 Encounter for therapeutic drug level monitoring: Secondary | ICD-10-CM | POA: Insufficient documentation

## 2017-05-20 DIAGNOSIS — F17210 Nicotine dependence, cigarettes, uncomplicated: Secondary | ICD-10-CM | POA: Diagnosis not present

## 2017-05-20 DIAGNOSIS — M792 Neuralgia and neuritis, unspecified: Secondary | ICD-10-CM | POA: Diagnosis not present

## 2017-05-20 DIAGNOSIS — M47896 Other spondylosis, lumbar region: Secondary | ICD-10-CM | POA: Insufficient documentation

## 2017-05-20 DIAGNOSIS — R209 Unspecified disturbances of skin sensation: Secondary | ICD-10-CM | POA: Diagnosis not present

## 2017-05-20 DIAGNOSIS — Z79891 Long term (current) use of opiate analgesic: Secondary | ICD-10-CM | POA: Insufficient documentation

## 2017-05-20 DIAGNOSIS — M545 Low back pain, unspecified: Secondary | ICD-10-CM

## 2017-05-20 DIAGNOSIS — J84115 Respiratory bronchiolitis interstitial lung disease: Secondary | ICD-10-CM | POA: Insufficient documentation

## 2017-05-20 DIAGNOSIS — E559 Vitamin D deficiency, unspecified: Secondary | ICD-10-CM | POA: Insufficient documentation

## 2017-05-20 MED ORDER — CYCLOBENZAPRINE HCL 10 MG PO TABS: 10.0000 mg | ORAL_TABLET | Freq: Three times a day (TID) | ORAL | 2 refills | Status: DC | PRN

## 2017-05-20 MED ORDER — OXYCODONE HCL 5 MG PO TABS: 5.0000 mg | ORAL_TABLET | Freq: Three times a day (TID) | ORAL | 0 refills | Status: DC | PRN

## 2017-05-20 MED ORDER — TRAZODONE HCL 100 MG PO TABS: 100.0000 mg | ORAL_TABLET | Freq: Every day | ORAL | 0 refills | Status: DC

## 2017-05-20 MED ORDER — GABAPENTIN 100 MG PO CAPS: 200.0000 mg | ORAL_CAPSULE | Freq: Two times a day (BID) | ORAL | 2 refills | Status: DC

## 2017-05-20 NOTE — Progress Notes (Signed)
Nursing Pain Medication Assessment:  Safety precautions to be maintained throughout the outpatient stay will include: orient to surroundings, keep bed in low position, maintain call bell within reach at all times, provide assistance with transfer out of bed and ambulation.  Medication Inspection Compliance: Pill count conducted under aseptic conditions, in front of the patient. Neither the pills nor the bottle was removed from the patient's sight at any time. Once count was completed pills were immediately returned to the patient in their original bottle.  Medication: Oxycodone IR Pill/Patch Count: 7 of 90 pills remain Pill/Patch Appearance: Markings consistent with prescribed medication Bottle Appearance: Standard pharmacy container. Clearly labeled. Filled Date: 22 / 51 / 2018 Last Medication intake:  Today

## 2017-05-20 NOTE — Progress Notes (Addendum)
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    Answer:   Milinda Pointer [phone]  . oxyCODONE (OXY IR/ROXICODONE) 5 MG immediate release tablet    Sig: Take 1 tablet (5 mg total) by mouth every 8 (eight) hours as needed for severe pain.    Dispense:  90 tablet    Refill:  0    Do not place this medication, or any other prescription from our practice, on "Automatic Refill". Patient may have prescription filled one day early if pharmacy is closed on scheduled refill date. Do not fill until: 05/22/2017 To last until: 06/21/2017    Order Specific Question:   Supervising Provider    Answer:   Milinda Pointer [phone]  . cyclobenzaprine (FLEXERIL) 10 MG tablet    Sig: Take 1 tablet (10 mg total) by mouth 3 (three) times daily as needed for muscle spasms.    Dispense:  90 tablet    Refill:  2    Do not place this medication, or any other prescription from our practice, on "Automatic  Refill". Patient may have prescription filled one day early if pharmacy is closed on scheduled refill date.    Order Specific Question:   Supervising Provider    Answer:   Milinda Pointer [phone]  . traZODone (DESYREL) 100 MG tablet    Sig: Take 1 tablet (100 mg total) by mouth at bedtime.    Dispense:  90 tablet    Refill:  0    Do not add this medication to the electronic "Automatic Refill" notification system. Patient may have prescription filled one day early if pharmacy is closed on scheduled refill date.    Order Specific Question:   Supervising Provider    Answer:   Milinda Pointer [phone]  . gabapentin (NEURONTIN) 100 MG capsule    Sig: Take 2 capsules (200 mg total) by mouth 2 (two) times daily.    Dispense:  120 capsule    Refill:  2    Order Specific Question:   Supervising Provider    AnswerMilinda Pointer [phone]   New Prescriptions   No medications on file   Medications administered today: Letetia Romanello had no medications administered during this visit. Lab-work, procedure(s), and/or referral(s): No orders of the defined types were placed in this encounter.  Imaging and/or referral(s): None  Interventional therapies: Planned, scheduled, and/or pending:  Palliative bilateral lumbar facet block and diagnostic bilateral sacroiliac joint block   Considering:  Possible bilateral lumbar facet radiofrequency ablation bilateral diagnostic sacroiliac joint block with 0% relief. bilateral diagnostic lumbar facet block #2with 0% relief. bilateral diagnostic lumbar facet block #1  with 100% relief.  diagnostic bilateral lumbar facet block (#3) with 100% relief.   Palliative PRN treatment(s):  Palliative bilateral lumbar facet block    Provider-requested follow-up: Return in about 3 months (around 08/18/2017) for MedMgmt with Me Dionisio David), in addition, w/ Dr. Dossie Arbour, Procedure(w/Sedation).  Future Appointments  Date Time Provider [place]   05/27/2017  9:45 AM Milinda Pointer, MD [facility] None  08/11/2017  9:00 AM Vevelyn Francois, NP [facility] None   Primary Care Physician: [name], FNP Location: [facility] Note by: Vevelyn Francois NP Date: 05/20/2017; Time: 10:38 AM  Pain Score Disclaimer: We use the NRS-11 scale. This is a self-reported, subjective measurement of pain severity with only modest accuracy. It is used primarily to identify changes within a particular patient. It must be understood that outpatient pain scales are significantly less accurate that those used for research, where they can be applied under ideal controlled circumstances with minimal exposure to variables. In reality, the score is likely to be a combination of pain intensity and pain affect, where pain affect describes the degree of emotional arousal or changes in action readiness caused by the sensory experience of pain. Factors such as social and work situation, setting, emotional state, anxiety levels, expectation, and prior pain experience may influence pain perception and show large inter-individual differences that may also be affected by time variables.  Patient instructions provided during this appointment: Patient Instructions   ____________________________________________________________________________________________  Medication Rules  Applies to: All patients receiving prescriptions (written or electronic).  Pharmacy of record: Pharmacy where electronic prescriptions will be sent. If written prescriptions are taken to a different pharmacy, please inform the nursing staff. The pharmacy listed in the electronic medical record should be the one where you would like electronic prescriptions to be sent.  Prescription refills: Only during scheduled appointments. Applies to both, written and electronic prescriptions.  NOTE: The following applies primarily to controlled substances (Opioid* Pain Medications).    Patient's responsibilities: 1. Pain Pills: Bring all pain pills to every appointment (except for procedure appointments). 2. Pill Bottles: Bring pills in original pharmacy bottle. Always bring newest bottle. Bring bottle, even if empty. 3. Medication refills: You are responsible for knowing and keeping track of what medications you need refilled. The day before your appointment, write a list of all prescriptions that need to be refilled. Bring that list to your appointment and give it to the admitting nurse. Prescriptions will be written only during appointments. If you forget a medication, it will not be "Called in", "Faxed", or "electronically sent". You will need to get another appointment to get these prescribed. 4. Prescription Accuracy: You are responsible for carefully inspecting your prescriptions before leaving our office. Have the discharge nurse carefully go over each prescription with you, before taking them home. Make sure that your name is accurately spelled, that your address is correct. Check the name and dose of your medication to make sure it is accurate. Check the number of pills, and the written instructions to make sure they are clear and accurate. Make sure that you are given enough medication to last until your next medication refill appointment. 5. Taking Medication: Take medication as prescribed. Never take more pills than instructed. Never take medication more frequently than prescribed. Taking less pills or less frequently is permitted and encouraged, when it comes to controlled substances (written prescriptions).  6. Inform other Doctors: Always inform, all of your healthcare providers, of all the medications you take. 7.  Pain Medication from other Providers: You are not allowed to accept any additional pain medication from any other Doctor or Healthcare provider. There are two exceptions to this rule. (see below) In the event that you require additional pain medication, you  are responsible for notifying us, as stated below. 8. Medication Agreement: You are responsible for carefully reading and following our Medication Agreement. This must be signed before receiving any prescriptions from our practice. Safely store a copy of your signed Agreement. Violations to the Agreement will result in no further prescriptions. (Additional copies of our Medication Agreement are available upon request.) 9. Laws, Rules, & Regulations: All patients are expected to follow all Federal and [employer], Rules, [employer]. Ignorance of the Laws does not constitute a valid excuse. The use of any illegal substances is prohibited. 10. Adopted CDC guidelines & recommendations: Target dosing levels will be at or below 60 MME/day. Use of benzodiazepines** is not recommended.  Exceptions: There are only two exceptions to the rule of not receiving pain medications from other Healthcare Providers. 1. Exception #1 (Emergencies): In the event of an emergency (i.e.: accident requiring emergency care), you are allowed to receive additional pain medication. However, you are responsible for: As soon as you are able, call our office [phone], at any time of the day or night, and leave a message stating your name, the date and nature of the emergency, and the name and dose of the medication prescribed. In the event that your call is answered by a member of our staff, make sure to document and save the date, time, and the name of the person that took your information.  2. Exception #2 (Planned Surgery): In the event that you are scheduled by another doctor or dentist to have any type of surgery or procedure, you are allowed (for a period no longer than 30 days), to receive additional pain medication, for the acute post-op pain. However, in this case, you are responsible for picking up a copy of our "Post-op Pain Management for Surgeons" handout, and giving it to your surgeon or dentist. This document  is available at our office, and does not require an appointment to obtain it. Simply go to our office during business hours (Monday-Thursday from 8:00 AM to 4:00 PM) (Friday 8:00 AM to 12:00 Noon) or if you have a scheduled appointment with [place], prior to your surgery, and ask for it by name. In addition, you will need to provide [place] with your name, name of your surgeon, type of surgery, and date of procedure or surgery.  *Opioid medications include: morphine, codeine, oxycodone, oxymorphone, hydrocodone, hydromorphone, meperidine, tramadol, tapentadol, buprenorphine, fentanyl, methadone. **Benzodiazepine medications include: diazepam (Valium), alprazolam (Xanax), clonazepam (Klonopine), lorazepam (Ativan), clorazepate (Tranxene), chlordiazepoxide (Librium), estazolam (Prosom), oxazepam (Serax), temazepam (Restoril), triazolam (Halcion)  ____________________________________________________________________________________________  Sacroiliac (SI) Joint Injection Patient Information  Description: The sacroiliac joint connects the scrum (very low back and tailbone) to the ilium (a pelvic bone which also forms half of the hip joint).  Normally this joint experiences very little motion.  When this joint becomes inflamed or unstable low back and or hip and pelvis pain may result.  Injection of this joint with local anesthetics (numbing medicines) and steroids can provide diagnostic information and reduce pain.  This injection is performed with the aid of x-ray guidance into the tailbone area while you are lying on your stomach.   You may experience an electrical sensation down the leg while this is being done.  You may also experience numbness.  We also may ask if we are reproducing your normal pain during the injection.  Conditions which may be treated SI injection:   Low back, buttock, hip or leg pain  Preparation for the Injection:  3. Do not eat any solid food or dairy products within 8 hours of your  appointment.  4. You may drink clear liquids up to 3 hours before appointment.  Clear liquids include water, black coffee, juice or soda.  No milk or cream please. 5. You may take your regular medications, including pain medications with a sip of water before your appointment.  Diabetics should hold regular insulin (if take separately) and take 1/2 normal NPH dose the morning of the procedure.  Carry some sugar containing items with you to your appointment. 6. A driver must accompany you and be prepared to drive you home after your procedure. 7. Bring all of your current medications with you. 8. An IV may be inserted and sedation may be given at the discretion of the physician. 9. A blood pressure cuff, EKG and other monitors will often be applied during the procedure.  Some patients may need to have extra oxygen administered for a short period.  10. You will be asked to provide medical information, including your allergies, prior to the procedure.  We must know immediately if you are taking blood thinners (like Coumadin/Warfarin) or if you are allergic to IV iodine contrast (dye).  We must know if you could possible be pregnant.  Possible side effects:   Bleeding from needle site  Infection (rare, may require surgery)  Nerve injury (rare)  Numbness & tingling (temporary)  A brief convulsion or seizure  Light-headedness (temporary)  Pain at injection site (several days)  Decreased blood pressure (temporary)  Weakness in the leg (temporary)   Call if you experience:   New onset weakness or numbness of an extremity below the injection site that last more than 8 hours.  Hives or difficulty breathing ( go to the emergency room)  Inflammation or drainage at the injection site  Any new symptoms which are concerning to you  Please note:  Although the local anesthetic injected can often make your back/ hip/ buttock/ leg feel good for several hours after the injections, the pain  will likely return.  It takes 3-7 days for steroids to work in the sacroiliac area.  You may not notice any pain relief for at least that one week.  If effective, we will often do a series of three injections spaced 3-6 weeks apart to maximally decrease your pain.  After the initial series, we generally will wait some months before a repeat injection of the same type.  If you have any questions, please call [phone] [facility]   Facet Joint Block The facet joints connect the bones of the spine (vertebrae). They make it possible for you to bend, twist, and make other movements with your spine. They also keep you from bending too far, twisting too far, and making other excessive movements. A facet joint block is a procedure where a numbing medicine (anesthetic) is injected into a facet joint. Often, a type of anti-inflammatory medicine called a steroid is also injected. A facet joint block may be done to diagnose neck or back pain. If the pain gets better after a facet joint block, it means the pain is probably coming from the facet joint. If the pain does not get better, it means  the pain is probably not coming from the facet joint. A facet joint block may also be done to relieve neck or back pain caused by an inflamed facet joint. A facet joint block is only done to relieve pain if the pain does not improve with other methods, such as medicine, exercise programs, and physical therapy. Tell a health care provider about:  Any allergies you have.  All medicines you are taking, including vitamins, herbs, eye drops, creams, and over-the-counter medicines.  Any problems you or family members have had with anesthetic medicines.  Any blood disorders you have.  Any surgeries you have had.  Any medical conditions you have.  Whether you are pregnant or may be pregnant. What are the risks? Generally, this is a safe procedure. However, problems may occur,  including:  Bleeding.  Injury to a nerve near the injection site.  Pain at the injection site.  Weakness or numbness in areas controlled by nerves near the injection site.  Infection.  Temporary fluid retention.  Allergic reactions to medicines or dyes.  Injury to other structures or organs near the injection site.  What happens before the procedure?  Follow instructions from your health care provider about eating or drinking restrictions.  Ask your health care provider about: ? Changing or stopping your regular medicines. This is especially important if you are taking diabetes medicines or blood thinners. ? Taking medicines such as aspirin and ibuprofen. These medicines can thin your blood. Do not take these medicines before your procedure if your health care provider instructs you not to.  Do not take any new dietary supplements or medicines without asking your health care provider first.  Plan to have someone take you home after the procedure. What happens during the procedure?  You may need to remove your clothing and dress in an open-back gown.  The procedure will be done while you are lying on an X-ray table. You will most likely be asked to lie on your stomach, but you may be asked to lie in a different position if an injection will be made in your neck.  Machines will be used to monitor your oxygen levels, heart rate, and blood pressure.  If an injection will be made in your neck, an IV tube will be inserted into one of your veins. Fluids and medicine will flow directly into your body through the IV tube.  The area over the facet joint where the injection will be made will be cleaned with soap. The surrounding skin will be covered with clean drapes.  A numbing medicine (local anesthetic) will be applied to your skin. Your skin may sting or burn for a moment.  A video X-ray machine (fluoroscopy) will be used to locate the joint. In some cases, a CT scan may be  used.  A contrast dye may be injected into the facet joint area to help locate the joint.  When the joint is located, an anesthetic will be injected into the joint through the needle.  Your health care provider will ask you whether you feel pain relief. If you do feel relief, a steroid may be injected to provide pain relief for a longer period of time. If you do not feel relief or feel only partial relief, additional injections of an anesthetic may be made in other facet joints.  The needle will be removed.  Your skin will be cleaned.  A bandage (dressing) will be applied over each injection site. The procedure may vary among health  care providers and hospitals. What happens after the procedure?  You will be observed for 15-30 minutes before being allowed to go home. This information is not intended to replace advice given to you by your health care provider. Make sure you discuss any questions you have with your health care provider. Document Released: 09/11/2006 Document Revised: 05/24/2015 Document Reviewed: 01/16/2015 [employer]vier Interactive Patient Education  [employer].

## 2017-05-20 NOTE — Patient Instructions (Addendum)
____________________________________________________________________________________________  Medication Rules  Applies to: All patients receiving prescriptions (written or electronic).  Pharmacy of record: Pharmacy where electronic prescriptions will be sent. If written prescriptions are taken to a different pharmacy, please inform the nursing staff. The pharmacy listed in the electronic medical record should be the one where you would like electronic prescriptions to be sent.  Prescription refills: Only during scheduled appointments. Applies to both, written and electronic prescriptions.  NOTE: The following applies primarily to controlled substances (Opioid* Pain Medications).   Patient's responsibilities: 1. Pain Pills: Bring all pain pills to every appointment (except for procedure appointments). 2. Pill Bottles: Bring pills in original pharmacy bottle. Always bring newest bottle. Bring bottle, even if empty. 3. Medication refills: You are responsible for knowing and keeping track of what medications you need refilled. The day before your appointment, write a list of all prescriptions that need to be refilled. Bring that list to your appointment and give it to the admitting nurse. Prescriptions will be written only during appointments. If you forget a medication, it will not be "Called in", "Faxed", or "electronically sent". You will need to get another appointment to get these prescribed. 4. Prescription Accuracy: You are responsible for carefully inspecting your prescriptions before leaving our office. Have the discharge nurse carefully go over each prescription with you, before taking them home. Make sure that your name is accurately spelled, that your address is correct. Check the name and dose of your medication to make sure it is accurate. Check the number of pills, and the written instructions to make sure they are clear and accurate. Make sure that you are given enough medication to  last until your next medication refill appointment. 5. Taking Medication: Take medication as prescribed. Never take more pills than instructed. Never take medication more frequently than prescribed. Taking less pills or less frequently is permitted and encouraged, when it comes to controlled substances (written prescriptions).  6. Inform other Doctors: Always inform, all of your healthcare providers, of all the medications you take. 7. Pain Medication from other Providers: You are not allowed to accept any additional pain medication from any other Doctor or Healthcare provider. There are two exceptions to this rule. (see below) In the event that you require additional pain medication, you are responsible for notifying us, as stated below. 8. Medication Agreement: You are responsible for carefully reading and following our Medication Agreement. This must be signed before receiving any prescriptions from our practice. Safely store a copy of your signed Agreement. Violations to the Agreement will result in no further prescriptions. (Additional copies of our Medication Agreement are available upon request.) 9. Laws, Rules, & Regulations: All patients are expected to follow all Federal and State Laws, Statutes, Rules, & Regulations. Ignorance of the Laws does not constitute a valid excuse. The use of any illegal substances is prohibited. 10. Adopted CDC guidelines & recommendations: Target dosing levels will be at or below 60 MME/day. Use of benzodiazepines** is not recommended.  Exceptions: There are only two exceptions to the rule of not receiving pain medications from other Healthcare Providers. 1. Exception #1 (Emergencies): In the event of an emergency (i.e.: accident requiring emergency care), you are allowed to receive additional pain medication. However, you are responsible for: As soon as you are able, call our office [phone], at any time of the day or night, and leave a message stating your  name, the date and nature of the emergency, and the name and dose of the medication   prescribed. In the event that your call is answered by a member of our staff, make sure to document and save the date, time, and the name of the person that took your information.  2. Exception #2 (Planned Surgery): In the event that you are scheduled by another doctor or dentist to have any type of surgery or procedure, you are allowed (for a period no longer than 30 days), to receive additional pain medication, for the acute post-op pain. However, in this case, you are responsible for picking up a copy of our "Post-op Pain Management for Surgeons" handout, and giving it to your surgeon or dentist. This document is available at our office, and does not require an appointment to obtain it. Simply go to our office during business hours (Monday-Thursday from 8:00 AM to 4:00 PM) (Friday 8:00 AM to 12:00 Noon) or if you have a scheduled appointment with [place], prior to your surgery, and ask for it by name. In addition, you will need to provide [place] with your name, name of your surgeon, type of surgery, and date of procedure or surgery.  *Opioid medications include: morphine, codeine, oxycodone, oxymorphone, hydrocodone, hydromorphone, meperidine, tramadol, tapentadol, buprenorphine, fentanyl, methadone. **Benzodiazepine medications include: diazepam (Valium), alprazolam (Xanax), clonazepam (Klonopine), lorazepam (Ativan), clorazepate (Tranxene), chlordiazepoxide (Librium), estazolam (Prosom), oxazepam (Serax), temazepam (Restoril), triazolam (Halcion)  ____________________________________________________________________________________________  Sacroiliac (SI) Joint Injection Patient Information  Description: The sacroiliac joint connects the scrum (very low back and tailbone) to the ilium (a pelvic bone which also forms half of the hip joint).  Normally this joint experiences very little motion.  When this joint becomes inflamed  or unstable low back and or hip and pelvis pain may result.  Injection of this joint with local anesthetics (numbing medicines) and steroids can provide diagnostic information and reduce pain.  This injection is performed with the aid of x-ray guidance into the tailbone area while you are lying on your stomach.   You may experience an electrical sensation down the leg while this is being done.  You may also experience numbness.  We also may ask if we are reproducing your normal pain during the injection.  Conditions which may be treated SI injection:   Low back, buttock, hip or leg pain  Preparation for the Injection:  3. Do not eat any solid food or dairy products within 8 hours of your appointment.  4. You may drink clear liquids up to 3 hours before appointment.  Clear liquids include water, black coffee, juice or soda.  No milk or cream please. 5. You may take your regular medications, including pain medications with a sip of water before your appointment.  Diabetics should hold regular insulin (if take separately) and take 1/2 normal NPH dose the morning of the procedure.  Carry some sugar containing items with you to your appointment. 6. A driver must accompany you and be prepared to drive you home after your procedure. 7. Bring all of your current medications with you. 8. An IV may be inserted and sedation may be given at the discretion of the physician. 9. A blood pressure cuff, EKG and other monitors will often be applied during the procedure.  Some patients may need to have extra oxygen administered for a short period.  10. You will be asked to provide medical information, including your allergies, prior to the procedure.  We must know immediately if you are taking blood thinners (like Coumadin/Warfarin) or if you are allergic to IV iodine contrast (dye).  We  must know if you could possible be pregnant.  Possible side effects:   Bleeding from needle site  Infection (rare, may  require surgery)  Nerve injury (rare)  Numbness & tingling (temporary)  A brief convulsion or seizure  Light-headedness (temporary)  Pain at injection site (several days)  Decreased blood pressure (temporary)  Weakness in the leg (temporary)   Call if you experience:   New onset weakness or numbness of an extremity below the injection site that last more than 8 hours.  Hives or difficulty breathing ( go to the emergency room)  Inflammation or drainage at the injection site  Any new symptoms which are concerning to you  Please note:  Although the local anesthetic injected can often make your back/ hip/ buttock/ leg feel good for several hours after the injections, the pain will likely return.  It takes 3-7 days for steroids to work in the sacroiliac area.  You may not notice any pain relief for at least that one week.  If effective, we will often do a series of three injections spaced 3-6 weeks apart to maximally decrease your pain.  After the initial series, we generally will wait some months before a repeat injection of the same type.  If you have any questions, please call [phone] [facility]   Facet Joint Block The facet joints connect the bones of the spine (vertebrae). They make it possible for you to bend, twist, and make other movements with your spine. They also keep you from bending too far, twisting too far, and making other excessive movements. A facet joint block is a procedure where a numbing medicine (anesthetic) is injected into a facet joint. Often, a type of anti-inflammatory medicine called a steroid is also injected. A facet joint block may be done to diagnose neck or back pain. If the pain gets better after a facet joint block, it means the pain is probably coming from the facet joint. If the pain does not get better, it means the pain is probably not coming from the facet joint. A facet joint block may also be done  to relieve neck or back pain caused by an inflamed facet joint. A facet joint block is only done to relieve pain if the pain does not improve with other methods, such as medicine, exercise programs, and physical therapy. Tell a health care provider about:  Any allergies you have.  All medicines you are taking, including vitamins, herbs, eye drops, creams, and over-the-counter medicines.  Any problems you or family members have had with anesthetic medicines.  Any blood disorders you have.  Any surgeries you have had.  Any medical conditions you have.  Whether you are pregnant or may be pregnant. What are the risks? Generally, this is a safe procedure. However, problems may occur, including:  Bleeding.  Injury to a nerve near the injection site.  Pain at the injection site.  Weakness or numbness in areas controlled by nerves near the injection site.  Infection.  Temporary fluid retention.  Allergic reactions to medicines or dyes.  Injury to other structures or organs near the injection site.  What happens before the procedure?  Follow instructions from your health care provider about eating or drinking restrictions.  Ask your health care provider about: ? Changing or stopping your regular medicines. This is especially important if you are taking diabetes medicines or blood thinners. ? Taking medicines such as aspirin and ibuprofen. These medicines can thin your blood.  Do not take these medicines before your procedure if your health care provider instructs you not to.  Do not take any new dietary supplements or medicines without asking your health care provider first.  Plan to have someone take you home after the procedure. What happens during the procedure?  You may need to remove your clothing and dress in an open-back gown.  The procedure will be done while you are lying on an X-ray table. You will most likely be asked to lie on your stomach, but you may be asked to  lie in a different position if an injection will be made in your neck.  Machines will be used to monitor your oxygen levels, heart rate, and blood pressure.  If an injection will be made in your neck, an IV tube will be inserted into one of your veins. Fluids and medicine will flow directly into your body through the IV tube.  The area over the facet joint where the injection will be made will be cleaned with soap. The surrounding skin will be covered with clean drapes.  A numbing medicine (local anesthetic) will be applied to your skin. Your skin may sting or burn for a moment.  A video X-ray machine (fluoroscopy) will be used to locate the joint. In some cases, a CT scan may be used.  A contrast dye may be injected into the facet joint area to help locate the joint.  When the joint is located, an anesthetic will be injected into the joint through the needle.  Your health care provider will ask you whether you feel pain relief. If you do feel relief, a steroid may be injected to provide pain relief for a longer period of time. If you do not feel relief or feel only partial relief, additional injections of an anesthetic may be made in other facet joints.  The needle will be removed.  Your skin will be cleaned.  A bandage (dressing) will be applied over each injection site. The procedure may vary among health care providers and hospitals. What happens after the procedure?  You will be observed for 15-30 minutes before being allowed to go home. This information is not intended to replace advice given to you by your health care provider. Make sure you discuss any questions you have with your health care provider. Document Released: 09/11/2006 Document Revised: 05/24/2015 Document Reviewed: 01/16/2015 [employer] Interactive Patient Education  [employer].

## 2017-05-26 NOTE — Progress Notes (Signed)
Patient's Name: Paula Duncan  MRN: [id number]  Referring Provider: [name], FNP  DOB: Mar 31, 1961  PCP: [name], FNP  DOS: 05/27/2017  Note by: Gaspar Cola, MD  Service setting: Ambulatory outpatient  Specialty: Interventional Pain Management  Patient type: Established  Location: [facility] (AMB) Pain Management Facility  Visit type: Interventional Procedure   Primary Reason for Visit: Interventional Pain Management Treatment. CC: Back Pain (lower)  Procedure:  Anesthesia, Analgesia, Anxiolysis:  Procedure #1: Type: Diagnostic Medial Branch Facet Block #4  Region: Lumbar Level: L2, L3, L4, L5, & S1 Medial Branch Level(s) Laterality: Bilateral  Procedure #2: Type: Diagnostic Sacroiliac Joint Block #4  Region: Posterior Lumbosacral Level: PSIS (Posterior Superior Iliac Spine) Sacroiliac Joint Laterality: Bilateral  Type: Local Anesthesia with Moderate (Conscious) Sedation Local Anesthetic: Lidocaine 1% Route: Intravenous (IV) IV Access: Secured Sedation: Meaningful verbal contact was maintained at all times during the procedure  Indication(s): Analgesia and Anxiety   Indications: 1. Lumbar facet syndrome (Primary Source of Pain) (Bilateral) (R>L)   2. 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 Chronic low back pain (Primary Source of Pain) (Bilateral) (R>L)   4. Lumbar spondylosis   5. Facet syndrome, lumbar   6. Chronic sacroiliac joint pain   7. Chronic bilateral low back pain without sciatica   8. Spondylosis without myelopathy or radiculopathy, lumbar region    Pain Score: Pre-procedure: 4 /10 Post-procedure: 0-No pain/10  Pre-op Assessment:  Paula Duncan is a 57 y.o. (year old), female patient, seen today for interventional treatment. She  has a past surgical history that includes Spine surgery (1994); Abdominal hysterectomy; Exploratory laparotomy; Cesarean section; Esophagogastroduodenoscopy (07/12/09); and left heart catheterization with coronary angiogram  (N/A, 04/06/2013). Paula Duncan has a current medication list which includes the following prescription(s): alendronate, benzonatate, calcium carbonate, vitamin d3, cyanocobalamin, cyclobenzaprine, gabapentin, guaifenesin-codeine, oxycodone, oxycodone, oxycodone, potassium chloride sa, pravastatin, promethazine, flutter, aerochamber mv, spiriva respimat, trazodone, vitamin c, and zolpidem, and the following Facility-Administered Medications: fentanyl and midazolam. Her primarily concern today is the Back Pain (lower)  Initial Vital Signs: See below BMI: Estimated body mass index is 21.63 kg/m as calculated from the following:   Height as of this encounter: 5\' 4"  (1.626 m).   Weight as of this encounter: 126 lb (57.2 kg).  Risk Assessment: Allergies: Reviewed. She is allergic to nsaids; tramadol hcl; chantix [varenicline]; and metronidazole.  Allergy Precautions: None required Coagulopathies: Reviewed. None identified.  Blood-thinner therapy: None at this time Active Infection(s): Reviewed. None identified. Paula Duncan is afebrile  Site Confirmation: Paula Duncan was asked to confirm the procedure and laterality before marking the site Procedure checklist: Completed Consent: Before the procedure and under the influence of no sedative(s), amnesic(s), or anxiolytics, the patient was informed of the treatment options, risks and possible complications. To fulfill our ethical and legal obligations, as recommended by the American Medical Association's Code of Ethics, I have informed the patient of my clinical impression; the nature and purpose of the treatment or procedure; the risks, benefits, and possible complications of the intervention; the alternatives, including doing nothing; the risk(s) and benefit(s) of the alternative treatment(s) or procedure(s); and the risk(s) and benefit(s) of doing nothing. The patient was provided information about the general risks and possible complications associated with  the procedure. These may include, but are not limited to: failure to achieve desired goals, infection, bleeding, organ or nerve damage, allergic reactions, paralysis, and death. In addition, the patient was informed of those risks and complications associated to  Spine-related procedures, such as failure to decrease pain; infection (i.e.: Meningitis, epidural or intraspinal abscess); bleeding (i.e.: epidural hematoma, subarachnoid hemorrhage, or any other type of intraspinal or peri-dural bleeding); organ or nerve damage (i.e.: Any type of peripheral nerve, nerve root, or spinal cord injury) with subsequent damage to sensory, motor, and/or autonomic systems, resulting in permanent pain, numbness, and/or weakness of one or several areas of the body; allergic reactions; (i.e.: anaphylactic reaction); and/or death. Furthermore, the patient was informed of those risks and complications associated with the medications. These include, but are not limited to: allergic reactions (i.e.: anaphylactic or anaphylactoid reaction(s)); adrenal axis suppression; blood sugar elevation that in diabetics may result in ketoacidosis or comma; water retention that in patients with history of congestive heart failure may result in shortness of breath, pulmonary edema, and decompensation with resultant heart failure; weight gain; swelling or edema; medication-induced neural toxicity; particulate matter embolism and blood vessel occlusion with resultant organ, and/or nervous system infarction; and/or aseptic necrosis of one or more joints. Finally, the patient was informed that Medicine is not an exact science; therefore, there is also the possibility of unforeseen or unpredictable risks and/or possible complications that may result in a catastrophic outcome. The patient indicated having understood very clearly. We have given the patient no guarantees and we have made no promises. Enough time was given to the patient to ask questions, all  of which were answered to the patient's satisfaction. Paula Duncan has indicated that she wanted to continue with the procedure. Attestation: I, the ordering provider, attest that I have discussed with the patient the benefits, risks, side-effects, alternatives, likelihood of achieving goals, and potential problems during recovery for the procedure that I have provided informed consent. Date: 05/27/2017; Time: 9:17 AM  Pre-Procedure Preparation:  Monitoring: As per clinic protocol. Respiration, ETCO2, SpO2, BP, heart rate and rhythm monitor placed and checked for adequate function Safety Precautions: Patient was assessed for positional comfort and pressure points before starting the procedure. Time-out: I initiated and conducted the "Time-out" before starting the procedure, as per protocol. The patient was asked to participate by confirming the accuracy of the "Time Out" information. Verification of the correct person, site, and procedure were performed and confirmed by me, the nursing staff, and the patient. "Time-out" conducted as per Joint Commission's Universal Protocol (UP.01.01.01). "Time-out" Date & Time: 05/27/2017; 1108 hrs.  Description of Procedure #1 Process:   Time-out: "Time-out" completed before starting procedure, as per protocol. Position: Prone Target Area: For Lumbar Facet blocks, the target is the groove formed by the junction of the transverse process and superior articular process. For the L5 dorsal ramus, the target is the notch between superior articular process and sacral ala. For the S1 dorsal ramus, the target is the superior and lateral edge of the posterior S1 Sacral foramen. Approach: Paramedial approach. Area Prepped: Entire Posterior Lumbosacral Region Prepping solution: ChloraPrep (2% chlorhexidine gluconate and 70% isopropyl alcohol) Safety Precautions: Aspiration looking for blood return was conducted prior to all injections. At no point did we inject any substances,  as a needle was being advanced. No attempts were made at seeking any paresthesias. Safe injection practices and needle disposal techniques used. Medications properly checked for expiration dates. SDV (single dose vial) medications used.  Description of the Procedure: Protocol guidelines were followed. The patient was placed in position over the fluoroscopy table. The target area was identified and the area prepped in the usual manner. Skin desensitized using vapocoolant spray. Skin & deeper tissues infiltrated  with local anesthetic. Appropriate amount of time allowed to pass for local anesthetics to take effect. The procedure needle was introduced through the skin, ipsilateral to the reported pain, and advanced to the target area. Employing the "Medial Branch Technique", the needles were advanced to the angle made by the superior and medial portion of the transverse process, and the lateral and inferior portion of the superior articulating process of the targeted vertebral bodies. This area is known as "Burton's Eye" or the "Eye of the [place] Dog". A procedure needle was introduced through the skin, and this time advanced to the angle made by the superior and medial border of the sacral ala, and the lateral border of the S1 vertebral body. This last needle was later repositioned at the superior and lateral border of the posterior S1 foramen. Negative aspiration confirmed. Solution injected in intermittent fashion, asking for systemic symptoms every 0.5cc of injectate. The needles were then removed and the area cleansed, making sure to leave some of the prepping solution back to take advantage of its long term bactericidal properties. Start Time: 1108 hrs. Materials:  Needle(s) Type: Regular needle Gauge: 22G Length: 3.5-in Medication(s): We administered midazolam, fentaNYL, lactated ringers, lidocaine, ropivacaine (PF) 2 mg/mL (0.2%), triamcinolone acetonide, ropivacaine (PF) 2 mg/mL (0.2%), triamcinolone  acetonide, ropivacaine (PF) 2 mg/mL (0.2%), and methylPREDNISolone acetate. Please see chart orders for dosing details.  Description of Procedure # 2 Process:   Position: Prone Target Area: For upper sacroiliac joint block(s), the target is the superior and posterior margin of the sacroiliac joint. Approach: Ipsilateral approach. Area Prepped: Entire Posterior Lumbosacral Region Prepping solution: ChloraPrep (2% chlorhexidine gluconate and 70% isopropyl alcohol) Safety Precautions: Aspiration looking for blood return was conducted prior to all injections. At no point did we inject any substances, as a needle was being advanced. No attempts were made at seeking any paresthesias. Safe injection practices and needle disposal techniques used. Medications properly checked for expiration dates. SDV (single dose vial) medications used. Description of the Procedure: Protocol guidelines were followed. The patient was placed in position over the fluoroscopy table. The target area was identified and the area prepped in the usual manner. Skin desensitized using vapocoolant spray. Skin & deeper tissues infiltrated with local anesthetic. Appropriate amount of time allowed to pass for local anesthetics to take effect. The procedure needle was advanced under fluoroscopic guidance into the sacroiliac joint until a firm endpoint was obtained. Proper needle placement secured. Negative aspiration confirmed. Solution injected in intermittent fashion, asking for systemic symptoms every 0.5cc of injectate. The needles were then removed and the area cleansed, making sure to leave some of the prepping solution back to take advantage of its long term bactericidal properties. Vitals:   05/27/17 1122 05/27/17 1132 05/27/17 1142 05/27/17 1152  BP: 116/68 127/60 137/65 (!) 143/67  Pulse:      Resp: 12 10 10 16   Temp:  (!) 97.4 F (36.3 C)  (!) 97 F (36.1 C)  TempSrc:      SpO2: 100% 98% 98% 99%  Weight:      Height:         End Time: 1121 hrs. Materials:  Needle(s) Type: Regular needle Gauge: 22G Length: 3.5-in Medication(s): We administered midazolam, fentaNYL, lactated ringers, lidocaine, ropivacaine (PF) 2 mg/mL (0.2%), triamcinolone acetonide, ropivacaine (PF) 2 mg/mL (0.2%), triamcinolone acetonide, ropivacaine (PF) 2 mg/mL (0.2%), and methylPREDNISolone acetate. Please see chart orders for dosing details.  Imaging Guidance (Spinal):  Type of Imaging Technique: Fluoroscopy Guidance (Spinal) Indication(s): Assistance  in needle guidance and placement for procedures requiring needle placement in or near specific anatomical locations not easily accessible without such assistance. Exposure Time: Please see nurses notes. Contrast: None used. Fluoroscopic Guidance: I was personally present during the use of fluoroscopy. "Tunnel Vision Technique" used to obtain the best possible view of the target area. Parallax error corrected before commencing the procedure. "Direction-depth-direction" technique used to introduce the needle under continuous pulsed fluoroscopy. Once target was reached, antero-posterior, oblique, and lateral fluoroscopic projection used confirm needle placement in all planes. Images permanently stored in EMR. Interpretation: No contrast injected. I personally interpreted the imaging intraoperatively. Adequate needle placement confirmed in multiple planes. Permanent images saved into the patient's record.  Antibiotic Prophylaxis:  Indication(s): None identified Antibiotic given: None  Post-operative Assessment:  EBL: None Complications: No immediate post-treatment complications observed by team, or reported by patient. Note: The patient tolerated the entire procedure well. A repeat set of vitals were taken after the procedure and the patient was kept under observation following institutional policy, for this type of procedure. Post-procedural neurological assessment was performed, showing return to  baseline, prior to discharge. The patient was provided with post-procedure discharge instructions, including a section on how to identify potential problems. Should any problems arise concerning this procedure, the patient was given instructions to immediately contact us, at any time, without hesitation. In any case, we plan to contact the patient by telephone for a follow-up status report regarding this interventional procedure. Comments:  No additional relevant information.  Plan of Care    Imaging Orders     DG C-Arm 1-60 Min-No Report  Procedure Orders     LUMBAR FACET(MEDIAL BRANCH NERVE BLOCK) MBNB     SACROILIAC JOINT INJECTION  Medications ordered for procedure: Meds ordered this encounter  Medications  . midazolam (VERSED) 5 MG/5ML injection 1-2 mg    Make sure Flumazenil is available in the pyxis when using this medication. If oversedation occurs, administer 0.2 mg IV over 15 sec. If after 45 sec no response, administer 0.2 mg again over 1 min; may repeat at 1 min intervals; not to exceed 4 doses (1 mg)  . fentaNYL (SUBLIMAZE) injection 25-50 mcg    Make sure Narcan is available in the pyxis when using this medication. In the event of respiratory depression (RR< 8/min): Titrate NARCAN (naloxone) in increments of 0.1 to 0.2 mg IV at 2-3 minute intervals, until desired degree of reversal.  . lactated ringers infusion 1,000 mL  . lidocaine (XYLOCAINE) 2 % (with pres) injection 200 mg  . ropivacaine (PF) 2 mg/mL (0.2%) (NAROPIN) injection 9 mL  . triamcinolone acetonide (KENALOG-40) injection 40 mg  . ropivacaine (PF) 2 mg/mL (0.2%) (NAROPIN) injection 9 mL  . triamcinolone acetonide (KENALOG-40) injection 40 mg  . ropivacaine (PF) 2 mg/mL (0.2%) (NAROPIN) injection 9 mL  . methylPREDNISolone acetate (DEPO-MEDROL) injection 80 mg   Medications administered: We administered midazolam, fentaNYL, lactated ringers, lidocaine, ropivacaine (PF) 2 mg/mL (0.2%), triamcinolone acetonide,  ropivacaine (PF) 2 mg/mL (0.2%), triamcinolone acetonide, ropivacaine (PF) 2 mg/mL (0.2%), and methylPREDNISolone acetate.  See the medical record for exact dosing, route, and time of administration.  New Prescriptions   No medications on file   Disposition: Discharge home  Discharge Date & Time: 05/27/2017; 1200 hrs.   Physician-requested Follow-up: Return for post-procedure eval (2 wks), w/ Dr. Dossie Arbour.  Future Appointments  Date Time Provider [place]  06/02/2017  9:00 AM Magdalen Spatz, NP [facility] None  06/09/2017 10:15 AM Milinda Pointer,  MD [facility] None  08/11/2017  9:00 AM Vevelyn Francois, NP [facility] None   Primary Care Physician: [name], FNP Location: [facility] Note by: Gaspar Cola, MD Date: 05/27/2017; Time: 2:22 PM  Disclaimer:  Medicine is not an Chief Strategy Officer. The only guarantee in medicine is that nothing is guaranteed. It is important to note that the decision to proceed with this intervention was based on the information collected from the patient. The Data and conclusions were drawn from the patient's questionnaire, the interview, and the physical examination. Because the information was provided in large part by the patient, it cannot be guaranteed that it has not been purposely or unconsciously manipulated. Every effort has been made to obtain as much relevant data as possible for this evaluation. It is important to note that the conclusions that lead to this procedure are derived in large part from the available data. Always take into account that the treatment will also be dependent on availability of resources and existing treatment guidelines, considered by other Pain Management Practitioners as being common knowledge and practice, at the time of the intervention. For Medico-Legal purposes, it is also important to point out that variation in procedural techniques and pharmacological choices are the acceptable  norm. The indications, contraindications, technique, and results of the above procedure should only be interpreted and judged by a Board-Certified Interventional Pain Specialist with extensive familiarity and expertise in the same exact procedure and technique.

## 2017-05-27 ENCOUNTER — Ambulatory Visit (HOSPITAL_BASED_OUTPATIENT_CLINIC_OR_DEPARTMENT_OTHER): Payer: Medicare Other | Admitting: Pain Medicine

## 2017-05-27 ENCOUNTER — Encounter: Payer: Self-pay | Admitting: Pain Medicine

## 2017-05-27 ENCOUNTER — Ambulatory Visit
Admission: RE | Admit: 2017-05-27 | Discharge: 2017-05-27 | Disposition: A | Discharge status: Home / Self Care | Payer: Medicare Other | Source: Ambulatory Visit | Attending: Pain Medicine | Admitting: Pain Medicine

## 2017-05-27 ENCOUNTER — Other Ambulatory Visit: Payer: Self-pay

## 2017-05-27 VITALS — BP 143/67 | HR 107 | Temp 97.0°F | Resp 16 | Ht 64.0 in | Wt 126.0 lb

## 2017-05-27 DIAGNOSIS — G8929 Other chronic pain: Secondary | ICD-10-CM | POA: Diagnosis not present

## 2017-05-27 DIAGNOSIS — M533 Sacrococcygeal disorders, not elsewhere classified: Secondary | ICD-10-CM

## 2017-05-27 DIAGNOSIS — M545 Low back pain: Secondary | ICD-10-CM

## 2017-05-27 DIAGNOSIS — Z79899 Other long term (current) drug therapy: Secondary | ICD-10-CM | POA: Insufficient documentation

## 2017-05-27 DIAGNOSIS — M47816 Spondylosis without myelopathy or radiculopathy, lumbar region: Secondary | ICD-10-CM | POA: Insufficient documentation

## 2017-05-27 DIAGNOSIS — Z886 Allergy status to analgesic agent status: Secondary | ICD-10-CM | POA: Diagnosis not present

## 2017-05-27 MED ORDER — METHYLPREDNISOLONE ACETATE 80 MG/ML IJ SUSP
INTRAMUSCULAR | Status: AC
  Filled 2017-05-27: qty 1

## 2017-05-27 MED ORDER — LACTATED RINGERS IV SOLN
1000.0000 mL | Freq: Once | INTRAVENOUS | Status: AC
  Administered 2017-05-27: 1000 mL via INTRAVENOUS

## 2017-05-27 MED ORDER — FENTANYL CITRATE (PF) 100 MCG/2ML IJ SOLN
25.0000 ug | INTRAMUSCULAR | Status: DC | PRN
  Administered 2017-05-27: 100 ug via INTRAVENOUS

## 2017-05-27 MED ORDER — ROPIVACAINE HCL 2 MG/ML IJ SOLN
9.0000 mL | Freq: Once | INTRAMUSCULAR | Status: AC
  Administered 2017-05-27: 10 mL via PERINEURAL

## 2017-05-27 MED ORDER — LIDOCAINE HCL 2 % IJ SOLN
10.0000 mL | Freq: Once | INTRAMUSCULAR | Status: AC
  Administered 2017-05-27: 400 mg

## 2017-05-27 MED ORDER — METHYLPREDNISOLONE ACETATE 80 MG/ML IJ SUSP
80.0000 mg | Freq: Once | INTRAMUSCULAR | Status: AC
  Administered 2017-05-27: 80 mg via INTRA_ARTICULAR

## 2017-05-27 MED ORDER — LIDOCAINE HCL 2 % IJ SOLN
INTRAMUSCULAR | Status: AC
  Filled 2017-05-27: qty 20

## 2017-05-27 MED ORDER — ROPIVACAINE HCL 2 MG/ML IJ SOLN
INTRAMUSCULAR | Status: AC
  Filled 2017-05-27: qty 30

## 2017-05-27 MED ORDER — TRIAMCINOLONE ACETONIDE 40 MG/ML IJ SUSP
40.0000 mg | Freq: Once | INTRAMUSCULAR | Status: AC
  Administered 2017-05-27: 40 mg

## 2017-05-27 MED ORDER — MIDAZOLAM HCL 5 MG/5ML IJ SOLN
INTRAMUSCULAR | Status: AC
  Filled 2017-05-27: qty 5

## 2017-05-27 MED ORDER — ROPIVACAINE HCL 2 MG/ML IJ SOLN
9.0000 mL | Freq: Once | INTRAMUSCULAR | Status: AC
  Administered 2017-05-27: 10 mL via INTRA_ARTICULAR

## 2017-05-27 MED ORDER — FENTANYL CITRATE (PF) 100 MCG/2ML IJ SOLN
INTRAMUSCULAR | Status: AC
  Filled 2017-05-27: qty 2

## 2017-05-27 MED ORDER — TRIAMCINOLONE ACETONIDE 40 MG/ML IJ SUSP
INTRAMUSCULAR | Status: AC
  Filled 2017-05-27: qty 2

## 2017-05-27 MED ORDER — MIDAZOLAM HCL 5 MG/5ML IJ SOLN
1.0000 mg | INTRAMUSCULAR | Status: DC | PRN
  Administered 2017-05-27: 3 mg via INTRAVENOUS

## 2017-05-27 NOTE — Progress Notes (Signed)
Safety precautions to be maintained throughout the outpatient stay will include: orient to surroundings, keep bed in low position, maintain call bell within reach at all times, provide assistance with transfer out of bed and ambulation.  

## 2017-05-27 NOTE — Patient Instructions (Signed)

## 2017-05-28 ENCOUNTER — Telehealth: Payer: Self-pay | Admitting: *Deleted

## 2017-05-28 NOTE — Telephone Encounter (Signed)
Having increased pain following procedure yesterday. Denies fever, shortness of breath, or loss of control of bowel or bladder. Advised that it is normal to have continued or increase in pain for few days following procedure. Offered appointment to see Crystal, does not wish to do that. Advised to call if any of above symptoms occur.

## 2017-06-02 ENCOUNTER — Ambulatory Visit (INDEPENDENT_AMBULATORY_CARE_PROVIDER_SITE_OTHER): Payer: Medicare Other | Admitting: Acute Care

## 2017-06-02 ENCOUNTER — Encounter: Payer: Self-pay | Admitting: Acute Care

## 2017-06-02 DIAGNOSIS — Z87891 Personal history of nicotine dependence: Secondary | ICD-10-CM

## 2017-06-02 DIAGNOSIS — J84115 Respiratory bronchiolitis interstitial lung disease: Secondary | ICD-10-CM | POA: Diagnosis not present

## 2017-06-02 MED ORDER — GUAIFENESIN-CODEINE 100-10 MG/5ML PO SYRP: 5.0000 mL | ORAL_SOLUTION | Freq: Four times a day (QID) | ORAL | 0 refills | Status: DC | PRN

## 2017-06-02 MED ORDER — FLUTTER DEVI: 0 refills | Status: AC

## 2017-06-02 NOTE — Assessment & Plan Note (Signed)
I have spent 3 minutes counseling patient on smoking cessation this visit. She understands and accepts the risks of continued tobacco abuse to her health

## 2017-06-02 NOTE — Addendum Note (Signed)
Addended by: Jannette Spanner on: 06/02/2017 09:48 AM   Modules accepted: Orders

## 2017-06-02 NOTE — Patient Instructions (Addendum)
It is nice to meet you today. Continue Spiriva Respimat 2 puffs once daily Flutter valve for airway clearance Tessalon Perles for airway clearance and cough Continue using Flutter valve  Robitussin AC 5 cc's at bedtime only for cough. Do not use any other time of the day. Do not use with pain medicine Do not use with muscle relaxant. Do not drive if sleepy, as this medication can make you sleepy. Try Prilosec or its generic once daily for reflux Reassign to Dr. Melvyn Novas. Follow up with Dr. Melvyn Novas as needed. Please contact office for sooner follow up if symptoms do not improve or worsen or seek emergency care

## 2017-06-02 NOTE — Progress Notes (Signed)
History of Present Illness Paula Duncan is a 57 y.o. female current smoker with RB ILD. She is a previous patient of Dr. Ashok Cordia.  HPI  RB ILD:  Previously had improvement in cough while on Symbicort. Major complaint at last appointment was feeling as though she could not take a deep breath. Previously also on Spiriva Respimat with some questionable symptomatic improvement. Advised at last appointment to increase Spiriva to 2 inhalations once daily. She reports her dyspnea has worsened with her weight gain. She is still coughing intermittently. She is using her Spiriva Respimat & flutter valve for airway clearance. She is using Best boy with intermittent relief of her cough.   06/02/2017 Acute OV:Pt. Presents today with cough.She states the cough got worse afew months ago, but now she states it has become so bad at night she cannot catch her breath.  She states her sputum is thich, but she is unaware of the color of the sputum. She sleeps propped up.She does take oxycodone for chronic pain. She is also on muscle relaxer also.She states she has tried Mucinex and Delsym with no result.She states she knows her cough is from smoking.She states she is working on quitting. She does not want to try Prilosec for possible GERD. She wants cough syrup with codeine.Cough is non-productive. She denies any fever, chest pain, orthopnea or hemoptysis.   Test Results: PFT 10/10/16: FVC 2.53 L (75%) FEV1 1.91 L (73%) FEV1/FVC 0.75 FEF 25-75 1.50 L (61%) no bronchodilator response 02/20/16: FVC 2.67 L (79%) FEV1 1.96 L (74%) FEV1/FVC 0.73 FEF 25-75 1.43 L (58%) no bronchodilator response                                                                    DLCO corrected 65% (Hgb 12.0) 09/14/15: FVC 2.60 L (76%) FEV1 1.98 L (75%) FEV1/FVC 0.76 FEF 25-75 1.57 L (63%) no bronchodilator response TLC 4.84 L (95%) RV 116% ERV 79% DLCO corrected 58% (hemoglobin 12.0) 07/20/15: FVC 2.51 L (79%) FEV1 1.87 L (73%) FEV1/FVC  0.75 FEF 25-75 1.53 L (59%) 02/08/13: FVC 2.51 L (80%) FEV1 1.91 L (74%) FEV1/FVC 0.75 FEF 25-75 1.51 L (53%)  6MWT 02/20/16:  Walked 521 meters / Baseline Sat 99% on RA / Nadir Sat 99% on RA @ rest 09/14/15:  Walked 385 meters / Baseline Sat 97% on RA / Nadir Sat 97% on RA @ rest  IMAGING CXR PA/LAT 10/15/16 (personally reviewed by me):  No opacity or mass appreciated. No pleural effusion. Heart normal in size & mediastinum normal in contour.   HRCT CHEST W/O 09/22/15 (previously reviewed by me): Mild air trapping. No pathologic mediastinal adenopathy. No pericardial effusion, pleural effusion, or pleural thickening. Diffuse centrilobular groundglass attenuation & micro-nodularities consistent with respiratory bronchiolitis.  CXR PA/LAT 07/20/15 (previously reviewed by me):  No focal opacity or effusion. Mild hyperinflation. Normal heart size. Normal mediastinal contour.   CT CHEST W/ CONTRAST 02/20/10 (previously reviewed by me): No pleural effusion or thickening appreciated except for possible mild apical thickening. No pericardial effusion. No pathologic mediastinal adenopathy. No parenchymal opacity or nodule appreciated.  MICROBIOLOGY Tracheal Aspirate Ctx (11/03/08): Negative BAL (11/03/08): Oral Flora / AFB Negative / Fungal Negative  PATHOLOGY BAL (11/03/08): Reactive Epithelial Cells  LABS 07/07/14  CBC: 7.0/12.6/39.0/225 BMP: 141/4.3/108/29/7/0.88/83/9.2  11/27/10 HIV: Negative   11/07/08 ANA: Negative RF: <20 C3: 114 C4: 20  11/03/08 BAL Cell Count: WBC 400 (3% lymph, 9% eos, 84% neutro, 4% macrophages)       CBC Latest Ref Rng & Units 07/07/2014 04/02/2013 11/27/2010  WBC 4.0 - 10.5 K/uL 7.0 8.1 9.2  Hemoglobin 12.0 - 15.0 g/dL 12.6 14.4 15.4(H)  Hematocrit 36.0 - 46.0 % 39.0 42.9 46.7(H)  Platelets 150 - 400 K/uL 225 260.0 346    BMP Latest Ref Rng & Units 12/16/2016 12/21/2015 07/07/2014  Glucose 65 - 99 mg/dL 72 62(L) 83  BUN 6 - 24 mg/dL 7 16 7     Creatinine 0.57 - 1.00 mg/dL 0.93 1.14(H) 0.88  BUN/Creat Ratio 9 - 23 8(L) - -  Sodium 134 - 144 mmol/L 141 140 141  Potassium 3.5 - 5.2 mmol/L 4.5 4.2 4.3  Chloride 96 - 106 mmol/L 105 106 108  CO2 20 - 29 mmol/L 22 28 29   Calcium 8.7 - 10.2 mg/dL 9.1 9.1 9.2     ProBNP    Component Value Date/Time   PROBNP 5.6 05/03/2009 2025    PFT    Component Value Date/Time   FEV1PRE 1.91 10/10/2016 1001   FEV1POST 2.08 10/10/2016 1001   FVCPRE 2.53 10/10/2016 1001   FVCPOST 2.57 10/10/2016 1001   TLC 4.84 09/14/2015 1357   DLCOUNC 15.18 02/20/2016 0847   PREFEV1FVCRT 75 10/10/2016 1001   PSTFEV1FVCRT 81 10/10/2016 1001    Dg C-arm 1-60 Min-no Report  Result Date: 05/27/2017 Fluoroscopy was utilized by the requesting physician.  No radiographic interpretation.     Past medical hx Past Medical History:  Diagnosis Date  . Anorexia   . Chest pain    a. 12/2009 MV ([facility]): Neg;  b. 03/2013 MV: small, mild reversible defect in the distal PDA, EF 60%.  . Chronic back pain   . 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 bilateral LE paralysis, resolved;  b. neg neuro w/u for sz in 2010  . COPD (chronic obstructive pulmonary disease) (HCC)    a. nl PFT's in 2014  . GERD (gastroesophageal reflux disease)   . H/O sleep apnea    a. 2010: sleep study neg for osa/cataplexy/narcolepsy  . History of tobacco abuse    a. 30 pack year hx, quit 2013.  Marland Kitchen Hx of migraines   . Hyperlipidemia   . Hypertension   . Melena    a. nl EGD  . Osteoporosis   . PVC (premature ventricular contraction)    a. 2012 Holter: sinus tach and pvc's assoc with dizziness.  . Vitamin D deficiency      Social History   Tobacco Use  . Smoking status: Current Every Day Smoker    Packs/day: 5.00    Years: 44.00    Pack years: 220.00    Types: Cigarettes    Start date: 07/23/1971  . Smokeless tobacco: Current User  . Tobacco comment: started smoking again  Substance Use Topics  . Alcohol  use: No    Alcohol/week: 0.0 oz  . Drug use: No    Paula Duncan reports that she has been smoking cigarettes.  She started smoking about 45 years ago. She has a 220.00 pack-year smoking history. She uses smokeless tobacco. She reports that she does not drink alcohol or use drugs.  Tobacco Cessation: I have spent 3 minutes counseling patient on smoking cessation this visit.  Past surgical hx, Family hx,  Social hx all reviewed.  Current Outpatient Medications on File Prior to Visit  Medication Sig  . alendronate (FOSAMAX) 70 MG tablet 70 mg once a week.   . benzonatate (TESSALON) 100 MG capsule TAKE ONE OR TWO CAPSULES BY MOUTH THREE TIMES A DAY AS DIRECTED FOR COUGH.  . calcium carbonate (OSCAL) 1500 (600 Ca) MG TABS tablet Take by mouth 2 (two) times daily with a meal.  . Cholecalciferol (VITAMIN D3) 5000 units TABS Take 5,000 Units by mouth daily.  . cyanocobalamin (V-R VITAMIN B-12) 500 MCG tablet Take 500 mcg by mouth daily.   . cyclobenzaprine (FLEXERIL) 10 MG tablet Take 1 tablet (10 mg total) by mouth 3 (three) times daily as needed for muscle spasms.  Marland Kitchen gabapentin (NEURONTIN) 100 MG capsule Take 2 capsules (200 mg total) by mouth 2 (two) times daily.  Marland Kitchen guaiFENesin-codeine (CHERATUSSIN AC) 100-10 MG/5ML syrup Take 5 mLs by mouth every 6 (six) hours as needed for cough.  Derrill Memo ON 07/21/2017] oxyCODONE (OXY IR/ROXICODONE) 5 MG immediate release tablet Take 1 tablet (5 mg total) by mouth every 8 (eight) hours as needed for severe pain.  Derrill Memo ON 06/21/2017] oxyCODONE (OXY IR/ROXICODONE) 5 MG immediate release tablet Take 1 tablet (5 mg total) by mouth every 8 (eight) hours as needed for severe pain.  Marland Kitchen oxyCODONE (OXY IR/ROXICODONE) 5 MG immediate release tablet Take 1 tablet (5 mg total) by mouth every 8 (eight) hours as needed for severe pain.  . potassium chloride SA (K-DUR,KLOR-CON) 20 MEQ tablet Take 20 mEq by mouth.   . pravastatin (PRAVACHOL) 20 MG tablet   . promethazine  (PHENERGAN) 25 MG tablet Take 1 tablet (25 mg total) by mouth every 8 (eight) hours as needed for nausea.  Marland Kitchen Respiratory Therapy Supplies (FLUTTER) DEVI Use as directed.  Marland Kitchen Spacer/Aero-Holding Chambers (AEROCHAMBER MV) inhaler Use as instructed with Symibcort  . SPIRIVA RESPIMAT 2.5 MCG/ACT AERS PLACE 1 CAPSULE INTO INHALER AND INHALE ONCE DAILY AS DIRECTED  . traZODone (DESYREL) 100 MG tablet Take 1 tablet (100 mg total) by mouth at bedtime.  . vitamin C (ASCORBIC ACID) 500 MG tablet Take 500 mg by mouth daily.  Marland Kitchen zolpidem (AMBIEN) 5 MG tablet Take 5 mg by mouth at bedtime as needed.    No current facility-administered medications on file prior to visit.      Allergies  Allergen Reactions  . Nsaids Other (See Comments)    REACTION: Gets "Black and Blue" - easy bruising  . Tramadol Hcl Other (See Comments)    "Black and blue"  . Chantix [Varenicline] Other (See Comments)    Nausea, Dysgeusia  . Metronidazole Other (See Comments) and Itching    "caused dots to appear on legs"    Review Of Systems:  Constitutional:   No  weight loss, night sweats,  Fevers, chills, fatigue, or  lassitude.  HEENT:   No headaches,  Difficulty swallowing,  Tooth/dental problems, or  Sore throat,                No sneezing, itching, ear ache, nasal congestion, post nasal drip,   CV:  No chest pain,  Orthopnea, PND, swelling in lower extremities, anasarca, dizziness, palpitations, syncope.   GI  No heartburn, indigestion, abdominal pain, nausea, vomiting, diarrhea, change in bowel habits, loss of appetite, bloody stools.   Resp: + shortness of breath with exertion or at rest.  No excess mucus, no productive cough,  + non-productive cough,  No coughing up of blood.  No change in color of mucus.  No wheezing.  No chest wall deformity  Skin: no rash or lesions.  GU: no dysuria, change in color of urine, no urgency or frequency.  No flank pain, no hematuria   MS:  No joint pain or swelling.  No decreased  range of motion.  No back pain.  Psych:  No change in mood or affect. No depression or anxiety.  No memory loss.   Vital Signs BP 122/78 (BP Location: Left Arm, Cuff Size: Normal)   Pulse (!) 117   Ht 5\' 4"  (1.626 m)   Wt 124 lb (56.2 kg)   SpO2 100%   BMI 21.28 kg/m    Physical Exam:  General- No distress,  A&Ox3, smells of cigarette smoke ENT: No sinus tenderness, TM clear, pale nasal mucosa, no oral exudate,no post nasal drip, no LAN Cardiac: S1, S2, regular rate and rhythm, no murmur Chest: No wheeze/ rales/ dullness; no accessory muscle use, no nasal flaring, no sternal retractions Abd.: Soft Non-tender, non-distended, BS+ Ext: No clubbing cyanosis, edema Neuro:  normal strength, MAE x 4, A&O x 3 Skin: No rashes, warm and dry Psych: normal mood and behavior   Assessment/Plan  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 Do not use any other time of the day. Do not use with pain medicine Do not use with muscle relaxant. Do not drive if sleepy, as this medication can make you sleepy. Try Prilosec or its generic once daily for reflux Reassign to Dr. Melvyn Novas. Follow up with Dr. Melvyn Novas as needed. Please contact office for sooner follow up if symptoms do not improve or worsen or seek emergency care    History of tobacco use  I have spent 3 minutes counseling patient on smoking cessation this visit. She understands and accepts the risks of continued tobacco abuse to her health    Magdalen Spatz, NP 06/02/2017  9:30 AM

## 2017-06-02 NOTE — Progress Notes (Signed)
Chart and office note reviewed in detail  > agree with a/p as outlined    

## 2017-06-02 NOTE — Assessment & Plan Note (Signed)
Worsening cough with weather changes Continues to smoke Plan: Continue Spiriva Respimat 2 puffs once daily Flutter valve for airway clearance Tessalon Perles for airway clearance and cough Continue using Flutter valve  Robitussin AC 5 cc's at bedtime only for cough. Do not use any other time of the day. Do not use with pain medicine Do not use with muscle relaxant. Do not drive if sleepy, as this medication can make you sleepy. Try Prilosec or its generic once daily for reflux Reassign to Dr. Melvyn Novas. Follow up with Dr. Melvyn Novas as needed. Please contact office for sooner follow up if symptoms do not improve or worsen or seek emergency care

## 2017-06-08 DIAGNOSIS — G8929 Other chronic pain: Secondary | ICD-10-CM | POA: Insufficient documentation

## 2017-06-08 DIAGNOSIS — M25552 Pain in left hip: Secondary | ICD-10-CM

## 2017-06-08 DIAGNOSIS — M25551 Pain in right hip: Secondary | ICD-10-CM
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
## 2017-06-09 ENCOUNTER — Other Ambulatory Visit: Payer: Self-pay

## 2017-06-09 ENCOUNTER — Ambulatory Visit: Payer: Medicare Other | Attending: Pain Medicine | Admitting: Pain Medicine

## 2017-06-09 ENCOUNTER — Encounter: Payer: Self-pay | Admitting: Pain Medicine

## 2017-06-09 VITALS — BP 131/77 | HR 106 | Temp 98.0°F | Ht 64.0 in | Wt 124.0 lb

## 2017-06-09 DIAGNOSIS — M25551 Pain in right hip: Secondary | ICD-10-CM | POA: Insufficient documentation

## 2017-06-09 DIAGNOSIS — Z79899 Other long term (current) drug therapy: Secondary | ICD-10-CM | POA: Diagnosis not present

## 2017-06-09 DIAGNOSIS — M25552 Pain in left hip: Secondary | ICD-10-CM

## 2017-06-09 DIAGNOSIS — M961 Postlaminectomy syndrome, not elsewhere classified: Secondary | ICD-10-CM | POA: Insufficient documentation

## 2017-06-09 DIAGNOSIS — F17210 Nicotine dependence, cigarettes, uncomplicated: Secondary | ICD-10-CM | POA: Diagnosis not present

## 2017-06-09 DIAGNOSIS — I493 Ventricular premature depolarization: Secondary | ICD-10-CM | POA: Insufficient documentation

## 2017-06-09 DIAGNOSIS — Z79891 Long term (current) use of opiate analgesic: Secondary | ICD-10-CM | POA: Diagnosis not present

## 2017-06-09 DIAGNOSIS — M533 Sacrococcygeal disorders, not elsewhere classified: Secondary | ICD-10-CM | POA: Insufficient documentation

## 2017-06-09 DIAGNOSIS — M810 Age-related osteoporosis without current pathological fracture: Secondary | ICD-10-CM | POA: Insufficient documentation

## 2017-06-09 DIAGNOSIS — J449 Chronic obstructive pulmonary disease, unspecified: Secondary | ICD-10-CM | POA: Insufficient documentation

## 2017-06-09 DIAGNOSIS — M47816 Spondylosis without myelopathy or radiculopathy, lumbar region: Secondary | ICD-10-CM | POA: Diagnosis not present

## 2017-06-09 DIAGNOSIS — E785 Hyperlipidemia, unspecified: Secondary | ICD-10-CM | POA: Insufficient documentation

## 2017-06-09 DIAGNOSIS — F5104 Psychophysiologic insomnia: Secondary | ICD-10-CM | POA: Insufficient documentation

## 2017-06-09 DIAGNOSIS — K219 Gastro-esophageal reflux disease without esophagitis: Secondary | ICD-10-CM | POA: Diagnosis not present

## 2017-06-09 DIAGNOSIS — G8929 Other chronic pain: Secondary | ICD-10-CM | POA: Insufficient documentation

## 2017-06-09 DIAGNOSIS — M545 Low back pain: Secondary | ICD-10-CM | POA: Insufficient documentation

## 2017-06-09 DIAGNOSIS — E46 Unspecified protein-calorie malnutrition: Secondary | ICD-10-CM | POA: Diagnosis not present

## 2017-06-09 DIAGNOSIS — E559 Vitamin D deficiency, unspecified: Secondary | ICD-10-CM | POA: Diagnosis not present

## 2017-06-09 DIAGNOSIS — I10 Essential (primary) hypertension: Secondary | ICD-10-CM | POA: Diagnosis not present

## 2017-06-09 NOTE — Patient Instructions (Signed)

## 2017-06-23 ENCOUNTER — Emergency Department: Payer: Medicare Other

## 2017-06-23 ENCOUNTER — Telehealth: Payer: Self-pay | Admitting: Acute Care

## 2017-06-23 ENCOUNTER — Emergency Department
Admission: EM | Admit: 2017-06-23 | Discharge: 2017-06-23 | Disposition: A | Discharge status: Home / Self Care | Payer: Medicare Other | Attending: Emergency Medicine | Admitting: Emergency Medicine

## 2017-06-23 ENCOUNTER — Encounter: Payer: Self-pay | Admitting: Intensive Care

## 2017-06-23 DIAGNOSIS — I10 Essential (primary) hypertension: Secondary | ICD-10-CM | POA: Insufficient documentation

## 2017-06-23 DIAGNOSIS — M545 Low back pain: Secondary | ICD-10-CM | POA: Insufficient documentation

## 2017-06-23 DIAGNOSIS — R05 Cough: Secondary | ICD-10-CM | POA: Diagnosis present

## 2017-06-23 DIAGNOSIS — G8929 Other chronic pain: Secondary | ICD-10-CM

## 2017-06-23 DIAGNOSIS — J441 Chronic obstructive pulmonary disease with (acute) exacerbation: Secondary | ICD-10-CM | POA: Diagnosis not present

## 2017-06-23 DIAGNOSIS — F17210 Nicotine dependence, cigarettes, uncomplicated: Secondary | ICD-10-CM | POA: Insufficient documentation

## 2017-06-23 DIAGNOSIS — M549 Dorsalgia, unspecified: Secondary | ICD-10-CM

## 2017-06-23 DIAGNOSIS — Z79899 Other long term (current) drug therapy: Secondary | ICD-10-CM | POA: Insufficient documentation

## 2017-06-23 LAB — BASIC METABOLIC PANEL
Anion gap: 8 (ref 5–15)
BUN: 20 mg/dL (ref 6–20)
CALCIUM: 8.9 mg/dL (ref 8.9–10.3)
CHLORIDE: 106 mmol/L (ref 101–111)
CO2: 24 mmol/L (ref 22–32)
CREATININE: 1.18 mg/dL — AB (ref 0.44–1.00)
GFR calc Af Amer: 58 mL/min — ABNORMAL LOW (ref >60)
GFR calc non Af Amer: 50 mL/min — ABNORMAL LOW (ref >60)
Glucose, Bld: 95 mg/dL (ref 65–99)
Potassium: 3.3 mmol/L — ABNORMAL LOW (ref 3.5–5.1)
SODIUM: 138 mmol/L (ref 135–145)

## 2017-06-23 LAB — INFLUENZA PANEL BY PCR (TYPE A & B)
INFLBPCR: NEGATIVE
Influenza A By PCR: NEGATIVE

## 2017-06-23 LAB — CBC
HCT: 36.7 % (ref 35.0–47.0)
Hemoglobin: 12.4 g/dL (ref 12.0–16.0)
MCH: 30.4 pg (ref 26.0–34.0)
MCHC: 33.7 g/dL (ref 32.0–36.0)
MCV: 90.3 fL (ref 80.0–100.0)
PLATELETS: 212 10*3/uL (ref 150–440)
RBC: 4.07 MIL/uL (ref 3.80–5.20)
RDW: 14.8 % — AB (ref 11.5–14.5)
WBC: 7.5 10*3/uL (ref 3.6–11.0)

## 2017-06-23 LAB — TROPONIN I

## 2017-06-23 MED ORDER — PREDNISONE 20 MG PO TABS: 20.0000 mg | ORAL_TABLET | Freq: Every day | ORAL | 0 refills | Status: DC

## 2017-06-23 MED ORDER — PREDNISONE 20 MG PO TABS
60.0000 mg | ORAL_TABLET | Freq: Once | ORAL | Status: AC
  Administered 2017-06-23: 60 mg via ORAL
  Filled 2017-06-23: qty 3

## 2017-06-23 MED ORDER — AZITHROMYCIN 250 MG PO TABS: ORAL_TABLET | ORAL | 0 refills | Status: AC

## 2017-06-23 MED ORDER — AZITHROMYCIN 500 MG PO TABS
500.0000 mg | ORAL_TABLET | Freq: Once | ORAL | Status: AC
  Administered 2017-06-23: 500 mg via ORAL
  Filled 2017-06-23: qty 1

## 2017-06-23 MED ORDER — IPRATROPIUM BROMIDE 0.02 % IN SOLN
0.5000 mg | Freq: Once | RESPIRATORY_TRACT | Status: AC
  Administered 2017-06-23: 0.5 mg via RESPIRATORY_TRACT
  Filled 2017-06-23: qty 2.5

## 2017-06-23 MED ORDER — LEVALBUTEROL HCL 1.25 MG/0.5ML IN NEBU
1.2500 mg | INHALATION_SOLUTION | Freq: Once | RESPIRATORY_TRACT | Status: AC
  Administered 2017-06-23: 1.25 mg via RESPIRATORY_TRACT
  Filled 2017-06-23: qty 0.5

## 2017-06-23 MED ORDER — ACETAMINOPHEN-CODEINE #3 300-30 MG PO TABS: 2.0000 | ORAL_TABLET | ORAL | 0 refills | Status: DC | PRN

## 2017-06-23 MED ORDER — LEVALBUTEROL TARTRATE 45 MCG/ACT IN AERO: 1.0000 | INHALATION_SPRAY | RESPIRATORY_TRACT | 2 refills | Status: DC | PRN

## 2017-06-23 NOTE — Discharge Instructions (Signed)
try the Xopenex inhaler 1-2 puffs 4 times a day as needed. If needed you can actually use it up to every 4 hours. Use your other inhaler as directed. Take the Zithromax antibiotic for the cough and the Tylenol with Codeine. I will also give you some prednisone for 3 days use that too.  Please return or see her doctor if you're worse or no better in 2-3 days.

## 2017-06-23 NOTE — ED Notes (Signed)
Report to [place], [place]
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
## 2017-06-23 NOTE — Telephone Encounter (Signed)
Called patient, unable to reach left message to give us a call back. 

## 2017-06-23 NOTE — ED Notes (Signed)
EMS from home , [facility], increased x2 days , wheezing in all fields , duoneb administered enroute, , 20 gauge left [facility]

## 2017-06-23 NOTE — Telephone Encounter (Signed)
Pt called EMS after calling this office and was taken to [facility]. Per Pt, ED physician ordered her a breathing treatment. Pt would like to this office to call [facility] ED to request they give her a CXR. Cb is [phone].

## 2017-06-23 NOTE — ED Triage Notes (Signed)
Patient c/o sob that started yesterday and has progressively gotten worse today. HX COPD. Denies wearing O2 at home. Talking in complete sentences with no respiratory distress noted. C/o pain in lungs when taking a deep breath

## 2017-07-03 ENCOUNTER — Telehealth: Payer: Self-pay | Admitting: Emergency Medicine

## 2017-07-03 NOTE — Telephone Encounter (Signed)
Called patient back as she had left message asking about her flu test on 2/18.  She says the result is not on her mychart.   I gave her result and explained that she could always call the pcp office for result if she cannot see it on her mychart.

## 2017-07-12 ENCOUNTER — Other Ambulatory Visit: Payer: Self-pay | Admitting: Acute Care

## 2017-07-18 ENCOUNTER — Telehealth: Payer: Self-pay | Admitting: Acute Care

## 2017-07-18 NOTE — Telephone Encounter (Signed)
She is on multiple medications with codeine that should be helping with her cough. She also has tessalon Perles she can use. She will have to be seen to have a new prescription. Thanks so much

## 2017-07-18 NOTE — Telephone Encounter (Signed)
Patient is aware nothing further needed. 

## 2017-07-18 NOTE — Telephone Encounter (Signed)
Called and spoke to patient, she states that she has the flu, but has not been diagnosed by a physician she is wanting a refill of the cheritussion. SG please advise on this, thanks.

## 2017-07-21 ENCOUNTER — Telehealth: Payer: Self-pay | Admitting: Nurse Practitioner

## 2017-07-21 NOTE — Telephone Encounter (Signed)
Error

## 2017-08-04 DIAGNOSIS — R5383 Other fatigue: Secondary | ICD-10-CM | POA: Diagnosis not present

## 2017-08-04 DIAGNOSIS — K469 Unspecified abdominal hernia without obstruction or gangrene: Secondary | ICD-10-CM | POA: Diagnosis not present

## 2017-08-04 DIAGNOSIS — E041 Nontoxic single thyroid nodule: Secondary | ICD-10-CM | POA: Diagnosis not present

## 2017-08-04 DIAGNOSIS — R1013 Epigastric pain: Secondary | ICD-10-CM | POA: Diagnosis not present

## 2017-08-11 ENCOUNTER — Ambulatory Visit: Payer: Medicare Other | Attending: Nurse Practitioner | Admitting: Nurse Practitioner

## 2017-08-11 ENCOUNTER — Encounter: Payer: Self-pay | Admitting: Nurse Practitioner

## 2017-08-11 ENCOUNTER — Other Ambulatory Visit: Payer: Self-pay

## 2017-08-11 VITALS — BP 131/71 | HR 72 | Resp 16 | Ht 64.0 in | Wt 128.0 lb

## 2017-08-11 DIAGNOSIS — Z79899 Other long term (current) drug therapy: Secondary | ICD-10-CM | POA: Insufficient documentation

## 2017-08-11 DIAGNOSIS — K219 Gastro-esophageal reflux disease without esophagitis: Secondary | ICD-10-CM | POA: Insufficient documentation

## 2017-08-11 DIAGNOSIS — M961 Postlaminectomy syndrome, not elsewhere classified: Secondary | ICD-10-CM | POA: Diagnosis not present

## 2017-08-11 DIAGNOSIS — Z888 Allergy status to other drugs, medicaments and biological substances status: Secondary | ICD-10-CM | POA: Diagnosis not present

## 2017-08-11 DIAGNOSIS — E785 Hyperlipidemia, unspecified: Secondary | ICD-10-CM | POA: Diagnosis not present

## 2017-08-11 DIAGNOSIS — M792 Neuralgia and neuritis, unspecified: Secondary | ICD-10-CM

## 2017-08-11 DIAGNOSIS — G4701 Insomnia due to medical condition: Secondary | ICD-10-CM

## 2017-08-11 DIAGNOSIS — R252 Cramp and spasm: Secondary | ICD-10-CM | POA: Diagnosis not present

## 2017-08-11 DIAGNOSIS — Z886 Allergy status to analgesic agent status: Secondary | ICD-10-CM | POA: Diagnosis not present

## 2017-08-11 DIAGNOSIS — I493 Ventricular premature depolarization: Secondary | ICD-10-CM | POA: Diagnosis not present

## 2017-08-11 DIAGNOSIS — Z79891 Long term (current) use of opiate analgesic: Secondary | ICD-10-CM | POA: Diagnosis not present

## 2017-08-11 DIAGNOSIS — I951 Orthostatic hypotension: Secondary | ICD-10-CM | POA: Diagnosis not present

## 2017-08-11 DIAGNOSIS — Z8371 Family history of colonic polyps: Secondary | ICD-10-CM | POA: Insufficient documentation

## 2017-08-11 DIAGNOSIS — Z9889 Other specified postprocedural states: Secondary | ICD-10-CM | POA: Insufficient documentation

## 2017-08-11 DIAGNOSIS — K921 Melena: Secondary | ICD-10-CM | POA: Insufficient documentation

## 2017-08-11 DIAGNOSIS — M25552 Pain in left hip: Secondary | ICD-10-CM | POA: Diagnosis not present

## 2017-08-11 DIAGNOSIS — R079 Chest pain, unspecified: Secondary | ICD-10-CM | POA: Insufficient documentation

## 2017-08-11 DIAGNOSIS — M47816 Spondylosis without myelopathy or radiculopathy, lumbar region: Secondary | ICD-10-CM | POA: Diagnosis not present

## 2017-08-11 DIAGNOSIS — I10 Essential (primary) hypertension: Secondary | ICD-10-CM | POA: Insufficient documentation

## 2017-08-11 DIAGNOSIS — Z809 Family history of malignant neoplasm, unspecified: Secondary | ICD-10-CM | POA: Insufficient documentation

## 2017-08-11 DIAGNOSIS — M25551 Pain in right hip: Secondary | ICD-10-CM | POA: Diagnosis not present

## 2017-08-11 DIAGNOSIS — G894 Chronic pain syndrome: Secondary | ICD-10-CM

## 2017-08-11 DIAGNOSIS — F17210 Nicotine dependence, cigarettes, uncomplicated: Secondary | ICD-10-CM | POA: Insufficient documentation

## 2017-08-11 DIAGNOSIS — M533 Sacrococcygeal disorders, not elsewhere classified: Secondary | ICD-10-CM | POA: Insufficient documentation

## 2017-08-11 DIAGNOSIS — M7918 Myalgia, other site: Secondary | ICD-10-CM | POA: Diagnosis not present

## 2017-08-11 DIAGNOSIS — J449 Chronic obstructive pulmonary disease, unspecified: Secondary | ICD-10-CM | POA: Diagnosis not present

## 2017-08-11 DIAGNOSIS — G8929 Other chronic pain: Secondary | ICD-10-CM

## 2017-08-11 DIAGNOSIS — M5126 Other intervertebral disc displacement, lumbar region: Secondary | ICD-10-CM | POA: Insufficient documentation

## 2017-08-11 DIAGNOSIS — Z90710 Acquired absence of both cervix and uterus: Secondary | ICD-10-CM | POA: Insufficient documentation

## 2017-08-11 DIAGNOSIS — E559 Vitamin D deficiency, unspecified: Secondary | ICD-10-CM | POA: Insufficient documentation

## 2017-08-11 DIAGNOSIS — Z811 Family history of alcohol abuse and dependence: Secondary | ICD-10-CM | POA: Insufficient documentation

## 2017-08-11 DIAGNOSIS — Z881 Allergy status to other antibiotic agents status: Secondary | ICD-10-CM | POA: Insufficient documentation

## 2017-08-11 DIAGNOSIS — G8918 Other acute postprocedural pain: Secondary | ICD-10-CM | POA: Insufficient documentation

## 2017-08-11 MED ORDER — TRAZODONE HCL 100 MG PO TABS: 100.0000 mg | ORAL_TABLET | Freq: Every day | ORAL | 0 refills | Status: DC

## 2017-08-11 MED ORDER — OXYCODONE HCL 5 MG PO TABS: 5.0000 mg | ORAL_TABLET | Freq: Three times a day (TID) | ORAL | 0 refills | Status: DC | PRN

## 2017-08-11 MED ORDER — CYCLOBENZAPRINE HCL 10 MG PO TABS: 10.0000 mg | ORAL_TABLET | Freq: Three times a day (TID) | ORAL | 2 refills | Status: DC | PRN

## 2017-08-11 NOTE — Patient Instructions (Addendum)
___you have been given a script for oxycodone x3_and you have been informed that you have scripts to be picked up at the pharmacy. ________________________________________________________________________________________  Medication Rules  Applies to: All patients receiving prescriptions (written or electronic).  Pharmacy of record: Pharmacy where electronic prescriptions will be sent. If written prescriptions are taken to a different pharmacy, please inform the nursing staff. The pharmacy listed in the electronic medical record should be the one where you would like electronic prescriptions to be sent.  Prescription refills: Only during scheduled appointments. Applies to both, written and electronic prescriptions.  NOTE: The following applies primarily to controlled substances (Opioid* Pain Medications).   Patient's responsibilities: 1. Pain Pills: Bring all pain pills to every appointment (except for procedure appointments). 2. Pill Bottles: Bring pills in original pharmacy bottle. Always bring newest bottle. Bring bottle, even if empty. 3. Medication refills: You are responsible for knowing and keeping track of what medications you need refilled. The day before your appointment, write a list of all prescriptions that need to be refilled. Bring that list to your appointment and give it to the admitting nurse. Prescriptions will be written only during appointments. If you forget a medication, it will not be "Called in", "Faxed", or "electronically sent". You will need to get another appointment to get these prescribed. 4. Prescription Accuracy: You are responsible for carefully inspecting your prescriptions before leaving our office. Have the discharge nurse carefully go over each prescription with you, before taking them home. Make sure that your name is accurately spelled, that your address is correct. Check the name and dose of your medication to make sure it is accurate. Check the number of  pills, and the written instructions to make sure they are clear and accurate. Make sure that you are given enough medication to last until your next medication refill appointment. 5. Taking Medication: Take medication as prescribed. Never take more pills than instructed. Never take medication more frequently than prescribed. Taking less pills or less frequently is permitted and encouraged, when it comes to controlled substances (written prescriptions).  6. Inform other Doctors: Always inform, all of your healthcare providers, of all the medications you take. 7. Pain Medication from other Providers: You are not allowed to accept any additional pain medication from any other Doctor or Healthcare provider. There are two exceptions to this rule. (see below) In the event that you require additional pain medication, you are responsible for notifying us, as stated below. 8. Medication Agreement: You are responsible for carefully reading and following our Medication Agreement. This must be signed before receiving any prescriptions from our practice. Safely store a copy of your signed Agreement. Violations to the Agreement will result in no further prescriptions. (Additional copies of our Medication Agreement are available upon request.) 9. Laws, Rules, & Regulations: All patients are expected to follow all Federal and [employer], [employer], Rules, [employer]. Ignorance of the Laws does not constitute a valid excuse. The use of any illegal substances is prohibited. 10. Adopted CDC guidelines & recommendations: Target dosing levels will be at or below 60 MME/day. Use of benzodiazepines** is not recommended.  Exceptions: There are only two exceptions to the rule of not receiving pain medications from other Healthcare Providers. 1. Exception #1 (Emergencies): In the event of an emergency (i.e.: accident requiring emergency care), you are allowed to receive additional pain medication. However, you are responsible for:  As soon as you are able, call our office [phone], at any time of the day  or night, and leave a message stating your name, the date and nature of the emergency, and the name and dose of the medication prescribed. In the event that your call is answered by a member of our staff, make sure to document and save the date, time, and the name of the person that took your information.  2. Exception #2 (Planned Surgery): In the event that you are scheduled by another doctor or dentist to have any type of surgery or procedure, you are allowed (for a period no longer than 30 days), to receive additional pain medication, for the acute post-op pain. However, in this case, you are responsible for picking up a copy of our "Post-op Pain Management for Surgeons" handout, and giving it to your surgeon or dentist. This document is available at our office, and does not require an appointment to obtain it. Simply go to our office during business hours (Monday-Thursday from 8:00 AM to 4:00 PM) (Friday 8:00 AM to 12:00 Noon) or if you have a scheduled appointment with [place], prior to your surgery, and ask for it by name. In addition, you will need to provide [place] with your name, name of your surgeon, type of surgery, and date of procedure or surgery.  *Opioid medications include: morphine, codeine, oxycodone, oxymorphone, hydrocodone, hydromorphone, meperidine, tramadol, tapentadol, buprenorphine, fentanyl, methadone. **Benzodiazepine medications include: diazepam (Valium), alprazolam (Xanax), clonazepam (Klonopine), lorazepam (Ativan), clorazepate (Tranxene), chlordiazepoxide (Librium), estazolam (Prosom), oxazepam (Serax), temazepam (Restoril), triazolam (Halcion) (Last updated: 07/03/2017) ____________________________________________________________________________________________

## 2017-08-11 NOTE — Progress Notes (Signed)
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  Applies to: All patients receiving prescriptions (written or electronic).  Pharmacy of record: Pharmacy where electronic prescriptions will be sent. If written prescriptions are taken to a different pharmacy, please inform the nursing staff. The pharmacy listed in the electronic medical record should be the one where you would like electronic prescriptions to be sent.  Prescription refills: Only during scheduled appointments. Applies to both, written and electronic prescriptions.  NOTE: The following applies primarily to controlled substances (Opioid* Pain Medications).   Patient's responsibilities: 1. Pain Pills: Bring all pain pills to every appointment (except for procedure appointments). 2. Pill Bottles: Bring pills in original pharmacy bottle. Always bring newest bottle. Bring bottle,  even if empty. 3. Medication refills: You are responsible for knowing and keeping track of what medications you need refilled. The day before your appointment, write a list of all prescriptions that need to be refilled. Bring that list to your appointment and give it to the admitting nurse. Prescriptions will be written only during appointments. If you forget a medication, it will not be "Called in", "Faxed", or "electronically sent". You will need to get another appointment to get these prescribed. 4. Prescription Accuracy: You are responsible for carefully inspecting your prescriptions before leaving our office. Have the discharge nurse carefully go over each prescription with you, before taking them home. Make sure that your name is accurately spelled, that your address is correct. Check the name and dose of your medication to make sure it is accurate. Check the number of pills, and the written instructions to make sure they are clear and accurate. Make sure that you are given enough medication to last until your next medication refill appointment. 5. Taking Medication: Take medication as prescribed. Never take more pills than instructed. Never take medication more frequently than prescribed. Taking less pills or less frequently is permitted and encouraged, when it comes to controlled substances (written prescriptions).  6. Inform other Doctors: Always inform, all of your healthcare providers, of all the medications you take. 7. Pain Medication from other Providers: You are not allowed to accept any additional pain medication from any other Doctor or Healthcare provider. There are two exceptions to this rule. (see below) In the event that you require additional pain medication,  you are responsible for notifying us, as stated below. 8. Medication Agreement: You are responsible for carefully reading and following our Medication Agreement. This must be signed before receiving any prescriptions from our  practice. Safely store a copy of your signed Agreement. Violations to the Agreement will result in no further prescriptions. (Additional copies of our Medication Agreement are available upon request.) 9. Laws, Rules, & Regulations: All patients are expected to follow all Federal and [employer], Rules, [employer]. Ignorance of the Laws does not constitute a valid excuse. The use of any illegal substances is prohibited. 10. Adopted CDC guidelines & recommendations: Target dosing levels will be at or below 60 MME/day. Use of benzodiazepines** is not recommended.  Exceptions: There are only two exceptions to the rule of not receiving pain medications from other Healthcare Providers. 1. Exception #1 (Emergencies): In the event of an emergency (i.e.: accident requiring emergency care), you are allowed to receive additional pain medication. However, you are responsible for: As soon as you are able, call our office [phone], at any time of the day or night, and leave a message stating your name, the date and nature of the emergency, and the name and dose of the medication prescribed. In the event that your call is answered by a member of our staff, make sure to document and save the date, time, and the name of the person that took your information.  2. Exception #2 (Planned Surgery): In the event that you are scheduled by another doctor or dentist to have any type of surgery or procedure, you are allowed (for a period no longer than 30 days), to receive additional pain medication, for the acute post-op pain. However, in this case, you are responsible for picking up a copy of our "Post-op Pain Management for Surgeons" handout, and giving it to your surgeon or dentist. This document is available at our office, and does not require an appointment to obtain it. Simply go to our office during business hours (Monday-Thursday from 8:00 AM to 4:00 PM) (Friday 8:00 AM to 12:00 Noon) or if you have a  scheduled appointment with [place], prior to your surgery, and ask for it by name. In addition, you will need to provide [place] with your name, name of your surgeon, type of surgery, and date of procedure or surgery.  *Opioid medications include: morphine, codeine, oxycodone, oxymorphone, hydrocodone, hydromorphone, meperidine, tramadol, tapentadol, buprenorphine, fentanyl, methadone. **Benzodiazepine medications include: diazepam (Valium), alprazolam (Xanax), clonazepam (Klonopine), lorazepam (Ativan), clorazepate (Tranxene), chlordiazepoxide (Librium), estazolam (Prosom), oxazepam (Serax), temazepam (Restoril), triazolam (Halcion) (Last updated: 07/03/2017) ____________________________________________________________________________________________

## 2017-08-11 NOTE — Progress Notes (Signed)
Nursing Pain Medication Assessment:  Safety precautions to be maintained throughout the outpatient stay will include: orient to surroundings, keep bed in low position, maintain call bell within reach at all times, provide assistance with transfer out of bed and ambulation.  Medication Inspection Compliance: Pill count conducted under aseptic conditions, in front of the patient. Neither the pills nor the bottle was removed from the patient's sight at any time. Once count was completed pills were immediately returned to the patient in their original bottle.  Medication: Oxycodone IR Pill/Patch Count: 25 of 90 pills remain Pill/Patch Appearance: Markings consistent with prescribed medication Bottle Appearance: Standard pharmacy container. Clearly labeled. Filled Date: 03 / 18/ 2019 Last Medication intake:  Today

## 2017-09-09 DIAGNOSIS — E7800 Pure hypercholesterolemia, unspecified: Secondary | ICD-10-CM | POA: Diagnosis not present

## 2017-09-09 DIAGNOSIS — N390 Urinary tract infection, site not specified: Secondary | ICD-10-CM | POA: Diagnosis not present

## 2017-09-09 DIAGNOSIS — G4700 Insomnia, unspecified: Secondary | ICD-10-CM | POA: Diagnosis not present

## 2017-09-09 DIAGNOSIS — Z79899 Other long term (current) drug therapy: Secondary | ICD-10-CM | POA: Diagnosis not present

## 2017-09-09 DIAGNOSIS — Z0000 Encounter for general adult medical examination without abnormal findings: Secondary | ICD-10-CM | POA: Diagnosis not present

## 2017-09-09 DIAGNOSIS — R7989 Other specified abnormal findings of blood chemistry: Secondary | ICD-10-CM | POA: Diagnosis not present

## 2017-09-09 DIAGNOSIS — J449 Chronic obstructive pulmonary disease, unspecified: Secondary | ICD-10-CM | POA: Diagnosis not present

## 2017-09-09 DIAGNOSIS — E559 Vitamin D deficiency, unspecified: Secondary | ICD-10-CM | POA: Diagnosis not present

## 2017-09-09 DIAGNOSIS — R5383 Other fatigue: Secondary | ICD-10-CM | POA: Diagnosis not present

## 2017-09-09 DIAGNOSIS — Z118 Encounter for screening for other infectious and parasitic diseases: Secondary | ICD-10-CM | POA: Diagnosis not present

## 2017-09-09 DIAGNOSIS — R300 Dysuria: Secondary | ICD-10-CM | POA: Diagnosis not present

## 2017-09-09 DIAGNOSIS — Z112 Encounter for screening for other bacterial diseases: Secondary | ICD-10-CM | POA: Diagnosis not present

## 2017-09-15 ENCOUNTER — Encounter: Payer: Self-pay | Admitting: General Surgery

## 2017-10-14 ENCOUNTER — Ambulatory Visit
Admission: RE | Admit: 2017-10-14 | Discharge: 2017-10-14 | Disposition: A | Discharge status: Home / Self Care | Payer: Medicare Other | Source: Ambulatory Visit | Attending: Family Medicine | Admitting: Family Medicine

## 2017-10-14 ENCOUNTER — Other Ambulatory Visit: Payer: Self-pay | Admitting: Family Medicine

## 2017-10-14 DIAGNOSIS — M25561 Pain in right knee: Secondary | ICD-10-CM

## 2017-10-14 DIAGNOSIS — M2550 Pain in unspecified joint: Secondary | ICD-10-CM | POA: Diagnosis not present

## 2017-10-14 DIAGNOSIS — M25562 Pain in left knee: Secondary | ICD-10-CM

## 2017-10-14 DIAGNOSIS — M25521 Pain in right elbow: Secondary | ICD-10-CM

## 2017-10-14 DIAGNOSIS — M1711 Unilateral primary osteoarthritis, right knee: Secondary | ICD-10-CM | POA: Diagnosis not present

## 2017-10-14 DIAGNOSIS — M25522 Pain in left elbow: Secondary | ICD-10-CM

## 2017-10-14 DIAGNOSIS — M1712 Unilateral primary osteoarthritis, left knee: Secondary | ICD-10-CM | POA: Diagnosis not present

## 2017-10-14 DIAGNOSIS — M779 Enthesopathy, unspecified: Secondary | ICD-10-CM | POA: Diagnosis not present

## 2017-10-14 DIAGNOSIS — R221 Localized swelling, mass and lump, neck: Secondary | ICD-10-CM | POA: Diagnosis not present

## 2017-10-15 ENCOUNTER — Encounter: Payer: Self-pay | Admitting: General Surgery

## 2017-10-15 DIAGNOSIS — G603 Idiopathic progressive neuropathy: Secondary | ICD-10-CM | POA: Diagnosis not present

## 2017-10-15 DIAGNOSIS — M7711 Lateral epicondylitis, right elbow: Secondary | ICD-10-CM | POA: Diagnosis not present

## 2017-10-15 DIAGNOSIS — M79661 Pain in right lower leg: Secondary | ICD-10-CM | POA: Diagnosis not present

## 2017-10-17 DIAGNOSIS — M25561 Pain in right knee: Secondary | ICD-10-CM | POA: Diagnosis not present

## 2017-10-17 DIAGNOSIS — M25562 Pain in left knee: Secondary | ICD-10-CM | POA: Diagnosis not present

## 2017-10-17 DIAGNOSIS — M25521 Pain in right elbow: Secondary | ICD-10-CM | POA: Diagnosis not present

## 2017-10-21 ENCOUNTER — Ambulatory Visit (INDEPENDENT_AMBULATORY_CARE_PROVIDER_SITE_OTHER): Payer: Medicare Other | Admitting: General Surgery

## 2017-10-21 ENCOUNTER — Encounter: Payer: Self-pay | Admitting: General Surgery

## 2017-10-21 VITALS — BP 120/70 | HR 89 | Resp 16 | Ht 64.0 in | Wt 123.0 lb

## 2017-10-21 DIAGNOSIS — K439 Ventral hernia without obstruction or gangrene: Secondary | ICD-10-CM | POA: Diagnosis not present

## 2017-10-21 NOTE — Patient Instructions (Addendum)
Try to quit smoking prior to surgery Follow up in September   Hernia, Adult A hernia is the bulging of an organ or tissue through a weak spot in the muscles of the abdomen (abdominal wall). Hernias develop most often near the navel or groin. There are many kinds of hernias. Common kinds include:  Femoral hernia. This kind of hernia develops under the groin in the upper thigh area.  Inguinal hernia. This kind of hernia develops in the groin or scrotum.  Umbilical hernia. This kind of hernia develops near the navel.  Hiatal hernia. This kind of hernia causes part of the stomach to be pushed up into the chest.  Incisional hernia. This kind of hernia bulges through a scar from an abdominal surgery.  What are the causes? This condition may be caused by:  Heavy lifting.  Coughing over a long period of time.  Straining to have a bowel movement.  An incision made during an abdominal surgery.  A birth defect (congenital defect).  Excess weight or obesity.  Smoking.  Poor nutrition.  Cystic fibrosis.  Excess fluid in the abdomen.  Undescended testicles.  What are the signs or symptoms? Symptoms of a hernia include:  A lump on the abdomen. This is the first sign of a hernia. The lump may become more obvious with standing, straining, or coughing. It may get bigger over time if it is not treated or if the condition causing it is not treated.  Pain. A hernia is usually painless, but it may become painful over time if treatment is delayed. The pain is usually dull and may get worse with standing or lifting heavy objects.  Sometimes a hernia gets tightly squeezed in the weak spot (strangulated) or stuck there (incarcerated) and causes additional symptoms. These symptoms may include:  Vomiting.  Nausea.  Constipation.  Irritability.  How is this diagnosed? A hernia may be diagnosed with:  A physical exam. During the exam your health care provider may ask you to cough or  to make a specific movement, because a hernia is usually more visible when you move.  Imaging tests. These can include: ? X-rays. ? Ultrasound. ? CT scan.  How is this treated? A hernia that is small and painless may not need to be treated. A hernia that is large or painful may be treated with surgery. Inguinal hernias may be treated with surgery to prevent incarceration or strangulation. Strangulated hernias are always treated with surgery, because lack of blood to the trapped organ or tissue can cause it to die. Surgery to treat a hernia involves pushing the bulge back into place and repairing the weak part of the abdomen. Follow these instructions at home:  Avoid straining.  Do not lift anything heavier than 10 lb (4.5 kg).  Lift with your leg muscles, not your back muscles. This helps avoid strain.  When coughing, try to cough gently.  Prevent constipation. Constipation leads to straining with bowel movements, which can make a hernia worse or cause a hernia repair to break down. You can prevent constipation by: ? Eating a high-fiber diet that includes plenty of fruits and vegetables. ? Drinking enough fluids to keep your urine clear or pale yellow. Aim to drink 6-8 glasses of water per day. ? Using a stool softener as directed by your health care provider.  Lose weight, if you are overweight.  Do not use any tobacco products, including cigarettes, chewing tobacco, or electronic cigarettes. If you need help quitting, ask your health  care provider.  Keep all follow-up visits as directed by your health care provider. This is important. Your health care provider may need to monitor your condition. Contact a health care provider if:  You have swelling, redness, and pain in the affected area.  Your bowel habits change. Get help right away if:  You have a fever.  You have abdominal pain that is getting worse.  You feel nauseous or you vomit.  You cannot push the hernia back in  place by gently pressing on it while you are lying down.  The hernia: ? Changes in shape or size. ? Is stuck outside the abdomen. ? Becomes discolored. ? Feels hard or tender. This information is not intended to replace advice given to you by your health care provider. Make sure you discuss any questions you have with your health care provider. Document Released: 04/22/2005 Document Revised: 09/20/2015 Document Reviewed: 03/02/2014 Elsevier Interactive Patient Education  2017 [employer].
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
## 2017-10-28 ENCOUNTER — Telehealth: Payer: Self-pay

## 2017-10-28 NOTE — Telephone Encounter (Signed)
Spoke with Paula Duncan and she is needing refill on her Gabapentin.  States she has an appt with Crystal on 11/10/17,  Gabapentin per her records has not been prescribed since 05/20/17 by Dionisio David and the way she is taking is different than that sig.  I told patient that she would need to come in and see Dr Dossie Arbour if she needs her medications before appt time with CRystal. She will call if she can make appt time.

## 2017-10-28 NOTE — Telephone Encounter (Signed)
Patient is out of Neurontin and wants to know if it can be called out. She has an appt with Crystal on 7/8.

## 2017-11-10 ENCOUNTER — Encounter: Payer: Self-pay | Admitting: Nurse Practitioner

## 2017-11-10 ENCOUNTER — Other Ambulatory Visit: Payer: Self-pay

## 2017-11-10 ENCOUNTER — Ambulatory Visit: Payer: Medicare Other | Attending: Nurse Practitioner | Admitting: Nurse Practitioner

## 2017-11-10 VITALS — BP 150/85 | HR 108 | Temp 98.0°F | Resp 18 | Ht 64.0 in | Wt 125.0 lb

## 2017-11-10 DIAGNOSIS — M545 Low back pain: Secondary | ICD-10-CM | POA: Diagnosis not present

## 2017-11-10 DIAGNOSIS — K219 Gastro-esophageal reflux disease without esophagitis: Secondary | ICD-10-CM | POA: Diagnosis not present

## 2017-11-10 DIAGNOSIS — Z79891 Long term (current) use of opiate analgesic: Secondary | ICD-10-CM | POA: Insufficient documentation

## 2017-11-10 DIAGNOSIS — M7918 Myalgia, other site: Secondary | ICD-10-CM | POA: Diagnosis not present

## 2017-11-10 DIAGNOSIS — M533 Sacrococcygeal disorders, not elsewhere classified: Secondary | ICD-10-CM

## 2017-11-10 DIAGNOSIS — Z90710 Acquired absence of both cervix and uterus: Secondary | ICD-10-CM | POA: Diagnosis not present

## 2017-11-10 DIAGNOSIS — M47816 Spondylosis without myelopathy or radiculopathy, lumbar region: Secondary | ICD-10-CM | POA: Insufficient documentation

## 2017-11-10 DIAGNOSIS — Z9889 Other specified postprocedural states: Secondary | ICD-10-CM | POA: Insufficient documentation

## 2017-11-10 DIAGNOSIS — J449 Chronic obstructive pulmonary disease, unspecified: Secondary | ICD-10-CM | POA: Diagnosis not present

## 2017-11-10 DIAGNOSIS — M961 Postlaminectomy syndrome, not elsewhere classified: Secondary | ICD-10-CM | POA: Insufficient documentation

## 2017-11-10 DIAGNOSIS — F17210 Nicotine dependence, cigarettes, uncomplicated: Secondary | ICD-10-CM | POA: Insufficient documentation

## 2017-11-10 DIAGNOSIS — E785 Hyperlipidemia, unspecified: Secondary | ICD-10-CM | POA: Diagnosis not present

## 2017-11-10 DIAGNOSIS — R252 Cramp and spasm: Secondary | ICD-10-CM

## 2017-11-10 DIAGNOSIS — G8929 Other chronic pain: Secondary | ICD-10-CM | POA: Diagnosis not present

## 2017-11-10 DIAGNOSIS — G4700 Insomnia, unspecified: Secondary | ICD-10-CM | POA: Diagnosis not present

## 2017-11-10 DIAGNOSIS — G894 Chronic pain syndrome: Secondary | ICD-10-CM | POA: Insufficient documentation

## 2017-11-10 DIAGNOSIS — M79605 Pain in left leg: Secondary | ICD-10-CM | POA: Diagnosis not present

## 2017-11-10 DIAGNOSIS — G4701 Insomnia due to medical condition: Secondary | ICD-10-CM

## 2017-11-10 DIAGNOSIS — Z5181 Encounter for therapeutic drug level monitoring: Secondary | ICD-10-CM | POA: Diagnosis not present

## 2017-11-10 DIAGNOSIS — M792 Neuralgia and neuritis, unspecified: Secondary | ICD-10-CM

## 2017-11-10 DIAGNOSIS — I10 Essential (primary) hypertension: Secondary | ICD-10-CM | POA: Diagnosis not present

## 2017-11-10 DIAGNOSIS — Z79899 Other long term (current) drug therapy: Secondary | ICD-10-CM | POA: Insufficient documentation

## 2017-11-10 MED ORDER — OXYCODONE HCL 5 MG PO TABS: 5.0000 mg | ORAL_TABLET | Freq: Three times a day (TID) | ORAL | 0 refills | Status: DC | PRN

## 2017-11-10 MED ORDER — GABAPENTIN 300 MG PO CAPS: 100.0000 mg | ORAL_CAPSULE | Freq: Three times a day (TID) | ORAL | 2 refills | Status: DC

## 2017-11-10 MED ORDER — TRAZODONE HCL 100 MG PO TABS
100.0000 mg | ORAL_TABLET | Freq: Every day | ORAL | 0 refills | Status: DC
Start: 2017-11-10 — End: 2018-02-10

## 2017-11-10 MED ORDER — CYCLOBENZAPRINE HCL 10 MG PO TABS: 10.0000 mg | ORAL_TABLET | Freq: Three times a day (TID) | ORAL | 2 refills | Status: DC | PRN

## 2017-11-10 NOTE — Progress Notes (Signed)
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 The following applies primarily to controlled substances (Opioid* Pain Medications).   Patient's responsibilities: 1. Pain Pills: Bring all pain pills to every appointment (except for procedure appointments). 2. Pill Bottles: Bring pills in original pharmacy bottle. Always bring newest bottle. Bring bottle, even if empty. 3. Medication refills: You are responsible for knowing and keeping track of what medications you need refilled. The day before your appointment, write a list of all prescriptions that need to be refilled. Bring that list to your appointment and give it to the admitting nurse. Prescriptions will be written only during appointments. If you forget a medication, it will not be "Called in", "Faxed", or "electronically sent". You will need to get another appointment to get these prescribed. 4. Prescription Accuracy: You are responsible for  carefully inspecting your prescriptions before leaving our office. Have the discharge nurse carefully go over each prescription with you, before taking them home. Make sure that your name is accurately spelled, that your address is correct. Check the name and dose of your medication to make sure it is accurate. Check the number of pills, and the written instructions to make sure they are clear and accurate. Make sure that you are given enough medication to last until your next medication refill appointment. 5. Taking Medication: Take medication as prescribed. Never take more pills than instructed. Never take medication more frequently than prescribed. Taking less pills or less frequently is permitted and encouraged, when it comes to controlled substances (written prescriptions).  6. Inform other Doctors: Always inform, all of your healthcare providers, of all the medications you take. 7. Pain Medication from other Providers: You are not allowed to accept any additional pain medication from any other Doctor or Healthcare provider. There are two exceptions to this rule. (see below) In the event that you require additional pain medication, you are responsible for notifying us, as stated below. 8. Medication Agreement: You are responsible for carefully reading and following our Medication Agreement. This must be signed before receiving any prescriptions from our practice. Safely store a copy of your signed Agreement. Violations to the Agreement will result in no further prescriptions. (Additional copies of our Medication Agreement are available upon request.)  9. Laws, Rules, & Regulations: All patients are expected to follow all Federal and [employer], [employer], Rules, [employer]. Ignorance of the Laws does not constitute a valid excuse. The use of any illegal substances is prohibited. 10. Adopted CDC guidelines & recommendations: Target dosing levels will be at or below 60 MME/day. Use of benzodiazepines** is  not recommended.  Exceptions: There are only two exceptions to the rule of not receiving pain medications from other Healthcare Providers. 1. Exception #1 (Emergencies): In the event of an emergency (i.e.: accident requiring emergency care), you are allowed to receive additional pain medication. However, you are responsible for: As soon as you are able, call our office [phone], at any time of the day or night, and leave a message stating your name, the date and nature of the emergency, and the name and dose of the medication prescribed. In the event that your call is answered by a member of our staff, make sure to document and save the date, time, and the name of the person that took your information.  2. Exception #2 (Planned Surgery): In the event that you are scheduled by another doctor or dentist to have any type of surgery or procedure, you are allowed (for a period no longer than 30 days), to receive additional pain medication, for the acute post-op pain. However, in this case, you are responsible for picking up a copy of our "Post-op Pain Management for Surgeons" handout, and giving it to your surgeon or dentist. This document is available at our office, and does not require an appointment to obtain it. Simply go to our office during business hours (Monday-Thursday from 8:00 AM to 4:00 PM) (Friday 8:00 AM to 12:00 Noon) or if you have a scheduled appointment with [place], prior to your surgery, and ask for it by name. In addition, you will need to provide [place] with your name, name of your surgeon, type of surgery, and date of procedure or surgery.  *Opioid medications include: morphine, codeine, oxycodone, oxymorphone, hydrocodone, hydromorphone, meperidine, tramadol, tapentadol, buprenorphine, fentanyl, methadone. **Benzodiazepine medications include: diazepam (Valium), alprazolam (Xanax), clonazepam (Klonopine), lorazepam (Ativan), clorazepate (Tranxene), chlordiazepoxide (Librium), estazolam  (Prosom), oxazepam (Serax), temazepam (Restoril), triazolam (Halcion) (Last updated: 07/03/2017) ____________________________________________________________________________________________   ____________________________________________________________________________________________  Medication Rules  Applies to: All patients receiving prescriptions (written or electronic).  Pharmacy of record: Pharmacy where electronic prescriptions will be sent. If written prescriptions are taken to a different pharmacy, please inform the nursing staff. The pharmacy listed in the electronic medical record should be the one where you would like electronic prescriptions to be sent.  Prescription refills: Only during scheduled appointments. Applies to both, written and electronic prescriptions.  NOTE: The following applies primarily to controlled substances (Opioid* Pain Medications).   Patient's responsibilities: 11. Pain Pills: Bring all pain pills to every appointment (except for procedure appointments). 12. Pill Bottles: Bring pills in original pharmacy bottle. Always bring newest bottle. Bring bottle, even if empty. 13. Medication refills: You are responsible for knowing and keeping track of what medications you need refilled. The day before your appointment, write a list of all prescriptions that need to be refilled. Bring that list to your appointment and give it to the admitting nurse. Prescriptions will be written only during appointments. If you forget a medication, it will not be "Called in", "Faxed", or "electronically sent". You will need to get another appointment to get these prescribed. 14. Prescription Accuracy: You are responsible for carefully inspecting your prescriptions before leaving our office.  Have the discharge nurse carefully go over each prescription with you, before taking them home. Make sure that your name is accurately spelled, that your address is correct. Check the name and dose  of your medication to make sure it is accurate. Check the number of pills, and the written instructions to make sure they are clear and accurate. Make sure that you are given enough medication to last until your next medication refill appointment. 15. Taking Medication: Take medication as prescribed. Never take more pills than instructed. Never take medication more frequently than prescribed. Taking less pills or less frequently is permitted and encouraged, when it comes to controlled substances (written prescriptions).  16. Inform other Doctors: Always inform, all of your healthcare providers, of all the medications you take. 17. Pain Medication from other Providers: You are not allowed to accept any additional pain medication from any other Doctor or Healthcare provider. There are two exceptions to this rule. (see below) In the event that you require additional pain medication, you are responsible for notifying us, as stated below. 18. Medication Agreement: You are responsible for carefully reading and following our Medication Agreement. This must be signed before receiving any prescriptions from our practice. Safely store a copy of your signed Agreement. Violations to the Agreement will result in no further prescriptions. (Additional copies of our Medication Agreement are available upon request.) 19. Laws, Rules, & Regulations: All patients are expected to follow all Federal and [employer], [employer], Rules, [employer]. Ignorance of the Laws does not constitute a valid excuse. The use of any illegal substances is prohibited. 20. Adopted CDC guidelines & recommendations: Target dosing levels will be at or below 60 MME/day. Use of benzodiazepines** is not recommended.  Exceptions: There are only two exceptions to the rule of not receiving pain medications from other Healthcare Providers. 3. Exception #1 (Emergencies): In the event of an emergency (i.e.: accident requiring emergency care), you are allowed  to receive additional pain medication. However, you are responsible for: As soon as you are able, call our office [phone], at any time of the day or night, and leave a message stating your name, the date and nature of the emergency, and the name and dose of the medication prescribed. In the event that your call is answered by a member of our staff, make sure to document and save the date, time, and the name of the person that took your information.  4. Exception #2 (Planned Surgery): In the event that you are scheduled by another doctor or dentist to have any type of surgery or procedure, you are allowed (for a period no longer than 30 days), to receive additional pain medication, for the acute post-op pain. However, in this case, you are responsible for picking up a copy of our "Post-op Pain Management for Surgeons" handout, and giving it to your surgeon or dentist. This document is available at our office, and does not require an appointment to obtain it. Simply go to our office during business hours (Monday-Thursday from 8:00 AM to 4:00 PM) (Friday 8:00 AM to 12:00 Noon) or if you have a scheduled appointment with [place], prior to your surgery, and ask for it by name. In addition, you will need to provide [place] with your name, name of your surgeon, type of surgery, and date of procedure or surgery.  *Opioid medications include: morphine, codeine, oxycodone, oxymorphone, hydrocodone, hydromorphone, meperidine, tramadol, tapentadol, buprenorphine, fentanyl, methadone. **Benzodiazepine medications include: diazepam (Valium), alprazolam (Xanax), clonazepam (Klonopine), lorazepam (Ativan),  clorazepate (Tranxene), chlordiazepoxide (Librium), estazolam (Prosom), oxazepam (Serax), temazepam (Restoril), triazolam (Halcion) (Last updated:  07/03/2017) ____________________________________________________________________________________________   ____________________________________________________________________________________________  Medication Rules  Applies to: All patients receiving prescriptions (written or electronic).  Pharmacy of record: Pharmacy where electronic prescriptions will be sent. If written prescriptions are taken to a different pharmacy, please inform the nursing staff. The pharmacy listed in the electronic medical record should be the one where you would like electronic prescriptions to be sent.  Prescription refills: Only during scheduled appointments. Applies to both, written and electronic prescriptions.  NOTE: The following applies primarily to controlled substances (Opioid* Pain Medications).   Patient's responsibilities: 21. Pain Pills: Bring all pain pills to every appointment (except for procedure appointments). 22. Pill Bottles: Bring pills in original pharmacy bottle. Always bring newest bottle. Bring bottle, even if empty. 23. Medication refills: You are responsible for knowing and keeping track of what medications you need refilled. The day before your appointment, write a list of all prescriptions that need to be refilled. Bring that list to your appointment and give it to the admitting nurse. Prescriptions will be written only during appointments. If you forget a medication, it will not be "Called in", "Faxed", or "electronically sent". You will need to get another appointment to get these prescribed. 24. Prescription Accuracy: You are responsible for carefully inspecting your prescriptions before leaving our office. Have the discharge nurse carefully go over each prescription with you, before taking them home. Make sure that your name is accurately spelled, that your address is correct. Check the name and dose of your medication to make sure it is accurate. Check the number of pills, and the  written instructions to make sure they are clear and accurate. Make sure that you are given enough medication to last until your next medication refill appointment. 25. Taking Medication: Take medication as prescribed. Never take more pills than instructed. Never take medication more frequently than prescribed. Taking less pills or less frequently is permitted and encouraged, when it comes to controlled substances (written prescriptions).  26. Inform other Doctors: Always inform, all of your healthcare providers, of all the medications you take. 27. Pain Medication from other Providers: You are not allowed to accept any additional pain medication from any other Doctor or Healthcare provider. There are two exceptions to this rule. (see below) In the event that you require additional pain medication, you are responsible for notifying us, as stated below. 28. Medication Agreement: You are responsible for carefully reading and following our Medication Agreement. This must be signed before receiving any prescriptions from our practice. Safely store a copy of your signed Agreement. Violations to the Agreement will result in no further prescriptions. (Additional copies of our Medication Agreement are available upon request.) 29. Laws, Rules, & Regulations: All patients are expected to follow all Federal and [employer], [employer], Rules, [employer]. Ignorance of the Laws does not constitute a valid excuse. The use of any illegal substances is prohibited. 51. Adopted CDC guidelines & recommendations: Target dosing levels will be at or below 60 MME/day. Use of benzodiazepines** is not recommended.  Exceptions: There are only two exceptions to the rule of not receiving pain medications from other Healthcare Providers. 5. Exception #1 (Emergencies): In the event of an emergency (i.e.: accident requiring emergency care), you are allowed to receive additional pain medication. However, you are responsible for: As soon  as you are able, call our office [phone], at any time of the day or night, and leave a  message stating your name, the date and nature of the emergency, and the name and dose of the medication prescribed. In the event that your call is answered by a member of our staff, make sure to document and save the date, time, and the name of the person that took your information.  6. Exception #2 (Planned Surgery): In the event that you are scheduled by another doctor or dentist to have any type of surgery or procedure, you are allowed (for a period no longer than 30 days), to receive additional pain medication, for the acute post-op pain. However, in this case, you are responsible for picking up a copy of our "Post-op Pain Management for Surgeons" handout, and giving it to your surgeon or dentist. This document is available at our office, and does not require an appointment to obtain it. Simply go to our office during business hours (Monday-Thursday from 8:00 AM to 4:00 PM) (Friday 8:00 AM to 12:00 Noon) or if you have a scheduled appointment with [place], prior to your surgery, and ask for it by name. In addition, you will need to provide [place] with your name, name of your surgeon, type of surgery, and date of procedure or surgery.  *Opioid medications include: morphine, codeine, oxycodone, oxymorphone, hydrocodone, hydromorphone, meperidine, tramadol, tapentadol, buprenorphine, fentanyl, methadone. **Benzodiazepine medications include: diazepam (Valium), alprazolam (Xanax), clonazepam (Klonopine), lorazepam (Ativan), clorazepate (Tranxene), chlordiazepoxide (Librium), estazolam (Prosom), oxazepam (Serax), temazepam (Restoril), triazolam (Halcion) (Last updated: 07/03/2017) ____________________________________________________________________________________________

## 2017-11-10 NOTE — Progress Notes (Signed)
Nursing Pain Medication Assessment:  Safety precautions to be maintained throughout the outpatient stay will include: orient to surroundings, keep bed in low position, maintain call bell within reach at all times, provide assistance with transfer out of bed and ambulation.  Medication Inspection Compliance: Pill count conducted under aseptic conditions, in front of the patient. Neither the pills nor the bottle was removed from the patient's sight at any time. Once count was completed pills were immediately returned to the patient in their original bottle.  Medication: Oxycodone IR Pill/Patch Count: 22 of 90 pills remain Pill/Patch Appearance: Markings consistent with prescribed medication Bottle Appearance: Standard pharmacy container. Clearly labeled. Filled Date: 03 / 18 / 2019 Last Medication intake:  Yesterday

## 2017-11-10 NOTE — Patient Instructions (Addendum)
_You have been given 3 prescriptions for Oxycodone. Flexeril and Gapapentin to your pharmacy. ___________________________________________________________________________________________  Medication Rules  Applies to: All patients receiving prescriptions (written or electronic).  Pharmacy of record: Pharmacy where electronic prescriptions will be sent. If written prescriptions are taken to a different pharmacy, please inform the nursing staff. The pharmacy listed in the electronic medical record should be the one where you would like electronic prescriptions to be sent.  Prescription refills: Only during scheduled appointments. Applies to both, written and electronic prescriptions.  NOTE: The following applies primarily to controlled substances (Opioid* Pain Medications).   Patient's responsibilities: 1. Pain Pills: Bring all pain pills to every appointment (except for procedure appointments). 2. Pill Bottles: Bring pills in original pharmacy bottle. Always bring newest bottle. Bring bottle, even if empty. 3. Medication refills: You are responsible for knowing and keeping track of what medications you need refilled. The day before your appointment, write a list of all prescriptions that need to be refilled. Bring that list to your appointment and give it to the admitting nurse. Prescriptions will be written only during appointments. If you forget a medication, it will not be "Called in", "Faxed", or "electronically sent". You will need to get another appointment to get these prescribed. 4. Prescription Accuracy: You are responsible for carefully inspecting your prescriptions before leaving our office. Have the discharge nurse carefully go over each prescription with you, before taking them home. Make sure that your name is accurately spelled, that your address is correct. Check the name and dose of your medication to make sure it is accurate. Check the number of pills, and the written instructions to  make sure they are clear and accurate. Make sure that you are given enough medication to last until your next medication refill appointment. 5. Taking Medication: Take medication as prescribed. Never take more pills than instructed. Never take medication more frequently than prescribed. Taking less pills or less frequently is permitted and encouraged, when it comes to controlled substances (written prescriptions).  6. Inform other Doctors: Always inform, all of your healthcare providers, of all the medications you take. 7. Pain Medication from other Providers: You are not allowed to accept any additional pain medication from any other Doctor or Healthcare provider. There are two exceptions to this rule. (see below) In the event that you require additional pain medication, you are responsible for notifying us, as stated below. 8. Medication Agreement: You are responsible for carefully reading and following our Medication Agreement. This must be signed before receiving any prescriptions from our practice. Safely store a copy of your signed Agreement. Violations to the Agreement will result in no further prescriptions. (Additional copies of our Medication Agreement are available upon request.) 9. Laws, Rules, & Regulations: All patients are expected to follow all Federal and [employer], [employer], Rules, [employer]. Ignorance of the Laws does not constitute a valid excuse. The use of any illegal substances is prohibited. 10. Adopted CDC guidelines & recommendations: Target dosing levels will be at or below 60 MME/day. Use of benzodiazepines** is not recommended.  Exceptions: There are only two exceptions to the rule of not receiving pain medications from other Healthcare Providers. 1. Exception #1 (Emergencies): In the event of an emergency (i.e.: accident requiring emergency care), you are allowed to receive additional pain medication. However, you are responsible for: As soon as you are able, call our  office [phone], at any time of the day or night, and leave a message stating your name, the  date and nature of the emergency, and the name and dose of the medication prescribed. In the event that your call is answered by a member of our staff, make sure to document and save the date, time, and the name of the person that took your information.  2. Exception #2 (Planned Surgery): In the event that you are scheduled by another doctor or dentist to have any type of surgery or procedure, you are allowed (for a period no longer than 30 days), to receive additional pain medication, for the acute post-op pain. However, in this case, you are responsible for picking up a copy of our "Post-op Pain Management for Surgeons" handout, and giving it to your surgeon or dentist. This document is available at our office, and does not require an appointment to obtain it. Simply go to our office during business hours (Monday-Thursday from 8:00 AM to 4:00 PM) (Friday 8:00 AM to 12:00 Noon) or if you have a scheduled appointment with [place], prior to your surgery, and ask for it by name. In addition, you will need to provide [place] with your name, name of your surgeon, type of surgery, and date of procedure or surgery.  *Opioid medications include: morphine, codeine, oxycodone, oxymorphone, hydrocodone, hydromorphone, meperidine, tramadol, tapentadol, buprenorphine, fentanyl, methadone. **Benzodiazepine medications include: diazepam (Valium), alprazolam (Xanax), clonazepam (Klonopine), lorazepam (Ativan), clorazepate (Tranxene), chlordiazepoxide (Librium), estazolam (Prosom), oxazepam (Serax), temazepam (Restoril), triazolam (Halcion) (Last updated: 07/03/2017) ____________________________________________________________________________________________   ____________________________________________________________________________________________  Medication Rules  Applies to: All patients receiving prescriptions (written or  electronic).  Pharmacy of record: Pharmacy where electronic prescriptions will be sent. If written prescriptions are taken to a different pharmacy, please inform the nursing staff. The pharmacy listed in the electronic medical record should be the one where you would like electronic prescriptions to be sent.  Prescription refills: Only during scheduled appointments. Applies to both, written and electronic prescriptions.  NOTE: The following applies primarily to controlled substances (Opioid* Pain Medications).   Patient's responsibilities: 11. Pain Pills: Bring all pain pills to every appointment (except for procedure appointments). 12. Pill Bottles: Bring pills in original pharmacy bottle. Always bring newest bottle. Bring bottle, even if empty. 13. Medication refills: You are responsible for knowing and keeping track of what medications you need refilled. The day before your appointment, write a list of all prescriptions that need to be refilled. Bring that list to your appointment and give it to the admitting nurse. Prescriptions will be written only during appointments. If you forget a medication, it will not be "Called in", "Faxed", or "electronically sent". You will need to get another appointment to get these prescribed. 14. Prescription Accuracy: You are responsible for carefully inspecting your prescriptions before leaving our office. Have the discharge nurse carefully go over each prescription with you, before taking them home. Make sure that your name is accurately spelled, that your address is correct. Check the name and dose of your medication to make sure it is accurate. Check the number of pills, and the written instructions to make sure they are clear and accurate. Make sure that you are given enough medication to last until your next medication refill appointment. 15. Taking Medication: Take medication as prescribed. Never take more pills than instructed. Never take medication more  frequently than prescribed. Taking less pills or less frequently is permitted and encouraged, when it comes to controlled substances (written prescriptions).  16. Inform other Doctors: Always inform, all of your healthcare providers, of all the medications you take. 17. Pain Medication  from other Providers: You are not allowed to accept any additional pain medication from any other Doctor or Healthcare provider. There are two exceptions to this rule. (see below) In the event that you require additional pain medication, you are responsible for notifying us, as stated below. 18. Medication Agreement: You are responsible for carefully reading and following our Medication Agreement. This must be signed before receiving any prescriptions from our practice. Safely store a copy of your signed Agreement. Violations to the Agreement will result in no further prescriptions. (Additional copies of our Medication Agreement are available upon request.) 19. Laws, Rules, & Regulations: All patients are expected to follow all Federal and [employer], [employer], Rules, [employer]. Ignorance of the Laws does not constitute a valid excuse. The use of any illegal substances is prohibited. 20. Adopted CDC guidelines & recommendations: Target dosing levels will be at or below 60 MME/day. Use of benzodiazepines** is not recommended.  Exceptions: There are only two exceptions to the rule of not receiving pain medications from other Healthcare Providers. 3. Exception #1 (Emergencies): In the event of an emergency (i.e.: accident requiring emergency care), you are allowed to receive additional pain medication. However, you are responsible for: As soon as you are able, call our office [phone], at any time of the day or night, and leave a message stating your name, the date and nature of the emergency, and the name and dose of the medication prescribed. In the event that your call is answered by a member of our staff, make sure  to document and save the date, time, and the name of the person that took your information.  4. Exception #2 (Planned Surgery): In the event that you are scheduled by another doctor or dentist to have any type of surgery or procedure, you are allowed (for a period no longer than 30 days), to receive additional pain medication, for the acute post-op pain. However, in this case, you are responsible for picking up a copy of our "Post-op Pain Management for Surgeons" handout, and giving it to your surgeon or dentist. This document is available at our office, and does not require an appointment to obtain it. Simply go to our office during business hours (Monday-Thursday from 8:00 AM to 4:00 PM) (Friday 8:00 AM to 12:00 Noon) or if you have a scheduled appointment with [place], prior to your surgery, and ask for it by name. In addition, you will need to provide [place] with your name, name of your surgeon, type of surgery, and date of procedure or surgery.  *Opioid medications include: morphine, codeine, oxycodone, oxymorphone, hydrocodone, hydromorphone, meperidine, tramadol, tapentadol, buprenorphine, fentanyl, methadone. **Benzodiazepine medications include: diazepam (Valium), alprazolam (Xanax), clonazepam (Klonopine), lorazepam (Ativan), clorazepate (Tranxene), chlordiazepoxide (Librium), estazolam (Prosom), oxazepam (Serax), temazepam (Restoril), triazolam (Halcion) (Last updated: 07/03/2017) ____________________________________________________________________________________________   ____________________________________________________________________________________________  Medication Rules  Applies to: All patients receiving prescriptions (written or electronic).  Pharmacy of record: Pharmacy where electronic prescriptions will be sent. If written prescriptions are taken to a different pharmacy, please inform the nursing staff. The pharmacy listed in the electronic medical record should be the one where  you would like electronic prescriptions to be sent.  Prescription refills: Only during scheduled appointments. Applies to both, written and electronic prescriptions.  NOTE: The following applies primarily to controlled substances (Opioid* Pain Medications).   Patient's responsibilities: 21. Pain Pills: Bring all pain pills to every appointment (except for procedure appointments). 22. Pill Bottles: Bring pills in original pharmacy bottle. Always bring newest  bottle. Bring bottle, even if empty. 23. Medication refills: You are responsible for knowing and keeping track of what medications you need refilled. The day before your appointment, write a list of all prescriptions that need to be refilled. Bring that list to your appointment and give it to the admitting nurse. Prescriptions will be written only during appointments. If you forget a medication, it will not be "Called in", "Faxed", or "electronically sent". You will need to get another appointment to get these prescribed. 24. Prescription Accuracy: You are responsible for carefully inspecting your prescriptions before leaving our office. Have the discharge nurse carefully go over each prescription with you, before taking them home. Make sure that your name is accurately spelled, that your address is correct. Check the name and dose of your medication to make sure it is accurate. Check the number of pills, and the written instructions to make sure they are clear and accurate. Make sure that you are given enough medication to last until your next medication refill appointment. 25. Taking Medication: Take medication as prescribed. Never take more pills than instructed. Never take medication more frequently than prescribed. Taking less pills or less frequently is permitted and encouraged, when it comes to controlled substances (written prescriptions).  26. Inform other Doctors: Always inform, all of your healthcare providers, of all the medications you  take. 27. Pain Medication from other Providers: You are not allowed to accept any additional pain medication from any other Doctor or Healthcare provider. There are two exceptions to this rule. (see below) In the event that you require additional pain medication, you are responsible for notifying us, as stated below. 28. Medication Agreement: You are responsible for carefully reading and following our Medication Agreement. This must be signed before receiving any prescriptions from our practice. Safely store a copy of your signed Agreement. Violations to the Agreement will result in no further prescriptions. (Additional copies of our Medication Agreement are available upon request.) 29. Laws, Rules, & Regulations: All patients are expected to follow all Federal and [employer], [employer], Rules, [employer]. Ignorance of the Laws does not constitute a valid excuse. The use of any illegal substances is prohibited. 73. Adopted CDC guidelines & recommendations: Target dosing levels will be at or below 60 MME/day. Use of benzodiazepines** is not recommended.  Exceptions: There are only two exceptions to the rule of not receiving pain medications from other Healthcare Providers. 5. Exception #1 (Emergencies): In the event of an emergency (i.e.: accident requiring emergency care), you are allowed to receive additional pain medication. However, you are responsible for: As soon as you are able, call our office [phone], at any time of the day or night, and leave a message stating your name, the date and nature of the emergency, and the name and dose of the medication prescribed. In the event that your call is answered by a member of our staff, make sure to document and save the date, time, and the name of the person that took your information.  6. Exception #2 (Planned Surgery): In the event that you are scheduled by another doctor or dentist to have any type of surgery or procedure, you are allowed (for a  period no longer than 30 days), to receive additional pain medication, for the acute post-op pain. However, in this case, you are responsible for picking up a copy of our "Post-op Pain Management for Surgeons" handout, and giving it to your surgeon or dentist. This document is available at our office,  and does not require an appointment to obtain it. Simply go to our office during business hours (Monday-Thursday from 8:00 AM to 4:00 PM) (Friday 8:00 AM to 12:00 Noon) or if you have a scheduled appointment with [place], prior to your surgery, and ask for it by name. In addition, you will need to provide [place] with your name, name of your surgeon, type of surgery, and date of procedure or surgery.  *Opioid medications include: morphine, codeine, oxycodone, oxymorphone, hydrocodone, hydromorphone, meperidine, tramadol, tapentadol, buprenorphine, fentanyl, methadone. **Benzodiazepine medications include: diazepam (Valium), alprazolam (Xanax), clonazepam (Klonopine), lorazepam (Ativan), clorazepate (Tranxene), chlordiazepoxide (Librium), estazolam (Prosom), oxazepam (Serax), temazepam (Restoril), triazolam (Halcion) (Last updated: 07/03/2017) ____________________________________________________________________________________________

## 2017-11-16 LAB — TOXASSURE SELECT 13 (MW), URINE

## 2017-11-17 ENCOUNTER — Telehealth: Payer: Self-pay | Admitting: General Surgery

## 2017-11-17 NOTE — Telephone Encounter (Signed)
Patient called in wanting to see if she could come in before September.She is stating the area from her hernia has gotten worse & her jeans are beginning to rub it. Please advise

## 2017-11-17 NOTE — Telephone Encounter (Signed)
The patient showed evidence of pulmonary dysfunction with diffuse wheezing at the time of her June 2019 exam.  She should ideally be free of nicotine for 2 months prior to surgery.  If her PCP arrange for pulmonary consultation and her wheezing resolves she can be considered for earlier surgical intervention.

## 2017-11-18 NOTE — Telephone Encounter (Signed)
Spoke with patient about what Dr Bary Castilla recommended. She does see [facility]. She states that she has stopped smoking and is using e-cigarettes. She will have an assessment with Pulmonology and obtain a clearance for surgery from them and then call us back about scheduling her surgery. She will need to be seen by Dr Bary Castilla prior to surgery.

## 2017-12-03 ENCOUNTER — Encounter: Payer: Self-pay | Admitting: Pulmonary Disease

## 2017-12-03 ENCOUNTER — Ambulatory Visit (INDEPENDENT_AMBULATORY_CARE_PROVIDER_SITE_OTHER): Payer: Medicare Other | Admitting: Pulmonary Disease

## 2017-12-03 ENCOUNTER — Telehealth: Payer: Self-pay | Admitting: Pulmonary Disease

## 2017-12-03 VITALS — BP 110/64 | HR 108 | Ht 64.0 in | Wt 124.0 lb

## 2017-12-03 DIAGNOSIS — J439 Emphysema, unspecified: Secondary | ICD-10-CM | POA: Diagnosis not present

## 2017-12-03 DIAGNOSIS — R059 Cough, unspecified: Secondary | ICD-10-CM

## 2017-12-03 DIAGNOSIS — R05 Cough: Secondary | ICD-10-CM | POA: Diagnosis not present

## 2017-12-03 MED ORDER — TIOTROPIUM BROMIDE MONOHYDRATE 2.5 MCG/ACT IN AERS: 2.0000 | INHALATION_SPRAY | Freq: Once | RESPIRATORY_TRACT | 2 refills | Status: DC

## 2017-12-03 MED ORDER — LEVALBUTEROL TARTRATE 45 MCG/ACT IN AERO: 2.0000 | INHALATION_SPRAY | RESPIRATORY_TRACT | 2 refills | Status: DC | PRN

## 2017-12-03 MED ORDER — BENZONATATE 100 MG PO CAPS: 100.0000 mg | ORAL_CAPSULE | Freq: Four times a day (QID) | ORAL | 1 refills | Status: DC | PRN

## 2017-12-03 NOTE — Telephone Encounter (Signed)
Received a PA request for patient's Xopenex. ProAir is preferred by insurance but patient can not tolerate albuterol. PA was processed on [email or web address]. PA has been approved until 05/05/18.   [facility] on [address] is aware of approval. Nothing further needed at time of call.

## 2017-12-03 NOTE — Patient Instructions (Signed)
Chronic obstructive pulmonary disease Chronic cough Wheezing Report of significant improvement in symptoms since quitting smoking  Obtain pulmonary function study Obtain chest x-ray  Continue Spiriva Respimat Continue Xopenex use as needed Tessalon Perles for cough  You are clear for surgery unless there is an exacerbation prior to surgery We will see you back in 3 months
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
## 2017-12-04 ENCOUNTER — Ambulatory Visit (INDEPENDENT_AMBULATORY_CARE_PROVIDER_SITE_OTHER)
Admission: RE | Admit: 2017-12-04 | Discharge: 2017-12-04 | Disposition: A | Discharge status: Home / Self Care | Payer: Medicare Other | Source: Ambulatory Visit | Attending: Pulmonary Disease | Admitting: Pulmonary Disease

## 2017-12-04 DIAGNOSIS — J439 Emphysema, unspecified: Secondary | ICD-10-CM | POA: Diagnosis not present

## 2017-12-04 DIAGNOSIS — R059 Cough, unspecified: Secondary | ICD-10-CM

## 2017-12-04 DIAGNOSIS — R918 Other nonspecific abnormal finding of lung field: Secondary | ICD-10-CM | POA: Diagnosis not present

## 2017-12-04 DIAGNOSIS — R05 Cough: Secondary | ICD-10-CM

## 2017-12-11 ENCOUNTER — Ambulatory Visit (INDEPENDENT_AMBULATORY_CARE_PROVIDER_SITE_OTHER): Payer: Medicare Other | Admitting: General Surgery

## 2017-12-11 ENCOUNTER — Encounter: Payer: Self-pay | Admitting: General Surgery

## 2017-12-11 VITALS — BP 140/80 | HR 105 | Resp 18 | Ht 64.0 in | Wt 123.0 lb

## 2017-12-11 DIAGNOSIS — K439 Ventral hernia without obstruction or gangrene: Secondary | ICD-10-CM

## 2017-12-11 NOTE — Patient Instructions (Addendum)
The patient is aware to call back for any questions or new concerns. Hernia, Adult A hernia is the bulging of an organ or tissue through a weak spot in the muscles of the abdomen (abdominal wall). Hernias develop most often near the navel or groin. There are many kinds of hernias. Common kinds include:  Femoral hernia. This kind of hernia develops under the groin in the upper thigh area.  Inguinal hernia. This kind of hernia develops in the groin or scrotum.  Umbilical hernia. This kind of hernia develops near the navel.  Hiatal hernia. This kind of hernia causes part of the stomach to be pushed up into the chest.  Incisional hernia. This kind of hernia bulges through a scar from an abdominal surgery.  What are the causes? This condition may be caused by:  Heavy lifting.  Coughing over a long period of time.  Straining to have a bowel movement.  An incision made during an abdominal surgery.  A birth defect (congenital defect).  Excess weight or obesity.  Smoking.  Poor nutrition.  Cystic fibrosis.  Excess fluid in the abdomen.  Undescended testicles.  What are the signs or symptoms? Symptoms of a hernia include:  A lump on the abdomen. This is the first sign of a hernia. The lump may become more obvious with standing, straining, or coughing. It may get bigger over time if it is not treated or if the condition causing it is not treated.  Pain. A hernia is usually painless, but it may become painful over time if treatment is delayed. The pain is usually dull and may get worse with standing or lifting heavy objects.  Sometimes a hernia gets tightly squeezed in the weak spot (strangulated) or stuck there (incarcerated) and causes additional symptoms. These symptoms may include:  Vomiting.  Nausea.  Constipation.  Irritability.  How is this diagnosed? A hernia may be diagnosed with:  A physical exam. During the exam your health care provider may ask you to cough  or to make a specific movement, because a hernia is usually more visible when you move.  Imaging tests. These can include: ? X-rays. ? Ultrasound. ? CT scan.  How is this treated? A hernia that is small and painless may not need to be treated. A hernia that is large or painful may be treated with surgery. Inguinal hernias may be treated with surgery to prevent incarceration or strangulation. Strangulated hernias are always treated with surgery, because lack of blood to the trapped organ or tissue can cause it to die. Surgery to treat a hernia involves pushing the bulge back into place and repairing the weak part of the abdomen. Follow these instructions at home:  Avoid straining.  Do not lift anything heavier than 10 lb (4.5 kg).  Lift with your leg muscles, not your back muscles. This helps avoid strain.  When coughing, try to cough gently.  Prevent constipation. Constipation leads to straining with bowel movements, which can make a hernia worse or cause a hernia repair to break down. You can prevent constipation by: ? Eating a high-fiber diet that includes plenty of fruits and vegetables. ? Drinking enough fluids to keep your urine clear or pale yellow. Aim to drink 6-8 glasses of water per day. ? Using a stool softener as directed by your health care provider.  Lose weight, if you are overweight.  Do not use any tobacco products, including cigarettes, chewing tobacco, or electronic cigarettes. If you need help quitting, ask your health  care provider.  Keep all follow-up visits as directed by your health care provider. This is important. Your health care provider may need to monitor your condition. Contact a health care provider if:  You have swelling, redness, and pain in the affected area.  Your bowel habits change. Get help right away if:  You have a fever.  You have abdominal pain that is getting worse.  You feel nauseous or you vomit.  You cannot push the hernia back  in place by gently pressing on it while you are lying down.  The hernia: ? Changes in shape or size. ? Is stuck outside the abdomen. ? Becomes discolored. ? Feels hard or tender. This information is not intended to replace advice given to you by your health care provider. Make sure you discuss any questions you have with your health care provider. Document Released: 04/22/2005 Document Revised: 09/20/2015 Document Reviewed: 03/02/2014 Elsevier Interactive Patient Education  2017 [employer].   The patient is scheduled for surgery at [facility] on 12/26/17. She will pre admit by phone. Pulmonary clearance from [facility] done 12/03/17. The patient is aware of date and instructions.
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
## 2017-12-18 DIAGNOSIS — I361 Nonrheumatic tricuspid (valve) insufficiency: Secondary | ICD-10-CM | POA: Diagnosis not present

## 2017-12-18 DIAGNOSIS — Z79899 Other long term (current) drug therapy: Secondary | ICD-10-CM | POA: Diagnosis not present

## 2017-12-18 DIAGNOSIS — I6523 Occlusion and stenosis of bilateral carotid arteries: Secondary | ICD-10-CM | POA: Diagnosis not present

## 2017-12-18 DIAGNOSIS — Z118 Encounter for screening for other infectious and parasitic diseases: Secondary | ICD-10-CM | POA: Diagnosis not present

## 2017-12-18 DIAGNOSIS — Z112 Encounter for screening for other bacterial diseases: Secondary | ICD-10-CM | POA: Diagnosis not present

## 2017-12-18 DIAGNOSIS — I340 Nonrheumatic mitral (valve) insufficiency: Secondary | ICD-10-CM | POA: Diagnosis not present

## 2017-12-18 DIAGNOSIS — R011 Cardiac murmur, unspecified: Secondary | ICD-10-CM | POA: Diagnosis not present

## 2017-12-18 DIAGNOSIS — N390 Urinary tract infection, site not specified: Secondary | ICD-10-CM | POA: Diagnosis not present

## 2017-12-18 DIAGNOSIS — R300 Dysuria: Secondary | ICD-10-CM | POA: Diagnosis not present

## 2017-12-19 ENCOUNTER — Encounter
Admission: RE | Admit: 2017-12-19 | Discharge: 2017-12-19 | Disposition: A | Discharge status: Home / Self Care | Payer: Medicare Other | Source: Ambulatory Visit | Attending: General Surgery | Admitting: General Surgery

## 2017-12-19 ENCOUNTER — Telehealth: Payer: Self-pay | Admitting: Pulmonary Disease

## 2017-12-19 ENCOUNTER — Other Ambulatory Visit: Payer: Self-pay

## 2017-12-19 HISTORY — DX: Unspecified osteoarthritis, unspecified site: M19.90

## 2017-12-19 HISTORY — DX: Ventricular premature depolarization: I49.3

## 2017-12-19 HISTORY — DX: Dyspnea, unspecified: R06.00

## 2017-12-19 MED ORDER — DEXTROMETHORPHAN POLISTIREX ER 30 MG/5ML PO SUER: 30.0000 mg | Freq: Two times a day (BID) | ORAL | 0 refills | Status: DC

## 2017-12-19 NOTE — Telephone Encounter (Signed)
Spoke with pt. States that she is still having issues with coughing at night time. Cough is non productive and wakes her from her sleep. Pt has been taking Tessalon with no relief. Denies chest tightness, wheezing or SOB. Would like to have a prescription for cough syrup.  Dr. Ander Slade - please advise. Thanks.

## 2017-12-19 NOTE — Patient Instructions (Signed)
Your procedure is scheduled on: 12-26-17 FRIDAY Report to Same Day Surgery 2nd floor medical mall [facility] Entrance-take elevator on left to 2nd floor.  Check in with surgery information desk.) To find out your arrival time please call [phone] between 1PM - 3PM on 12-25-17 THURSDAY  Remember: Instructions that are not followed completely may result in serious medical risk, up to and including death, or upon the discretion of your surgeon and anesthesiologist your surgery may need to be rescheduled.    _x___ 1. Do not eat food after midnight the night before your procedure. NO GUM OR CANDY AFTER MIDNIGHT.  You may drink clear liquids up to 2 hours before you are scheduled to arrive at the hospital for your procedure.  Do not drink clear liquids within 2 hours of your scheduled arrival to the hospital.  Clear liquids include  --Water or Apple juice without pulp  --Clear carbohydrate beverage such as ClearFast or Gatorade  --Black Coffee or Clear Tea (No milk, no creamers, do not add anything to the coffee or Tea   ____Ensure clear carbohydrate drink on the way to the hospital for bariatric patients  ____Ensure clear carbohydrate drink 3 hours before surgery for Dr Dwyane Luo patients if physician instructed.     __x__ 2. No Alcohol for 24 hours before or after surgery.   __x__3. No Smoking or e-cigarettes for 24 prior to surgery.  Do not use any chewable tobacco products for at least 6 hour prior to surgery   ____  4. Bring all medications with you on the day of surgery if instructed.    __x__ 5. Notify your doctor if there is any change in your medical condition     (cold, fever, infections).    x___6. On the morning of surgery brush your teeth with toothpaste and water.  You may rinse your mouth with mouth wash if you wish.  Do not swallow any toothpaste or mouthwash.   Do not wear jewelry, make-up, hairpins, clips or nail polish.  Do not wear lotions, powders, or perfumes.  You may wear deodorant.  Do not shave 48 hours prior to surgery. Men may shave face and neck.  Do not bring valuables to the hospital.    [facility] is not responsible for any belongings or valuables.               Contacts, dentures or bridgework may not be worn into surgery.  Leave your suitcase in the car. After surgery it may be brought to your room.  For patients admitted to the hospital, discharge time is determined by your treatment team.  _  Patients discharged the day of surgery will not be allowed to drive home.  You will need someone to drive you home and stay with you the night of your procedure.    Please read over the following fact sheets that you were given:   [facility] Preparing for Surgery   _x___ TAKE THE FOLLOWING MEDICATION THE MORNING OF SURGERY WITH A SMALL SIP OF WATER. These include:  1. FLEXERIL (CYCLOBENAZEPRINE)  2.  GABAPENTIN (NEURONTIN)  3. OXYCODONE   4.  5.  6.  ____Fleets enema or Magnesium Citrate as directed.   _x___ Use CHG Soap or sage wipes as directed on instruction sheet   _X___ Use inhalers on the day of surgery and bring to hospital day of [place]  ____ Stop Metformin  and Janumet 2 days prior to surgery.    ____ Take 1/2 of usual insulin dose the night before surgery and none on the morning surgery.   ____ Follow recommendations from Cardiologist, Pulmonologist or PCP regarding stopping Aspirin, Coumadin, Plavix ,Eliquis, Effient, or Pradaxa, and Pletal.  ____Stop Anti-inflammatories such as Advil, Aleve, Ibuprofen, Motrin, Naproxen, Naprosyn, Goodies powders or aspirin products . OK to take Tylenol    __X__ Stop supplements until after surgery-STOP BIOTIN (HAIR, SKIN AND NAILS) NOW-MAY RESUME AFTER SURGERY   ____ Bring C-Pap to the hospital.

## 2017-12-19 NOTE — Telephone Encounter (Signed)
Called and spoke with pt regarding OL recommendations Pt verbalized understanding Nothing further needed

## 2017-12-19 NOTE — Telephone Encounter (Signed)
Can stop Tessalon  Delsym 30 mg twice daily for cough  It is an over-the-counter medication All the other medications she will be able to use for the cough are over-the-counter

## 2017-12-22 ENCOUNTER — Encounter
Admission: RE | Admit: 2017-12-22 | Discharge: 2017-12-22 | Disposition: A | Discharge status: Home / Self Care | Payer: Medicare Other | Source: Ambulatory Visit | Attending: General Surgery | Admitting: General Surgery

## 2017-12-22 DIAGNOSIS — Z01812 Encounter for preprocedural laboratory examination: Secondary | ICD-10-CM | POA: Diagnosis not present

## 2017-12-22 DIAGNOSIS — J449 Chronic obstructive pulmonary disease, unspecified: Secondary | ICD-10-CM | POA: Diagnosis not present

## 2017-12-22 DIAGNOSIS — Z01810 Encounter for preprocedural cardiovascular examination: Secondary | ICD-10-CM | POA: Insufficient documentation

## 2017-12-22 LAB — BASIC METABOLIC PANEL
Anion gap: 5 (ref 5–15)
BUN: 20 mg/dL (ref 6–20)
CALCIUM: 9.1 mg/dL (ref 8.9–10.3)
CO2: 23 mmol/L (ref 22–32)
Chloride: 110 mmol/L (ref 98–111)
Creatinine, Ser: 1.25 mg/dL — ABNORMAL HIGH (ref 0.44–1.00)
GFR calc Af Amer: 53 mL/min — ABNORMAL LOW (ref >60)
GFR, EST NON AFRICAN AMERICAN: 46 mL/min — AB (ref >60)
Glucose, Bld: 86 mg/dL (ref 70–99)
Potassium: 4.1 mmol/L (ref 3.5–5.1)
SODIUM: 138 mmol/L (ref 135–145)

## 2017-12-22 LAB — CBC
HCT: 34 % — ABNORMAL LOW (ref 35.0–47.0)
Hemoglobin: 11.7 g/dL — ABNORMAL LOW (ref 12.0–16.0)
MCH: 30.8 pg (ref 26.0–34.0)
MCHC: 34.5 g/dL (ref 32.0–36.0)
MCV: 89.3 fL (ref 80.0–100.0)
Platelets: 228 10*3/uL (ref 150–440)
RBC: 3.81 MIL/uL (ref 3.80–5.20)
RDW: 14.3 % (ref 11.5–14.5)
WBC: 6.7 10*3/uL (ref 3.6–11.0)

## 2017-12-25 MED ORDER — CEFAZOLIN SODIUM-DEXTROSE 2-4 GM/100ML-% IV SOLN
2.0000 g | INTRAVENOUS | Status: AC
  Administered 2017-12-26: 2 g via INTRAVENOUS
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
## 2017-12-25 NOTE — Pre-Procedure Instructions (Signed)
Progress Notes - in this encounter  Isaias Cowman, MD - 09/25/2016 9:45 AM EDT Formatting of this note might be different from the original. Established Patient Visit   Chief Complaint: Chief Complaint  Patient presents with  . Follow-up  echo/holter  Date of Service: 09/25/2016 Date of Birth: [birth year] PCP: [name], NP  History of Present Illness: Ms. Gorman is a 57 y.o.female patient who returns for orthostatic hypotension, premature ventricular contractions, hyperlipidemia. Patient denies chest pain. She has chronic exertional dyspnea due to underlying COPD and ongoing tobacco abuse. The patient smokes a half pack of cigarettes per day. She denies peripheral edema. The patient reports a history of postural lightheadedness and dizziness without presyncope or syncope. 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 24-hour Holter monitor was performed which revealed predominant normal sinus rhythm with a mean heart rate of 91 bpm with occasional PVCs and 1 7 beat atrial run. 2D echocardiogram 06/23/2016 revealed normal left ventricular function, with LVEF of 50%, with mild mitral and tricuspid regurgitation.  The patient has a history of hyperlipidemia, on pravastatin, which is well tolerated without apparent side effects, followed by her primary care provider. The patient follows a low-cholesterol, low-fat diet.  Past Medical and Surgical History  Past Medical History Past Medical History:  Diagnosis Date  . Hyperlipidemia, unspecified   Past Surgical History She has a past surgical history that includes Hysterectomy Vaginal; Cesarean section; back surgery; and Cardiac catheterization.   Medications and Allergies  Current Medications  Current Outpatient Prescriptions  Medication Sig Dispense Refill  . alendronate (FOSAMAX) 70 MG tablet 70 mg once a week.   Marland Kitchen ascorbic acid, vitamin C, (VITAMIN C) 500 MG tablet Take 500 mg by mouth.  . benzonatate (TESSALON) 100 MG capsule Take 100 mg by mouth 3 (three) times daily as needed for Cough.  . calcium carbonate 600 mg calcium (1,500 mg) Tab tablet Take by mouth.  . cholecalciferol, vitamin D3, 5,000 unit Tab Take by mouth.  . cyanocobalamin (VITAMIN B12) 500 MCG tablet Take by mouth.  . gabapentin (NEURONTIN) 100 MG capsule  . HYDROcodone-acetaminophen (NORCO) 10-325 mg tablet  . inhalational spacer (AEROCHAMBER MV) spacer Use as instructed with Symibcort  . mucus clearing device (FLUTTER) Devi Use as directed.  . orphenadrine (NORFLEX) 100 mg ER tablet 4 (four) times daily as needed.  . potassium chloride (K-DUR,KLOR-CON) 20 MEQ ER tablet  . pravastatin (PRAVACHOL) 20 MG tablet  . promethazine (PHENERGAN) 25 MG tablet  . traZODone (DESYREL) 100 MG tablet  . zolpidem (AMBIEN) 5 MG tablet   No current facility-administered medications for this visit.   Allergies: Other; Tramadol hcl; Flagyl [metronidazole hcl]; Metronidazole; Nsaids (non-steroidal anti-inflammatory drug); and Varenicline  Social and Family History  Social History reports that she has been smoking Cigarettes. She has been smoking about 0.50 packs per day. She has never used smokeless tobacco. She reports that she does not drink alcohol or use drugs.  Family History Family History  Problem Relation Age of Onset  . Liver cancer Mother  . Cancer Father   Review of Systems   Review of Systems: The patient denies chest pain, shortness of breath, orthopnea, paroxysmal nocturnal dyspnea, pedal edema, palpitations, heart racing, presyncope, syncope. Review of 12 Systems is negative except as described above.  Physical Examination   Vitals:BP 112/62  Pulse 84  Ht 162.6 cm (5\' 4" )  Wt 50.8  kg (112 lb)  BMI 19.22 kg/m  Ht:162.6 cm (5\' 4" ) Wt:50.8 kg (112 lb) UXN:ATFT surface area is 1.51 meters squared. Body mass index is  19.22 kg/m.  HEENT: Pupils equally reactive to light and accomodation  Neck: Supple without thyromegaly, carotid pulses 2+ Lungs: clear to auscultation bilaterally; no wheezes, rales, rhonchi Heart: Regular rate and rhythm. No gallops, murmurs or rub Abdomen: soft nontender, nondistended, with normal bowel sounds Extremities: no cyanosis, clubbing, or edema Peripheral Pulses: 2+ in all extremities, 2+ femoral pulses bilaterally  Assessment   57 y.o. female with  1. Essential hypertension  2. SOB (shortness of breath) on exertion  3. Pure hypercholesterolemia  4. 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 The patient has exertional dyspnea, likely due to underlying COPD and ongoing tobacco abuse.  Plan   1. Continue current medications 2. Counseled patient about low-cholesterol diet 3. Continue pravastatin for hyperlipidemia management 4. Low-fat and cholesterol diet printed instructions given to the patient 5. Encouraged patient stop smoking 6. No further cardiac diagnostics at this time 7. Return to clinic for follow-up in 6 months  No orders of the defined types were placed in this encounter.  Return in about 6 months (around 03/28/2017).  Isaias Cowman, MD PhD [facility]    Plan of Treatment - as of this encounter  Upcoming Encounters Upcoming Encounters  Date Type Specialty Care Team Description  04/01/2017 Office Visit Cardiology Isaias Cowman, MD  [place]  [facility]  [place], [place] [address]  [phone]  [phone] (Fax)    Visit Diagnoses - in this encounter  Diagnosis  Essential hypertension - Primary   SOB (shortness of breath) on exertion  Shortness of breath   Pure hypercholesterolemia   History of tobacco use  Personal  history of tobacco use, presenting hazards to health   Images Patient Contacts   Contact Name Contact Address Communication Relationship to Patient  [address], [place] [address] [phone] (Home) Spouse, Guardian  Document Information  Primary Care Provider Other Service Providers Document Coverage Dates  [name], NP (May 23, 2018May 23, 2018 - Present) DM: [phone] (Work) [phone] (Fax) [place], [place] [address] Family Medicine  May 23, 2018May 23, 2018   [place] [address] [place], [place] [address]   Encounter Providers Encounter Date  Isaias Cowman, MD (Attending) [phone] (Work) [phone] (Fax) [place] [facility] [place],  [id number] Cardiovascular Disease May 23, 2018May 23, 2018

## 2017-12-25 NOTE — Pre-Procedure Instructions (Signed)
Echo complete5/18/2018 [place] Component Name Value Ref Range  LV Ejection Fraction (%) 50   Aortic Valve Stenosis Grade none   Aortic Valve Regurgitation Grade none   Aortic Valve Max Velocity (m/s) 1.2 m/sec   Mitral Valve Stenosis Grade none   Mitral Valve Regurgitation Grade mild   Tricuspid Valve Regurgitation Grade mild   Tricuspid Valve Regurgitation Max Velocity (m/s) 2.1 m/sec   Right Ventricle Systolic Pressure (mmHg) 93.2 mmHg   LV End Diastolic Diameter (cm) 3.7 cm  LV End Systolic Diameter (cm) 2.7 cm  LV Septum Wall Thickness (cm) 0.95 cm  LV Posterior Wall Thickness (cm) 0.84 cm  Left Atrium Diameter (cm) 3.2 cm  Result Narrative   [facility] [id number]  [facility] Acct #: [id number]  [address] [place], [place] [address] Date: 09/20/2016 12:09 PM Adult Female Age: 57 yrs  ECHOCARDIOGRAM REPORTOutpatient KC::KCWC  STUDY:CHEST WALL TAPE: MD1:PARASCHOS, ALEXANDER ECHO:Yes DOPPLER:YesFILE: BP: 62/ mmHg  COLOR:YesCONTRAST:NoMACHINE:PhilipsHR: 122  RV BIOPSY:No 3D:No SOUND QLTY:Moderate Height: 64 in MEDIUM:None Weight: 112 lb BSA: 1.5 m2  ___________________________________________________________________________________________ HISTORY:DOE  REASON:Assess, LV function  INDICATION:SOB (shortness of breath) on exertion [R06.02  (ICD-10-CM)] ___________________________________________________________________________________________  ECHOCARDIOGRAPHIC MEASUREMENTS 2D DIMENSIONS AORTA ValuesNormal RangeMAIN PAValuesNormal Range Annulus:1.7 cm[2.1 - 2.5]PA Main:nm* [1.5 - 2.1] Aorta Sin:2.3 cm[2.7 - 3.3] RIGHT VENTRICLE ST Junction:nm* [2.3 - 2.9]RV Base:nm* [ < 4.2] Asc.Aorta:nm* [2.3 - 3.1] RV Mid:nm* [ < 3.5]  LEFT VENTRICLERV Length:1.7 cm[ < 8.6] LVIDd:3.7 cm[3.9 - 5.3] INFERIOR VENA CAVA LVIDs:2.7 cmMax. IVC:nm* [ <= 2.1]  FS:26.1 %[> 25]Min. IVC:nm* SWT:0.95 cm [0.5 - 0.9] ------------------ PWT:0.84 cm [0.5 - 0.9] nm* - not measured  LEFT ATRIUM LA Diam:3.2 cm[2.7 - 3.8] LA A4C Area:nm* [ < 20] LA Volume:nm* [22 - 52] ___________________________________________________________________________________________  ECHOCARDIOGRAPHIC DESCRIPTIONS  AORTIC ROOT Size:Normal Dissection:INDETERM FOR DISSECTION  AORTIC VALVE Leaflets:TricuspidMorphology:Normal Mobility:Fully mobile  LEFT VENTRICLE Size:SMALLAnterior:Normal  Contraction:NormalLateral:Normal Closest EF:50% (Estimated)Septal:Normal  LV Masses:No MassesApical:Normal  PJA:SNKN Inferior:Normal  Posterior:Normal Dias.FxClass:N/A  MITRAL VALVE Leaflets:Normal Mobility:Fully mobile Morphology:Normal  LEFT  ATRIUM Size:NormalLA Masses:No masses  IA Septum:Normal IAS  MAIN PA Size:Normal  PULMONIC VALVE Morphology:Normal Mobility:Fully mobile  RIGHT VENTRICLE  RV Masses:No MassesSize:Normal  Free Wall:NormalContraction:Normal  TRICUSPID VALVE Leaflets:Normal Mobility:Fully mobile Morphology:Normal  RIGHT ATRIUM Size:Normal RA Other:None  RA Mass:No masses  PERICARDIUM  Fluid:No effusion  INFERIOR VENACAVA Size:Normal Normal respiratory collapse  ____________________________________________________________________  DOPPLER ECHO and OTHER SPECIAL PROCEDURES  Aortic:No AR No AS 123.8 cm/sec peak vel 6.1 mmHg peak grad   Mitral:MILD MR No MS 2.7 cm^2 by DOPPLER MV Inflow E Vel=84.2 cm/sec MV Annulus E'Vel=9.9 cm/sec E/E'Ratio=8.5  Tricuspid:MILD TR No TS 207.7 cm/sec peak TR vel22.2 mmHg peak RV pressure  Pulmonary:No PR No PS 93.7 cm/sec peak vel3.5 mmHg peak grad    ___________________________________________________________________________________________ INTERPRETATION NORMAL LEFT VENTRICULAR SYSTOLIC FUNCTION NORMAL RIGHT VENTRICULAR SYSTOLIC FUNCTION MILD VALVULAR REGURGITATION (See above) NO VALVULAR STENOSIS EF 50%   ___________________________________________________________________________________________  Electronically signed by: MD Miquel Dunn on 09/23/2016 04:10 PM Performed By: Maurilio Lovely, RDCS Ordering Physician: Isaias Cowman ___________________________________________________________________________________________  Status Results Details   Unavailable

## 2017-12-25 NOTE — Pre-Procedure Instructions (Signed)
Study Result   CLINICAL DATA:  History of COPD.  Smoker.  EXAM: CHEST - 2 VIEW  COMPARISON:  06/23/2017  FINDINGS: Cardiomediastinal silhouette is normal. Mediastinal contours appear intact.  There is no evidence of focal airspace consolidation, pleural effusion or pneumothorax. Biapical pleural/subpleural scarring. Stable hyperinflation.  Mild spondylosis of the thoracic spine. Soft tissues are grossly normal.  IMPRESSION: Hyperinflation of the lungs consistent with COPD.  Biapical pleural/subpleural scarring.   Electronically Signed   By: Fidela Salisbury M.D.   On: 12/04/2017 15:38

## 2017-12-25 NOTE — Pre-Procedure Instructions (Signed)
PULMONARY CLEARANCE IN EPIC

## 2017-12-26 ENCOUNTER — Ambulatory Visit: Payer: Medicare Other | Admitting: Certified Registered Nurse Anesthetist

## 2017-12-26 ENCOUNTER — Ambulatory Visit
Admission: RE | Admit: 2017-12-26 | Discharge: 2017-12-26 | Disposition: A | Discharge status: Home / Self Care | Payer: Medicare Other | Source: Ambulatory Visit | Attending: General Surgery | Admitting: General Surgery

## 2017-12-26 ENCOUNTER — Other Ambulatory Visit: Payer: Self-pay

## 2017-12-26 ENCOUNTER — Encounter: Payer: Self-pay | Admitting: *Deleted

## 2017-12-26 ENCOUNTER — Encounter
Admission: RE | Disposition: A | Discharge status: Home / Self Care | Payer: Self-pay | Source: Ambulatory Visit | Attending: General Surgery

## 2017-12-26 DIAGNOSIS — E559 Vitamin D deficiency, unspecified: Secondary | ICD-10-CM | POA: Diagnosis not present

## 2017-12-26 DIAGNOSIS — K436 Other and unspecified ventral hernia with obstruction, without gangrene: Secondary | ICD-10-CM | POA: Diagnosis not present

## 2017-12-26 DIAGNOSIS — Z79891 Long term (current) use of opiate analgesic: Secondary | ICD-10-CM | POA: Insufficient documentation

## 2017-12-26 DIAGNOSIS — K439 Ventral hernia without obstruction or gangrene: Secondary | ICD-10-CM | POA: Diagnosis not present

## 2017-12-26 DIAGNOSIS — I10 Essential (primary) hypertension: Secondary | ICD-10-CM | POA: Insufficient documentation

## 2017-12-26 DIAGNOSIS — Z79899 Other long term (current) drug therapy: Secondary | ICD-10-CM | POA: Diagnosis not present

## 2017-12-26 DIAGNOSIS — G8929 Other chronic pain: Secondary | ICD-10-CM | POA: Diagnosis not present

## 2017-12-26 DIAGNOSIS — M549 Dorsalgia, unspecified: Secondary | ICD-10-CM | POA: Insufficient documentation

## 2017-12-26 DIAGNOSIS — K219 Gastro-esophageal reflux disease without esophagitis: Secondary | ICD-10-CM | POA: Diagnosis not present

## 2017-12-26 DIAGNOSIS — E785 Hyperlipidemia, unspecified: Secondary | ICD-10-CM | POA: Diagnosis not present

## 2017-12-26 DIAGNOSIS — Z87891 Personal history of nicotine dependence: Secondary | ICD-10-CM | POA: Insufficient documentation

## 2017-12-26 DIAGNOSIS — J449 Chronic obstructive pulmonary disease, unspecified: Secondary | ICD-10-CM | POA: Diagnosis not present

## 2017-12-26 HISTORY — PX: VENTRAL HERNIA REPAIR: SHX424

## 2017-12-26 SURGERY — REPAIR, HERNIA, VENTRAL
Anesthesia: General | Wound class: Clean

## 2017-12-26 MED ORDER — ONDANSETRON HCL 4 MG/2ML IJ SOLN
INTRAMUSCULAR | Status: DC | PRN
  Administered 2017-12-26: 4 mg via INTRAVENOUS

## 2017-12-26 MED ORDER — MIDAZOLAM HCL 2 MG/2ML IJ SOLN
INTRAMUSCULAR | Status: DC | PRN
  Administered 2017-12-26: 2 mg via INTRAVENOUS

## 2017-12-26 MED ORDER — ONDANSETRON HCL 4 MG/2ML IJ SOLN
INTRAMUSCULAR | Status: AC
  Filled 2017-12-26: qty 2

## 2017-12-26 MED ORDER — FAMOTIDINE 20 MG PO TABS
ORAL_TABLET | ORAL | Status: AC
  Administered 2017-12-26: 20 mg via ORAL
  Filled 2017-12-26: qty 1

## 2017-12-26 MED ORDER — ROCURONIUM BROMIDE 100 MG/10ML IV SOLN
INTRAVENOUS | Status: DC | PRN
  Administered 2017-12-26: 40 mg via INTRAVENOUS

## 2017-12-26 MED ORDER — PHENYLEPHRINE HCL 10 MG/ML IJ SOLN
INTRAMUSCULAR | Status: DC | PRN
  Administered 2017-12-26 (×3): 100 ug via INTRAVENOUS

## 2017-12-26 MED ORDER — ROCURONIUM BROMIDE 50 MG/5ML IV SOLN
INTRAVENOUS | Status: AC
  Filled 2017-12-26: qty 1

## 2017-12-26 MED ORDER — DEXAMETHASONE SODIUM PHOSPHATE 10 MG/ML IJ SOLN
INTRAMUSCULAR | Status: DC | PRN
  Administered 2017-12-26: 10 mg via INTRAVENOUS

## 2017-12-26 MED ORDER — FENTANYL CITRATE (PF) 100 MCG/2ML IJ SOLN
25.0000 ug | INTRAMUSCULAR | Status: DC | PRN
  Administered 2017-12-26 (×3): 25 ug via INTRAVENOUS

## 2017-12-26 MED ORDER — FENTANYL CITRATE (PF) 100 MCG/2ML IJ SOLN
INTRAMUSCULAR | Status: AC
  Filled 2017-12-26: qty 2

## 2017-12-26 MED ORDER — ACETAMINOPHEN 10 MG/ML IV SOLN
INTRAVENOUS | Status: DC | PRN
  Administered 2017-12-26: 1000 mg via INTRAVENOUS

## 2017-12-26 MED ORDER — OXYCODONE HCL 5 MG PO TABS
5.0000 mg | ORAL_TABLET | Freq: Four times a day (QID) | ORAL | Status: DC | PRN
  Administered 2017-12-26: 5 mg via ORAL

## 2017-12-26 MED ORDER — FENTANYL CITRATE (PF) 100 MCG/2ML IJ SOLN
INTRAMUSCULAR | Status: DC | PRN
  Administered 2017-12-26 (×4): 50 ug via INTRAVENOUS

## 2017-12-26 MED ORDER — OXYCODONE HCL 5 MG PO TABS: 5.0000 mg | ORAL_TABLET | Freq: Four times a day (QID) | ORAL | 0 refills | Status: DC | PRN

## 2017-12-26 MED ORDER — ONDANSETRON HCL 4 MG/2ML IJ SOLN: 4.0000 mg | Freq: Once | INTRAMUSCULAR | Status: DC | PRN

## 2017-12-26 MED ORDER — IPRATROPIUM-ALBUTEROL 0.5-2.5 (3) MG/3ML IN SOLN
3.0000 mL | Freq: Once | RESPIRATORY_TRACT | Status: AC
  Administered 2017-12-26: 3 mL via RESPIRATORY_TRACT

## 2017-12-26 MED ORDER — IPRATROPIUM-ALBUTEROL 0.5-2.5 (3) MG/3ML IN SOLN
RESPIRATORY_TRACT | Status: AC
  Administered 2017-12-26: 3 mL via RESPIRATORY_TRACT
  Filled 2017-12-26: qty 3

## 2017-12-26 MED ORDER — CEFAZOLIN SODIUM-DEXTROSE 2-4 GM/100ML-% IV SOLN
INTRAVENOUS | Status: AC
  Filled 2017-12-26: qty 100

## 2017-12-26 MED ORDER — ALBUTEROL SULFATE HFA 108 (90 BASE) MCG/ACT IN AERS
INHALATION_SPRAY | RESPIRATORY_TRACT | Status: DC | PRN
  Administered 2017-12-26: 6 via RESPIRATORY_TRACT

## 2017-12-26 MED ORDER — LIDOCAINE HCL (CARDIAC) PF 100 MG/5ML IV SOSY
PREFILLED_SYRINGE | INTRAVENOUS | Status: DC | PRN
  Administered 2017-12-26: 80 mg via INTRAVENOUS

## 2017-12-26 MED ORDER — SUGAMMADEX SODIUM 200 MG/2ML IV SOLN
INTRAVENOUS | Status: DC | PRN
  Administered 2017-12-26: 200 mg via INTRAVENOUS

## 2017-12-26 MED ORDER — FAMOTIDINE 20 MG PO TABS
20.0000 mg | ORAL_TABLET | Freq: Once | ORAL | Status: AC
  Administered 2017-12-26: 20 mg via ORAL

## 2017-12-26 MED ORDER — SUGAMMADEX SODIUM 200 MG/2ML IV SOLN
INTRAVENOUS | Status: AC
  Filled 2017-12-26: qty 2

## 2017-12-26 MED ORDER — MIDAZOLAM HCL 2 MG/2ML IJ SOLN
INTRAMUSCULAR | Status: AC
  Filled 2017-12-26: qty 2

## 2017-12-26 MED ORDER — BUPIVACAINE-EPINEPHRINE (PF) 0.5% -1:200000 IJ SOLN
INTRAMUSCULAR | Status: DC | PRN
  Administered 2017-12-26: 20 mL via PERINEURAL

## 2017-12-26 MED ORDER — DEXAMETHASONE SODIUM PHOSPHATE 10 MG/ML IJ SOLN
INTRAMUSCULAR | Status: AC
  Filled 2017-12-26: qty 1

## 2017-12-26 MED ORDER — BUPIVACAINE-EPINEPHRINE (PF) 0.5% -1:200000 IJ SOLN
INTRAMUSCULAR | Status: AC
  Filled 2017-12-26: qty 30

## 2017-12-26 MED ORDER — ALBUTEROL SULFATE HFA 108 (90 BASE) MCG/ACT IN AERS
INHALATION_SPRAY | RESPIRATORY_TRACT | Status: AC
  Filled 2017-12-26: qty 6.7

## 2017-12-26 MED ORDER — LACTATED RINGERS IV SOLN
INTRAVENOUS | Status: DC
  Administered 2017-12-26 (×2): via INTRAVENOUS

## 2017-12-26 MED ORDER — ACETAMINOPHEN 10 MG/ML IV SOLN
INTRAVENOUS | Status: AC
  Filled 2017-12-26: qty 100

## 2017-12-26 MED ORDER — FENTANYL CITRATE (PF) 100 MCG/2ML IJ SOLN
INTRAMUSCULAR | Status: AC
  Administered 2017-12-26: 25 ug via INTRAVENOUS
  Filled 2017-12-26: qty 2

## 2017-12-26 MED ORDER — OXYCODONE HCL 5 MG PO TABS
ORAL_TABLET | ORAL | Status: AC
  Filled 2017-12-26: qty 1

## 2017-12-26 MED ORDER — PROPOFOL 10 MG/ML IV BOLUS
INTRAVENOUS | Status: DC | PRN
  Administered 2017-12-26: 130 mg via INTRAVENOUS

## 2017-12-26 SURGICAL SUPPLY — 34 items
BLADE SURG 15 STRL SS SAFETY (BLADE) ×3 IMPLANT
CANISTER SUCT 1200ML W/VALVE (MISCELLANEOUS) ×3 IMPLANT
CHLORAPREP W/TINT 26ML (MISCELLANEOUS) ×3 IMPLANT
CLOSURE WOUND 1/2 X4 (GAUZE/BANDAGES/DRESSINGS) ×1
DRAIN CHANNEL JP 15F RND 16 (MISCELLANEOUS) IMPLANT
DRAPE CHEST BREAST 77X106 FENE (MISCELLANEOUS) IMPLANT
DRAPE LAPAROTOMY 100X77 ABD (DRAPES) ×3 IMPLANT
DRSG TEGADERM 4X4.75 (GAUZE/BANDAGES/DRESSINGS) IMPLANT
DRSG TELFA 3X8 NADH (GAUZE/BANDAGES/DRESSINGS) ×3 IMPLANT
ELECT REM PT RETURN 9FT ADLT (ELECTROSURGICAL) ×3
ELECTRODE REM PT RTRN 9FT ADLT (ELECTROSURGICAL) ×1 IMPLANT
GAUZE SPONGE 4X4 12PLY STRL (GAUZE/BANDAGES/DRESSINGS) IMPLANT
GLOVE BIO SURGEON STRL SZ7.5 (GLOVE) ×6 IMPLANT
GLOVE INDICATOR 8.0 STRL GRN (GLOVE) ×9 IMPLANT
GOWN STRL REUS W/ TWL LRG LVL3 (GOWN DISPOSABLE) ×3 IMPLANT
GOWN STRL REUS W/TWL LRG LVL3 (GOWN DISPOSABLE) ×6
KIT TURNOVER KIT A (KITS) ×3 IMPLANT
LABEL OR SOLS (LABEL) ×3 IMPLANT
NEEDLE HYPO 22GX1.5 SAFETY (NEEDLE) ×3 IMPLANT
NEEDLE HYPO 25X1 1.5 SAFETY (NEEDLE) IMPLANT
NS IRRIG 500ML POUR BTL (IV SOLUTION) ×3 IMPLANT
PACK BASIN MINOR ARMC (MISCELLANEOUS) ×3 IMPLANT
SPONGE LAP 18X18 RF (DISPOSABLE) ×3 IMPLANT
STAPLER SKIN PROX 35W (STAPLE) ×3 IMPLANT
STRIP CLOSURE SKIN 1/2X4 (GAUZE/BANDAGES/DRESSINGS) ×2 IMPLANT
SUT SURGILON 0 BLK (SUTURE) ×3 IMPLANT
SUT VIC AB 2-0 BRD 54 (SUTURE) ×6 IMPLANT
SUT VIC AB 3-0 SH 27 (SUTURE) ×2
SUT VIC AB 3-0 SH 27X BRD (SUTURE) ×1 IMPLANT
SUT VIC AB 4-0 FS2 27 (SUTURE) ×3 IMPLANT
SUT VICRYL+ 3-0 144IN (SUTURE) ×3 IMPLANT
SYR 10ML LL (SYRINGE) ×3 IMPLANT
SYR 3ML LL SCALE MARK (SYRINGE) ×3 IMPLANT
TRAY FOLEY MTR SLVR 16FR STAT (SET/KITS/TRAYS/PACK) ×3 IMPLANT

## 2017-12-26 NOTE — Op Note (Signed)
Preoperative diagnosis: Ventral hernia with incarcerated fat.  Postoperative diagnosis: Same.  Operative procedure repair of ventral hernia, primary.  Operating Surgeon: Hervey Ard, MD.  Assistant: Arvilla Meres, RNFA.  Anesthesia: General endotracheal, Marcaine 0.5% with 1-200,000 notes of epinephrine, 20 cc  Estimated blood loss: Less than 2 cc.  Clinical note: This 57 year old woman has developed a slowly enlarging mass in the lower midline along the site of 1 of her previous laparotomy incisions.  He has become increasingly uncomfortable.  She was admitted for elective repair.  She received preoperative antibiotics in the event mesh repair was required.  SCD stockings for DVT prevention.  Operative note: The patient underwent general anesthesia without difficulty.  The abdomen was cleansed with ChloraPrep and draped.  Marcaine was infiltrated for postoperative analgesia.  The skin was incised longitudinally.  Subtenons tissue was divided and the protruding fat was identified.  This was dissected down to the fascial level where it was freed circumferentially, hemostasis achieved with 3-0 Vicryl ties and the redundant fat amputated.  The undersurface of the fascia was cleared.  A single wire suture previously placed was removed.  The wound measured approximately 7 mm in diameter.  This was closed transversely with interrupted 0 Surgilon sutures placed individually and then tied sequentially.  The adipose layer was approximated with a 2-0 Vicryl figure-of-eight suture x2.  The superficial adipose tissue was closed with a similar suture and the skin closed with a running 4-0 Vicryl subcuticular suture.  Benzoin and Steri-Strips followed by a honeycomb direction dressing was applied.  Patient tolerated the procedure well was taken to recovery room in stable condition.

## 2017-12-26 NOTE — Anesthesia Procedure Notes (Addendum)
Procedure Name: Intubation Date/Time: 12/26/2017 3:35 PM Performed by: Sherrine Maples, CRNA Pre-anesthesia Checklist: Patient identified, Patient being monitored, Timeout performed, Emergency Drugs available and Suction available Patient Re-evaluated:Patient Re-evaluated prior to induction Oxygen Delivery Method: Circle system utilized Preoxygenation: Pre-oxygenation with 100% oxygen Induction Type: IV induction Ventilation: Mask ventilation without difficulty Laryngoscope Size: Miller and 2 Grade View: Grade I Tube type: Oral Tube size: 7.0 mm Number of attempts: 1 Airway Equipment and Method: Stylet Placement Confirmation: ETT inserted through vocal cords under direct vision,  positive ETCO2 and breath sounds checked- equal and bilateral Secured at: 21 cm Tube secured with: Tape Dental Injury: Teeth and Oropharynx as per pre-operative assessment

## 2017-12-26 NOTE — H&P (Signed)
No change in clinical condition or exam.  For ventral hernia repair.  

## 2017-12-26 NOTE — Transfer of Care (Signed)
Immediate Anesthesia Transfer of Care Note  Patient: Taje Tondreau  Procedure(s) Performed: HERNIA REPAIR VENTRAL ADULT (N/A )  Patient Location: PACU  Anesthesia Type:General  Level of Consciousness: sedated  Airway & Oxygen Therapy: Patient Spontanous Breathing and Patient connected to nasal cannula oxygen  Post-op Assessment: Report given to RN and Post -op Vital signs reviewed and stable  Post vital signs: Reviewed and stable  Last Vitals:  Vitals Value Taken Time  BP    Temp    Pulse    Resp    SpO2      Last Pain:  Vitals:   12/26/17 1330  TempSrc: Oral  PainSc: 3          Complications: No apparent anesthesia complications

## 2017-12-26 NOTE — Anesthesia Post-op Follow-up Note (Signed)
Anesthesia QCDR form completed.        

## 2017-12-26 NOTE — Discharge Instructions (Signed)
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
## 2017-12-27 ENCOUNTER — Encounter: Payer: Self-pay | Admitting: General Surgery

## 2017-12-28 NOTE — Anesthesia Postprocedure Evaluation (Signed)
Anesthesia Post Note  Patient: Paula Duncan  Procedure(s) Performed: HERNIA REPAIR VENTRAL ADULT (N/A )  Patient location during evaluation: PACU Anesthesia Type: General Level of consciousness: awake and alert Pain management: pain level controlled Vital Signs Assessment: post-procedure vital signs reviewed and stable Respiratory status: spontaneous breathing, nonlabored ventilation, respiratory function stable and patient connected to nasal cannula oxygen Cardiovascular status: blood pressure returned to baseline and stable Postop Assessment: no apparent nausea or vomiting Anesthetic complications: no     Last Vitals:  Vitals:   12/26/17 1800 12/26/17 1836  BP: (!) 114/51 129/61  Pulse: (!) 103 (!) 102  Resp: 14 16  Temp: (!) 36.3 C   SpO2:  92%    Last Pain:  Vitals:   12/26/17 1851  TempSrc:   PainSc: [place]

## 2017-12-29 ENCOUNTER — Telehealth: Payer: Self-pay | Admitting: *Deleted

## 2017-12-29 NOTE — Telephone Encounter (Signed)
Early next week

## 2017-12-29 NOTE — Telephone Encounter (Signed)
Patient called and had surgery on Friday 12/26/17 and was unsure when she needs to come back in for an office visit. When would you like to see her back in the office?

## 2018-01-01 ENCOUNTER — Telehealth: Payer: Self-pay | Admitting: Pulmonary Disease

## 2018-01-01 ENCOUNTER — Other Ambulatory Visit: Payer: Self-pay | Admitting: Pulmonary Disease

## 2018-01-01 MED ORDER — TIOTROPIUM BROMIDE MONOHYDRATE 2.5 MCG/ACT IN AERS: 2.0000 | INHALATION_SPRAY | Freq: Every day | RESPIRATORY_TRACT | 6 refills | Status: DC

## 2018-01-01 NOTE — Telephone Encounter (Signed)
Called to speak with patient to verify that she is taking Spiriva daily. Left message on machine will try to call back.

## 2018-01-06 ENCOUNTER — Ambulatory Visit (INDEPENDENT_AMBULATORY_CARE_PROVIDER_SITE_OTHER): Payer: Medicare Other | Admitting: General Surgery

## 2018-01-06 ENCOUNTER — Encounter: Payer: Self-pay | Admitting: General Surgery

## 2018-01-06 VITALS — BP 106/56 | HR 116 | Resp 20 | Ht 64.0 in | Wt 122.0 lb

## 2018-01-06 DIAGNOSIS — K439 Ventral hernia without obstruction or gangrene: Secondary | ICD-10-CM

## 2018-01-06 NOTE — Patient Instructions (Addendum)
Return in one month. The patient is aware to call back for any questions or concerns.  
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
## 2018-01-08 ENCOUNTER — Telehealth: Payer: Self-pay | Admitting: *Deleted

## 2018-01-08 NOTE — Telephone Encounter (Signed)
Patient wanted to know if she could take a bath. advised patient she could take a bath.

## 2018-01-15 NOTE — Telephone Encounter (Signed)
Spoke and verified this medication.

## 2018-02-05 ENCOUNTER — Ambulatory Visit (INDEPENDENT_AMBULATORY_CARE_PROVIDER_SITE_OTHER): Payer: Medicare Other | Admitting: General Surgery

## 2018-02-05 ENCOUNTER — Encounter: Payer: Self-pay | Admitting: General Surgery

## 2018-02-05 VITALS — BP 140/78 | HR 98 | Resp 15 | Ht 64.0 in | Wt 125.0 lb

## 2018-02-05 DIAGNOSIS — K439 Ventral hernia without obstruction or gangrene: Secondary | ICD-10-CM
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
## 2018-02-05 NOTE — Patient Instructions (Addendum)
Follow up as needed Use good judgement with lifting. Squat and keep things close to your body.   No very heavy lifting for a couple more weeks

## 2018-02-10 ENCOUNTER — Other Ambulatory Visit: Payer: Self-pay | Admitting: Pulmonary Disease

## 2018-02-10 ENCOUNTER — Telehealth: Payer: Self-pay | Admitting: Pulmonary Disease

## 2018-02-10 ENCOUNTER — Other Ambulatory Visit: Payer: Self-pay

## 2018-02-10 ENCOUNTER — Ambulatory Visit: Payer: Medicare Other | Attending: Nurse Practitioner | Admitting: Nurse Practitioner

## 2018-02-10 ENCOUNTER — Encounter: Payer: Self-pay | Admitting: Nurse Practitioner

## 2018-02-10 VITALS — BP 139/75 | HR 108 | Temp 98.1°F | Resp 18 | Ht 64.0 in | Wt 125.0 lb

## 2018-02-10 DIAGNOSIS — M810 Age-related osteoporosis without current pathological fracture: Secondary | ICD-10-CM | POA: Insufficient documentation

## 2018-02-10 DIAGNOSIS — E785 Hyperlipidemia, unspecified: Secondary | ICD-10-CM | POA: Diagnosis not present

## 2018-02-10 DIAGNOSIS — M47816 Spondylosis without myelopathy or radiculopathy, lumbar region: Secondary | ICD-10-CM

## 2018-02-10 DIAGNOSIS — Z886 Allergy status to analgesic agent status: Secondary | ICD-10-CM | POA: Insufficient documentation

## 2018-02-10 DIAGNOSIS — Z79891 Long term (current) use of opiate analgesic: Secondary | ICD-10-CM | POA: Insufficient documentation

## 2018-02-10 DIAGNOSIS — G4701 Insomnia due to medical condition: Secondary | ICD-10-CM

## 2018-02-10 DIAGNOSIS — M792 Neuralgia and neuritis, unspecified: Secondary | ICD-10-CM | POA: Diagnosis not present

## 2018-02-10 DIAGNOSIS — R252 Cramp and spasm: Secondary | ICD-10-CM | POA: Insufficient documentation

## 2018-02-10 DIAGNOSIS — G629 Polyneuropathy, unspecified: Secondary | ICD-10-CM | POA: Diagnosis not present

## 2018-02-10 DIAGNOSIS — E559 Vitamin D deficiency, unspecified: Secondary | ICD-10-CM | POA: Diagnosis not present

## 2018-02-10 DIAGNOSIS — M7918 Myalgia, other site: Secondary | ICD-10-CM | POA: Diagnosis not present

## 2018-02-10 DIAGNOSIS — G894 Chronic pain syndrome: Secondary | ICD-10-CM | POA: Insufficient documentation

## 2018-02-10 DIAGNOSIS — I10 Essential (primary) hypertension: Secondary | ICD-10-CM | POA: Insufficient documentation

## 2018-02-10 DIAGNOSIS — K219 Gastro-esophageal reflux disease without esophagitis: Secondary | ICD-10-CM | POA: Diagnosis not present

## 2018-02-10 DIAGNOSIS — I493 Ventricular premature depolarization: Secondary | ICD-10-CM | POA: Diagnosis not present

## 2018-02-10 DIAGNOSIS — Z5181 Encounter for therapeutic drug level monitoring: Secondary | ICD-10-CM | POA: Diagnosis not present

## 2018-02-10 DIAGNOSIS — J449 Chronic obstructive pulmonary disease, unspecified: Secondary | ICD-10-CM | POA: Insufficient documentation

## 2018-02-10 DIAGNOSIS — Z87891 Personal history of nicotine dependence: Secondary | ICD-10-CM | POA: Insufficient documentation

## 2018-02-10 DIAGNOSIS — M79605 Pain in left leg: Secondary | ICD-10-CM

## 2018-02-10 DIAGNOSIS — Z79899 Other long term (current) drug therapy: Secondary | ICD-10-CM | POA: Insufficient documentation

## 2018-02-10 DIAGNOSIS — G4700 Insomnia, unspecified: Secondary | ICD-10-CM | POA: Diagnosis not present

## 2018-02-10 DIAGNOSIS — M47896 Other spondylosis, lumbar region: Secondary | ICD-10-CM | POA: Diagnosis not present

## 2018-02-10 DIAGNOSIS — G8929 Other chronic pain: Secondary | ICD-10-CM

## 2018-02-10 DIAGNOSIS — R209 Unspecified disturbances of skin sensation: Secondary | ICD-10-CM | POA: Diagnosis not present

## 2018-02-10 MED ORDER — BENZONATATE 200 MG PO CAPS: 200.0000 mg | ORAL_CAPSULE | Freq: Three times a day (TID) | ORAL | 1 refills | Status: DC | PRN

## 2018-02-10 MED ORDER — CYCLOBENZAPRINE HCL 10 MG PO TABS: 10.0000 mg | ORAL_TABLET | Freq: Three times a day (TID) | ORAL | 2 refills | Status: DC | PRN

## 2018-02-10 MED ORDER — OXYCODONE HCL 5 MG PO TABS: 5.0000 mg | ORAL_TABLET | Freq: Three times a day (TID) | ORAL | 0 refills | Status: DC | PRN

## 2018-02-10 MED ORDER — GABAPENTIN 300 MG PO CAPS: 300.0000 mg | ORAL_CAPSULE | Freq: Three times a day (TID) | ORAL | 2 refills | Status: DC

## 2018-02-10 MED ORDER — TRAZODONE HCL 100 MG PO TABS: 100.0000 mg | ORAL_TABLET | Freq: Every day | ORAL | 0 refills | Status: DC

## 2018-02-10 NOTE — Progress Notes (Signed)
Nursing Pain Medication Assessment:  Safety precautions to be maintained throughout the outpatient stay will include: orient to surroundings, keep bed in low position, maintain call bell within reach at all times, provide assistance with transfer out of bed and ambulation.  Medication Inspection Compliance: Pill count conducted under aseptic conditions, in front of the patient. Neither the pills nor the bottle was removed from the patient's sight at any time. Once count was completed pills were immediately returned to the patient in their original bottle.  Medication: Oxycodone IR Pill/Patch Count: 15 of 90 pills remain Pill/Patch Appearance: Markings consistent with prescribed medication Bottle Appearance: Standard pharmacy container. Clearly labeled. Filled Date: 14 / 14 / 2019 Last Medication intake:  Today

## 2018-02-10 NOTE — Telephone Encounter (Signed)
Called and spoke with patient, she stated that she is still having a cough. Dry cough with no production. No other symptoms present.   Patient is requesting codiene.   OA please advise, thank you.

## 2018-02-10 NOTE — Telephone Encounter (Signed)
Put in a prescription for benzonatate  She is already on Delsym which is over-the-counter-a cough suppressant

## 2018-02-10 NOTE — Progress Notes (Signed)
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 a different pharmacy, please inform the nursing staff. The pharmacy listed in the electronic medical record should be the one where you would like electronic prescriptions to be sent.  Prescription refills: Only during scheduled appointments. Applies to both, written and electronic prescriptions.  NOTE: The following applies primarily to controlled substances (Opioid* Pain Medications).   Patient's responsibilities: 1. Pain Pills: Bring all pain pills to every appointment (except for procedure appointments). 2. Pill Bottles: Bring pills in original pharmacy bottle. Always bring newest bottle. Bring bottle, even if empty. 3. Medication refills: You are responsible for knowing and keeping track of what medications you need refilled. The day before your appointment, write a list of all  prescriptions that need to be refilled. Bring that list to your appointment and give it to the admitting nurse. Prescriptions will be written only during appointments. If you forget a medication, it will not be "Called in", "Faxed", or "electronically sent". You will need to get another appointment to get these prescribed. 4. Prescription Accuracy: You are responsible for carefully inspecting your prescriptions before leaving our office. Have the discharge nurse carefully go over each prescription with you, before taking them home. Make sure that your name is accurately spelled, that your address is correct. Check the name and dose of your medication to make sure it is accurate. Check the number of pills, and the written instructions to make sure they are clear and accurate. Make sure that you are given enough medication to last until your next medication refill appointment. 5. Taking Medication: Take medication as prescribed. Never take more pills than instructed. Never take medication more frequently than prescribed. Taking less pills or less frequently is permitted and encouraged, when it comes to controlled substances (written prescriptions).  6. Inform other Doctors: Always inform, all of your healthcare providers, of all the medications you take. 7. Pain Medication from other Providers: You are not allowed to accept any additional pain medication from any other Doctor or Healthcare provider. There are two exceptions to this rule. (see below) In  the event that you require additional pain medication, you are responsible for notifying us, as stated below. 8. Medication Agreement: You are responsible for carefully reading and following our Medication Agreement. This must be signed before receiving any prescriptions from our practice. Safely store a copy of your signed Agreement. Violations to the Agreement will result in no further prescriptions. (Additional copies of our Medication Agreement are available  upon request.) 9. Laws, Rules, & Regulations: All patients are expected to follow all Federal and [employer], [employer], Rules, [employer]. Ignorance of the Laws does not constitute a valid excuse. The use of any illegal substances is prohibited. 10. Adopted CDC guidelines & recommendations: Target dosing levels will be at or below 60 MME/day. Use of benzodiazepines** is not recommended.  Exceptions: There are only two exceptions to the rule of not receiving pain medications from other Healthcare Providers. 1. Exception #1 (Emergencies): In the event of an emergency (i.e.: accident requiring emergency care), you are allowed to receive additional pain medication. However, you are responsible for: As soon as you are able, call our office [phone], at any time of the day or night, and leave a message stating your name, the date and nature of the emergency, and the name and dose of the medication prescribed. In the event that your call is answered by a member of our staff, make sure to document and save the date, time, and the name of the person that took your information.  2. Exception #2 (Planned Surgery): In the event that you are scheduled by another doctor or dentist to have any type of surgery or procedure, you are allowed (for a period no longer than 30 days), to receive additional pain medication, for the acute post-op pain. However, in this case, you are responsible for picking up a copy of our "Post-op Pain Management for Surgeons" handout, and giving it to your surgeon or dentist. This document is available at our office, and does not require an appointment to obtain it. Simply go to our office during business hours (Monday-Thursday from 8:00 AM to 4:00 PM) (Friday 8:00 AM to 12:00 Noon) or if you have a scheduled appointment with [place], prior to your surgery, and ask for it by name. In addition, you will need to provide [place] with your name, name of your surgeon, type of surgery, and date of  procedure or surgery.  *Opioid medications include: morphine, codeine, oxycodone, oxymorphone, hydrocodone, hydromorphone, meperidine, tramadol, tapentadol, buprenorphine, fentanyl, methadone. **Benzodiazepine medications include: diazepam (Valium), alprazolam (Xanax), clonazepam (Klonopine), lorazepam (Ativan), clorazepate (Tranxene), chlordiazepoxide (Librium), estazolam (Prosom), oxazepam (Serax), temazepam (Restoril), triazolam (Halcion) (Last updated: 07/03/2017) ____________________________________________________________________________________________

## 2018-02-10 NOTE — Patient Instructions (Signed)
____________________________________________________________________________________________  Medication Rules  Applies to: All patients receiving prescriptions (written or electronic).  Pharmacy of record: Pharmacy where electronic prescriptions will be sent. If written prescriptions are taken to a different pharmacy, please inform the nursing staff. The pharmacy listed in the electronic medical record should be the one where you would like electronic prescriptions to be sent.  Prescription refills: Only during scheduled appointments. Applies to both, written and electronic prescriptions.  NOTE: The following applies primarily to controlled substances (Opioid* Pain Medications).   Patient's responsibilities: 1. Pain Pills: Bring all pain pills to every appointment (except for procedure appointments). 2. Pill Bottles: Bring pills in original pharmacy bottle. Always bring newest bottle. Bring bottle, even if empty. 3. Medication refills: You are responsible for knowing and keeping track of what medications you need refilled. The day before your appointment, write a list of all prescriptions that need to be refilled. Bring that list to your appointment and give it to the admitting nurse. Prescriptions will be written only during appointments. If you forget a medication, it will not be "Called in", "Faxed", or "electronically sent". You will need to get another appointment to get these prescribed. 4. Prescription Accuracy: You are responsible for carefully inspecting your prescriptions before leaving our office. Have the discharge nurse carefully go over each prescription with you, before taking them home. Make sure that your name is accurately spelled, that your address is correct. Check the name and dose of your medication to make sure it is accurate. Check the number of pills, and the written instructions to make sure they are clear and accurate. Make sure that you are given enough medication to last  until your next medication refill appointment. 5. Taking Medication: Take medication as prescribed. Never take more pills than instructed. Never take medication more frequently than prescribed. Taking less pills or less frequently is permitted and encouraged, when it comes to controlled substances (written prescriptions).  6. Inform other Doctors: Always inform, all of your healthcare providers, of all the medications you take. 7. Pain Medication from other Providers: You are not allowed to accept any additional pain medication from any other Doctor or Healthcare provider. There are two exceptions to this rule. (see below) In the event that you require additional pain medication, you are responsible for notifying us, as stated below. 8. Medication Agreement: You are responsible for carefully reading and following our Medication Agreement. This must be signed before receiving any prescriptions from our practice. Safely store a copy of your signed Agreement. Violations to the Agreement will result in no further prescriptions. (Additional copies of our Medication Agreement are available upon request.) 9. Laws, Rules, & Regulations: All patients are expected to follow all Federal and State Laws, Statutes, Rules, & Regulations. Ignorance of the Laws does not constitute a valid excuse. The use of any illegal substances is prohibited. 10. Adopted CDC guidelines & recommendations: Target dosing levels will be at or below 60 MME/day. Use of benzodiazepines** is not recommended.  Exceptions: There are only two exceptions to the rule of not receiving pain medications from other Healthcare Providers. 1. Exception #1 (Emergencies): In the event of an emergency (i.e.: accident requiring emergency care), you are allowed to receive additional pain medication. However, you are responsible for: As soon as you are able, call our office [phone], at any time of the day or night, and leave a message stating your name, the  date and nature of the emergency, and the name and dose of the medication   prescribed. In the event that your call is answered by a member of our staff, make sure to document and save the date, time, and the name of the person that took your information.  2. Exception #2 (Planned Surgery): In the event that you are scheduled by another doctor or dentist to have any type of surgery or procedure, you are allowed (for a period no longer than 30 days), to receive additional pain medication, for the acute post-op pain. However, in this case, you are responsible for picking up a copy of our "Post-op Pain Management for Surgeons" handout, and giving it to your surgeon or dentist. This document is available at our office, and does not require an appointment to obtain it. Simply go to our office during business hours (Monday-Thursday from 8:00 AM to 4:00 PM) (Friday 8:00 AM to 12:00 Noon) or if you have a scheduled appointment with us, prior to your surgery, and ask for it by name. In addition, you will need to provide us with your name, name of your surgeon, type of surgery, and date of procedure or surgery.  *Opioid medications include: morphine, codeine, oxycodone, oxymorphone, hydrocodone, hydromorphone, meperidine, tramadol, tapentadol, buprenorphine, fentanyl, methadone. **Benzodiazepine medications include: diazepam (Valium), alprazolam (Xanax), clonazepam (Klonopine), lorazepam (Ativan), clorazepate (Tranxene), chlordiazepoxide (Librium), estazolam (Prosom), oxazepam (Serax), temazepam (Restoril), triazolam (Halcion) (Last updated: 07/03/2017) ____________________________________________________________________________________________    

## 2018-02-10 NOTE — Telephone Encounter (Signed)
Called patient unable to reach left message to give us a call back.

## 2018-02-10 NOTE — Telephone Encounter (Signed)
Called and spoke with patient, she already has enough benzonatate and does not want anymore. Patient stated she would call her pcp. Nothing further needed.

## 2018-02-26 ENCOUNTER — Ambulatory Visit (INDEPENDENT_AMBULATORY_CARE_PROVIDER_SITE_OTHER): Payer: Medicare Other | Admitting: Pulmonary Disease

## 2018-02-26 VITALS — BP 118/78 | HR 113 | Ht 64.0 in | Wt 130.0 lb

## 2018-02-26 DIAGNOSIS — J449 Chronic obstructive pulmonary disease, unspecified: Secondary | ICD-10-CM

## 2018-02-26 DIAGNOSIS — R05 Cough: Secondary | ICD-10-CM | POA: Diagnosis not present

## 2018-02-26 DIAGNOSIS — J439 Emphysema, unspecified: Secondary | ICD-10-CM

## 2018-02-26 DIAGNOSIS — R059 Cough, unspecified: Secondary | ICD-10-CM

## 2018-02-26 LAB — PULMONARY FUNCTION TEST
DL/VA % pred: 53 %
DL/VA: 2.55 ml/min/mmHg/L
DLCO UNC: 10.3 ml/min/mmHg
DLCO unc % pred: 42 %
FEF 25-75 POST: 1.84 L/s
FEF 25-75 Pre: 1.42 L/sec
FEF2575-%Change-Post: 30 %
FEF2575-%Pred-Post: 77 %
FEF2575-%Pred-Pre: 59 %
FEV1-%Change-Post: 7 %
FEV1-%PRED-POST: 73 %
FEV1-%PRED-PRE: 68 %
FEV1-PRE: 1.77 L
FEV1-Post: 1.89 L
FEV1FVC-%CHANGE-POST: 7 %
FEV1FVC-%Pred-Pre: 97 %
FEV6-%Change-Post: 0 %
FEV6-%Pred-Post: 72 %
FEV6-%Pred-Pre: 71 %
FEV6-POST: 2.33 L
FEV6-PRE: 2.31 L
FEV6FVC-%PRED-POST: 103 %
FEV6FVC-%PRED-PRE: 103 %
FVC-%Change-Post: 0 %
FVC-%PRED-PRE: 69 %
FVC-%Pred-Post: 69 %
FVC-POST: 2.33 L
FVC-PRE: 2.33 L
POST FEV6/FVC RATIO: 100 %
PRE FEV1/FVC RATIO: 76 %
Post FEV1/FVC ratio: 81 %
Pre FEV6/FVC Ratio: 100 %
RV % PRED: 111 %
RV: 2.19 L
TLC % PRED: 90 %
TLC: 4.59 L

## 2018-02-26 MED ORDER — GUAIFENESIN-CODEINE 100-10 MG/5ML PO SYRP: 5.0000 mL | ORAL_SOLUTION | Freq: Three times a day (TID) | ORAL | 0 refills | Status: DC | PRN

## 2018-02-26 NOTE — Progress Notes (Signed)
PFT done today. 
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
## 2018-02-26 NOTE — Patient Instructions (Signed)
COPD Chronic cough  We will see her back in the office in about 6 months Call with any significant concerns

## 2018-03-04 ENCOUNTER — Ambulatory Visit: Payer: Medicare Other | Admitting: Pulmonary Disease

## 2018-03-05 DIAGNOSIS — R002 Palpitations: Secondary | ICD-10-CM | POA: Insufficient documentation

## 2018-03-05 DIAGNOSIS — Z87891 Personal history of nicotine dependence: Secondary | ICD-10-CM | POA: Diagnosis not present

## 2018-03-05 DIAGNOSIS — R000 Tachycardia, unspecified: Secondary | ICD-10-CM | POA: Diagnosis not present

## 2018-03-05 DIAGNOSIS — I479 Paroxysmal tachycardia, unspecified: Secondary | ICD-10-CM | POA: Diagnosis not present

## 2018-03-05 DIAGNOSIS — I493 Ventricular premature depolarization: Secondary | ICD-10-CM | POA: Diagnosis not present

## 2018-03-05 DIAGNOSIS — E7800 Pure hypercholesterolemia, unspecified: Secondary | ICD-10-CM | POA: Diagnosis not present

## 2018-03-19 ENCOUNTER — Other Ambulatory Visit: Payer: Self-pay

## 2018-03-19 ENCOUNTER — Encounter: Payer: Self-pay | Admitting: Pain Medicine

## 2018-03-19 ENCOUNTER — Ambulatory Visit (HOSPITAL_BASED_OUTPATIENT_CLINIC_OR_DEPARTMENT_OTHER): Payer: Medicare Other | Admitting: Pain Medicine

## 2018-03-19 ENCOUNTER — Ambulatory Visit
Admission: RE | Admit: 2018-03-19 | Discharge: 2018-03-19 | Disposition: A | Discharge status: Home / Self Care | Payer: Medicare Other | Source: Ambulatory Visit | Attending: Pain Medicine | Admitting: Pain Medicine

## 2018-03-19 VITALS — BP 110/60 | HR 102 | Temp 98.1°F | Resp 16 | Ht 64.0 in | Wt 128.0 lb

## 2018-03-19 DIAGNOSIS — K432 Incisional hernia without obstruction or gangrene: Secondary | ICD-10-CM | POA: Diagnosis not present

## 2018-03-19 DIAGNOSIS — M47897 Other spondylosis, lumbosacral region: Secondary | ICD-10-CM | POA: Insufficient documentation

## 2018-03-19 DIAGNOSIS — M5137 Other intervertebral disc degeneration, lumbosacral region: Secondary | ICD-10-CM | POA: Diagnosis not present

## 2018-03-19 DIAGNOSIS — Z79891 Long term (current) use of opiate analgesic: Secondary | ICD-10-CM | POA: Insufficient documentation

## 2018-03-19 DIAGNOSIS — M961 Postlaminectomy syndrome, not elsewhere classified: Secondary | ICD-10-CM | POA: Insufficient documentation

## 2018-03-19 DIAGNOSIS — M47817 Spondylosis without myelopathy or radiculopathy, lumbosacral region: Secondary | ICD-10-CM | POA: Insufficient documentation

## 2018-03-19 DIAGNOSIS — Z79899 Other long term (current) drug therapy: Secondary | ICD-10-CM | POA: Insufficient documentation

## 2018-03-19 DIAGNOSIS — I493 Ventricular premature depolarization: Secondary | ICD-10-CM | POA: Diagnosis not present

## 2018-03-19 DIAGNOSIS — Z87891 Personal history of nicotine dependence: Secondary | ICD-10-CM | POA: Diagnosis not present

## 2018-03-19 DIAGNOSIS — G8929 Other chronic pain: Secondary | ICD-10-CM

## 2018-03-19 DIAGNOSIS — Z886 Allergy status to analgesic agent status: Secondary | ICD-10-CM | POA: Insufficient documentation

## 2018-03-19 DIAGNOSIS — R0602 Shortness of breath: Secondary | ICD-10-CM | POA: Diagnosis not present

## 2018-03-19 DIAGNOSIS — M47816 Spondylosis without myelopathy or radiculopathy, lumbar region: Secondary | ICD-10-CM | POA: Diagnosis not present

## 2018-03-19 DIAGNOSIS — M545 Low back pain: Secondary | ICD-10-CM | POA: Insufficient documentation

## 2018-03-19 DIAGNOSIS — Z881 Allergy status to other antibiotic agents status: Secondary | ICD-10-CM | POA: Insufficient documentation

## 2018-03-19 DIAGNOSIS — I959 Hypotension, unspecified: Secondary | ICD-10-CM | POA: Diagnosis not present

## 2018-03-19 MED ORDER — TRIAMCINOLONE ACETONIDE 40 MG/ML IJ SUSP
80.0000 mg | Freq: Once | INTRAMUSCULAR | Status: AC
  Administered 2018-03-19: 80 mg

## 2018-03-19 MED ORDER — DEXAMETHASONE SODIUM PHOSPHATE 10 MG/ML IJ SOLN: 20.0000 mg | Freq: Once | INTRAMUSCULAR | Status: DC

## 2018-03-19 MED ORDER — LACTATED RINGERS IV SOLN
1000.0000 mL | Freq: Once | INTRAVENOUS | Status: AC
  Administered 2018-03-19: 1000 mL via INTRAVENOUS

## 2018-03-19 MED ORDER — TRIAMCINOLONE ACETONIDE 40 MG/ML IJ SUSP
INTRAMUSCULAR | Status: AC
  Filled 2018-03-19: qty 2

## 2018-03-19 MED ORDER — ROPIVACAINE HCL 2 MG/ML IJ SOLN
18.0000 mL | Freq: Once | INTRAMUSCULAR | Status: AC
  Administered 2018-03-19: 18 mL via PERINEURAL
  Filled 2018-03-19: qty 20

## 2018-03-19 MED ORDER — LIDOCAINE HCL 2 % IJ SOLN
20.0000 mL | Freq: Once | INTRAMUSCULAR | Status: AC
  Administered 2018-03-19: 400 mg
  Filled 2018-03-19: qty 40

## 2018-03-19 MED ORDER — MIDAZOLAM HCL 5 MG/5ML IJ SOLN
1.0000 mg | INTRAMUSCULAR | Status: DC | PRN
  Administered 2018-03-19: 3 mg via INTRAVENOUS
  Filled 2018-03-19: qty 5

## 2018-03-19 MED ORDER — FENTANYL CITRATE (PF) 100 MCG/2ML IJ SOLN
25.0000 ug | INTRAMUSCULAR | Status: DC | PRN
  Administered 2018-03-19: 100 ug via INTRAVENOUS
  Filled 2018-03-19: qty 2

## 2018-03-19 NOTE — Progress Notes (Signed)
Safety precautions to be maintained throughout the outpatient stay will include: orient to surroundings, keep bed in low position, maintain call bell within reach at all times, provide assistance with transfer out of bed and ambulation.  

## 2018-03-19 NOTE — Progress Notes (Signed)
Patient's Name: Paula Duncan  MRN: [id number]  Referring Provider: [name], FNP  DOB: July 12, 1960  PCP: [name], FNP  DOS: 03/19/2018  Note by: Gaspar Cola, MD  Service setting: Ambulatory outpatient  Specialty: Interventional Pain Management  Patient type: Established  Location: [facility] (AMB) Pain Management Facility  Visit type: Interventional Procedure   Primary Reason for Visit: Interventional Pain Management Treatment. CC: Back Pain  Procedure:          Anesthesia, Analgesia, Anxiolysis:  Type: Lumbar Facet, Medial Branch Block(s) #3 (Post-RFA) Primary Purpose: Diagnostic Region: Posterolateral Lumbosacral Spine Level: L2, L3, L4, L5, & S1 Medial Branch Level(s). Injecting these levels blocks the L3-4, L4-5, and L5-S1 lumbar facet joints. Laterality: Bilateral  Type: Moderate (Conscious) Sedation combined with Local Anesthesia Indication(s): Analgesia and Anxiety Route: Intravenous (IV) IV Access: Secured Sedation: Meaningful verbal contact was maintained at all times during the procedure  Local Anesthetic: Lidocaine 1-2%  Position: Prone   Indications: 1. Spondylosis without myelopathy or radiculopathy, lumbosacral region   2. Lumbar facet syndrome (Bilateral) (R>L)   3. Lumbar spondylosis   4. DDD (degenerative disc disease), lumbosacral   5. Failed back surgical syndrome   6. Lumbar postlaminectomy syndrome   7. Chronic low back pain (Primary Source of Pain) (Bilateral) (R>L)    Pain Score: Pre-procedure: 6 /10 Post-procedure: 0-No pain/10  Pre-op Assessment:  Paula Duncan is a 57 y.o. (year old), female patient, seen today for interventional treatment. She  has a past surgical history that includes Spine surgery (1994); Abdominal hysterectomy; Exploratory laparotomy (6160); Cesarean section; Esophagogastroduodenoscopy (07/12/09); left heart catheterization with coronary angiogram (N/A, 04/06/2013); Ventral hernia repair (N/A, 12/26/2017); and Hernia  repair. Paula Duncan has a current medication list which includes the following prescription(s): alendronate, b complex-c, biotin w/ vitamins c & e, vitamin d3, cyclobenzaprine, dextromethorphan, gabapentin, guaifenesin-codeine, levalbuterol, oxycodone, potassium chloride sa, pravastatin, promethazine, flutter, aerochamber mv, tiotropium bromide monohydrate, trazodone, and zolpidem, and the following Facility-Administered Medications: fentanyl and midazolam. Her primarily concern today is the Back Pain  Initial Vital Signs:  Pulse/HCG Rate: (!) 102ECG Heart Rate: (!) 109 Temp: 98 F (36.7 C) Resp: 16 BP: 122/75 SpO2: 99 %  BMI: Estimated body mass index is 21.97 kg/m as calculated from the following:   Height as of this encounter: 5\' 4"  (1.626 m).   Weight as of this encounter: 128 lb (58.1 kg).  Risk Assessment: Allergies: Reviewed. She is allergic to nsaids; tramadol hcl; chantix [varenicline]; and metronidazole.  Allergy Precautions: None required Coagulopathies: Reviewed. None identified.  Blood-thinner therapy: None at this time Active Infection(s): Reviewed. None identified. Paula Duncan is afebrile  Site Confirmation: Paula Duncan was asked to confirm the procedure and laterality before marking the site Procedure checklist: Completed Consent: Before the procedure and under the influence of no sedative(s), amnesic(s), or anxiolytics, the patient was informed of the treatment options, risks and possible complications. To fulfill our ethical and legal obligations, as recommended by the American Medical Association's Code of Ethics, I have informed the patient of my clinical impression; the nature and purpose of the treatment or procedure; the risks, benefits, and possible complications of the intervention; the alternatives, including doing nothing; the risk(s) and benefit(s) of the alternative treatment(s) or procedure(s); and the risk(s) and benefit(s) of doing nothing. The patient was  provided information about the general risks and possible complications associated with the procedure. These may include, but are not limited to: failure to achieve desired goals, infection, bleeding, organ or nerve  damage, allergic reactions, paralysis, and death. In addition, the patient was informed of those risks and complications associated to Spine-related procedures, such as failure to decrease pain; infection (i.e.: Meningitis, epidural or intraspinal abscess); bleeding (i.e.: epidural hematoma, subarachnoid hemorrhage, or any other type of intraspinal or peri-dural bleeding); organ or nerve damage (i.e.: Any type of peripheral nerve, nerve root, or spinal cord injury) with subsequent damage to sensory, motor, and/or autonomic systems, resulting in permanent pain, numbness, and/or weakness of one or several areas of the body; allergic reactions; (i.e.: anaphylactic reaction); and/or death. Furthermore, the patient was informed of those risks and complications associated with the medications. These include, but are not limited to: allergic reactions (i.e.: anaphylactic or anaphylactoid reaction(s)); adrenal axis suppression; blood sugar elevation that in diabetics may result in ketoacidosis or comma; water retention that in patients with history of congestive heart failure may result in shortness of breath, pulmonary edema, and decompensation with resultant heart failure; weight gain; swelling or edema; medication-induced neural toxicity; particulate matter embolism and blood vessel occlusion with resultant organ, and/or nervous system infarction; and/or aseptic necrosis of one or more joints. Finally, the patient was informed that Medicine is not an exact science; therefore, there is also the possibility of unforeseen or unpredictable risks and/or possible complications that may result in a catastrophic outcome. The patient indicated having understood very clearly. We have given the patient no guarantees  and we have made no promises. Enough time was given to the patient to ask questions, all of which were answered to the patient's satisfaction. Paula Duncan has indicated that she wanted to continue with the procedure. Attestation: I, the ordering provider, attest that I have discussed with the patient the benefits, risks, side-effects, alternatives, likelihood of achieving goals, and potential problems during recovery for the procedure that I have provided informed consent. Date  Time: 03/19/2018  1:08 PM  Pre-Procedure Preparation:  Monitoring: As per clinic protocol. Respiration, ETCO2, SpO2, BP, heart rate and rhythm monitor placed and checked for adequate function Safety Precautions: Patient was assessed for positional comfort and pressure points before starting the procedure. Time-out: I initiated and conducted the "Time-out" before starting the procedure, as per protocol. The patient was asked to participate by confirming the accuracy of the "Time Out" information. Verification of the correct person, site, and procedure were performed and confirmed by me, the nursing staff, and the patient. "Time-out" conducted as per Joint Commission's Universal Protocol (UP.01.01.01). Time: 1358  Description of Procedure:          Laterality: Bilateral. The procedure was performed in identical fashion on both sides. Levels:  L2, L3, L4, L5, & S1 Medial Branch Level(s) Area Prepped: Posterior Lumbosacral Region Prepping solution: ChloraPrep (2% chlorhexidine gluconate and 70% isopropyl alcohol) Safety Precautions: Aspiration looking for blood return was conducted prior to all injections. At no point did we inject any substances, as a needle was being advanced. Before injecting, the patient was told to immediately notify me if she was experiencing any new onset of "ringing in the ears, or metallic taste in the mouth". No attempts were made at seeking any paresthesias. Safe injection practices and needle disposal  techniques used. Medications properly checked for expiration dates. SDV (single dose vial) medications used. After the completion of the procedure, all disposable equipment used was discarded in the proper designated medical waste containers. Local Anesthesia: Protocol guidelines were followed. The patient was positioned over the fluoroscopy table. The area was prepped in the usual manner. The time-out was  completed. The target area was identified using fluoroscopy. A 12-in long, straight, sterile hemostat was used with fluoroscopic guidance to locate the targets for each level blocked. Once located, the skin was marked with an approved surgical skin marker. Once all sites were marked, the skin (epidermis, dermis, and hypodermis), as well as deeper tissues (fat, connective tissue and muscle) were infiltrated with a small amount of a short-acting local anesthetic, loaded on a 10cc syringe with a 25G, 1.5-in  Needle. An appropriate amount of time was allowed for local anesthetics to take effect before proceeding to the next step. Local Anesthetic: Lidocaine 2.0% The unused portion of the local anesthetic was discarded in the proper designated containers. Technical explanation of process:  L2 Medial Branch Nerve Block (MBB): The target area for the L2 medial branch is at the junction of the postero-lateral aspect of the superior articular process and the superior, posterior, and medial edge of the transverse process of L3. Under fluoroscopic guidance, a Quincke needle was inserted until contact was made with os over the superior postero-lateral aspect of the pedicular shadow (target area). After negative aspiration for blood, 0.5 mL of the nerve block solution was injected without difficulty or complication. The needle was removed intact. L3 Medial Branch Nerve Block (MBB): The target area for the L3 medial branch is at the junction of the postero-lateral aspect of the superior articular process and the superior,  posterior, and medial edge of the transverse process of L4. Under fluoroscopic guidance, a Quincke needle was inserted until contact was made with os over the superior postero-lateral aspect of the pedicular shadow (target area). After negative aspiration for blood, 0.5 mL of the nerve block solution was injected without difficulty or complication. The needle was removed intact. L4 Medial Branch Nerve Block (MBB): The target area for the L4 medial branch is at the junction of the postero-lateral aspect of the superior articular process and the superior, posterior, and medial edge of the transverse process of L5. Under fluoroscopic guidance, a Quincke needle was inserted until contact was made with os over the superior postero-lateral aspect of the pedicular shadow (target area). After negative aspiration for blood, 0.5 mL of the nerve block solution was injected without difficulty or complication. The needle was removed intact. L5 Medial Branch Nerve Block (MBB): The target area for the L5 medial branch is at the junction of the postero-lateral aspect of the superior articular process and the superior, posterior, and medial edge of the sacral ala. Under fluoroscopic guidance, a Quincke needle was inserted until contact was made with os over the superior postero-lateral aspect of the pedicular shadow (target area). After negative aspiration for blood, 0.5 mL of the nerve block solution was injected without difficulty or complication. The needle was removed intact. S1 Medial Branch Nerve Block (MBB): The target area for the S1 medial branch is at the posterior and inferior 6 o'clock position of the L5-S1 facet joint. Under fluoroscopic guidance, the Quincke needle inserted for the L5 MBB was redirected until contact was made with os over the inferior and postero aspect of the sacrum, at the 6 o' clock position under the L5-S1 facet joint (Target area). After negative aspiration for blood, 0.5 mL of the nerve block  solution was injected without difficulty or complication. The needle was removed intact. Procedural Needles: 22-gauge, 3.5-inch, Quincke needles used for all levels. Nerve block solution: 0.2% PF-Ropivacaine + Triamcinolone (40 mg/mL) diluted to a final concentration of 4 mg of Triamcinolone/mL of Ropivacaine The  unused portion of the solution was discarded in the proper designated containers.  Once the entire procedure was completed, the treated area was cleaned, making sure to leave some of the prepping solution back to take advantage of its long term bactericidal properties.   Illustration of the posterior view of the lumbar spine and the posterior neural structures. Laminae of L2 through S1 are labeled. DPRL5, dorsal primary ramus of L5; DPRS1, dorsal primary ramus of S1; DPR3, dorsal primary ramus of L3; FJ, facet (zygapophyseal) joint L3-L4; I, inferior articular process of L4; LB1, lateral branch of dorsal primary ramus of L1; IAB, inferior articular branches from L3 medial branch (supplies L4-L5 facet joint); IBP, intermediate branch plexus; MB3, medial branch of dorsal primary ramus of L3; NR3, third lumbar nerve root; S, superior articular process of L5; SAB, superior articular branches from L4 (supplies L4-5 facet joint also); TP3, transverse process of L3.  Vitals:   03/19/18 1412 03/19/18 1422 03/19/18 1432 03/19/18 1441  BP: (!) 116/53 (!) 109/59 114/73 110/60  Pulse:      Resp: 18 10 15 16   Temp:  98.2 F (36.8 C)  98.1 F (36.7 C)  SpO2: 97% 97% 99% 99%  Weight:      Height:         Start Time: 1358 hrs. End Time: 1412 hrs.  Imaging Guidance (Spinal):          Type of Imaging Technique: Fluoroscopy Guidance (Spinal) Indication(s): Assistance in needle guidance and placement for procedures requiring needle placement in or near specific anatomical locations not easily accessible without such assistance. Exposure Time: Please see nurses notes. Contrast: None  used. Fluoroscopic Guidance: I was personally present during the use of fluoroscopy. "Tunnel Vision Technique" used to obtain the best possible view of the target area. Parallax error corrected before commencing the procedure. "Direction-depth-direction" technique used to introduce the needle under continuous pulsed fluoroscopy. Once target was reached, antero-posterior, oblique, and lateral fluoroscopic projection used confirm needle placement in all planes. Images permanently stored in EMR. Interpretation: No contrast injected. I personally interpreted the imaging intraoperatively. Adequate needle placement confirmed in multiple planes. Permanent images saved into the patient's record.  Antibiotic Prophylaxis:   Anti-infectives (From admission, onward)   None     Indication(s): None identified  Post-operative Assessment:  Post-procedure Vital Signs:  Pulse/HCG Rate: (!) 10298 Temp: 98.1 F (36.7 C) Resp: 16 BP: 110/60 SpO2: 99 %  EBL: None  Complications: No immediate post-treatment complications observed by team, or reported by patient.  Note: The patient tolerated the entire procedure well. A repeat set of vitals were taken after the procedure and the patient was kept under observation following institutional policy, for this type of procedure. Post-procedural neurological assessment was performed, showing return to baseline, prior to discharge. The patient was provided with post-procedure discharge instructions, including a section on how to identify potential problems. Should any problems arise concerning this procedure, the patient was given instructions to immediately contact us, at any time, without hesitation. In any case, we plan to contact the patient by telephone for a follow-up status report regarding this interventional procedure.  Comments:  No additional relevant information.  Plan of Care    Imaging Orders     DG C-Arm 1-60 Min-No Report  Procedure Orders      LUMBAR FACET(MEDIAL BRANCH NERVE BLOCK) MBNB  Medications ordered for procedure: Meds ordered this encounter  Medications  . lidocaine (XYLOCAINE) 2 % (with pres) injection 400 mg  . midazolam (  VERSED) 5 MG/5ML injection 1-2 mg    Make sure Flumazenil is available in the pyxis when using this medication. If oversedation occurs, administer 0.2 mg IV over 15 sec. If after 45 sec no response, administer 0.2 mg again over 1 min; may repeat at 1 min intervals; not to exceed 4 doses (1 mg)  . fentaNYL (SUBLIMAZE) injection 25-50 mcg    Make sure Narcan is available in the pyxis when using this medication. In the event of respiratory depression (RR< 8/min): Titrate NARCAN (naloxone) in increments of 0.1 to 0.2 mg IV at 2-3 minute intervals, until desired degree of reversal.  . lactated ringers infusion 1,000 mL  . ropivacaine (PF) 2 mg/mL (0.2%) (NAROPIN) injection 18 mL  . DISCONTD: dexamethasone (DECADRON) injection 20 mg  . triamcinolone acetonide (KENALOG-40) injection 80 mg   Medications administered: We administered lidocaine, midazolam, fentaNYL, lactated ringers, ropivacaine (PF) 2 mg/mL (0.2%), and triamcinolone acetonide.  See the medical record for exact dosing, route, and time of administration.  Disposition: Discharge home  Discharge Date & Time: 03/19/2018; 1442 hrs.   Physician-requested Follow-up: Return for post-procedure eval (2 wks), w/ Dr. Dossie Arbour.  Future Appointments  Date Time Provider [place]  04/13/2018 11:15 AM Milinda Pointer, MD [facility] None  05/13/2018  9:00 AM Vevelyn Francois, NP [facility] None   Primary Care Physician: [name], FNP Location: [facility] Note by: Gaspar Cola, MD Date: 03/19/2018; Time: 3:30 PM  Disclaimer:  Medicine is not an Chief Strategy Officer. The only guarantee in medicine is that nothing is guaranteed. It is important to note that the decision to proceed with this intervention was  based on the information collected from the patient. The Data and conclusions were drawn from the patient's questionnaire, the interview, and the physical examination. Because the information was provided in large part by the patient, it cannot be guaranteed that it has not been purposely or unconsciously manipulated. Every effort has been made to obtain as much relevant data as possible for this evaluation. It is important to note that the conclusions that lead to this procedure are derived in large part from the available data. Always take into account that the treatment will also be dependent on availability of resources and existing treatment guidelines, considered by other Pain Management Practitioners as being common knowledge and practice, at the time of the intervention. For Medico-Legal purposes, it is also important to point out that variation in procedural techniques and pharmacological choices are the acceptable norm. The indications, contraindications, technique, and results of the above procedure should only be interpreted and judged by a Board-Certified Interventional Pain Specialist with extensive familiarity and expertise in the same exact procedure and technique.

## 2018-03-19 NOTE — Patient Instructions (Signed)

## 2018-03-20 ENCOUNTER — Telehealth: Payer: Self-pay

## 2018-03-20 DIAGNOSIS — Z0001 Encounter for general adult medical examination with abnormal findings: Secondary | ICD-10-CM | POA: Diagnosis not present

## 2018-03-20 DIAGNOSIS — E7800 Pure hypercholesterolemia, unspecified: Secondary | ICD-10-CM | POA: Diagnosis not present

## 2018-03-20 DIAGNOSIS — J449 Chronic obstructive pulmonary disease, unspecified: Secondary | ICD-10-CM | POA: Diagnosis not present

## 2018-03-20 DIAGNOSIS — Z5320 Procedure and treatment not carried out because of patient's decision for unspecified reasons: Secondary | ICD-10-CM | POA: Diagnosis not present

## 2018-03-20 DIAGNOSIS — E539 Vitamin B deficiency, unspecified: Secondary | ICD-10-CM | POA: Diagnosis not present

## 2018-03-20 DIAGNOSIS — Z1389 Encounter for screening for other disorder: Secondary | ICD-10-CM | POA: Diagnosis not present

## 2018-03-20 DIAGNOSIS — J019 Acute sinusitis, unspecified: Secondary | ICD-10-CM | POA: Diagnosis not present

## 2018-03-20 NOTE — Telephone Encounter (Signed)
Post procedure phone call.  LM 

## 2018-04-06 DIAGNOSIS — E538 Deficiency of other specified B group vitamins: Secondary | ICD-10-CM | POA: Diagnosis not present

## 2018-04-06 DIAGNOSIS — Z5320 Procedure and treatment not carried out because of patient's decision for unspecified reasons: Secondary | ICD-10-CM | POA: Diagnosis not present

## 2018-04-06 DIAGNOSIS — I10 Essential (primary) hypertension: Secondary | ICD-10-CM | POA: Diagnosis not present

## 2018-04-06 DIAGNOSIS — R5383 Other fatigue: Secondary | ICD-10-CM | POA: Diagnosis not present

## 2018-04-08 ENCOUNTER — Telehealth: Payer: Self-pay | Admitting: Pulmonary Disease

## 2018-04-08 NOTE — Telephone Encounter (Signed)
Called and spoke to patient, requesting refill of Guaifenesin-Codeine.   Last OV 10.24.19  Last Filled 10..24.2019 73ml TID PRN #132ml  AO please advise if okay to refill

## 2018-04-09 NOTE — Telephone Encounter (Signed)
Pt is calling back for an update. Cb is [phone].

## 2018-04-09 NOTE — Telephone Encounter (Signed)
Called and spoke with patient advised that we are still waiting on AO response.   AO please advise, thank you.

## 2018-04-09 NOTE — Telephone Encounter (Signed)
Pt calling to check status of cough syrup refill request.  Advised that we are awaiting AO's approval and will update her once we hear from him.  Pt expressed understanding.  AO please advise if ok to refill cough syrup as requested.  Thanks!

## 2018-04-09 NOTE — Telephone Encounter (Signed)
Patient called back to follow up on this message;[facility] on [address] in [place] pt contact # [phone];

## 2018-04-10 ENCOUNTER — Other Ambulatory Visit: Payer: Self-pay

## 2018-04-10 MED ORDER — GUAIFENESIN-CODEINE 100-10 MG/5ML PO SYRP: 5.0000 mL | ORAL_SOLUTION | Freq: Three times a day (TID) | ORAL | 0 refills | Status: DC | PRN

## 2018-04-10 NOTE — Telephone Encounter (Signed)
Called and spoke with pharmacy we are unable to fax this in due to the medication. I have called the patient  And made her aware of this she states she would call the pharmacy herself and I made her aware I would put the prescription up front for her.Nothing further needed at this time

## 2018-04-10 NOTE — Telephone Encounter (Signed)
Dr. Ander Slade, please advise if it is okay to refill cough syrup for pt as she is awaiting a response from [place] if we can refill it for her. Thanks!

## 2018-04-10 NOTE — Telephone Encounter (Signed)
Okay to refill cough medication

## 2018-04-10 NOTE — Telephone Encounter (Signed)
Spoke with pt, advised her Rx will be faxed into the pharmacy. Nothing further is needed.

## 2018-04-10 NOTE — Telephone Encounter (Signed)
Pt is calling back [phone]

## 2018-04-10 NOTE — Patient Outreach (Signed)
[facility] [place] [facility]) Care Management  04/10/2018  Gita Dilger Apr 29, 1961 [id number]   Medication Adherence call to Mrs. Quenisha Lovins spoke with patient she is due on Pravastatin 20 mg  patient has medication at this time and will order it wants she is finished with what she have.Mrs. Motter is showing past due under [place].   [facility] Direct Dial [phone]  Fax [phone] [email or web address]
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
## 2018-04-13 ENCOUNTER — Ambulatory Visit: Payer: Medicare Other | Admitting: Pain Medicine

## 2018-04-13 ENCOUNTER — Telehealth: Payer: Self-pay | Admitting: Pulmonary Disease

## 2018-04-13 NOTE — Telephone Encounter (Signed)
Spoke with pt, she states the pharmacist told her that we could fax the Rx to the pharmacy. I called the pharmacy to confirm and they agreed that it could be faxed in. I spoke with the pharmacist and gave the Rx verbally. I called pt to let her know and nothing further is needed.

## 2018-04-20 ENCOUNTER — Other Ambulatory Visit: Payer: Self-pay

## 2018-04-20 ENCOUNTER — Encounter: Payer: Self-pay | Admitting: Emergency Medicine

## 2018-04-20 ENCOUNTER — Emergency Department
Admission: EM | Admit: 2018-04-20 | Discharge: 2018-04-20 | Disposition: A | Discharge status: Home / Self Care | Payer: Medicare Other | Attending: Emergency Medicine | Admitting: Emergency Medicine

## 2018-04-20 ENCOUNTER — Emergency Department: Payer: Medicare Other

## 2018-04-20 DIAGNOSIS — F17210 Nicotine dependence, cigarettes, uncomplicated: Secondary | ICD-10-CM | POA: Insufficient documentation

## 2018-04-20 DIAGNOSIS — J449 Chronic obstructive pulmonary disease, unspecified: Secondary | ICD-10-CM | POA: Insufficient documentation

## 2018-04-20 DIAGNOSIS — Z79899 Other long term (current) drug therapy: Secondary | ICD-10-CM | POA: Insufficient documentation

## 2018-04-20 DIAGNOSIS — M545 Low back pain, unspecified: Secondary | ICD-10-CM

## 2018-04-20 DIAGNOSIS — W19XXXA Unspecified fall, initial encounter: Secondary | ICD-10-CM | POA: Insufficient documentation

## 2018-04-20 DIAGNOSIS — Y92009 Unspecified place in unspecified non-institutional (private) residence as the place of occurrence of the external cause: Secondary | ICD-10-CM

## 2018-04-20 DIAGNOSIS — I10 Essential (primary) hypertension: Secondary | ICD-10-CM | POA: Diagnosis not present

## 2018-04-20 MED ORDER — ORPHENADRINE CITRATE 30 MG/ML IJ SOLN
60.0000 mg | INTRAMUSCULAR | Status: AC
  Administered 2018-04-20: 60 mg via INTRAMUSCULAR
  Filled 2018-04-20: qty 2

## 2018-04-20 NOTE — ED Notes (Signed)
See triage note  presents with lower back pain  States pain started after falling yesterday

## 2018-04-20 NOTE — ED Triage Notes (Signed)
Fell yesterday, low back pain since.

## 2018-04-20 NOTE — Discharge Instructions (Signed)
Your exam is consistent with a back contusion. There is no evidence of fracture or dislocation. Take your home meds and follow-up with your provider for ongoing symptoms.
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
## 2018-04-22 ENCOUNTER — Inpatient Hospital Stay (HOSPITAL_COMMUNITY)
Admission: EM | Admit: 2018-04-22 | Discharge: 2018-04-28 | DRG: 871 | Disposition: A | Discharge status: Home / Self Care | Payer: Medicare Other | Attending: Family Medicine | Admitting: Family Medicine

## 2018-04-22 ENCOUNTER — Emergency Department (HOSPITAL_COMMUNITY): Payer: Medicare Other

## 2018-04-22 ENCOUNTER — Other Ambulatory Visit: Payer: Self-pay

## 2018-04-22 ENCOUNTER — Encounter (HOSPITAL_COMMUNITY): Payer: Self-pay | Admitting: *Deleted

## 2018-04-22 DIAGNOSIS — J1008 Influenza due to other identified influenza virus with other specified pneumonia: Secondary | ICD-10-CM | POA: Diagnosis present

## 2018-04-22 DIAGNOSIS — G894 Chronic pain syndrome: Secondary | ICD-10-CM | POA: Diagnosis not present

## 2018-04-22 DIAGNOSIS — R0603 Acute respiratory distress: Secondary | ICD-10-CM

## 2018-04-22 DIAGNOSIS — K7200 Acute and subacute hepatic failure without coma: Secondary | ICD-10-CM | POA: Diagnosis not present

## 2018-04-22 DIAGNOSIS — A4189 Other specified sepsis: Principal | ICD-10-CM | POA: Diagnosis present

## 2018-04-22 DIAGNOSIS — J189 Pneumonia, unspecified organism: Secondary | ICD-10-CM | POA: Diagnosis present

## 2018-04-22 DIAGNOSIS — F17210 Nicotine dependence, cigarettes, uncomplicated: Secondary | ICD-10-CM | POA: Diagnosis present

## 2018-04-22 DIAGNOSIS — G4700 Insomnia, unspecified: Secondary | ICD-10-CM | POA: Diagnosis present

## 2018-04-22 DIAGNOSIS — J441 Chronic obstructive pulmonary disease with (acute) exacerbation: Secondary | ICD-10-CM | POA: Diagnosis present

## 2018-04-22 DIAGNOSIS — E785 Hyperlipidemia, unspecified: Secondary | ICD-10-CM | POA: Diagnosis present

## 2018-04-22 DIAGNOSIS — J9601 Acute respiratory failure with hypoxia: Secondary | ICD-10-CM | POA: Diagnosis not present

## 2018-04-22 DIAGNOSIS — M542 Cervicalgia: Secondary | ICD-10-CM | POA: Diagnosis not present

## 2018-04-22 DIAGNOSIS — Z800 Family history of malignant neoplasm of digestive organs: Secondary | ICD-10-CM | POA: Diagnosis not present

## 2018-04-22 DIAGNOSIS — R6520 Severe sepsis without septic shock: Secondary | ICD-10-CM | POA: Diagnosis present

## 2018-04-22 DIAGNOSIS — E876 Hypokalemia: Secondary | ICD-10-CM | POA: Diagnosis not present

## 2018-04-22 DIAGNOSIS — J449 Chronic obstructive pulmonary disease, unspecified: Secondary | ICD-10-CM | POA: Diagnosis not present

## 2018-04-22 DIAGNOSIS — I129 Hypertensive chronic kidney disease with stage 1 through stage 4 chronic kidney disease, or unspecified chronic kidney disease: Secondary | ICD-10-CM | POA: Diagnosis present

## 2018-04-22 DIAGNOSIS — R000 Tachycardia, unspecified: Secondary | ICD-10-CM | POA: Diagnosis not present

## 2018-04-22 DIAGNOSIS — R0902 Hypoxemia: Secondary | ICD-10-CM | POA: Diagnosis not present

## 2018-04-22 DIAGNOSIS — G8220 Paraplegia, unspecified: Secondary | ICD-10-CM | POA: Diagnosis present

## 2018-04-22 DIAGNOSIS — Z8371 Family history of colonic polyps: Secondary | ICD-10-CM

## 2018-04-22 DIAGNOSIS — D649 Anemia, unspecified: Secondary | ICD-10-CM | POA: Diagnosis not present

## 2018-04-22 DIAGNOSIS — A419 Sepsis, unspecified organism: Secondary | ICD-10-CM | POA: Diagnosis not present

## 2018-04-22 DIAGNOSIS — F5104 Psychophysiologic insomnia: Secondary | ICD-10-CM | POA: Diagnosis present

## 2018-04-22 DIAGNOSIS — Z811 Family history of alcohol abuse and dependence: Secondary | ICD-10-CM

## 2018-04-22 DIAGNOSIS — K219 Gastro-esophageal reflux disease without esophagitis: Secondary | ICD-10-CM | POA: Diagnosis not present

## 2018-04-22 DIAGNOSIS — N182 Chronic kidney disease, stage 2 (mild): Secondary | ICD-10-CM | POA: Diagnosis not present

## 2018-04-22 DIAGNOSIS — R05 Cough: Secondary | ICD-10-CM | POA: Diagnosis not present

## 2018-04-22 DIAGNOSIS — N179 Acute kidney failure, unspecified: Secondary | ICD-10-CM | POA: Diagnosis present

## 2018-04-22 DIAGNOSIS — M549 Dorsalgia, unspecified: Secondary | ICD-10-CM | POA: Diagnosis present

## 2018-04-22 DIAGNOSIS — Z79891 Long term (current) use of opiate analgesic: Secondary | ICD-10-CM

## 2018-04-22 DIAGNOSIS — J1000 Influenza due to other identified influenza virus with unspecified type of pneumonia: Secondary | ICD-10-CM | POA: Diagnosis present

## 2018-04-22 DIAGNOSIS — J101 Influenza due to other identified influenza virus with other respiratory manifestations: Secondary | ICD-10-CM | POA: Diagnosis not present

## 2018-04-22 DIAGNOSIS — M810 Age-related osteoporosis without current pathological fracture: Secondary | ICD-10-CM | POA: Diagnosis present

## 2018-04-22 DIAGNOSIS — J154 Pneumonia due to other streptococci: Secondary | ICD-10-CM | POA: Diagnosis present

## 2018-04-22 DIAGNOSIS — Z90710 Acquired absence of both cervix and uterus: Secondary | ICD-10-CM

## 2018-04-22 DIAGNOSIS — J440 Chronic obstructive pulmonary disease with acute lower respiratory infection: Secondary | ICD-10-CM | POA: Diagnosis present

## 2018-04-22 DIAGNOSIS — R0602 Shortness of breath: Secondary | ICD-10-CM

## 2018-04-22 DIAGNOSIS — R0781 Pleurodynia: Secondary | ICD-10-CM | POA: Diagnosis present

## 2018-04-22 DIAGNOSIS — I959 Hypotension, unspecified: Secondary | ICD-10-CM | POA: Diagnosis not present

## 2018-04-22 LAB — CBC WITH DIFFERENTIAL/PLATELET
Abs Immature Granulocytes: 0.04 10*3/uL (ref 0.00–0.07)
Basophils Absolute: 0 10*3/uL (ref 0.0–0.1)
Basophils Relative: 0 %
Eosinophils Absolute: 0.1 10*3/uL (ref 0.0–0.5)
Eosinophils Relative: 1 %
HCT: 29 % — ABNORMAL LOW (ref 36.0–46.0)
Hemoglobin: 9.5 g/dL — ABNORMAL LOW (ref 12.0–15.0)
Immature Granulocytes: 1 %
LYMPHS PCT: 4 %
Lymphs Abs: 0.3 10*3/uL — ABNORMAL LOW (ref 0.7–4.0)
MCH: 30.2 pg (ref 26.0–34.0)
MCHC: 32.8 g/dL (ref 30.0–36.0)
MCV: 92.1 fL (ref 80.0–100.0)
Monocytes Absolute: 0.6 10*3/uL (ref 0.1–1.0)
Monocytes Relative: 8 %
Neutro Abs: 6 10*3/uL (ref 1.7–7.7)
Neutrophils Relative %: 86 %
Platelets: 173 10*3/uL (ref 150–400)
RBC: 3.15 MIL/uL — AB (ref 3.87–5.11)
RDW: 13.4 % (ref 11.5–15.5)
WBC: 7 10*3/uL (ref 4.0–10.5)
nRBC: 0 % (ref 0.0–0.2)

## 2018-04-22 LAB — COMPREHENSIVE METABOLIC PANEL
ALT: 101 U/L — ABNORMAL HIGH (ref 0–44)
AST: 145 U/L — ABNORMAL HIGH (ref 15–41)
Albumin: 2.9 g/dL — ABNORMAL LOW (ref 3.5–5.0)
Alkaline Phosphatase: 83 U/L (ref 38–126)
Anion gap: 12 (ref 5–15)
BUN: 13 mg/dL (ref 6–20)
CHLORIDE: 102 mmol/L (ref 98–111)
CO2: 21 mmol/L — ABNORMAL LOW (ref 22–32)
Calcium: 8.5 mg/dL — ABNORMAL LOW (ref 8.9–10.3)
Creatinine, Ser: 1.21 mg/dL — ABNORMAL HIGH (ref 0.44–1.00)
GFR calc Af Amer: 57 mL/min — ABNORMAL LOW (ref >60)
GFR calc non Af Amer: 49 mL/min — ABNORMAL LOW (ref >60)
Glucose, Bld: 76 mg/dL (ref 70–99)
Potassium: 3.2 mmol/L — ABNORMAL LOW (ref 3.5–5.1)
Sodium: 135 mmol/L (ref 135–145)
Total Bilirubin: 0.7 mg/dL (ref 0.3–1.2)
Total Protein: 5.9 g/dL — ABNORMAL LOW (ref 6.5–8.1)

## 2018-04-22 LAB — INFLUENZA PANEL BY PCR (TYPE A & B)
Influenza A By PCR: POSITIVE — AB
Influenza B By PCR: NEGATIVE

## 2018-04-22 LAB — I-STAT CG4 LACTIC ACID, ED: LACTIC ACID, VENOUS: 1.25 mmol/L (ref 0.5–1.9)

## 2018-04-22 MED ORDER — SODIUM CHLORIDE 0.9 % IV SOLN
INTRAVENOUS | Status: DC
  Administered 2018-04-22 – 2018-04-23 (×2): via INTRAVENOUS

## 2018-04-22 MED ORDER — TRAZODONE HCL 50 MG PO TABS
100.0000 mg | ORAL_TABLET | Freq: Every day | ORAL | Status: DC
  Administered 2018-04-22 – 2018-04-27 (×6): 100 mg via ORAL
  Filled 2018-04-22 (×6): qty 2

## 2018-04-22 MED ORDER — LEVALBUTEROL HCL 0.63 MG/3ML IN NEBU
0.6300 mg | INHALATION_SOLUTION | Freq: Three times a day (TID) | RESPIRATORY_TRACT | Status: DC
  Administered 2018-04-23 – 2018-04-26 (×12): 0.63 mg via RESPIRATORY_TRACT
  Filled 2018-04-22 (×11): qty 3

## 2018-04-22 MED ORDER — SODIUM CHLORIDE 0.9 % IV SOLN
500.0000 mg | INTRAVENOUS | Status: DC
  Administered 2018-04-22: 500 mg via INTRAVENOUS
  Filled 2018-04-22: qty 500

## 2018-04-22 MED ORDER — LEVALBUTEROL HCL 0.63 MG/3ML IN NEBU
0.6300 mg | INHALATION_SOLUTION | Freq: Three times a day (TID) | RESPIRATORY_TRACT | Status: DC
  Administered 2018-04-22: 0.63 mg via RESPIRATORY_TRACT
  Filled 2018-04-22: qty 3

## 2018-04-22 MED ORDER — ZOLPIDEM TARTRATE 5 MG PO TABS
5.0000 mg | ORAL_TABLET | Freq: Every day | ORAL | Status: DC
  Administered 2018-04-22 – 2018-04-26 (×4): 5 mg via ORAL
  Filled 2018-04-22 (×4): qty 1

## 2018-04-22 MED ORDER — ACETAMINOPHEN 500 MG PO TABS
1000.0000 mg | ORAL_TABLET | Freq: Once | ORAL | Status: AC
  Administered 2018-04-22: 1000 mg via ORAL
  Filled 2018-04-22: qty 2

## 2018-04-22 MED ORDER — CYCLOBENZAPRINE HCL 10 MG PO TABS
10.0000 mg | ORAL_TABLET | Freq: Three times a day (TID) | ORAL | Status: DC | PRN
  Administered 2018-04-23 – 2018-04-27 (×7): 10 mg via ORAL
  Filled 2018-04-22 (×7): qty 1

## 2018-04-22 MED ORDER — PRAVASTATIN SODIUM 10 MG PO TABS
20.0000 mg | ORAL_TABLET | Freq: Every day | ORAL | Status: DC
  Administered 2018-04-22 – 2018-04-27 (×6): 20 mg via ORAL
  Filled 2018-04-22 (×6): qty 2

## 2018-04-22 MED ORDER — AZITHROMYCIN 500 MG PO TABS: 500.0000 mg | ORAL_TABLET | ORAL | Status: DC

## 2018-04-22 MED ORDER — IPRATROPIUM BROMIDE 0.02 % IN SOLN
0.5000 mg | Freq: Three times a day (TID) | RESPIRATORY_TRACT | Status: DC
  Administered 2018-04-22: 0.5 mg via RESPIRATORY_TRACT
  Filled 2018-04-22: qty 2.5

## 2018-04-22 MED ORDER — LACTATED RINGERS IV BOLUS (SEPSIS)
1000.0000 mL | Freq: Once | INTRAVENOUS | Status: AC
  Administered 2018-04-22: 1000 mL via INTRAVENOUS

## 2018-04-22 MED ORDER — IPRATROPIUM BROMIDE 0.02 % IN SOLN
0.5000 mg | Freq: Three times a day (TID) | RESPIRATORY_TRACT | Status: DC
  Administered 2018-04-23 – 2018-04-26 (×12): 0.5 mg via RESPIRATORY_TRACT
  Filled 2018-04-22 (×11): qty 2.5

## 2018-04-22 MED ORDER — SODIUM CHLORIDE 0.9 % IV SOLN
2.0000 g | INTRAVENOUS | Status: DC
  Administered 2018-04-22: 2 g via INTRAVENOUS
  Filled 2018-04-22: qty 20

## 2018-04-22 MED ORDER — OXYCODONE-ACETAMINOPHEN 5-325 MG PO TABS
2.0000 | ORAL_TABLET | Freq: Once | ORAL | Status: DC
  Filled 2018-04-22: qty 2

## 2018-04-22 MED ORDER — GABAPENTIN 300 MG PO CAPS
300.0000 mg | ORAL_CAPSULE | Freq: Three times a day (TID) | ORAL | Status: DC
  Administered 2018-04-22 – 2018-04-28 (×17): 300 mg via ORAL
  Filled 2018-04-22 (×17): qty 1

## 2018-04-22 MED ORDER — DEXTROMETHORPHAN POLISTIREX ER 30 MG/5ML PO SUER
30.0000 mg | Freq: Two times a day (BID) | ORAL | Status: DC
  Administered 2018-04-23 – 2018-04-28 (×12): 30 mg via ORAL
  Filled 2018-04-22 (×13): qty 5

## 2018-04-22 MED ORDER — ENOXAPARIN SODIUM 40 MG/0.4ML ~~LOC~~ SOLN
40.0000 mg | SUBCUTANEOUS | Status: DC
  Administered 2018-04-22 – 2018-04-25 (×4): 40 mg via SUBCUTANEOUS
  Filled 2018-04-22 (×4): qty 0.4

## 2018-04-22 MED ORDER — OXYCODONE HCL 5 MG PO TABS
5.0000 mg | ORAL_TABLET | Freq: Once | ORAL | Status: AC
  Administered 2018-04-22: 5 mg via ORAL
  Filled 2018-04-22: qty 1

## 2018-04-22 MED ORDER — OXYCODONE HCL 5 MG PO TABS
5.0000 mg | ORAL_TABLET | Freq: Three times a day (TID) | ORAL | Status: DC | PRN
  Administered 2018-04-23 – 2018-04-28 (×10): 5 mg via ORAL
  Filled 2018-04-22 (×11): qty 1

## 2018-04-22 MED ORDER — POTASSIUM CHLORIDE CRYS ER 20 MEQ PO TBCR
40.0000 meq | EXTENDED_RELEASE_TABLET | Freq: Once | ORAL | Status: AC
  Administered 2018-04-22: 40 meq via ORAL
  Filled 2018-04-22: qty 2

## 2018-04-22 MED ORDER — CYCLOBENZAPRINE HCL 10 MG PO TABS
5.0000 mg | ORAL_TABLET | Freq: Once | ORAL | Status: AC
  Administered 2018-04-22: 5 mg via ORAL
  Filled 2018-04-22: qty 1

## 2018-04-22 MED ORDER — POTASSIUM CHLORIDE CRYS ER 20 MEQ PO TBCR
40.0000 meq | EXTENDED_RELEASE_TABLET | Freq: Every day | ORAL | Status: DC
  Administered 2018-04-23 – 2018-04-28 (×7): 40 meq via ORAL
  Filled 2018-04-22 (×7): qty 2

## 2018-04-22 MED ORDER — SODIUM CHLORIDE 0.9 % IV SOLN: 1.0000 g | INTRAVENOUS | Status: DC
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
## 2018-04-22 NOTE — ED Notes (Addendum)
Floor secretary advised the bed has been zapped for C-diff and just needs to be made but she would transfer this RN to the receiving RN to give report. RN also advised the bed needed to be made and then the pt can come up. Advised she would call when bed was ready. Advised RN that if nothing was heard about the bed, then pt would be coming up after 2030hrs.
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
## 2018-04-22 NOTE — ED Notes (Signed)
Patient transported to X-ray 

## 2018-04-22 NOTE — ED Provider Notes (Signed)
Patient seen by me 4:15 PM planes of shortness of breath and cough onset 2 days ago.  Patient seen by Dr. Venora Maples determined to have community-acquired pneumonia.  Lab work consistent with hypokalemia.  On exam she is in no respiratory distress, speaks in paragraphs.  Heart is tachycardic.  She has been treated with intravenous antibiotics, Tylenol and intravenous fluid bolus with normal saline.  Oral potassium supplementation ordered.  I have consulted hospitalist service will arrange for admission   Orlie Dakin, MD 04/22/18 [phone]

## 2018-04-22 NOTE — ED Triage Notes (Signed)
Pt here from home via [facility] sob since Mon.  HR 150.  Pt now states feels like something's sitting on chest.  ST on ems monitor.

## 2018-04-23 ENCOUNTER — Other Ambulatory Visit: Payer: Self-pay

## 2018-04-23 ENCOUNTER — Encounter (HOSPITAL_COMMUNITY): Payer: Self-pay | Admitting: *Deleted

## 2018-04-23 DIAGNOSIS — R000 Tachycardia, unspecified: Secondary | ICD-10-CM

## 2018-04-23 DIAGNOSIS — J101 Influenza due to other identified influenza virus with other respiratory manifestations: Secondary | ICD-10-CM

## 2018-04-23 LAB — COMPREHENSIVE METABOLIC PANEL
ALT: 103 U/L — ABNORMAL HIGH (ref 0–44)
AST: 126 U/L — AB (ref 15–41)
Albumin: 2.6 g/dL — ABNORMAL LOW (ref 3.5–5.0)
Alkaline Phosphatase: 106 U/L (ref 38–126)
Anion gap: 8 (ref 5–15)
BUN: 10 mg/dL (ref 6–20)
CO2: 24 mmol/L (ref 22–32)
Calcium: 8.2 mg/dL — ABNORMAL LOW (ref 8.9–10.3)
Chloride: 107 mmol/L (ref 98–111)
Creatinine, Ser: 1.13 mg/dL — ABNORMAL HIGH (ref 0.44–1.00)
GFR calc Af Amer: >60 mL/min (ref >60)
GFR calc non Af Amer: 53 mL/min — ABNORMAL LOW (ref >60)
Glucose, Bld: 89 mg/dL (ref 70–99)
Potassium: 4.9 mmol/L (ref 3.5–5.1)
Sodium: 139 mmol/L (ref 135–145)
Total Bilirubin: 0.5 mg/dL (ref 0.3–1.2)
Total Protein: 5.7 g/dL — ABNORMAL LOW (ref 6.5–8.1)

## 2018-04-23 LAB — CBC WITH DIFFERENTIAL/PLATELET
Abs Immature Granulocytes: 0.06 10*3/uL (ref 0.00–0.07)
Basophils Absolute: 0 10*3/uL (ref 0.0–0.1)
Basophils Relative: 0 %
Eosinophils Absolute: 0.1 10*3/uL (ref 0.0–0.5)
Eosinophils Relative: 1 %
HCT: 30.9 % — ABNORMAL LOW (ref 36.0–46.0)
Hemoglobin: 10.3 g/dL — ABNORMAL LOW (ref 12.0–15.0)
IMMATURE GRANULOCYTES: 1 %
Lymphocytes Relative: 7 %
Lymphs Abs: 0.4 10*3/uL — ABNORMAL LOW (ref 0.7–4.0)
MCH: 31.3 pg (ref 26.0–34.0)
MCHC: 33.3 g/dL (ref 30.0–36.0)
MCV: 93.9 fL (ref 80.0–100.0)
Monocytes Absolute: 0.4 10*3/uL (ref 0.1–1.0)
Monocytes Relative: 9 %
NEUTROS PCT: 82 %
Neutro Abs: 4.2 10*3/uL (ref 1.7–7.7)
Platelets: 202 10*3/uL (ref 150–400)
RBC: 3.29 MIL/uL — ABNORMAL LOW (ref 3.87–5.11)
RDW: 13.8 % (ref 11.5–15.5)
WBC: 5.2 10*3/uL (ref 4.0–10.5)
nRBC: 0 % (ref 0.0–0.2)

## 2018-04-23 LAB — IRON AND TIBC
Iron: 10 ug/dL — ABNORMAL LOW (ref 28–170)
Saturation Ratios: 5 % — ABNORMAL LOW (ref 10.4–31.8)
TIBC: 188 ug/dL — ABNORMAL LOW (ref 250–450)
UIBC: 178 ug/dL

## 2018-04-23 LAB — FERRITIN: Ferritin: 289 ng/mL (ref 11–307)

## 2018-04-23 LAB — TECHNOLOGIST SMEAR REVIEW: Plt Morphology: ADEQUATE

## 2018-04-23 LAB — HIV ANTIBODY (ROUTINE TESTING W REFLEX): HIV Screen 4th Generation wRfx: NONREACTIVE

## 2018-04-23 LAB — LACTATE DEHYDROGENASE: LDH: 479 U/L — ABNORMAL HIGH (ref 98–192)

## 2018-04-23 LAB — PROTIME-INR
INR: 1.05
PROTHROMBIN TIME: 13.6 s (ref 11.4–15.2)

## 2018-04-23 LAB — VITAMIN B12: Vitamin B-12: 1179 pg/mL — ABNORMAL HIGH (ref 180–914)

## 2018-04-23 MED ORDER — OSELTAMIVIR PHOSPHATE 30 MG PO CAPS
30.0000 mg | ORAL_CAPSULE | Freq: Two times a day (BID) | ORAL | Status: AC
  Administered 2018-04-23 – 2018-04-27 (×10): 30 mg via ORAL
  Filled 2018-04-23 (×10): qty 1

## 2018-04-23 MED ORDER — ACETAMINOPHEN 325 MG PO TABS
650.0000 mg | ORAL_TABLET | Freq: Once | ORAL | Status: AC
  Administered 2018-04-23: 650 mg via ORAL
  Filled 2018-04-23: qty 2

## 2018-04-23 MED ORDER — LACTATED RINGERS IV SOLN
INTRAVENOUS | Status: AC
  Administered 2018-04-23 (×2): via INTRAVENOUS

## 2018-04-23 MED ORDER — IBUPROFEN 400 MG PO TABS
400.0000 mg | ORAL_TABLET | ORAL | Status: DC | PRN
  Administered 2018-04-23: 400 mg via ORAL
  Filled 2018-04-23: qty 1
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
## 2018-04-23 NOTE — Progress Notes (Addendum)
MD paged about rectal temperature of 102.1 F. MD ordered one time dose of tylenol 650mg . Patient's liver enzymes are elevated and he wants to avoid too much tylenol.

## 2018-04-23 NOTE — Progress Notes (Signed)
SATURATION QUALIFICATIONS: (This note is used to comply with regulatory documentation for home oxygen)  Patient Saturations on Room Air at Rest = 92%  Patient Saturations on Room Air while Ambulating = 81%  Patient Saturations on 2 Liters of oxygen while Ambulating = 92%  Please briefly explain why patient needs home oxygen: Patient was very weak during ambulation, was SOB, and only made 4 feet on room air before oxygen dropped down to 81% and patient had to turn around and sit down. Patient states if she is discharged she is coming right back into the ED.

## 2018-04-23 NOTE — Progress Notes (Signed)
MD notified of patients fever of 100 but there is not an order for tylenol.

## 2018-04-24 LAB — URINALYSIS, ROUTINE W REFLEX MICROSCOPIC
Bacteria, UA: NONE SEEN
Bilirubin Urine: NEGATIVE
Glucose, UA: NEGATIVE mg/dL
Ketones, ur: 20 mg/dL — AB
Leukocytes, UA: NEGATIVE
NITRITE: NEGATIVE
Protein, ur: NEGATIVE mg/dL
Specific Gravity, Urine: 1.01 (ref 1.005–1.030)
pH: 5 (ref 5.0–8.0)

## 2018-04-24 LAB — COMPREHENSIVE METABOLIC PANEL
ALT: 71 U/L — ABNORMAL HIGH (ref 0–44)
AST: 78 U/L — ABNORMAL HIGH (ref 15–41)
Albumin: 2.2 g/dL — ABNORMAL LOW (ref 3.5–5.0)
Alkaline Phosphatase: 109 U/L (ref 38–126)
Anion gap: 10 (ref 5–15)
BUN: 9 mg/dL (ref 6–20)
CO2: 20 mmol/L — AB (ref 22–32)
Calcium: 7.6 mg/dL — ABNORMAL LOW (ref 8.9–10.3)
Chloride: 107 mmol/L (ref 98–111)
Creatinine, Ser: 1.23 mg/dL — ABNORMAL HIGH (ref 0.44–1.00)
GFR calc non Af Amer: 48 mL/min — ABNORMAL LOW (ref >60)
GFR, EST AFRICAN AMERICAN: 56 mL/min — AB (ref >60)
GLUCOSE: 78 mg/dL (ref 70–99)
Potassium: 4.4 mmol/L (ref 3.5–5.1)
SODIUM: 137 mmol/L (ref 135–145)
Total Bilirubin: 0.4 mg/dL (ref 0.3–1.2)
Total Protein: 5.1 g/dL — ABNORMAL LOW (ref 6.5–8.1)

## 2018-04-24 LAB — PROTIME-INR
INR: 1.07
Prothrombin Time: 13.8 seconds (ref 11.4–15.2)

## 2018-04-24 LAB — MAGNESIUM: Magnesium: 1.5 mg/dL — ABNORMAL LOW (ref 1.7–2.4)

## 2018-04-24 LAB — STREP PNEUMONIAE URINARY ANTIGEN: Strep Pneumo Urinary Antigen: POSITIVE — AB

## 2018-04-24 LAB — HAPTOGLOBIN: Haptoglobin: 305 mg/dL (ref 33–346)

## 2018-04-24 MED ORDER — SODIUM CHLORIDE 0.9 % IV SOLN
1.0000 g | INTRAVENOUS | Status: DC
  Administered 2018-04-24 – 2018-04-26 (×3): 1 g via INTRAVENOUS
  Filled 2018-04-24 (×3): qty 10

## 2018-04-24 MED ORDER — LACTATED RINGERS IV SOLN
INTRAVENOUS | Status: DC
  Administered 2018-04-24 – 2018-04-25 (×2): via INTRAVENOUS

## 2018-04-24 MED ORDER — PANTOPRAZOLE SODIUM 40 MG PO TBEC
40.0000 mg | DELAYED_RELEASE_TABLET | Freq: Every day | ORAL | Status: DC
  Administered 2018-04-24 – 2018-04-28 (×5): 40 mg via ORAL
  Filled 2018-04-24 (×5): qty 1

## 2018-04-24 MED ORDER — METHYLPREDNISOLONE SODIUM SUCC 125 MG IJ SOLR
60.0000 mg | Freq: Three times a day (TID) | INTRAMUSCULAR | Status: DC
  Administered 2018-04-24 – 2018-04-25 (×3): 60 mg via INTRAVENOUS
  Filled 2018-04-24 (×4): qty 2

## 2018-04-24 MED ORDER — ACETAMINOPHEN 325 MG PO TABS
650.0000 mg | ORAL_TABLET | Freq: Four times a day (QID) | ORAL | Status: DC | PRN
  Administered 2018-04-24 – 2018-04-27 (×4): 650 mg via ORAL
  Filled 2018-04-24 (×4): qty 2
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
## 2018-04-24 NOTE — Plan of Care (Signed)
  Problem: Clinical Measurements: Goal: Diagnostic test results will improve Outcome: Progressing Goal: Signs and symptoms of infection will decrease Outcome: Progressing   Problem: Respiratory: Goal: Ability to maintain adequate ventilation will improve Outcome: Progressing   

## 2018-04-24 NOTE — Care Management Note (Addendum)
Case Management Note  Patient Details  Name: Paula Duncan MRN: [id number] Date of Birth: [birth year]  Subjective/Objective:        Sepsis/ acute hypoxic respiratory failure due to influenza and Streptococcus pneumonia. From home with SOG, Marya Amsler. Independent with ADL's, no DME usage.           [address]  [facility] (Other) Creed Copper (Daughter) Pt's cell # [phone]     [phone]         [id number] Lavena Bullion  Action/Plan: Transition to home when medically stable. No presents needs identified per NCM .Marland KitchenMarland KitchenNCM will continue to monitor.  Expected Discharge Date:  04/24/18               Expected Discharge Plan:  Home/Self Care  In-House Referral:  NA  Discharge planning Services  CM Consult  Post Acute Care Choice:  NA Choice offered to:  NA  DME Arranged:  3in1/BSC, Oxygen DME Agency:  [facility]  HH Arranged:  RN,PT,OT [place], referral made with [facility]. Start of care to begin 24-48 post d/c.   Status of Service:  completed  If discussed at Long Length of Stay Meetings, dates discussed:    Additional Comments:  Sharin Mons, RN 04/24/2018, 3:15 PM

## 2018-04-24 NOTE — Evaluation (Signed)
Physical Therapy Evaluation Patient Details Name: Paula Duncan MRN: [id number] DOB: 03-25-1961 Today's Date: 04/24/2018   History of Present Illness  Pt adm with acute hypoxic respiratory failure due to flu and PNA. PMH - copd, arthritis, back pain  Clinical Impression  Pt admitted with above diagnosis and presents to PT with functional limitations due to deficits listed below (See PT problem list). Pt needs skilled PT to maximize independence and safety to allow discharge to ST-SNF unless family can provide support at home for OOB activity. Pt wanted to talk to daughters about dc plans.     Follow Up Recommendations SNF(Unless family available to assist for OOB activity)    Equipment Recommendations  Other (comment)(rollator)    Recommendations for Other Services OT consult     Precautions / Restrictions Precautions Precautions: Fall;Other (comment) Precaution Comments: watch SpO2 Restrictions Weight Bearing Restrictions: No      Mobility  Bed Mobility Overal bed mobility: Needs Assistance Bed Mobility: Supine to Sit;Sit to Supine     Supine to sit: Supervision;HOB elevated Sit to supine: Supervision   General bed mobility comments: Incr time and effort  Transfers Overall transfer level: Needs assistance Equipment used: 1 person hand held assist Transfers: Sit to/from [facility] Sit to Stand: Min guard Stand pivot transfers: Min assist       General transfer comment: Assist for balance.   Ambulation/Gait Ambulation/Gait assistance: Min assist Gait Distance (Feet): 75 Feet Assistive device: 4-wheeled walker Gait Pattern/deviations: Step-through pattern;Decreased step length - right;Decreased step length - left;Trunk flexed Gait velocity: decr Gait velocity interpretation: <1.8 ft/sec, indicate of risk for recurrent falls General Gait Details: Assist for balance. Pt fatigued and has to sit on rollator for return to room. Pt amb on 3L of O2 with  SpO2 93%.   Stairs            Wheelchair Mobility    Modified Rankin (Stroke Patients Only)       Balance Overall balance assessment: Needs assistance Sitting-balance support: No upper extremity supported;Feet supported Sitting balance-Leahy Scale: Good     Standing balance support: Single extremity supported Standing balance-Leahy Scale: Poor Standing balance comment: UE support                             Pertinent Vitals/Pain      Home Living Family/patient expects to be discharged to:: Private residence Living Arrangements: Spouse/significant other Available Help at Discharge: Family;Available PRN/intermittently Type of Home: House Home Access: Level entry     Home Layout: One level Home Equipment: None      Prior Function Level of Independence: Independent               Hand Dominance        Extremity/Trunk Assessment   Upper Extremity Assessment Upper Extremity Assessment: Generalized weakness    Lower Extremity Assessment Lower Extremity Assessment: Generalized weakness       Communication   Communication: No difficulties  Cognition Arousal/Alertness: Awake/alert Behavior During Therapy: WFL for tasks assessed/performed Overall Cognitive Status: Within Functional Limits for tasks assessed                                        General Comments      Exercises     Assessment/Plan    PT Assessment Patient needs continued PT services  PT  Problem List Decreased strength;Decreased activity tolerance;Decreased balance;Decreased mobility;Decreased knowledge of use of DME;Cardiopulmonary status limiting activity       PT Treatment Interventions DME instruction;Gait training;Functional mobility training;Therapeutic activities;Therapeutic exercise;Balance training;Patient/family education    PT Goals (Current goals can be found in the Care Plan section)  Acute Rehab PT Goals Patient Stated Goal: return  home PT Goal Formulation: With patient Time For Goal Achievement: 05/01/18 Potential to Achieve Goals: Good    Frequency Min 3X/week   Barriers to discharge        Co-evaluation               AM-PAC PT "6 Clicks" Mobility  Outcome Measure Help needed turning from your back to your side while in a flat bed without using bedrails?: None Help needed moving from lying on your back to sitting on the side of a flat bed without using bedrails?: None Help needed moving to and from a bed to a chair (including a wheelchair)?: A Little Help needed standing up from a chair using your arms (e.g., wheelchair or bedside chair)?: A Little Help needed to walk in hospital room?: A Little Help needed climbing 3-5 steps with a railing? : A Lot 6 Click Score: 19    End of Session Equipment Utilized During Treatment: Gait belt;Oxygen Activity Tolerance: Patient limited by fatigue Patient left: in bed;with call bell/phone within reach Nurse Communication: Mobility status PT Visit Diagnosis: Unsteadiness on feet (R26.81);Other abnormalities of gait and mobility (R26.89);Muscle weakness (generalized) (M62.81)    Time: 4193-7902 PT Time Calculation (min) (ACUTE ONLY): 37 min   Charges:   PT Evaluation $PT Eval Moderate Complexity: 1 Mod PT Treatments $Gait Training: 8-22 mins        [place] Pager [phone] Office [place] 04/24/2018, 4:43 PM

## 2018-04-25 LAB — CBC
HCT: 30.8 % — ABNORMAL LOW (ref 36.0–46.0)
Hemoglobin: 10.2 g/dL — ABNORMAL LOW (ref 12.0–15.0)
MCH: 30.6 pg (ref 26.0–34.0)
MCHC: 33.1 g/dL (ref 30.0–36.0)
MCV: 92.5 fL (ref 80.0–100.0)
Platelets: 225 10*3/uL (ref 150–400)
RBC: 3.33 MIL/uL — ABNORMAL LOW (ref 3.87–5.11)
RDW: 14.1 % (ref 11.5–15.5)
WBC: 6.5 10*3/uL (ref 4.0–10.5)
nRBC: 0 % (ref 0.0–0.2)

## 2018-04-25 LAB — BASIC METABOLIC PANEL
Anion gap: 10 (ref 5–15)
BUN: 10 mg/dL (ref 6–20)
CO2: 22 mmol/L (ref 22–32)
Calcium: 7.6 mg/dL — ABNORMAL LOW (ref 8.9–10.3)
Chloride: 106 mmol/L (ref 98–111)
Creatinine, Ser: 0.98 mg/dL (ref 0.44–1.00)
GFR calc Af Amer: >60 mL/min (ref >60)
GFR calc non Af Amer: >60 mL/min (ref >60)
Glucose, Bld: 132 mg/dL — ABNORMAL HIGH (ref 70–99)
Potassium: 4.5 mmol/L (ref 3.5–5.1)
Sodium: 138 mmol/L (ref 135–145)

## 2018-04-25 MED ORDER — GUAIFENESIN ER 600 MG PO TB12
1200.0000 mg | ORAL_TABLET | Freq: Two times a day (BID) | ORAL | Status: DC
  Administered 2018-04-25 – 2018-04-28 (×7): 1200 mg via ORAL
  Filled 2018-04-25 (×7): qty 2

## 2018-04-25 MED ORDER — METHYLPREDNISOLONE SODIUM SUCC 40 MG IJ SOLR
40.0000 mg | Freq: Three times a day (TID) | INTRAMUSCULAR | Status: DC
  Administered 2018-04-25 – 2018-04-26 (×3): 40 mg via INTRAVENOUS
  Filled 2018-04-25 (×3): qty 1

## 2018-04-25 NOTE — Evaluation (Signed)
Occupational Therapy Evaluation Patient Details Name: Paula Duncan MRN: [id number] DOB: [birth year] Today's Date: 04/25/2018    History of Present Illness Pt adm with acute hypoxic respiratory failure due to flu and PNA. PMH - copd, arthritis, back pain   Clinical Impression   PTA, pt was living with her significant other and was performing ADLs and IADLs. Pt currently requiring Supervision for ADLs. Pt presenting with decreased activity tolerance as seen by fatigue, weakness, dizziness with activity, and decreased SpO2. Pt SpO2 dropping to 81% on room air; placed back on 2L O2. Pt reporting dizziness and feeling faint during grooming at sink; returned to bed and elevated BLEs. Discussed pt need for increased support at home with syncopal episodes and possible need for SNF. Will continue to follow acutely as admitted to facilitate safe dc.     Follow Up Recommendations  SNF;Other (comment);Supervision/Assistance - 24 hour(Pending on family support)    Equipment Recommendations  None recommended by OT    Recommendations for Other Services PT consult     Precautions / Restrictions Precautions Precautions: Fall;Other (comment) Precaution Comments: watch SpO2 Restrictions Weight Bearing Restrictions: No      Mobility Bed Mobility Overal bed mobility: Needs Assistance Bed Mobility: Supine to Sit;Sit to Supine     Supine to sit: Supervision;HOB elevated Sit to supine: Supervision   General bed mobility comments: Incr time and effort  Transfers Overall transfer level: Needs assistance Equipment used: 1 person hand held assist Transfers: Sit to/from [facility] Sit to Stand: Min guard Stand pivot transfers: Min assist       General transfer comment: Assist for balance.     Balance Overall balance assessment: Needs assistance Sitting-balance support: No upper extremity supported;Feet supported Sitting balance-Leahy Scale: Good     Standing balance  support: Single extremity supported Standing balance-Leahy Scale: Poor Standing balance comment: UE support                           ADL either performed or assessed with clinical judgement   ADL Overall ADL's : Needs assistance/impaired Eating/Feeding: Independent;Sitting   Grooming: Oral care;Supervision/safety;Set up;Standing   Upper Body Bathing: Supervision/ safety;Set up;Sitting   Lower Body Bathing: Supervison/ safety;Set up;Sit to/from stand   Upper Body Dressing : Supervision/safety;Set up;Sitting   Lower Body Dressing: Supervision/safety;Set up;Sit to/from stand   Toilet Transfer: Supervision/safety;Set up;Ambulation(simulated in room)           Functional mobility during ADLs: Supervision/safety General ADL Comments: Pt performing ADLs and functional mobility at supervision level. Pt able to perform grooming task at sink and then became light headed. Returned to bed and elevated BLEs and lowered HOB.      Vision         Perception     Praxis      Pertinent Vitals/Pain       Hand Dominance Right   Extremity/Trunk Assessment Upper Extremity Assessment Upper Extremity Assessment: Generalized weakness   Lower Extremity Assessment Lower Extremity Assessment: Generalized weakness   Cervical / Trunk Assessment Cervical / Trunk Assessment: Normal   Communication Communication Communication: No difficulties   Cognition Arousal/Alertness: Awake/alert Behavior During Therapy: WFL for tasks assessed/performed Overall Cognitive Status: Within Functional Limits for tasks assessed                                     General Comments  BP 157/89.  SpO2 dropping to 81% on RA during activity. SpO2 90s on 2L.     Exercises     Shoulder Instructions      Home Living Family/patient expects to be discharged to:: Private residence Living Arrangements: Spouse/significant other Available Help at Discharge: Family;Available  PRN/intermittently Type of Home: House Home Access: Level entry     Home Layout: One level         Bathroom Toilet: Standard     Home Equipment: None   Additional Comments: significant other works full time      Prior Functioning/Environment Level of Independence: Independent        Comments: ADLs and IADLs. does not drive, Has four dogs        OT Problem List: Decreased activity tolerance;Impaired balance (sitting and/or standing);Decreased knowledge of use of DME or AE;Decreased knowledge of precautions;Decreased safety awareness      OT Treatment/Interventions: Self-care/ADL training;Therapeutic exercise;Energy conservation;DME and/or AE instruction;Therapeutic activities;Patient/family education    OT Goals(Current goals can be found in the care plan section) Acute Rehab OT Goals Patient Stated Goal: return home OT Goal Formulation: With patient Time For Goal Achievement: 05/09/18 Potential to Achieve Goals: Good  OT Frequency: Min 2X/week   Barriers to D/C:            Co-evaluation              AM-PAC OT "6 Clicks" Daily Activity     Outcome Measure Help from another person eating meals?: None Help from another person taking care of personal grooming?: None Help from another person toileting, which includes using toliet, bedpan, or urinal?: None Help from another person bathing (including washing, rinsing, drying)?: None Help from another person to put on and taking off regular upper body clothing?: None Help from another person to put on and taking off regular lower body clothing?: None 6 Click Score: 24   End of Session Equipment Utilized During Treatment: Oxygen(2L) Nurse Communication: Mobility status;Other (comment)(BP, SpO2, chills)  Activity Tolerance: Patient tolerated treatment well;Patient limited by pain;Patient limited by fatigue Patient left: in bed;with call bell/phone within reach;with bed alarm set  OT Visit Diagnosis:  Unsteadiness on feet (R26.81);Other abnormalities of gait and mobility (R26.89);Muscle weakness (generalized) (M62.81);History of falling (Z91.81)                Time: 9628-3662 OT Time Calculation (min): 29 min Charges:  OT General Charges $OT Visit: 1 Visit OT Evaluation $OT Eval Low Complexity: 1 Low OT Treatments $Self Care/Home Management : 8-22 mins  Juston Goheen MSOT, OTR/L Acute Rehab Pager: [phone] Office: [place] 04/25/2018, 9:39 AM
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
## 2018-04-26 ENCOUNTER — Inpatient Hospital Stay (HOSPITAL_COMMUNITY): Payer: Medicare Other

## 2018-04-26 DIAGNOSIS — K7200 Acute and subacute hepatic failure without coma: Secondary | ICD-10-CM

## 2018-04-26 DIAGNOSIS — R6520 Severe sepsis without septic shock: Secondary | ICD-10-CM

## 2018-04-26 DIAGNOSIS — A419 Sepsis, unspecified organism: Secondary | ICD-10-CM

## 2018-04-26 DIAGNOSIS — J189 Pneumonia, unspecified organism: Secondary | ICD-10-CM

## 2018-04-26 DIAGNOSIS — E785 Hyperlipidemia, unspecified: Secondary | ICD-10-CM

## 2018-04-26 DIAGNOSIS — R0603 Acute respiratory distress: Secondary | ICD-10-CM

## 2018-04-26 MED ORDER — METHYLPREDNISOLONE SODIUM SUCC 125 MG IJ SOLR
60.0000 mg | Freq: Three times a day (TID) | INTRAMUSCULAR | Status: DC
  Administered 2018-04-26 – 2018-04-27 (×3): 60 mg via INTRAVENOUS
  Filled 2018-04-26 (×3): qty 2

## 2018-04-26 MED ORDER — IPRATROPIUM-ALBUTEROL 0.5-2.5 (3) MG/3ML IN SOLN
3.0000 mL | Freq: Once | RESPIRATORY_TRACT | Status: AC
  Administered 2018-04-26: 3 mL via RESPIRATORY_TRACT
  Filled 2018-04-26: qty 3

## 2018-04-26 MED ORDER — IPRATROPIUM BROMIDE 0.02 % IN SOLN
0.5000 mg | Freq: Two times a day (BID) | RESPIRATORY_TRACT | Status: DC
  Administered 2018-04-27 – 2018-04-28 (×3): 0.5 mg via RESPIRATORY_TRACT
  Filled 2018-04-26 (×3): qty 2.5

## 2018-04-26 MED ORDER — LEVALBUTEROL HCL 0.63 MG/3ML IN NEBU
0.6300 mg | INHALATION_SOLUTION | Freq: Two times a day (BID) | RESPIRATORY_TRACT | Status: DC
  Administered 2018-04-27 – 2018-04-28 (×3): 0.63 mg via RESPIRATORY_TRACT
  Filled 2018-04-26 (×3): qty 3

## 2018-04-26 MED ORDER — ENOXAPARIN SODIUM 40 MG/0.4ML ~~LOC~~ SOLN
40.0000 mg | SUBCUTANEOUS | Status: DC
  Administered 2018-04-26 – 2018-04-27 (×2): 40 mg via SUBCUTANEOUS
  Filled 2018-04-26 (×2): qty 0.4

## 2018-04-26 NOTE — Progress Notes (Signed)
Pt states she felt SOB this morning when she went to the restroom while the NT was in the room and states she felt like she was panting. O2 sats were NL on VS before getting up and going to restroom. Pt states she has a pain in her RU area under her breast that feels like a "solid pain" with exertion. Pt denies any chest pain or dizziness or HA. Spoke with on call MD and he instructed to give pt duoneb. Pt has some expiratory wheezing. Instructed pt to cough and deep breathe. Will continue to monitor pt.

## 2018-04-26 NOTE — Progress Notes (Addendum)
CSW lvm for patient daughter Amy to process discharge plan including if patient will have family support at home to assist with PT activities, or need SNF.   CSW will continue to follow up.   Update: Daughter Amy called back and reports patient will be discharging home with home health. Patient will be staying at Amy's house, they have made arrangements and family is in agreement of this discharge plan. Amy reports family support at home for patient. CSW will notify RNCM of discharge plan.   CSW signing off.   [place], [place]
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
## 2018-04-26 NOTE — Progress Notes (Signed)
Patient able to ambulate in her room with supervisor, but she refused to walk in the hallway, complaining of weakness and SOB when ambulating on long distance.

## 2018-04-27 ENCOUNTER — Encounter (HOSPITAL_COMMUNITY): Payer: Self-pay

## 2018-04-27 DIAGNOSIS — J441 Chronic obstructive pulmonary disease with (acute) exacerbation: Secondary | ICD-10-CM

## 2018-04-27 LAB — CULTURE, BLOOD (ROUTINE X 2)
Culture: NO GROWTH
Culture: NO GROWTH
Special Requests: ADEQUATE
Special Requests: ADEQUATE

## 2018-04-27 MED ORDER — CYCLOBENZAPRINE HCL 10 MG PO TABS
10.0000 mg | ORAL_TABLET | Freq: Three times a day (TID) | ORAL | Status: DC | PRN
  Administered 2018-04-27: 10 mg via ORAL
  Filled 2018-04-27: qty 1

## 2018-04-27 MED ORDER — LEVOFLOXACIN 750 MG PO TABS
750.0000 mg | ORAL_TABLET | Freq: Every day | ORAL | Status: DC
  Administered 2018-04-27 – 2018-04-28 (×2): 750 mg via ORAL
  Filled 2018-04-27 (×2): qty 1

## 2018-04-27 MED ORDER — PREDNISONE 50 MG PO TABS
60.0000 mg | ORAL_TABLET | Freq: Every day | ORAL | Status: DC
  Administered 2018-04-28: 60 mg via ORAL
  Filled 2018-04-27: qty 1

## 2018-04-27 NOTE — Progress Notes (Signed)
SATURATION QUALIFICATIONS: (This note is used to comply with regulatory documentation for home oxygen)  Patient Saturations on Room Air at Rest = 86%  Patient Saturations on Room Air while Ambulating = 80%  Patient Saturations on 3 Liters of oxygen while Ambulating = 88%  Please briefly explain why patient needs home oxygen:

## 2018-04-27 NOTE — Progress Notes (Signed)
Physical Therapy Treatment Patient Details Name: Paula Duncan MRN: [id number] DOB: [birth year] Today's Date: 04/27/2018    History of Present Illness Pt adm with acute hypoxic respiratory failure due to flu and PNA. PMH - copd, arthritis, back pain    PT Comments    Patient seen for mobility progression. Ambulating with rollator with min guard - requires use of supplemental O2 due to desat on RA at rest. Patient ambulating at a very slow pace of gait - min guard throughout for safety with verbal cueing for safety awareness and obstacle navigation. PT to continue to follow.     Follow Up Recommendations  SNF     Equipment Recommendations  Other (comment)(rollator)    Recommendations for Other Services OT consult     Precautions / Restrictions Precautions Precautions: Fall;Other (comment) Precaution Comments: watch SpO2 Restrictions Weight Bearing Restrictions: No    Mobility  Bed Mobility Overal bed mobility: Needs Assistance Bed Mobility: Supine to Sit;Sit to Supine     Supine to sit: Supervision;HOB elevated Sit to supine: Supervision      Transfers Overall transfer level: Needs assistance Equipment used: 1 person hand held assist Transfers: Sit to/from [facility] Sit to Stand: Min guard Stand pivot transfers: Min guard       General transfer comment: min guard for safety and immediate standing balance  Ambulation/Gait Ambulation/Gait assistance: Min guard;Supervision Gait Distance (Feet): 200 Feet Assistive device: 4-wheeled walker Gait Pattern/deviations: Step-through pattern;Decreased stride length;Trunk flexed Gait velocity: decreased   General Gait Details: min guard for safety; slow pace; no physical assist provided   Stairs             Wheelchair Mobility    Modified Rankin (Stroke Patients Only)       Balance Overall balance assessment: Needs assistance Sitting-balance support: No upper extremity supported;Feet  supported Sitting balance-Leahy Scale: Good     Standing balance support: Single extremity supported Standing balance-Leahy Scale: Poor Standing balance comment: UE support                            Cognition Arousal/Alertness: Awake/alert Behavior During Therapy: WFL for tasks assessed/performed Overall Cognitive Status: Within Functional Limits for tasks assessed                                        Exercises      General Comments General comments (skin integrity, edema, etc.): 86% on RA at rest; 94% on 3L O2 at rest; 87-90% on 3L with mobility - nursing notified      Pertinent Vitals/Pain Pain Assessment: No/denies pain    Home Living                      Prior Function            PT Goals (current goals can now be found in the care plan section) Acute Rehab PT Goals Patient Stated Goal: return home PT Goal Formulation: With patient Time For Goal Achievement: 05/01/18 Potential to Achieve Goals: Good Progress towards PT goals: Progressing toward goals    Frequency    Min 3X/week      PT Plan Current plan remains appropriate    Co-evaluation              AM-PAC PT "6 Clicks" Mobility   Outcome Measure  Help needed  turning from your back to your side while in a flat bed without using bedrails?: None Help needed moving from lying on your back to sitting on the side of a flat bed without using bedrails?: None Help needed moving to and from a bed to a chair (including a wheelchair)?: A Little Help needed standing up from a chair using your arms (e.g., wheelchair or bedside chair)?: A Little Help needed to walk in hospital room?: A Little Help needed climbing 3-5 steps with a railing? : A Lot 6 Click Score: 19    End of Session Equipment Utilized During Treatment: Gait belt;Oxygen Activity Tolerance: Patient tolerated treatment well Patient left: in bed;with call bell/phone within reach Nurse Communication:  Mobility status PT Visit Diagnosis: Unsteadiness on feet (R26.81);Other abnormalities of gait and mobility (R26.89);Muscle weakness (generalized) (M62.81)     Time: 5643-3295 PT Time Calculation (min) (ACUTE ONLY): 19 min  Charges:  $Gait Training: 8-22 mins                     Lanney Gins, PT, DPT Supplemental Physical Therapist 04/27/18 1:04 PM Pager: [phone] Office: [phone]

## 2018-04-27 NOTE — Plan of Care (Signed)
  Problem: Clinical Measurements: Goal: Diagnostic test results will improve Outcome: Progressing Goal: Signs and symptoms of infection will decrease Outcome: Progressing   Problem: Health Behavior/Discharge Planning: Goal: Ability to manage health-related needs will improve Outcome: Progressing   Problem: Clinical Measurements: Goal: Ability to maintain clinical measurements within normal limits will improve Outcome: Progressing Goal: Will remain free from infection Outcome: Progressing Goal: Diagnostic test results will improve Outcome: Progressing Goal: Respiratory complications will improve Outcome: Progressing Goal: Cardiovascular complication will be avoided Outcome: Progressing
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
## 2018-04-28 DIAGNOSIS — G894 Chronic pain syndrome: Secondary | ICD-10-CM

## 2018-04-28 MED ORDER — TIOTROPIUM BROMIDE MONOHYDRATE 2.5 MCG/ACT IN AERS: 2.0000 | INHALATION_SPRAY | Freq: Every day | RESPIRATORY_TRACT | 6 refills | Status: AC

## 2018-04-28 MED ORDER — IPRATROPIUM BROMIDE 0.02 % IN SOLN: 0.5000 mg | Freq: Two times a day (BID) | RESPIRATORY_TRACT | 12 refills | Status: DC

## 2018-04-28 MED ORDER — LEVOFLOXACIN 750 MG PO TABS: 750.0000 mg | ORAL_TABLET | Freq: Every day | ORAL | 0 refills | Status: AC

## 2018-04-28 MED ORDER — METHYLPREDNISOLONE 4 MG PO TABS: 4.0000 mg | ORAL_TABLET | Freq: Every day | ORAL | 0 refills | Status: DC

## 2018-04-28 MED ORDER — LEVALBUTEROL HCL 0.63 MG/3ML IN NEBU: 0.6300 mg | INHALATION_SOLUTION | Freq: Two times a day (BID) | RESPIRATORY_TRACT | 12 refills | Status: DC

## 2018-04-28 NOTE — Progress Notes (Signed)
Occupational Therapy Treatment Patient Details Name: Paula Duncan MRN: [id number] DOB: [birth year] Today's Date: 04/28/2018    History of present illness Pt adm with acute hypoxic respiratory failure due to flu and PNA. PMH - copd, arthritis, back pain   OT comments  Pt is able to perform ADLs at supervision level with DOE 3/4.  Pt is discharging home to daughter's home, who will provide assist to pt at discharge.  DOE 3/4 on 3L supplemental 02  Follow Up Recommendations  Home health OT;Supervision/Assistance - 24 hour    Equipment Recommendations  3 in 1 bedside commode    Recommendations for Other Services      Precautions / Restrictions Precautions Precautions: Fall;Other (comment) Precaution Comments: watch SpO2       Mobility Bed Mobility               General bed mobility comments: up in chair   Transfers Overall transfer level: Needs assistance Equipment used: Rolling walker (2 wheeled) Transfers: Stand Pivot Transfers;Sit to/from Stand Sit to Stand: Supervision Stand pivot transfers: Supervision            Balance Overall balance assessment: Needs assistance Sitting-balance support: No upper extremity supported Sitting balance-Leahy Scale: Good     Standing balance support: No upper extremity supported;During functional activity Standing balance-Leahy Scale: Fair                             ADL either performed or assessed with clinical judgement   ADL Overall ADL's : Needs assistance/impaired Eating/Feeding: Independent   Grooming: Wash/dry hands;Wash/dry face;Oral care;Brushing hair;Supervision/safety;Standing       Lower Body Bathing: Supervison/ safety;Sit to/from stand       Lower Body Dressing: Supervision/safety;Sit to/from stand   Toilet Transfer: Supervision/safety;Ambulation;Comfort height toilet;Grab bars;RW           Functional mobility during ADLs: Supervision/safety General ADL Comments: Discussed use of  3in1 commode for shower seat.  Pt reports daughter will provide 24 hour supervision assist at discharge      Vision       Perception     Praxis      Cognition Arousal/Alertness: Awake/alert Behavior During Therapy: WFL for tasks assessed/performed Overall Cognitive Status: Within Functional Limits for tasks assessed                                          Exercises     Shoulder Instructions       General Comments DOE 3/4 with activity, HR 103 pt on 3L supplemental 02     Pertinent Vitals/ Pain       Pain Assessment: No/denies pain  Home Living                                          Prior Functioning/Environment              Frequency  Min 2X/week        Progress Toward Goals  OT Goals(current goals can now be found in the care plan section)  Progress towards OT goals: Progressing toward goals     Plan Discharge plan needs to be updated    Co-evaluation  AM-PAC OT "6 Clicks" Daily Activity     Outcome Measure   Help from another person eating meals?: None Help from another person taking care of personal grooming?: None Help from another person toileting, which includes using toliet, bedpan, or urinal?: None Help from another person bathing (including washing, rinsing, drying)?: None Help from another person to put on and taking off regular upper body clothing?: None Help from another person to put on and taking off regular lower body clothing?: None 6 Click Score: 24    End of Session Equipment Utilized During Treatment: Oxygen  OT Visit Diagnosis: Unsteadiness on feet (R26.81);Other abnormalities of gait and mobility (R26.89);Muscle weakness (generalized) (M62.81);History of falling (Z91.81)   Activity Tolerance Patient tolerated treatment well   Patient Left in chair;with call bell/phone within reach   Nurse Communication Mobility status        Time: 1311-1330 OT Time  Calculation (min): 19 min  Charges: OT General Charges $OT Visit: 1 Visit OT Treatments $Self Care/Home Management : 8-22 mins  Lucille Passy, OTR/L [place] Pager [phone] Office [phone]    Lucille Passy M 04/28/2018, 1:41 PM

## 2018-04-28 NOTE — Consult Note (Signed)
   [facility] Inpatient Consult   04/28/2018  Paula Duncan August 26, 1960 [id number]  Patient screened for potential [facility] services. Patient is in the [place] of the [facility] Management services under patient's Medicare  plan.   Please place a [facility] Care Management consult or for questions contact:   Natividad Brood, RN BSN [facility] Liaison  [phone] business mobile phone Toll free office [phone]

## 2018-04-28 NOTE — Care Management Note (Addendum)
Case Management Note  Patient Details  Name: Paula Duncan MRN: [id number] Date of Birth: December 14, 1960  Subjective/Objective:          Sepsis/ acute hypoxic respiratory failure due to influenza and Streptococcus pneumonia. From home with SOG, Paula Duncan. Independent with ADL's, no DME usage.            Paula Duncan (Other) Paula Duncan (Daughter) Pt's cell # [phone]     [phone]         [id number] Paula Duncan          Action/Plan: Transition to home with home health services to follow, and home oxygen.  Pt will d/c to daughter's address:  [address] [place].  Pt states has transportation to home.  Expected Discharge Date:  04/24/18               Expected Discharge Plan:  Home/Self Care  In-House Referral:  NA  Discharge planning Services  CM Consult  Post Acute Care Choice:  NA Choice offered to:  pt  DME Arranged:  3-N-1, Oxygen, rolling walker, nebulizer machine  DME Agency:  [facility] Arranged:  RN, OT, PT [facility] Agency:  [place]  Status of Service:  Completed, signed off  If discussed at [place] of Stay Meetings, dates discussed:    Additional Comments:  Sharin Mons, RN 04/28/2018, 9:07 AM
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
## 2018-05-01 DIAGNOSIS — J449 Chronic obstructive pulmonary disease, unspecified: Secondary | ICD-10-CM | POA: Diagnosis not present

## 2018-05-13 ENCOUNTER — Other Ambulatory Visit: Payer: Self-pay

## 2018-05-13 ENCOUNTER — Ambulatory Visit: Payer: Medicare Other | Attending: Nurse Practitioner | Admitting: Nurse Practitioner

## 2018-05-13 ENCOUNTER — Encounter: Payer: Self-pay | Admitting: Nurse Practitioner

## 2018-05-13 VITALS — BP 131/89 | HR 111 | Temp 98.1°F | Resp 16 | Ht 64.0 in | Wt 116.0 lb

## 2018-05-13 DIAGNOSIS — M79605 Pain in left leg: Secondary | ICD-10-CM

## 2018-05-13 DIAGNOSIS — G8929 Other chronic pain: Secondary | ICD-10-CM

## 2018-05-13 DIAGNOSIS — M7918 Myalgia, other site: Secondary | ICD-10-CM | POA: Diagnosis not present

## 2018-05-13 DIAGNOSIS — G894 Chronic pain syndrome: Secondary | ICD-10-CM | POA: Diagnosis not present

## 2018-05-13 DIAGNOSIS — R252 Cramp and spasm: Secondary | ICD-10-CM | POA: Diagnosis not present

## 2018-05-13 DIAGNOSIS — M47816 Spondylosis without myelopathy or radiculopathy, lumbar region: Secondary | ICD-10-CM

## 2018-05-13 DIAGNOSIS — G4701 Insomnia due to medical condition: Secondary | ICD-10-CM | POA: Diagnosis not present

## 2018-05-13 DIAGNOSIS — M792 Neuralgia and neuritis, unspecified: Secondary | ICD-10-CM

## 2018-05-13 DIAGNOSIS — Z79891 Long term (current) use of opiate analgesic: Secondary | ICD-10-CM | POA: Diagnosis not present

## 2018-05-13 MED ORDER — GABAPENTIN 300 MG PO CAPS: 300.0000 mg | ORAL_CAPSULE | Freq: Three times a day (TID) | ORAL | 2 refills | Status: DC

## 2018-05-13 MED ORDER — CYCLOBENZAPRINE HCL 10 MG PO TABS: 10.0000 mg | ORAL_TABLET | Freq: Three times a day (TID) | ORAL | 2 refills | Status: DC | PRN

## 2018-05-13 MED ORDER — OXYCODONE HCL 5 MG PO TABS: 5.0000 mg | ORAL_TABLET | Freq: Three times a day (TID) | ORAL | 0 refills | Status: DC | PRN

## 2018-05-13 MED ORDER — TRAZODONE HCL 100 MG PO TABS: 100.0000 mg | ORAL_TABLET | Freq: Every day | ORAL | 0 refills | Status: DC

## 2018-05-13 NOTE — Progress Notes (Signed)
Nursing Pain Medication Assessment:  Safety precautions to be maintained throughout the outpatient stay will include: orient to surroundings, keep bed in low position, maintain call bell within reach at all times, provide assistance with transfer out of bed and ambulation.  Medication Inspection Compliance: Pill count conducted under aseptic conditions, in front of the patient. Neither the pills nor the bottle was removed from the patient's sight at any time. Once count was completed pills were immediately returned to the patient in their original bottle.  Medication: Oxycodone IR Pill/Patch Count: 0 of 90 pills remain Pill/Patch Appearance: Markings consistent with prescribed medication Bottle Appearance: Standard pharmacy container. Clearly labeled. Filled Date: 12/13 / 2019 Last Medication intake:  Today  States has some pills remaining, but were left at a relative's home while she was staying there.

## 2018-05-13 NOTE — Patient Instructions (Addendum)
____________________________________________________________________________________________  Medication Rules  Purpose: To inform patients, and their family members, of our rules and regulations.  Applies to: All patients receiving prescriptions (written or electronic).  Pharmacy of record: Pharmacy where electronic prescriptions will be sent. If written prescriptions are taken to a different pharmacy, please inform the nursing staff. The pharmacy listed in the electronic medical record should be the one where you would like electronic prescriptions to be sent.  Electronic prescriptions: In compliance with the [place] Strengthen Opioid Misuse Prevention (STOP) Act of 2017 (Session Law 2017-74/H243), effective May 06, 2018, all controlled substances must be electronically prescribed. Calling prescriptions to the pharmacy will cease to exist.  Prescription refills: Only during scheduled appointments. Applies to all prescriptions.  NOTE: The following applies primarily to controlled substances (Opioid* Pain Medications).   Patient's responsibilities: 1. Pain Pills: Bring all pain pills to every appointment (except for procedure appointments). 2. Pill Bottles: Bring pills in original pharmacy bottle. Always bring the newest bottle. Bring bottle, even if empty. 3. Medication refills: You are responsible for knowing and keeping track of what medications you take and those you need refilled. The day before your appointment: write a list of all prescriptions that need to be refilled. The day of the appointment: give the list to the admitting nurse. Prescriptions will be written only during appointments. If you forget a medication: it will not be "Called in", "Faxed", or "electronically sent". You will need to get another appointment to get these prescribed. No early refills. Do not call asking to have your prescription filled early. 4. Prescription Accuracy: You are responsible for  carefully inspecting your prescriptions before leaving our office. Have the discharge nurse carefully go over each prescription with you, before taking them home. Make sure that your name is accurately spelled, that your address is correct. Check the name and dose of your medication to make sure it is accurate. Check the number of pills, and the written instructions to make sure they are clear and accurate. Make sure that you are given enough medication to last until your next medication refill appointment. 5. Taking Medication: Take medication as prescribed. When it comes to controlled substances, taking less pills or less frequently than prescribed is permitted and encouraged. Never take more pills than instructed. Never take medication more frequently than prescribed.  6. Inform other Doctors: Always inform, all of your healthcare providers, of all the medications you take. 7. Pain Medication from other Providers: You are not allowed to accept any additional pain medication from any other Doctor or Healthcare provider. There are two exceptions to this rule. (see below) In the event that you require additional pain medication, you are responsible for notifying us, as stated below. 8. Medication Agreement: You are responsible for carefully reading and following our Medication Agreement. This must be signed before receiving any prescriptions from our practice. Safely store a copy of your signed Agreement. Violations to the Agreement will result in no further prescriptions. (Additional copies of our Medication Agreement are available upon request.) 9. Laws, Rules, & Regulations: All patients are expected to follow all Federal and State Laws, Statutes, Rules, & Regulations. Ignorance of the Laws does not constitute a valid excuse. The use of any illegal substances is prohibited. 10. Adopted CDC guidelines & recommendations: Target dosing levels will be at or below 60 MME/day. Use of benzodiazepines** is not  recommended.  Exceptions: There are only two exceptions to the rule of not receiving pain medications from other Healthcare Providers. 1.   Exception #1 (Emergencies): In the event of an emergency (i.e.: accident requiring emergency care), you are allowed to receive additional pain medication. However, you are responsible for: As soon as you are able, call our office [phone], at any time of the day or night, and leave a message stating your name, the date and nature of the emergency, and the name and dose of the medication prescribed. In the event that your call is answered by a member of our staff, make sure to document and save the date, time, and the name of the person that took your information.  2. Exception #2 (Planned Surgery): In the event that you are scheduled by another doctor or dentist to have any type of surgery or procedure, you are allowed (for a period no longer than 30 days), to receive additional pain medication, for the acute post-op pain. However, in this case, you are responsible for picking up a copy of our "Post-op Pain Management for Surgeons" handout, and giving it to your surgeon or dentist. This document is available at our office, and does not require an appointment to obtain it. Simply go to our office during business hours (Monday-Thursday from 8:00 AM to 4:00 PM) (Friday 8:00 AM to 12:00 Noon) or if you have a scheduled appointment with [place], prior to your surgery, and ask for it by name. In addition, you will need to provide [place] with your name, name of your surgeon, type of surgery, and date of procedure or surgery.  *Opioid medications include: morphine, codeine, oxycodone, oxymorphone, hydrocodone, hydromorphone, meperidine, tramadol, tapentadol, buprenorphine, fentanyl, methadone. **Benzodiazepine medications include: diazepam (Valium), alprazolam (Xanax), clonazepam (Klonopine), lorazepam (Ativan), clorazepate (Tranxene), chlordiazepoxide (Librium), estazolam (Prosom),  oxazepam (Serax), temazepam (Restoril), triazolam (Halcion) (Last updated: 07/03/2017) ____________________________________________________________________________________________   Oxycodone 5 mg x 3 months fill dates 05/17/18, 06/16/18, 07/16/18 escribed to your pharmacy

## 2018-05-13 NOTE — Progress Notes (Signed)
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 medication to the electronic "Automatic Refill" notification system. Patient may have prescription filled one day early if pharmacy is closed on scheduled refill date.    Order Specific Question:   Supervising Provider    Answer:   Milinda Pointer [phone]  . gabapentin (NEURONTIN) 300 MG capsule    Sig: Take 1 capsule (300 mg total) by mouth 3 (three) times daily.    Dispense:  90 capsule    Refill:  2    Order Specific Question:   Supervising Provider    Answer:   Milinda Pointer [phone]  . cyclobenzaprine (FLEXERIL) 10 MG tablet    Sig: Take 1 tablet (10 mg total) by mouth 3 (three) times daily as needed for muscle spasms.    Dispense:  90 tablet    Refill:  2    Do not place this medication, or any other prescription from our practice, on "Automatic Refill". Patient may have prescription filled one day early if pharmacy is closed on scheduled refill date.    Order Specific Question:   Supervising  Provider    Answer:   Milinda Pointer [phone]  . oxyCODONE (OXY IR/ROXICODONE) 5 MG immediate release tablet    Sig: Take 1 tablet (5 mg total) by mouth every 8 (eight) hours as needed for severe pain.    Dispense:  90 tablet    Refill:  0    Do not place this medication, or any other prescription from our practice, on "Automatic Refill". Patient may have prescription filled one day early if pharmacy is closed on scheduled refill date.    Order Specific Question:   Supervising Provider    Answer:   Milinda Pointer [phone]  . oxyCODONE (OXY IR/ROXICODONE) 5 MG immediate release tablet    Sig: Take 1 tablet (5 mg total) by mouth every 8 (eight) hours as needed for severe pain.    Dispense:  90 tablet    Refill:  0    Do not place this medication, or any other prescription from our practice, on "Automatic Refill". Patient may have prescription filled one day early if pharmacy is closed on scheduled refill date.    Order Specific Question:   Supervising Provider    Answer:   Milinda Pointer [[id number]]   New Prescriptions   No medications on file   Medications administered today: Wilsie Kern had no medications administered during this visit. Lab-work, procedure(s),  and/or referral(s): Orders Placed This Encounter  Procedures  . ToxASSURE Select 13 (MW), Urine   Imaging and/or referral(s): None  Interventional therapies: Planned, scheduled, and/or pending: Not at this time   Considering:  Possible bilateral lumbar facet radiofrequency ablation bilateral diagnostic sacroiliac joint block with 0% relief. bilateral diagnostic lumbar facet block #2with 0% relief. bilateral diagnostic lumbar facet block #1 with 100% relief. diagnostic bilateral lumbar facet block (#3) with 100% relief.   Palliative PRN treatment(s):  Palliative bilateral lumbar facet block    Provider-requested follow-up: Return in about 3 months (around 08/12/2018) for MedMgmt.  Future  Appointments  Date Time Provider [place]  08/06/2018  9:30 AM Vevelyn Francois, NP [facility] None   Primary Care Physician: [name], FNP Location: [facility] Out[facility] Pain Management Facility Note by: Vevelyn Francois NP Date: 05/13/2018; Time: 3:21 PM  Pain Score Disclaimer: We use the NRS-11 scale. This is a self-reported, subjective measurement of pain severity with only modest accuracy. It is used primarily to identify changes within a particular patient. It must be understood that outpatient pain scales are significantly less accurate that those used for research, where they can be applied under ideal controlled circumstances with minimal exposure to variables. In reality, the score is likely to be a combination of pain intensity and pain affect, where pain affect describes the degree of emotional arousal or changes in action readiness caused by the sensory experience of pain. Factors such as social and work situation, setting, emotional state, anxiety levels, expectation, and prior pain experience may influence pain perception and show large inter-individual differences that may also be affected by time variables.  Patient instructions provided during this appointment: Patient Instructions  ____________________________________________________________________________________________  Medication Rules  Purpose: To inform patients, and their family members, of our rules and regulations.  Applies to: All patients receiving prescriptions (written or electronic).  Pharmacy of record: Pharmacy where electronic prescriptions will be sent. If written prescriptions are taken to a different pharmacy, please inform the nursing staff. The pharmacy listed in the electronic medical record should be the one where you would like electronic prescriptions to be sent.  Electronic prescriptions: In compliance with the [place] (STOP) Act of 2017 (Session  Lanny Cramp [phone]), effective May 06, 2018, all controlled substances must be electronically prescribed. Calling prescriptions to the pharmacy will cease to exist.  Prescription refills: Only during scheduled appointments. Applies to all prescriptions.  NOTE: The following applies primarily to controlled substances (Opioid* Pain Medications).   Patient's responsibilities: 1. Pain Pills: Bring all pain pills to every appointment (except for procedure appointments). 2. Pill Bottles: Bring pills in original pharmacy bottle. Always bring the newest bottle. Bring bottle, even if empty. 3. Medication refills: You are responsible for knowing and keeping track of what medications you take and those you need refilled. The day before your appointment: write a list of all prescriptions that need to be refilled. The day of the appointment: give the list to the admitting nurse. Prescriptions will be written only during appointments. If you forget a medication: it will not be "Called in", "Faxed", or "electronically sent". You will need to get another appointment to get these prescribed. No early refills. Do not call asking to have your prescription filled early. 4. Prescription Accuracy: You are responsible for carefully inspecting your prescriptions before leaving our office. Have the discharge nurse carefully go over each prescription with you, before taking them home. Make sure that your name is accurately  spelled, that your address is correct. Check the name and dose of your medication to make sure it is accurate. Check the number of pills, and the written instructions to make sure they are clear and accurate. Make sure that you are given enough medication to last until your next medication refill appointment. 5. Taking Medication: Take medication as prescribed. When it comes to controlled substances, taking less pills or less frequently than prescribed is permitted and encouraged. Never take more pills  than instructed. Never take medication more frequently than prescribed.  6. Inform other Doctors: Always inform, all of your healthcare providers, of all the medications you take. 7. Pain Medication from other Providers: You are not allowed to accept any additional pain medication from any other Doctor or Healthcare provider. There are two exceptions to this rule. (see below) In the event that you require additional pain medication, you are responsible for notifying us, as stated below. 8. Medication Agreement: You are responsible for carefully reading and following our Medication Agreement. This must be signed before receiving any prescriptions from our practice. Safely store a copy of your signed Agreement. Violations to the Agreement will result in no further prescriptions. (Additional copies of our Medication Agreement are available upon request.) 9. Laws, Rules, & Regulations: All patients are expected to follow all Federal and [employer], Rules, [employer]. Ignorance of the Laws does not constitute a valid excuse. The use of any illegal substances is prohibited. 10. Adopted CDC guidelines & recommendations: Target dosing levels will be at or below 60 MME/day. Use of benzodiazepines** is not recommended.  Exceptions: There are only two exceptions to the rule of not receiving pain medications from other Healthcare Providers. 1. Exception #1 (Emergencies): In the event of an emergency (i.e.: accident requiring emergency care), you are allowed to receive additional pain medication. However, you are responsible for: As soon as you are able, call our office [phone], at any time of the day or night, and leave a message stating your name, the date and nature of the emergency, and the name and dose of the medication prescribed. In the event that your call is answered by a member of our staff, make sure to document and save the date, time, and the name of the person that took your  information.  2. Exception #2 (Planned Surgery): In the event that you are scheduled by another doctor or dentist to have any type of surgery or procedure, you are allowed (for a period no longer than 30 days), to receive additional pain medication, for the acute post-op pain. However, in this case, you are responsible for picking up a copy of our "Post-op Pain Management for Surgeons" handout, and giving it to your surgeon or dentist. This document is available at our office, and does not require an appointment to obtain it. Simply go to our office during business hours (Monday-Thursday from 8:00 AM to 4:00 PM) (Friday 8:00 AM to 12:00 Noon) or if you have a scheduled appointment with [place], prior to your surgery, and ask for it by name. In addition, you will need to provide [place] with your name, name of your surgeon, type of surgery, and date of procedure or surgery.  *Opioid medications include: morphine, codeine, oxycodone, oxymorphone, hydrocodone, hydromorphone, meperidine, tramadol, tapentadol, buprenorphine, fentanyl, methadone. **Benzodiazepine medications include: diazepam (Valium), alprazolam (Xanax), clonazepam (Klonopine), lorazepam (Ativan), clorazepate (Tranxene), chlordiazepoxide (Librium), estazolam (Prosom), oxazepam (Serax), temazepam (Restoril), triazolam (Halcion) (Last updated: 07/03/2017) ____________________________________________________________________________________________   Oxycodone 5 mg x  3 months fill dates 05/17/18, 06/16/18, 07/16/18 escribed to your pharmacy

## 2018-05-17 LAB — TOXASSURE SELECT 13 (MW), URINE

## 2018-05-18 ENCOUNTER — Other Ambulatory Visit: Payer: Self-pay | Admitting: Nurse Practitioner

## 2018-05-19 DIAGNOSIS — J449 Chronic obstructive pulmonary disease, unspecified: Secondary | ICD-10-CM | POA: Diagnosis not present

## 2018-05-29 DIAGNOSIS — J449 Chronic obstructive pulmonary disease, unspecified: Secondary | ICD-10-CM | POA: Diagnosis not present

## 2018-06-01 DIAGNOSIS — J449 Chronic obstructive pulmonary disease, unspecified: Secondary | ICD-10-CM | POA: Diagnosis not present

## 2018-06-19 DIAGNOSIS — J449 Chronic obstructive pulmonary disease, unspecified: Secondary | ICD-10-CM | POA: Diagnosis not present

## 2018-06-29 DIAGNOSIS — J449 Chronic obstructive pulmonary disease, unspecified: Secondary | ICD-10-CM | POA: Diagnosis not present

## 2018-07-02 DIAGNOSIS — I493 Ventricular premature depolarization: Secondary | ICD-10-CM | POA: Diagnosis not present

## 2018-07-02 DIAGNOSIS — E7800 Pure hypercholesterolemia, unspecified: Secondary | ICD-10-CM | POA: Diagnosis not present

## 2018-07-02 DIAGNOSIS — R0602 Shortness of breath: Secondary | ICD-10-CM | POA: Diagnosis not present

## 2018-07-02 DIAGNOSIS — J449 Chronic obstructive pulmonary disease, unspecified: Secondary | ICD-10-CM | POA: Diagnosis not present

## 2018-07-15 DIAGNOSIS — Z636 Dependent relative needing care at home: Secondary | ICD-10-CM | POA: Diagnosis not present

## 2018-08-06 ENCOUNTER — Other Ambulatory Visit: Payer: Self-pay

## 2018-08-06 ENCOUNTER — Ambulatory Visit: Payer: Medicare Other | Attending: Nurse Practitioner | Admitting: Nurse Practitioner

## 2018-08-06 DIAGNOSIS — G8929 Other chronic pain: Secondary | ICD-10-CM

## 2018-08-06 DIAGNOSIS — R252 Cramp and spasm: Secondary | ICD-10-CM | POA: Diagnosis not present

## 2018-08-06 DIAGNOSIS — G4701 Insomnia due to medical condition: Secondary | ICD-10-CM

## 2018-08-06 DIAGNOSIS — M79605 Pain in left leg: Secondary | ICD-10-CM

## 2018-08-06 DIAGNOSIS — M7918 Myalgia, other site: Secondary | ICD-10-CM | POA: Diagnosis not present

## 2018-08-06 DIAGNOSIS — M47816 Spondylosis without myelopathy or radiculopathy, lumbar region: Secondary | ICD-10-CM

## 2018-08-06 DIAGNOSIS — G894 Chronic pain syndrome: Secondary | ICD-10-CM | POA: Diagnosis not present

## 2018-08-06 DIAGNOSIS — M792 Neuralgia and neuritis, unspecified: Secondary | ICD-10-CM

## 2018-08-06 MED ORDER — OXYCODONE HCL 5 MG PO TABS: 5.0000 mg | ORAL_TABLET | Freq: Three times a day (TID) | ORAL | 0 refills | Status: DC | PRN

## 2018-08-06 MED ORDER — TRAZODONE HCL 100 MG PO TABS: 100.0000 mg | ORAL_TABLET | Freq: Every day | ORAL | 0 refills | Status: DC

## 2018-08-06 MED ORDER — GABAPENTIN 300 MG PO CAPS: 300.0000 mg | ORAL_CAPSULE | Freq: Three times a day (TID) | ORAL | 2 refills | Status: DC

## 2018-08-06 MED ORDER — CYCLOBENZAPRINE HCL 10 MG PO TABS: 10.0000 mg | ORAL_TABLET | Freq: Three times a day (TID) | ORAL | 2 refills | Status: DC | PRN
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
## 2018-08-13 DIAGNOSIS — Z79899 Other long term (current) drug therapy: Secondary | ICD-10-CM | POA: Diagnosis not present

## 2018-08-28 DIAGNOSIS — J449 Chronic obstructive pulmonary disease, unspecified: Secondary | ICD-10-CM | POA: Diagnosis not present

## 2018-08-28 DIAGNOSIS — R05 Cough: Secondary | ICD-10-CM | POA: Diagnosis not present

## 2018-08-31 DIAGNOSIS — J449 Chronic obstructive pulmonary disease, unspecified: Secondary | ICD-10-CM | POA: Diagnosis not present

## 2018-08-31 DIAGNOSIS — R05 Cough: Secondary | ICD-10-CM | POA: Diagnosis not present

## 2018-09-10 DIAGNOSIS — E538 Deficiency of other specified B group vitamins: Secondary | ICD-10-CM | POA: Diagnosis not present

## 2018-09-10 DIAGNOSIS — G4700 Insomnia, unspecified: Secondary | ICD-10-CM | POA: Diagnosis not present

## 2018-09-10 DIAGNOSIS — E559 Vitamin D deficiency, unspecified: Secondary | ICD-10-CM | POA: Diagnosis not present

## 2018-09-10 DIAGNOSIS — Z79899 Other long term (current) drug therapy: Secondary | ICD-10-CM | POA: Diagnosis not present

## 2018-09-10 DIAGNOSIS — E7800 Pure hypercholesterolemia, unspecified: Secondary | ICD-10-CM | POA: Diagnosis not present

## 2018-09-27 DIAGNOSIS — J449 Chronic obstructive pulmonary disease, unspecified: Secondary | ICD-10-CM | POA: Diagnosis not present

## 2018-09-27 DIAGNOSIS — R05 Cough: Secondary | ICD-10-CM | POA: Diagnosis not present

## 2018-09-30 DIAGNOSIS — R05 Cough: Secondary | ICD-10-CM | POA: Diagnosis not present

## 2018-09-30 DIAGNOSIS — J449 Chronic obstructive pulmonary disease, unspecified: Secondary | ICD-10-CM | POA: Diagnosis not present

## 2018-10-28 DIAGNOSIS — R05 Cough: Secondary | ICD-10-CM | POA: Diagnosis not present

## 2018-10-28 DIAGNOSIS — J449 Chronic obstructive pulmonary disease, unspecified: Secondary | ICD-10-CM | POA: Diagnosis not present

## 2018-10-31 DIAGNOSIS — J449 Chronic obstructive pulmonary disease, unspecified: Secondary | ICD-10-CM | POA: Diagnosis not present

## 2018-10-31 DIAGNOSIS — R05 Cough: Secondary | ICD-10-CM | POA: Diagnosis not present

## 2018-11-03 ENCOUNTER — Telehealth: Payer: Self-pay

## 2018-11-03 ENCOUNTER — Encounter: Payer: Self-pay | Admitting: Pain Medicine

## 2018-11-03 NOTE — Telephone Encounter (Signed)
Called to talke on appt tomorrow.

## 2018-11-04 ENCOUNTER — Encounter: Payer: Medicare Other | Admitting: Nurse Practitioner

## 2018-11-04 ENCOUNTER — Ambulatory Visit: Payer: Medicare Other | Attending: Nurse Practitioner | Admitting: Pain Medicine

## 2018-11-04 ENCOUNTER — Other Ambulatory Visit: Payer: Self-pay

## 2018-11-04 DIAGNOSIS — M79605 Pain in left leg: Secondary | ICD-10-CM | POA: Diagnosis not present

## 2018-11-04 DIAGNOSIS — G8929 Other chronic pain: Secondary | ICD-10-CM

## 2018-11-04 DIAGNOSIS — M545 Low back pain, unspecified: Secondary | ICD-10-CM

## 2018-11-04 DIAGNOSIS — M7918 Myalgia, other site: Secondary | ICD-10-CM

## 2018-11-04 DIAGNOSIS — M47816 Spondylosis without myelopathy or radiculopathy, lumbar region: Secondary | ICD-10-CM

## 2018-11-04 DIAGNOSIS — G4701 Insomnia due to medical condition: Secondary | ICD-10-CM

## 2018-11-04 DIAGNOSIS — M25551 Pain in right hip: Secondary | ICD-10-CM

## 2018-11-04 DIAGNOSIS — M25552 Pain in left hip: Secondary | ICD-10-CM

## 2018-11-04 DIAGNOSIS — R252 Cramp and spasm: Secondary | ICD-10-CM

## 2018-11-04 DIAGNOSIS — M961 Postlaminectomy syndrome, not elsewhere classified: Secondary | ICD-10-CM | POA: Diagnosis not present

## 2018-11-04 DIAGNOSIS — G894 Chronic pain syndrome: Secondary | ICD-10-CM

## 2018-11-04 DIAGNOSIS — M792 Neuralgia and neuritis, unspecified: Secondary | ICD-10-CM

## 2018-11-04 MED ORDER — GABAPENTIN 300 MG PO CAPS: 300.0000 mg | ORAL_CAPSULE | Freq: Three times a day (TID) | ORAL | 2 refills | Status: DC

## 2018-11-04 MED ORDER — TRAZODONE HCL 100 MG PO TABS: 100.0000 mg | ORAL_TABLET | Freq: Every day | ORAL | 0 refills | Status: DC

## 2018-11-04 MED ORDER — CYCLOBENZAPRINE HCL 10 MG PO TABS: 10.0000 mg | ORAL_TABLET | Freq: Three times a day (TID) | ORAL | 2 refills | Status: DC | PRN

## 2018-11-04 MED ORDER — OXYCODONE HCL 5 MG PO TABS: 5.0000 mg | ORAL_TABLET | Freq: Three times a day (TID) | ORAL | 0 refills | Status: DC | PRN

## 2018-11-04 NOTE — Patient Instructions (Addendum)
____________________________________________________________________________________________  Pain Prevention Technique  Definition:   A technique used to minimize the effects of an activity known to cause inflammation or swelling, which in turn leads to an increase in pain.  Purpose: To prevent swelling from occurring. It is based on the fact that it is easier to prevent swelling from happening than it is to get rid of it, once it occurs.  Contraindications: 1. Anyone with allergy or hypersensitivity to the recommended medications. 2. Anyone taking anticoagulants (Blood Thinners) (e.g., Coumadin, Warfarin, Plavix, etc.). 3. Patients in Renal Failure.  Technique: Before you undertake an activity known to cause pain, or a flare-up of your chronic pain, and before you experience any pain, do the following:  1. On a full stomach, take 4 (four) over the counter Ibuprofens 200mg  tablets (Motrin), for a total of 800 mg. 2. In addition, take over the counter Magnesium 400 to 500 mg, before doing the activity.  3. Six (6) hours later, again on a full stomach, repeat the Ibuprofen. 4. That night, take a warm shower and stretch under the running warm water.  This technique may be sufficient to abort the pain and discomfort before it happens. Keep in mind that it takes a lot less medication to prevent swelling than it takes to eliminate it once it occurs.  ____________________________________________________________________________________________  ____________________________________________________________________________________________  Drug Holidays (Slow)  What is a "Drug Holiday"? Drug Holiday: is the name given to the period of time during which a patient stops taking a medication(s) for the purpose of eliminating tolerance to the drug.  Benefits . Improved effectiveness of opioids. . Decreased opioid dose needed to achieve benefits. . Improved pain with lesser dose.  What is  tolerance? Tolerance: is the progressive decreased in effectiveness of a drug due to its repetitive use. With repetitive use, the body gets use to the medication and as a consequence, it loses its effectiveness. This is a common problem seen with opioid pain medications. As a result, a larger dose of the drug is needed to achieve the same effect that used to be obtained with a smaller dose.  How long should a "Drug Holiday" last? You should stay off of the pain medicine for at least 14 consecutive days. (2 weeks)  Should I stop the medicine "cold [place]"? No. You should always coordinate with your Pain Specialist so that he/she can provide you with the correct medication dose to make the transition as smoothly as possible.  How do I stop the medicine? Slowly. You will be instructed to decrease the daily amount of pills that you take by one (1) pill every seven (7) days. This is called a "slow downward taper" of your dose. For example: if you normally take four (4) pills per day, you will be asked to drop this dose to three (3) pills per day for seven (7) days, then to two (2) pills per day for seven (7) days, then to one (1) per day for seven (7) days, and at the end of those last seven (7) days, this is when the "Drug Holiday" would start.   Will I have withdrawals? By doing a "slow downward taper" like this one, it is unlikely that you will experience any significant withdrawal symptoms. Typically, what triggers withdrawals is the sudden stop of a high dose opioid therapy. Withdrawals can usually be avoided by slowly decreasing the dose over a prolonged period of time.  What are withdrawals? Withdrawals: refers to the wide range of symptoms that occur after  stopping or dramatically reducing opiate drugs after heavy and prolonged use. Withdrawal symptoms do not occur to patients that use low dose opioids, or those who take the medication sporadically. Contrary to benzodiazepine (example: Valium,  Xanax, etc.) or alcohol withdrawals ("Delirium Tremens"), opioid withdrawals are not lethal. Withdrawals are the physical manifestation of the body getting rid of the excess receptors.  Expected Symptoms Early symptoms of withdrawal may include: . Agitation . Anxiety . Muscle aches . Increased tearing . Insomnia . Runny nose . Sweating . Yawning  Late symptoms of withdrawal may include: . Abdominal cramping . Diarrhea . Dilated pupils . Goose bumps . Nausea . Vomiting  Will I experience withdrawals? Due to the slow nature of the taper, it is very unlikely that you will experience any.  What is a slow taper? Taper: refers to the gradual decrease in dose.  ___________________________________________________________________________________________   ____________________________________________________________________________________________  Medication Rules  Purpose: To inform patients, and their family members, of our rules and regulations.  Applies to: All patients receiving prescriptions (written or electronic).  Pharmacy of record: Pharmacy where electronic prescriptions will be sent. If written prescriptions are taken to a different pharmacy, please inform the nursing staff. The pharmacy listed in the electronic medical record should be the one where you would like electronic prescriptions to be sent.  Electronic prescriptions: In compliance with the [place] (STOP) Act of 2017 (Session Lanny Cramp [id number]), effective May 06, 2018, all controlled substances must be electronically prescribed. Calling prescriptions to the pharmacy will cease to exist.  Prescription refills: Only during scheduled appointments. Applies to all prescriptions.  NOTE: The following applies primarily to controlled substances (Opioid* Pain Medications).   Patient's responsibilities: 1. Pain Pills: Bring all pain pills to every appointment (except for  procedure appointments). 2. Pill Bottles: Bring pills in original pharmacy bottle. Always bring the newest bottle. Bring bottle, even if empty. 3. Medication refills: You are responsible for knowing and keeping track of what medications you take and those you need refilled. The day before your appointment: write a list of all prescriptions that need to be refilled. The day of the appointment: give the list to the admitting nurse. Prescriptions will be written only during appointments. No prescriptions will be written on procedure days. If you forget a medication: it will not be "Called in", "Faxed", or "electronically sent". You will need to get another appointment to get these prescribed. No early refills. Do not call asking to have your prescription filled early. 4. Prescription Accuracy: You are responsible for carefully inspecting your prescriptions before leaving our office. Have the discharge nurse carefully go over each prescription with you, before taking them home. Make sure that your name is accurately spelled, that your address is correct. Check the name and dose of your medication to make sure it is accurate. Check the number of pills, and the written instructions to make sure they are clear and accurate. Make sure that you are given enough medication to last until your next medication refill appointment. 5. Taking Medication: Take medication as prescribed. When it comes to controlled substances, taking less pills or less frequently than prescribed is permitted and encouraged. Never take more pills than instructed. Never take medication more frequently than prescribed.  6. Inform other Doctors: Always inform, all of your healthcare providers, of all the medications you take. 7. Pain Medication from other Providers: You are not allowed to accept any additional pain medication from any other Doctor or Healthcare provider. There  are two exceptions to this rule. (see below) In the event that you  require additional pain medication, you are responsible for notifying us, as stated below. 8. Medication Agreement: You are responsible for carefully reading and following our Medication Agreement. This must be signed before receiving any prescriptions from our practice. Safely store a copy of your signed Agreement. Violations to the Agreement will result in no further prescriptions. (Additional copies of our Medication Agreement are available upon request.) 9. Laws, Rules, & Regulations: All patients are expected to follow all Federal and [employer], Rules, [employer]. Ignorance of the Laws does not constitute a valid excuse. The use of any illegal substances is prohibited. 10. Adopted CDC guidelines & recommendations: Target dosing levels will be at or below 60 MME/day. Use of benzodiazepines** is not recommended.  Exceptions: There are only two exceptions to the rule of not receiving pain medications from other Healthcare Providers. 1. Exception #1 (Emergencies): In the event of an emergency (i.e.: accident requiring emergency care), you are allowed to receive additional pain medication. However, you are responsible for: As soon as you are able, call our office [phone], at any time of the day or night, and leave a message stating your name, the date and nature of the emergency, and the name and dose of the medication prescribed. In the event that your call is answered by a member of our staff, make sure to document and save the date, time, and the name of the person that took your information.  2. Exception #2 (Planned Surgery): In the event that you are scheduled by another doctor or dentist to have any type of surgery or procedure, you are allowed (for a period no longer than 30 days), to receive additional pain medication, for the acute post-op pain. However, in this case, you are responsible for picking up a copy of our "Post-op Pain Management for Surgeons" handout, and giving it  to your surgeon or dentist. This document is available at our office, and does not require an appointment to obtain it. Simply go to our office during business hours (Monday-Thursday from 8:00 AM to 4:00 PM) (Friday 8:00 AM to 12:00 Noon) or if you have a scheduled appointment with [place], prior to your surgery, and ask for it by name. In addition, you will need to provide [place] with your name, name of your surgeon, type of surgery, and date of procedure or surgery.  *Opioid medications include: morphine, codeine, oxycodone, oxymorphone, hydrocodone, hydromorphone, meperidine, tramadol, tapentadol, buprenorphine, fentanyl, methadone. **Benzodiazepine medications include: diazepam (Valium), alprazolam (Xanax), clonazepam (Klonopine), lorazepam (Ativan), clorazepate (Tranxene), chlordiazepoxide (Librium), estazolam (Prosom), oxazepam (Serax), temazepam (Restoril), triazolam (Halcion) (Last updated: 07/03/2017) ____________________________________________________________________________________________   ____________________________________________________________________________________________  Medication Recommendations and Reminders  Applies to: All patients receiving prescriptions (written and/or electronic).  Medication Rules & Regulations: These rules and regulations exist for your safety and that of others. They are not flexible and neither are we. Dismissing or ignoring them will be considered "non-compliance" with medication therapy, resulting in complete and irreversible termination of such therapy. (See document titled "Medication Rules" for more details.) In all conscience, because of safety reasons, we cannot continue providing a therapy where the patient does not follow instructions.  Pharmacy of record:   Definition: This is the pharmacy where your electronic prescriptions will be sent.   We do not endorse any particular pharmacy.  You are not restricted in your choice of pharmacy.  The  pharmacy listed in the electronic medical record should  be the one where you want electronic prescriptions to be sent.  If you choose to change pharmacy, simply notify our nursing staff of your choice of new pharmacy.  Recommendations:  Keep all of your pain medications in a safe place, under lock and key, even if you live alone.   After you fill your prescription, take 1 week's worth of pills and put them away in a safe place. You should keep a separate, properly labeled bottle for this purpose. The remainder should be kept in the original bottle. Use this as your primary supply, until it runs out. Once it's gone, then you know that you have 1 week's worth of medicine, and it is time to come in for a prescription refill. If you do this correctly, it is unlikely that you will ever run out of medicine.  To make sure that the above recommendation works, it is very important that you make sure your medication refill appointments are scheduled at least 1 week before you run out of medicine. To do this in an effective manner, make sure that you do not leave the office without scheduling your next medication management appointment. Always ask the nursing staff to show you in your prescription , when your medication will be running out. Then arrange for the receptionist to get you a return appointment, at least 7 days before you run out of medicine. Do not wait until you have 1 or 2 pills left, to come in. This is very poor planning and does not take into consideration that we may need to cancel appointments due to bad weather, sickness, or emergencies affecting our staff.  "Partial Fill": If for any reason your pharmacy does not have enough pills/tablets to completely fill or refill your prescription, do not allow for a "partial fill". You will need a separate prescription to fill the remaining amount, which we will not provide. If the reason for the partial fill is your insurance, you will need to talk to the  pharmacist about payment alternatives for the remaining tablets, but again, do not accept a partial fill.  Prescription refills and/or changes in medication(s):   Prescription refills, and/or changes in dose or medication, will be conducted only during scheduled medication management appointments. (Applies to both, written and electronic prescriptions.)  No refills on procedure days. No medication will be changed or started on procedure days. No changes, adjustments, and/or refills will be conducted on a procedure day. Doing so will interfere with the diagnostic portion of the procedure.  No phone refills. No medications will be "called into the pharmacy".  No Fax refills.  No weekend refills.  No Holliday refills.  No after hours refills.  Remember:  Business hours are:  Monday to Thursday 8:00 AM to 4:00 PM Provider's Schedule: Dionisio David, NP - Appointments are:  Medication management: Monday to Thursday 8:00 AM to 4:00 PM Milinda Pointer, MD - Appointments are:  Medication management: Monday and Wednesday 8:00 AM to 4:00 PM Procedure day: Tuesday and Thursday 7:30 AM to 4:00 PM Gillis Santa, MD - Appointments are:  Medication management: Tuesday and Thursday 8:00 AM to 4:00 PM Procedure day: Monday and Wednesday 7:30 AM to 4:00 PM (Last update: 07/03/2017) ____________________________________________________________________________________________
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
## 2018-11-27 DIAGNOSIS — R05 Cough: Secondary | ICD-10-CM | POA: Diagnosis not present

## 2018-11-27 DIAGNOSIS — J449 Chronic obstructive pulmonary disease, unspecified: Secondary | ICD-10-CM | POA: Diagnosis not present

## 2018-11-30 DIAGNOSIS — J449 Chronic obstructive pulmonary disease, unspecified: Secondary | ICD-10-CM | POA: Diagnosis not present

## 2018-11-30 DIAGNOSIS — R05 Cough: Secondary | ICD-10-CM | POA: Diagnosis not present

## 2018-12-14 DIAGNOSIS — I493 Ventricular premature depolarization: Secondary | ICD-10-CM | POA: Diagnosis not present

## 2018-12-14 DIAGNOSIS — I361 Nonrheumatic tricuspid (valve) insufficiency: Secondary | ICD-10-CM | POA: Diagnosis not present

## 2018-12-14 DIAGNOSIS — I340 Nonrheumatic mitral (valve) insufficiency: Secondary | ICD-10-CM | POA: Diagnosis not present

## 2018-12-14 DIAGNOSIS — I6523 Occlusion and stenosis of bilateral carotid arteries: Secondary | ICD-10-CM | POA: Diagnosis not present

## 2018-12-14 DIAGNOSIS — R011 Cardiac murmur, unspecified: Secondary | ICD-10-CM | POA: Diagnosis not present

## 2018-12-14 DIAGNOSIS — E538 Deficiency of other specified B group vitamins: Secondary | ICD-10-CM | POA: Diagnosis not present

## 2018-12-22 ENCOUNTER — Other Ambulatory Visit: Payer: Self-pay | Admitting: Family Medicine

## 2018-12-22 DIAGNOSIS — N6310 Unspecified lump in the right breast, unspecified quadrant: Secondary | ICD-10-CM

## 2018-12-28 DIAGNOSIS — J449 Chronic obstructive pulmonary disease, unspecified: Secondary | ICD-10-CM | POA: Diagnosis not present

## 2018-12-28 DIAGNOSIS — R05 Cough: Secondary | ICD-10-CM | POA: Diagnosis not present

## 2018-12-31 DIAGNOSIS — E7800 Pure hypercholesterolemia, unspecified: Secondary | ICD-10-CM | POA: Diagnosis not present

## 2018-12-31 DIAGNOSIS — I493 Ventricular premature depolarization: Secondary | ICD-10-CM | POA: Diagnosis not present

## 2018-12-31 DIAGNOSIS — Z87891 Personal history of nicotine dependence: Secondary | ICD-10-CM | POA: Diagnosis not present

## 2018-12-31 DIAGNOSIS — I951 Orthostatic hypotension: Secondary | ICD-10-CM | POA: Diagnosis not present

## 2018-12-31 DIAGNOSIS — R05 Cough: Secondary | ICD-10-CM | POA: Diagnosis not present

## 2018-12-31 DIAGNOSIS — R0602 Shortness of breath: Secondary | ICD-10-CM | POA: Diagnosis not present

## 2018-12-31 DIAGNOSIS — J449 Chronic obstructive pulmonary disease, unspecified: Secondary | ICD-10-CM | POA: Diagnosis not present

## 2019-01-03 DIAGNOSIS — I951 Orthostatic hypotension: Secondary | ICD-10-CM | POA: Insufficient documentation

## 2019-01-06 ENCOUNTER — Other Ambulatory Visit: Payer: Medicare Other

## 2019-01-18 DIAGNOSIS — G603 Idiopathic progressive neuropathy: Secondary | ICD-10-CM | POA: Diagnosis not present

## 2019-01-18 DIAGNOSIS — M79661 Pain in right lower leg: Secondary | ICD-10-CM | POA: Diagnosis not present

## 2019-01-21 DIAGNOSIS — S63602A Unspecified sprain of left thumb, initial encounter: Secondary | ICD-10-CM | POA: Diagnosis not present

## 2019-01-21 DIAGNOSIS — S60222A Contusion of left hand, initial encounter: Secondary | ICD-10-CM | POA: Diagnosis not present

## 2019-01-21 DIAGNOSIS — E539 Vitamin B deficiency, unspecified: Secondary | ICD-10-CM | POA: Diagnosis not present

## 2019-01-21 DIAGNOSIS — Z636 Dependent relative needing care at home: Secondary | ICD-10-CM | POA: Diagnosis not present

## 2019-01-26 DIAGNOSIS — S63602A Unspecified sprain of left thumb, initial encounter: Secondary | ICD-10-CM | POA: Diagnosis not present

## 2019-01-28 ENCOUNTER — Other Ambulatory Visit: Payer: Self-pay | Admitting: Pain Medicine

## 2019-01-28 DIAGNOSIS — G8929 Other chronic pain: Secondary | ICD-10-CM

## 2019-01-28 DIAGNOSIS — R05 Cough: Secondary | ICD-10-CM | POA: Diagnosis not present

## 2019-01-28 DIAGNOSIS — J449 Chronic obstructive pulmonary disease, unspecified: Secondary | ICD-10-CM | POA: Diagnosis not present

## 2019-01-31 DIAGNOSIS — R05 Cough: Secondary | ICD-10-CM | POA: Diagnosis not present

## 2019-01-31 DIAGNOSIS — J449 Chronic obstructive pulmonary disease, unspecified: Secondary | ICD-10-CM | POA: Diagnosis not present

## 2019-02-01 ENCOUNTER — Other Ambulatory Visit: Payer: Self-pay | Admitting: Pain Medicine

## 2019-02-01 DIAGNOSIS — M792 Neuralgia and neuritis, unspecified: Secondary | ICD-10-CM

## 2019-02-01 DIAGNOSIS — G4701 Insomnia due to medical condition: Secondary | ICD-10-CM

## 2019-02-04 ENCOUNTER — Encounter: Payer: Self-pay | Admitting: Pain Medicine

## 2019-02-08 ENCOUNTER — Ambulatory Visit: Payer: Medicare Other | Attending: Pain Medicine | Admitting: Pain Medicine

## 2019-02-08 ENCOUNTER — Other Ambulatory Visit: Payer: Self-pay

## 2019-02-08 ENCOUNTER — Telehealth: Payer: Self-pay | Admitting: *Deleted

## 2019-02-08 DIAGNOSIS — M545 Low back pain: Secondary | ICD-10-CM

## 2019-02-08 DIAGNOSIS — M899 Disorder of bone, unspecified: Secondary | ICD-10-CM

## 2019-02-08 DIAGNOSIS — G894 Chronic pain syndrome: Secondary | ICD-10-CM | POA: Diagnosis not present

## 2019-02-08 DIAGNOSIS — Z79899 Other long term (current) drug therapy: Secondary | ICD-10-CM

## 2019-02-08 DIAGNOSIS — G8929 Other chronic pain: Secondary | ICD-10-CM

## 2019-02-08 DIAGNOSIS — M792 Neuralgia and neuritis, unspecified: Secondary | ICD-10-CM

## 2019-02-08 DIAGNOSIS — M79605 Pain in left leg: Secondary | ICD-10-CM

## 2019-02-08 DIAGNOSIS — M25552 Pain in left hip: Secondary | ICD-10-CM

## 2019-02-08 DIAGNOSIS — Z789 Other specified health status: Secondary | ICD-10-CM

## 2019-02-08 DIAGNOSIS — M961 Postlaminectomy syndrome, not elsewhere classified: Secondary | ICD-10-CM | POA: Diagnosis not present

## 2019-02-08 DIAGNOSIS — M47816 Spondylosis without myelopathy or radiculopathy, lumbar region: Secondary | ICD-10-CM | POA: Diagnosis not present

## 2019-02-08 DIAGNOSIS — M25551 Pain in right hip: Secondary | ICD-10-CM | POA: Diagnosis not present

## 2019-02-08 DIAGNOSIS — R252 Cramp and spasm: Secondary | ICD-10-CM

## 2019-02-08 DIAGNOSIS — G4701 Insomnia due to medical condition: Secondary | ICD-10-CM

## 2019-02-08 DIAGNOSIS — M7918 Myalgia, other site: Secondary | ICD-10-CM

## 2019-02-08 MED ORDER — GABAPENTIN 300 MG PO CAPS: 300.0000 mg | ORAL_CAPSULE | Freq: Three times a day (TID) | ORAL | 2 refills | Status: DC

## 2019-02-08 MED ORDER — OXYCODONE HCL 5 MG PO TABS: 5.0000 mg | ORAL_TABLET | Freq: Three times a day (TID) | ORAL | 0 refills | Status: DC | PRN

## 2019-02-08 MED ORDER — CYCLOBENZAPRINE HCL 10 MG PO TABS: 10.0000 mg | ORAL_TABLET | Freq: Three times a day (TID) | ORAL | 2 refills | Status: DC | PRN

## 2019-02-08 MED ORDER — TRAZODONE HCL 100 MG PO TABS: 100.0000 mg | ORAL_TABLET | Freq: Every day | ORAL | 0 refills | Status: DC

## 2019-02-08 NOTE — Patient Instructions (Addendum)
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
## 2019-02-11 DIAGNOSIS — G903 Multi-system degeneration of the autonomic nervous system: Secondary | ICD-10-CM | POA: Diagnosis not present

## 2019-02-11 DIAGNOSIS — E7800 Pure hypercholesterolemia, unspecified: Secondary | ICD-10-CM | POA: Diagnosis not present

## 2019-02-11 DIAGNOSIS — R0602 Shortness of breath: Secondary | ICD-10-CM | POA: Diagnosis not present

## 2019-02-11 DIAGNOSIS — I493 Ventricular premature depolarization: Secondary | ICD-10-CM | POA: Diagnosis not present

## 2019-02-11 DIAGNOSIS — I951 Orthostatic hypotension: Secondary | ICD-10-CM | POA: Diagnosis not present

## 2019-02-23 ENCOUNTER — Ambulatory Visit (HOSPITAL_BASED_OUTPATIENT_CLINIC_OR_DEPARTMENT_OTHER): Payer: Medicare Other | Admitting: Pain Medicine

## 2019-02-23 ENCOUNTER — Ambulatory Visit
Admission: RE | Admit: 2019-02-23 | Discharge: 2019-02-23 | Disposition: A | Discharge status: Home / Self Care | Payer: Medicare Other | Source: Ambulatory Visit | Attending: Pain Medicine | Admitting: Pain Medicine

## 2019-02-23 ENCOUNTER — Encounter: Payer: Self-pay | Admitting: Pain Medicine

## 2019-02-23 ENCOUNTER — Other Ambulatory Visit: Payer: Self-pay

## 2019-02-23 VITALS — BP 117/68 | HR 88 | Temp 98.1°F | Resp 14 | Ht 64.0 in | Wt 128.0 lb

## 2019-02-23 DIAGNOSIS — M5137 Other intervertebral disc degeneration, lumbosacral region: Secondary | ICD-10-CM

## 2019-02-23 DIAGNOSIS — G8929 Other chronic pain: Secondary | ICD-10-CM | POA: Diagnosis not present

## 2019-02-23 DIAGNOSIS — M961 Postlaminectomy syndrome, not elsewhere classified: Secondary | ICD-10-CM | POA: Diagnosis not present

## 2019-02-23 DIAGNOSIS — M545 Low back pain: Secondary | ICD-10-CM | POA: Diagnosis not present

## 2019-02-23 DIAGNOSIS — M47816 Spondylosis without myelopathy or radiculopathy, lumbar region: Secondary | ICD-10-CM | POA: Diagnosis not present

## 2019-02-23 DIAGNOSIS — M47817 Spondylosis without myelopathy or radiculopathy, lumbosacral region: Secondary | ICD-10-CM | POA: Insufficient documentation

## 2019-02-23 MED ORDER — ROPIVACAINE HCL 2 MG/ML IJ SOLN
18.0000 mL | Freq: Once | INTRAMUSCULAR | Status: AC
  Administered 2019-02-23: 09:00:00 18 mL via PERINEURAL
  Filled 2019-02-23: qty 20

## 2019-02-23 MED ORDER — MIDAZOLAM HCL 5 MG/5ML IJ SOLN
1.0000 mg | INTRAMUSCULAR | Status: DC | PRN
  Administered 2019-02-23: 08:00:00 2 mg via INTRAVENOUS
  Filled 2019-02-23: qty 5

## 2019-02-23 MED ORDER — TRIAMCINOLONE ACETONIDE 40 MG/ML IJ SUSP
80.0000 mg | Freq: Once | INTRAMUSCULAR | Status: AC
  Administered 2019-02-23: 80 mg
  Filled 2019-02-23: qty 2

## 2019-02-23 MED ORDER — LIDOCAINE HCL 2 % IJ SOLN
20.0000 mL | Freq: Once | INTRAMUSCULAR | Status: AC
  Administered 2019-02-23: 09:00:00 400 mg
  Filled 2019-02-23: qty 20

## 2019-02-23 MED ORDER — FENTANYL CITRATE (PF) 100 MCG/2ML IJ SOLN
25.0000 ug | INTRAMUSCULAR | Status: DC | PRN
  Administered 2019-02-23: 08:00:00 50 ug via INTRAVENOUS
  Filled 2019-02-23: qty 2

## 2019-02-23 MED ORDER — LACTATED RINGERS IV SOLN
1000.0000 mL | Freq: Once | INTRAVENOUS | Status: AC
  Administered 2019-02-23: 09:00:00 1000 mL via INTRAVENOUS

## 2019-02-23 NOTE — Progress Notes (Signed)
Safety precautions to be maintained throughout the outpatient stay will include: orient to surroundings, keep bed in low position, maintain call bell within reach at all times, provide assistance with transfer out of bed and ambulation.  

## 2019-02-23 NOTE — Patient Instructions (Addendum)

## 2019-02-23 NOTE — Progress Notes (Signed)
Patient's Name: Paula Duncan  MRN: [id number]  Referring Provider: [name], FNP  DOB: 08-17-1960  PCP: [name], FNP  DOS: 02/23/2019  Note by: Gaspar Cola, MD  Service setting: Ambulatory outpatient  Specialty: Interventional Pain Management  Patient type: Established  Location: [facility] (AMB) Pain Management Facility  Visit type: Interventional Procedure   Primary Reason for Visit: Interventional Pain Management Treatment. CC: Back Pain (lower)  Procedure:          Anesthesia, Analgesia, Anxiolysis:  Type: Lumbar Facet, Medial Branch Block(s)          Primary Purpose: Diagnostic Region: Posterolateral Lumbosacral Spine Level: L2, L3, L4, L5, & S1 Medial Branch Level(s). Injecting these levels blocks the L3-4, L4-5, and L5-S1 lumbar facet joints. Laterality: Bilateral  Type: Moderate (Conscious) Sedation combined with Local Anesthesia Indication(s): Analgesia and Anxiety Route: Intravenous (IV) IV Access: Secured Sedation: Meaningful verbal contact was maintained at all times during the procedure  Local Anesthetic: Lidocaine 1-2%  Position: Prone   Indications: 1. Lumbar facet syndrome (Bilateral) (R>L)   2. Spondylosis without myelopathy or radiculopathy, lumbosacral region   3. DDD (degenerative disc disease), lumbosacral   4. Failed back surgical syndrome   5. Chronic low back pain (Primary Area of Pain) (Bilateral) (R>L)    Pain Score: Pre-procedure: 6 /10 Post-procedure: 4 /10   Pre-op Assessment:  Paula Duncan is a 58 y.o. (year old), female patient, seen today for interventional treatment. She  has a past surgical history that includes Spine surgery (1994); Abdominal hysterectomy; Exploratory laparotomy [id number]); Cesarean section; Esophagogastroduodenoscopy (07/12/09); left heart catheterization with coronary angiogram (N/A, 04/06/2013); Ventral hernia repair (N/A, 12/26/2017); and Hernia repair. Paula Duncan has a current medication list which includes the  following prescription(s): alendronate, b complex-c, biotin w/ vitamins c & e, vitamin d3, cyclobenzaprine, gabapentin, guaifenesin-codeine, ipratropium, levalbuterol, oxycodone, oxycodone, oxycodone, potassium chloride sa, pravastatin, flutter, aerochamber mv, tiotropium bromide monohydrate, trazodone, zolpidem, and levalbuterol, and the following Facility-Administered Medications: fentanyl and midazolam. Her primarily concern today is the Back Pain (lower)  Initial Vital Signs:  Pulse/HCG Rate: (!) 103ECG Heart Rate: 91 Temp: 98.1 F (36.7 C) Resp: 16 BP: 138/61 SpO2: 100 %  BMI: Estimated body mass index is 21.97 kg/m as calculated from the following:   Height as of this encounter: 5\' 4"  (1.626 m).   Weight as of this encounter: 128 lb (58.1 kg).  Risk Assessment: Allergies: Reviewed. She is allergic to nsaids; tramadol hcl; chantix [varenicline]; and metronidazole.  Allergy Precautions: None required Coagulopathies: Reviewed. None identified.  Blood-thinner therapy: None at this time Active Infection(s): Reviewed. None identified. Paula Duncan is afebrile  Site Confirmation: Paula Duncan was asked to confirm the procedure and laterality before marking the site Procedure checklist: Completed Consent: Before the procedure and under the influence of no sedative(s), amnesic(s), or anxiolytics, the patient was informed of the treatment options, risks and possible complications. To fulfill our ethical and legal obligations, as recommended by the American Medical Association's Code of Ethics, I have informed the patient of my clinical impression; the nature and purpose of the treatment or procedure; the risks, benefits, and possible complications of the intervention; the alternatives, including doing nothing; the risk(s) and benefit(s) of the alternative treatment(s) or procedure(s); and the risk(s) and benefit(s) of doing nothing. The patient was provided information about the general risks and  possible complications associated with the procedure. These may include, but are not limited to: failure to achieve desired goals, infection, bleeding, organ or nerve  damage, allergic reactions, paralysis, and death. In addition, the patient was informed of those risks and complications associated to Spine-related procedures, such as failure to decrease pain; infection (i.e.: Meningitis, epidural or intraspinal abscess); bleeding (i.e.: epidural hematoma, subarachnoid hemorrhage, or any other type of intraspinal or peri-dural bleeding); organ or nerve damage (i.e.: Any type of peripheral nerve, nerve root, or spinal cord injury) with subsequent damage to sensory, motor, and/or autonomic systems, resulting in permanent pain, numbness, and/or weakness of one or several areas of the body; allergic reactions; (i.e.: anaphylactic reaction); and/or death. Furthermore, the patient was informed of those risks and complications associated with the medications. These include, but are not limited to: allergic reactions (i.e.: anaphylactic or anaphylactoid reaction(s)); adrenal axis suppression; blood sugar elevation that in diabetics may result in ketoacidosis or comma; water retention that in patients with history of congestive heart failure may result in shortness of breath, pulmonary edema, and decompensation with resultant heart failure; weight gain; swelling or edema; medication-induced neural toxicity; particulate matter embolism and blood vessel occlusion with resultant organ, and/or nervous system infarction; and/or aseptic necrosis of one or more joints. Finally, the patient was informed that Medicine is not an exact science; therefore, there is also the possibility of unforeseen or unpredictable risks and/or possible complications that may result in a catastrophic outcome. The patient indicated having understood very clearly. We have given the patient no guarantees and we have made no promises. Enough time was  given to the patient to ask questions, all of which were answered to the patient's satisfaction. Paula Duncan has indicated that she wanted to continue with the procedure. Attestation: I, the ordering provider, attest that I have discussed with the patient the benefits, risks, side-effects, alternatives, likelihood of achieving goals, and potential problems during recovery for the procedure that I have provided informed consent. Date   Time: 02/23/2019  8:03 AM  Pre-Procedure Preparation:  Monitoring: As per clinic protocol. Respiration, ETCO2, SpO2, BP, heart rate and rhythm monitor placed and checked for adequate function Safety Precautions: Patient was assessed for positional comfort and pressure points before starting the procedure. Time-out: I initiated and conducted the "Time-out" before starting the procedure, as per protocol. The patient was asked to participate by confirming the accuracy of the "Time Out" information. Verification of the correct person, site, and procedure were performed and confirmed by me, the nursing staff, and the patient. "Time-out" conducted as per Joint Commission's Universal Protocol (UP.01.01.01). Time: [id number]  Description of Procedure:          Laterality: Bilateral. The procedure was performed in identical fashion on both sides. Levels:  L2, L3, L4, L5, & S1 Medial Branch Level(s) Area Prepped: Posterior Lumbosacral Region Prepping solution: DuraPrep (Iodine Povacrylex [0.7% available iodine] and Isopropyl Alcohol, 74% w/w) Safety Precautions: Aspiration looking for blood return was conducted prior to all injections. At no point did we inject any substances, as a needle was being advanced. Before injecting, the patient was told to immediately notify me if she was experiencing any new onset of "ringing in the ears, or metallic taste in the mouth". No attempts were made at seeking any paresthesias. Safe injection practices and needle disposal techniques used. Medications  properly checked for expiration dates. SDV (single dose vial) medications used. After the completion of the procedure, all disposable equipment used was discarded in the proper designated medical waste containers. Local Anesthesia: Protocol guidelines were followed. The patient was positioned over the fluoroscopy table. The area was prepped in the usual  manner. The time-out was completed. The target area was identified using fluoroscopy. A 12-in long, straight, sterile hemostat was used with fluoroscopic guidance to locate the targets for each level blocked. Once located, the skin was marked with an approved surgical skin marker. Once all sites were marked, the skin (epidermis, dermis, and hypodermis), as well as deeper tissues (fat, connective tissue and muscle) were infiltrated with a small amount of a short-acting local anesthetic, loaded on a 10cc syringe with a 25G, 1.5-in  Needle. An appropriate amount of time was allowed for local anesthetics to take effect before proceeding to the next step. Local Anesthetic: Lidocaine 2.0% The unused portion of the local anesthetic was discarded in the proper designated containers. Technical explanation of process:  L2 Medial Branch Nerve Block (MBB): The target area for the L2 medial branch is at the junction of the postero-lateral aspect of the superior articular process and the superior, posterior, and medial edge of the transverse process of L3. Under fluoroscopic guidance, a Quincke needle was inserted until contact was made with os over the superior postero-lateral aspect of the pedicular shadow (target area). After negative aspiration for blood, 0.5 mL of the nerve block solution was injected without difficulty or complication. The needle was removed intact. L3 Medial Branch Nerve Block (MBB): The target area for the L3 medial branch is at the junction of the postero-lateral aspect of the superior articular process and the superior, posterior, and medial edge of  the transverse process of L4. Under fluoroscopic guidance, a Quincke needle was inserted until contact was made with os over the superior postero-lateral aspect of the pedicular shadow (target area). After negative aspiration for blood, 0.5 mL of the nerve block solution was injected without difficulty or complication. The needle was removed intact. L4 Medial Branch Nerve Block (MBB): The target area for the L4 medial branch is at the junction of the postero-lateral aspect of the superior articular process and the superior, posterior, and medial edge of the transverse process of L5. Under fluoroscopic guidance, a Quincke needle was inserted until contact was made with os over the superior postero-lateral aspect of the pedicular shadow (target area). After negative aspiration for blood, 0.5 mL of the nerve block solution was injected without difficulty or complication. The needle was removed intact. L5 Medial Branch Nerve Block (MBB): The target area for the L5 medial branch is at the junction of the postero-lateral aspect of the superior articular process and the superior, posterior, and medial edge of the sacral ala. Under fluoroscopic guidance, a Quincke needle was inserted until contact was made with os over the superior postero-lateral aspect of the pedicular shadow (target area). After negative aspiration for blood, 0.5 mL of the nerve block solution was injected without difficulty or complication. The needle was removed intact. S1 Medial Branch Nerve Block (MBB): The target area for the S1 medial branch is at the posterior and inferior 6 o'clock position of the L5-S1 facet joint. Under fluoroscopic guidance, the Quincke needle inserted for the L5 MBB was redirected until contact was made with os over the inferior and postero aspect of the sacrum, at the 6 o' clock position under the L5-S1 facet joint (Target area). After negative aspiration for blood, 0.5 mL of the nerve block solution was injected without  difficulty or complication. The needle was removed intact.  Nerve block solution: 0.2% PF-Ropivacaine + Triamcinolone (40 mg/mL) diluted to a final concentration of 4 mg of Triamcinolone/mL of Ropivacaine The unused portion of the solution  was discarded in the proper designated containers. Procedural Needles: 22-gauge, 3.5-inch, Quincke needles used for all levels.  Once the entire procedure was completed, the treated area was cleaned, making sure to leave some of the prepping solution back to take advantage of its long term bactericidal properties.   Illustration of the posterior view of the lumbar spine and the posterior neural structures. Laminae of L2 through S1 are labeled. DPRL5, dorsal primary ramus of L5; DPRS1, dorsal primary ramus of S1; DPR3, dorsal primary ramus of L3; FJ, facet (zygapophyseal) joint L3-L4; I, inferior articular process of L4; LB1, lateral branch of dorsal primary ramus of L1; IAB, inferior articular branches from L3 medial branch (supplies L4-L5 facet joint); IBP, intermediate branch plexus; MB3, medial branch of dorsal primary ramus of L3; NR3, third lumbar nerve root; S, superior articular process of L5; SAB, superior articular branches from L4 (supplies L4-5 facet joint also); TP3, transverse process of L3.  Vitals:   02/23/19 0852 02/23/19 0856 02/23/19 0906 02/23/19 0916  BP: 117/67 131/73 102/86 117/68  Pulse: 88     Resp: 16 12 12 14   Temp:  98.1 F (36.7 C)    SpO2: 99% 100% 100% 98%  Weight:      Height:         Start Time: 0835 hrs. End Time: 0851 hrs.  Imaging Guidance (Spinal):          Type of Imaging Technique: Fluoroscopy Guidance (Spinal) Indication(s): Assistance in needle guidance and placement for procedures requiring needle placement in or near specific anatomical locations not easily accessible without such assistance. Exposure Time: Please see nurses notes. Contrast: None used. Fluoroscopic Guidance: I was personally present during the  use of fluoroscopy. "Tunnel Vision Technique" used to obtain the best possible view of the target area. Parallax error corrected before commencing the procedure. "Direction-depth-direction" technique used to introduce the needle under continuous pulsed fluoroscopy. Once target was reached, antero-posterior, oblique, and lateral fluoroscopic projection used confirm needle placement in all planes. Images permanently stored in EMR. Interpretation: No contrast injected. I personally interpreted the imaging intraoperatively. Adequate needle placement confirmed in multiple planes. Permanent images saved into the patient's record.  Antibiotic Prophylaxis:   Anti-infectives (From admission, onward)   None     Indication(s): None identified  Post-operative Assessment:  Post-procedure Vital Signs:  Pulse/HCG Rate: 8893 Temp: 98.1 F (36.7 C) Resp: 14 BP: 117/68 SpO2: 98 %  EBL: None  Complications: No immediate post-treatment complications observed by team, or reported by patient.  Note: The patient tolerated the entire procedure well. A repeat set of vitals were taken after the procedure and the patient was kept under observation following institutional policy, for this type of procedure. Post-procedural neurological assessment was performed, showing return to baseline, prior to discharge. The patient was provided with post-procedure discharge instructions, including a section on how to identify potential problems. Should any problems arise concerning this procedure, the patient was given instructions to immediately contact us, at any time, without hesitation. In any case, we plan to contact the patient by telephone for a follow-up status report regarding this interventional procedure.  Comments:  No additional relevant information.  Plan of Care  Orders:  Orders Placed This Encounter  Procedures   LUMBAR FACET(MEDIAL BRANCH NERVE BLOCK) MBNB    Scheduling Instructions:     Side: Bilateral       Level: L3-4, L4-5, & L5-S1 Facets (L2, L3, L4, L5, & S1 Medial Branch Nerves)     Sedation:  With Sedation.     Timeframe: Today    Order Specific Question:   Where will this procedure be performed?    Answer:   [facility]   DG PAIN [facility] C-ARM 1-60 MIN NO REPORT    Intraoperative interpretation by procedural physician at [place].    Standing Status:   Standing    Number of Occurrences:   1    Order Specific Question:   Reason for exam:    Answer:   Assistance in needle guidance and placement for procedures requiring needle placement in or near specific anatomical locations not easily accessible without such assistance.   Informed Consent Details: Physician/Practitioner Attestation; Transcribe to consent form and obtain patient signature    Provider Attestation: I, Berkshire Dossie Arbour, MD, (Pain Management Specialist), the physician/practitioner, attest that I have discussed with the patient the benefits, risks, side effects, alternatives, likelihood of achieving goals and potential problems during recovery for the procedure that I have provided informed consent.    Scheduling Instructions:     Procedure: Lumbar Facet Block  under fluoroscopic guidance     Indication(s): Low Back Pain, with our without leg pain, due to Facet Joint Arthralgia (Joint Pain) known as Lumbar Facet Syndrome, secondary to Lumbar, and/or Lumbosacral Spondylosis (Arthritis of the Spine), without myelopathy or radiculopathy (Nerve Damage).     Note: Always confirm laterality of pain with Ms. Carlton Adam, before procedure.     Transcribe to consent form and obtain patient signature.   Provide equipment / supplies at bedside    Equipment required: Single use, disposable, "Block Tray"    Standing Status:   Standing    Number of Occurrences:   1    Order Specific Question:   Specify    Answer:   Block Tray   Chronic Opioid Analgesic:  Oxycodone 5mg  one three times a day MME/day:15mg /day.    Medications ordered for procedure: Meds ordered this encounter  Medications   lidocaine (XYLOCAINE) 2 % (with pres) injection 400 mg   lactated ringers infusion 1,000 mL   midazolam (VERSED) 5 MG/5ML injection 1-2 mg    Make sure Flumazenil is available in the pyxis when using this medication. If oversedation occurs, administer 0.2 mg IV over 15 sec. If after 45 sec no response, administer 0.2 mg again over 1 min; may repeat at 1 min intervals; not to exceed 4 doses (1 mg)   fentaNYL (SUBLIMAZE) injection 25-50 mcg    Make sure Narcan is available in the pyxis when using this medication. In the event of respiratory depression (RR< 8/min): Titrate NARCAN (naloxone) in increments of 0.1 to 0.2 mg IV at 2-3 minute intervals, until desired degree of reversal.   ropivacaine (PF) 2 mg/mL (0.2%) (NAROPIN) injection 18 mL   triamcinolone acetonide (KENALOG-40) injection 80 mg   Medications administered: We administered lidocaine, lactated ringers, midazolam, fentaNYL, ropivacaine (PF) 2 mg/mL (0.2%), and triamcinolone acetonide.  See the medical record for exact dosing, route, and time of administration.  Follow-up plan:   Return in about 2 weeks (around 03/09/2019) for (VV), (PP).       Considering:   Diagnostic bilateral lumbar facet block Diagnostic bilateral sacroiliac joint block   Palliative PRN treatment(s):   Palliative bilateral lumbar facet block  Diagnostic bilateral sacroiliac joint block  Therapeutic bilateral lumbar facet RFA + bilateral sacroiliac joint RFA(prior bilaterallumbar facet RFA(Left done 10/23/2016, right on 09/09/2016)did not provide her with complete relief of the pain. Follow up diagnostic nerve blocks  confirm that the patient also had bilateral sacroiliac joint pain and therefore the radiofrequency denervation was incomplete.) For now, we will continue treatment with palliative injections and hold on the RFA.     Recent Visits Date Type Provider  Dept  02/08/19 Office Visit Milinda Pointer, MD [facility]  Showing recent visits within past 90 days and meeting all other requirements   Today's Visits Date Type Provider Dept  02/23/19 Procedure visit Milinda Pointer, MD [facility]  Showing today's visits and meeting all other requirements   Future Appointments Date Type Provider Dept  03/10/19 Appointment Milinda Pointer, MD [facility]  05/12/19 Appointment Milinda Pointer, MD [facility]  Showing future appointments within next 90 days and meeting all other requirements   Disposition: Discharge home  Discharge Date & Time: 02/23/2019; 0916 hrs.   Primary Care Physician: [name], FNP Location: [facility] Note by: Gaspar Cola, MD Date: 02/23/2019; Time: 1:14 PM  Disclaimer:  Medicine is not an Chief Strategy Officer. The only guarantee in medicine is that nothing is guaranteed. It is important to note that the decision to proceed with this intervention was based on the information collected from the patient. The Data and conclusions were drawn from the patient's questionnaire, the interview, and the physical examination. Because the information was provided in large part by the patient, it cannot be guaranteed that it has not been purposely or unconsciously manipulated. Every effort has been made to obtain as much relevant data as possible for this evaluation. It is important to note that the conclusions that lead to this procedure are derived in large part from the available data. Always take into account that the treatment will also be dependent on availability of resources and existing treatment guidelines, considered by other Pain Management Practitioners as being common knowledge and practice, at the time of the intervention. For Medico-Legal purposes, it is also important to point out that variation in procedural techniques and  pharmacological choices are the acceptable norm. The indications, contraindications, technique, and results of the above procedure should only be interpreted and judged by a Board-Certified Interventional Pain Specialist with extensive familiarity and expertise in the same exact procedure and technique.

## 2019-02-24 ENCOUNTER — Telehealth: Payer: Self-pay

## 2019-02-24 NOTE — Telephone Encounter (Signed)
Post procedure phone call.  Patient states she is having some aching in her legs.  States pain did not go away completely yesterday.  Informed patient to use heat today, write on pain diary and to call us for any further questions or concerns.

## 2019-03-02 DIAGNOSIS — R05 Cough: Secondary | ICD-10-CM | POA: Diagnosis not present

## 2019-03-02 DIAGNOSIS — J449 Chronic obstructive pulmonary disease, unspecified: Secondary | ICD-10-CM | POA: Diagnosis not present

## 2019-03-09 ENCOUNTER — Encounter: Payer: Self-pay | Admitting: Pain Medicine

## 2019-03-09 NOTE — Progress Notes (Signed)
Pain relief after procedure (treated area only): (Questions asked to patient) 1. About 15 minutes after the procedure, while the area was still numb from the local anesthetics, were you having any pain in the area? First 1 hour: 100 % better. Initial 4-6 hours: 100 % better. 2. How many days was the area numb? Any benefit longer than 6 hours: 100 % better. 3. How much better is your pain now, when compared to before the procedure? Current benefit: patient states the BLF done on 02/23/19 worked well for the first 3 - 4 days and then the pain came back 0 % better. 4. Can you move better now? Improvement in ROM (Range of Motion): No. 5. Can you do more now? Improvement in function: No. 4. Did you have any problems with the procedure? Side-effects/Complications: No.
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
## 2019-03-10 ENCOUNTER — Other Ambulatory Visit: Payer: Self-pay

## 2019-03-10 ENCOUNTER — Ambulatory Visit: Payer: Medicare Other | Attending: Pain Medicine | Admitting: Pain Medicine

## 2019-03-10 DIAGNOSIS — M47817 Spondylosis without myelopathy or radiculopathy, lumbosacral region: Secondary | ICD-10-CM

## 2019-03-10 DIAGNOSIS — M47816 Spondylosis without myelopathy or radiculopathy, lumbar region: Secondary | ICD-10-CM

## 2019-03-10 DIAGNOSIS — M961 Postlaminectomy syndrome, not elsewhere classified: Secondary | ICD-10-CM

## 2019-03-10 DIAGNOSIS — M5137 Other intervertebral disc degeneration, lumbosacral region: Secondary | ICD-10-CM

## 2019-03-17 ENCOUNTER — Other Ambulatory Visit: Payer: Self-pay | Admitting: Pain Medicine

## 2019-03-17 ENCOUNTER — Telehealth: Payer: Self-pay | Admitting: Pain Medicine

## 2019-03-17 DIAGNOSIS — S32030S Wedge compression fracture of third lumbar vertebra, sequela: Secondary | ICD-10-CM

## 2019-03-17 DIAGNOSIS — G8929 Other chronic pain: Secondary | ICD-10-CM

## 2019-03-17 DIAGNOSIS — M5416 Radiculopathy, lumbar region: Secondary | ICD-10-CM

## 2019-03-17 DIAGNOSIS — M48061 Spinal stenosis, lumbar region without neurogenic claudication: Secondary | ICD-10-CM

## 2019-03-17 NOTE — Telephone Encounter (Signed)
Message sent to Dr Naveira 

## 2019-03-17 NOTE — Telephone Encounter (Signed)
Scheduled patient for 03-18-19 at 9:45 and notified her.

## 2019-03-17 NOTE — Telephone Encounter (Signed)
Patient lvmail stating she is hurting too much to wait for RF on 12-3. Wants to know if she can come in for something else before that, asap. Please call to assess and see if Dr. Dossie Arbour can do a different procedure to get her thru till 12-3

## 2019-03-17 NOTE — Telephone Encounter (Signed)
Dr Dossie Arbour put an order in for a LESI.  Please schedule.  I tried to call for pre procedure instructions and I left a message for her to call us back.  It does not appear that she is on blood thinners, but please check with her when you schedule.  NPO for 6 hours if she prefers sedation and she must have a driver.  I will speak to her when you get her on the phone if I am available.  Thank you

## 2019-03-18 ENCOUNTER — Other Ambulatory Visit: Payer: Self-pay

## 2019-03-18 ENCOUNTER — Ambulatory Visit (HOSPITAL_BASED_OUTPATIENT_CLINIC_OR_DEPARTMENT_OTHER): Payer: Medicare Other | Admitting: Pain Medicine

## 2019-03-18 ENCOUNTER — Encounter: Payer: Self-pay | Admitting: Pain Medicine

## 2019-03-18 ENCOUNTER — Ambulatory Visit
Admission: RE | Admit: 2019-03-18 | Discharge: 2019-03-18 | Disposition: A | Discharge status: Home / Self Care | Payer: Medicare Other | Source: Ambulatory Visit | Attending: Pain Medicine | Admitting: Pain Medicine

## 2019-03-18 VITALS — BP 126/72 | HR 112 | Temp 98.6°F | Resp 13 | Ht 64.0 in | Wt 128.0 lb

## 2019-03-18 DIAGNOSIS — M47816 Spondylosis without myelopathy or radiculopathy, lumbar region: Secondary | ICD-10-CM | POA: Insufficient documentation

## 2019-03-18 DIAGNOSIS — M5137 Other intervertebral disc degeneration, lumbosacral region: Secondary | ICD-10-CM | POA: Diagnosis not present

## 2019-03-18 DIAGNOSIS — M5416 Radiculopathy, lumbar region: Secondary | ICD-10-CM

## 2019-03-18 DIAGNOSIS — G8929 Other chronic pain: Secondary | ICD-10-CM | POA: Diagnosis not present

## 2019-03-18 DIAGNOSIS — M961 Postlaminectomy syndrome, not elsewhere classified: Secondary | ICD-10-CM | POA: Insufficient documentation

## 2019-03-18 DIAGNOSIS — M48061 Spinal stenosis, lumbar region without neurogenic claudication: Secondary | ICD-10-CM | POA: Diagnosis not present

## 2019-03-18 DIAGNOSIS — Z87828 Personal history of other (healed) physical injury and trauma: Secondary | ICD-10-CM

## 2019-03-18 DIAGNOSIS — S32030S Wedge compression fracture of third lumbar vertebra, sequela: Secondary | ICD-10-CM | POA: Insufficient documentation

## 2019-03-18 DIAGNOSIS — M545 Low back pain: Secondary | ICD-10-CM | POA: Diagnosis not present

## 2019-03-18 MED ORDER — SODIUM CHLORIDE (PF) 0.9 % IJ SOLN
INTRAMUSCULAR | Status: AC
  Filled 2019-03-18: qty 10

## 2019-03-18 MED ORDER — MIDAZOLAM HCL 5 MG/5ML IJ SOLN
1.0000 mg | INTRAMUSCULAR | Status: DC | PRN
  Administered 2019-03-18: 1 mg via INTRAVENOUS

## 2019-03-18 MED ORDER — DEXAMETHASONE SODIUM PHOSPHATE 10 MG/ML IJ SOLN
INTRAMUSCULAR | Status: AC
  Filled 2019-03-18: qty 1

## 2019-03-18 MED ORDER — LIDOCAINE HCL 2 % IJ SOLN
INTRAMUSCULAR | Status: AC
  Filled 2019-03-18: qty 20

## 2019-03-18 MED ORDER — TRIAMCINOLONE ACETONIDE 40 MG/ML IJ SUSP
40.0000 mg | Freq: Once | INTRAMUSCULAR | Status: AC
  Administered 2019-03-18: 40 mg

## 2019-03-18 MED ORDER — FENTANYL CITRATE (PF) 100 MCG/2ML IJ SOLN
25.0000 ug | INTRAMUSCULAR | Status: DC | PRN
  Administered 2019-03-18: 50 ug via INTRAVENOUS

## 2019-03-18 MED ORDER — ROPIVACAINE HCL 2 MG/ML IJ SOLN
INTRAMUSCULAR | Status: AC
  Filled 2019-03-18: qty 10

## 2019-03-18 MED ORDER — ROPIVACAINE HCL 2 MG/ML IJ SOLN
2.0000 mL | Freq: Once | INTRAMUSCULAR | Status: AC
  Administered 2019-03-18: 2 mL via EPIDURAL

## 2019-03-18 MED ORDER — MIDAZOLAM HCL 5 MG/5ML IJ SOLN
INTRAMUSCULAR | Status: AC
  Filled 2019-03-18: qty 5

## 2019-03-18 MED ORDER — IOHEXOL 180 MG/ML  SOLN
10.0000 mL | Freq: Once | INTRAMUSCULAR | Status: AC
  Administered 2019-03-18: 10 mL via EPIDURAL
  Filled 2019-03-18: qty 20

## 2019-03-18 MED ORDER — SODIUM CHLORIDE 0.9% FLUSH
2.0000 mL | Freq: Once | INTRAVENOUS | Status: AC
  Administered 2019-03-18: 2 mL

## 2019-03-18 MED ORDER — LIDOCAINE HCL 2 % IJ SOLN
20.0000 mL | Freq: Once | INTRAMUSCULAR | Status: AC
  Administered 2019-03-18: 400 mg

## 2019-03-18 MED ORDER — TRIAMCINOLONE ACETONIDE 40 MG/ML IJ SUSP
INTRAMUSCULAR | Status: AC
  Filled 2019-03-18: qty 1

## 2019-03-18 MED ORDER — LACTATED RINGERS IV SOLN
1000.0000 mL | Freq: Once | INTRAVENOUS | Status: AC
  Administered 2019-03-18: 10:00:00 1000 mL via INTRAVENOUS

## 2019-03-18 MED ORDER — FENTANYL CITRATE (PF) 100 MCG/2ML IJ SOLN
INTRAMUSCULAR | Status: AC
  Filled 2019-03-18: qty 2
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
## 2019-03-18 NOTE — Patient Instructions (Signed)

## 2019-03-18 NOTE — Progress Notes (Signed)
Safety precautions to be maintained throughout the outpatient stay will include: orient to surroundings, keep bed in low position, maintain call bell within reach at all times, provide assistance with transfer out of bed and ambulation.  

## 2019-03-19 ENCOUNTER — Telehealth: Payer: Self-pay

## 2019-03-19 NOTE — Telephone Encounter (Signed)
Post procedure phone call.  LM 

## 2019-03-31 ENCOUNTER — Encounter: Payer: Self-pay | Admitting: Pain Medicine

## 2019-03-31 NOTE — Progress Notes (Signed)
Pain relief after procedure (treated area only): (Questions asked to patient) 1. Starting about 15 minutes after the procedure, and "while the area was still numb" (from the local anesthetics), were you having any of your usual pain "in that area"? (NOT including the discomfort from the needle sticks.) First 1 hour:100 % better. First 4-6 hours: 100% better. 2. Assuming that it did get numb. How long was the area numb? 100 % benefit, longer than 6 hours. How long? 1.5 days. 3. How much better is your pain now, when compared to before the procedure? Current benefit: 0 % better. 4. Can you move better now? Improvement in ROM (Range of Motion): No. 5. Can you do more now? Improvement in function: No. 4. Did you have any problems with the procedure? Side-effects/Complications: No.

## 2019-04-02 DIAGNOSIS — J449 Chronic obstructive pulmonary disease, unspecified: Secondary | ICD-10-CM | POA: Diagnosis not present

## 2019-04-02 DIAGNOSIS — R05 Cough: Secondary | ICD-10-CM | POA: Diagnosis not present
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
## 2019-04-05 ENCOUNTER — Ambulatory Visit: Payer: Medicare Other | Attending: Pain Medicine | Admitting: Pain Medicine

## 2019-04-05 ENCOUNTER — Telehealth: Payer: Self-pay | Admitting: *Deleted

## 2019-04-05 ENCOUNTER — Other Ambulatory Visit: Payer: Self-pay

## 2019-04-05 DIAGNOSIS — M48061 Spinal stenosis, lumbar region without neurogenic claudication: Secondary | ICD-10-CM

## 2019-04-05 DIAGNOSIS — M545 Low back pain, unspecified: Secondary | ICD-10-CM

## 2019-04-05 DIAGNOSIS — M79605 Pain in left leg: Secondary | ICD-10-CM

## 2019-04-05 DIAGNOSIS — R252 Cramp and spasm: Secondary | ICD-10-CM | POA: Diagnosis not present

## 2019-04-05 DIAGNOSIS — G4701 Insomnia due to medical condition: Secondary | ICD-10-CM

## 2019-04-05 DIAGNOSIS — M961 Postlaminectomy syndrome, not elsewhere classified: Secondary | ICD-10-CM | POA: Diagnosis not present

## 2019-04-05 DIAGNOSIS — G894 Chronic pain syndrome: Secondary | ICD-10-CM

## 2019-04-05 DIAGNOSIS — Z87828 Personal history of other (healed) physical injury and trauma: Secondary | ICD-10-CM | POA: Diagnosis not present

## 2019-04-05 DIAGNOSIS — M5416 Radiculopathy, lumbar region: Secondary | ICD-10-CM

## 2019-04-05 DIAGNOSIS — G8929 Other chronic pain: Secondary | ICD-10-CM

## 2019-04-05 DIAGNOSIS — M7918 Myalgia, other site: Secondary | ICD-10-CM

## 2019-04-05 DIAGNOSIS — M792 Neuralgia and neuritis, unspecified: Secondary | ICD-10-CM

## 2019-04-05 MED ORDER — OXYCODONE HCL 5 MG PO TABS: 5.0000 mg | ORAL_TABLET | Freq: Three times a day (TID) | ORAL | 0 refills | Status: DC | PRN

## 2019-04-05 MED ORDER — CYCLOBENZAPRINE HCL 10 MG PO TABS: 10.0000 mg | ORAL_TABLET | Freq: Three times a day (TID) | ORAL | 2 refills | Status: DC | PRN

## 2019-04-05 MED ORDER — GABAPENTIN 300 MG PO CAPS: 300.0000 mg | ORAL_CAPSULE | Freq: Three times a day (TID) | ORAL | 5 refills | Status: DC

## 2019-04-05 MED ORDER — TRAZODONE HCL 100 MG PO TABS: 100.0000 mg | ORAL_TABLET | Freq: Every day | ORAL | 1 refills | Status: DC

## 2019-04-05 NOTE — Patient Instructions (Signed)

## 2019-04-08 ENCOUNTER — Other Ambulatory Visit: Payer: Self-pay

## 2019-04-08 ENCOUNTER — Ambulatory Visit
Admission: RE | Admit: 2019-04-08 | Discharge: 2019-04-08 | Disposition: A | Discharge status: Home / Self Care | Payer: Medicare Other | Source: Ambulatory Visit | Attending: Pain Medicine | Admitting: Pain Medicine

## 2019-04-08 ENCOUNTER — Encounter: Payer: Self-pay | Admitting: Pain Medicine

## 2019-04-08 ENCOUNTER — Ambulatory Visit (HOSPITAL_BASED_OUTPATIENT_CLINIC_OR_DEPARTMENT_OTHER): Payer: Medicare Other | Admitting: Pain Medicine

## 2019-04-08 VITALS — BP 124/73 | HR 95 | Temp 97.8°F | Resp 16 | Ht 64.0 in | Wt 129.0 lb

## 2019-04-08 DIAGNOSIS — M5137 Other intervertebral disc degeneration, lumbosacral region: Secondary | ICD-10-CM | POA: Insufficient documentation

## 2019-04-08 DIAGNOSIS — Z87828 Personal history of other (healed) physical injury and trauma: Secondary | ICD-10-CM

## 2019-04-08 DIAGNOSIS — M48061 Spinal stenosis, lumbar region without neurogenic claudication: Secondary | ICD-10-CM

## 2019-04-08 DIAGNOSIS — M47816 Spondylosis without myelopathy or radiculopathy, lumbar region: Secondary | ICD-10-CM

## 2019-04-08 DIAGNOSIS — M5416 Radiculopathy, lumbar region: Secondary | ICD-10-CM | POA: Diagnosis not present

## 2019-04-08 DIAGNOSIS — M545 Low back pain: Secondary | ICD-10-CM

## 2019-04-08 DIAGNOSIS — M51379 Other intervertebral disc degeneration, lumbosacral region without mention of lumbar back pain or lower extremity pain: Secondary | ICD-10-CM

## 2019-04-08 DIAGNOSIS — S32030S Wedge compression fracture of third lumbar vertebra, sequela: Secondary | ICD-10-CM | POA: Diagnosis not present

## 2019-04-08 DIAGNOSIS — G8929 Other chronic pain: Secondary | ICD-10-CM | POA: Insufficient documentation

## 2019-04-08 MED ORDER — ROPIVACAINE HCL 2 MG/ML IJ SOLN
2.0000 mL | Freq: Once | INTRAMUSCULAR | Status: AC
  Administered 2019-04-08: 12:00:00 2 mL via EPIDURAL

## 2019-04-08 MED ORDER — DEXAMETHASONE SODIUM PHOSPHATE 10 MG/ML IJ SOLN
10.0000 mg | Freq: Once | INTRAMUSCULAR | Status: AC
  Administered 2019-04-08: 10 mg

## 2019-04-08 MED ORDER — FENTANYL CITRATE (PF) 100 MCG/2ML IJ SOLN
INTRAMUSCULAR | Status: AC
  Filled 2019-04-08: qty 2

## 2019-04-08 MED ORDER — FENTANYL CITRATE (PF) 100 MCG/2ML IJ SOLN
25.0000 ug | INTRAMUSCULAR | Status: DC | PRN
  Administered 2019-04-08: 50 ug via INTRAVENOUS

## 2019-04-08 MED ORDER — SODIUM CHLORIDE (PF) 0.9 % IJ SOLN
INTRAMUSCULAR | Status: AC
  Filled 2019-04-08: qty 10

## 2019-04-08 MED ORDER — SODIUM CHLORIDE 0.9% FLUSH: 2.0000 mL | Freq: Once | INTRAVENOUS | Status: DC

## 2019-04-08 MED ORDER — MIDAZOLAM HCL 5 MG/5ML IJ SOLN
1.0000 mg | INTRAMUSCULAR | Status: DC | PRN
  Administered 2019-04-08: 12:00:00 2 mg via INTRAVENOUS

## 2019-04-08 MED ORDER — LACTATED RINGERS IV SOLN
1000.0000 mL | Freq: Once | INTRAVENOUS | Status: AC
  Administered 2019-04-08: 12:00:00 1000 mL via INTRAVENOUS

## 2019-04-08 MED ORDER — MIDAZOLAM HCL 5 MG/5ML IJ SOLN
INTRAMUSCULAR | Status: AC
  Filled 2019-04-08: qty 5

## 2019-04-08 MED ORDER — IOHEXOL 180 MG/ML  SOLN
10.0000 mL | Freq: Once | INTRAMUSCULAR | Status: AC
  Administered 2019-04-08: 12:00:00 10 mL via EPIDURAL

## 2019-04-08 MED ORDER — ROPIVACAINE HCL 2 MG/ML IJ SOLN
1.0000 mL | Freq: Once | INTRAMUSCULAR | Status: AC
  Administered 2019-04-08: 12:00:00 1 mL via EPIDURAL

## 2019-04-08 MED ORDER — ROPIVACAINE HCL 2 MG/ML IJ SOLN
INTRAMUSCULAR | Status: AC
  Filled 2019-04-08: qty 10

## 2019-04-08 MED ORDER — LIDOCAINE HCL 2 % IJ SOLN
20.0000 mL | Freq: Once | INTRAMUSCULAR | Status: AC
  Administered 2019-04-08: 400 mg
  Filled 2019-04-08: qty 20

## 2019-04-08 MED ORDER — DEXAMETHASONE SODIUM PHOSPHATE 10 MG/ML IJ SOLN
10.0000 mg | Freq: Once | INTRAMUSCULAR | Status: AC
  Administered 2019-04-08: 12:00:00 10 mg

## 2019-04-08 MED ORDER — DEXAMETHASONE SODIUM PHOSPHATE 10 MG/ML IJ SOLN
INTRAMUSCULAR | Status: AC
  Filled 2019-04-08: qty 2

## 2019-04-08 MED ORDER — SODIUM CHLORIDE 0.9% FLUSH
1.0000 mL | Freq: Once | INTRAVENOUS | Status: AC
  Administered 2019-04-08: 12:00:00 1 mL
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
## 2019-04-08 NOTE — Progress Notes (Signed)
Safety precautions to be maintained throughout the outpatient stay will include: orient to surroundings, keep bed in low position, maintain call bell within reach at all times, provide assistance with transfer out of bed and ambulation.  

## 2019-04-08 NOTE — Patient Instructions (Addendum)
____________________________________________________________________________________________  Post-Procedure Discharge Instructions  Instructions:  Apply ice:   Purpose: This will minimize any swelling and discomfort after procedure.   When: Day of procedure, as soon as you get home.  How: Fill a plastic sandwich bag with crushed ice. Cover it with a small towel and apply to injection site.  How long: (15 min on, 15 min off) Apply for 15 minutes then remove x 15 minutes.  Repeat sequence on day of procedure, until you go to bed.  Apply heat:   Purpose: To treat any soreness and discomfort from the procedure.  When: Starting the next day after the procedure.  How: Apply heat to procedure site starting the day following the procedure.  How long: May continue to repeat daily, until discomfort goes away.  Food intake: Start with clear liquids (like water) and advance to regular food, as tolerated.   Physical activities: Keep activities to a minimum for the first 8 hours after the procedure. After that, then as tolerated.  Driving: If you have received any sedation, be responsible and do not drive. You are not allowed to drive for 24 hours after having sedation.  Blood thinner: (Applies only to those taking blood thinners) You may restart your blood thinner 6 hours after your procedure.  Insulin: (Applies only to Diabetic patients taking insulin) As soon as you can eat, you may resume your normal dosing schedule.  Infection prevention: Keep procedure site clean and dry. Shower daily and clean area with soap and water.  Post-procedure Pain Diary: Extremely important that this be done correctly and accurately. Recorded information will be used to determine the next step in treatment. For the purpose of accuracy, follow these rules:  Evaluate only the area treated. Do not report or include pain from an untreated area. For the purpose of this evaluation, ignore all other areas of pain,  except for the treated area.  After your procedure, avoid taking a long nap and attempting to complete the pain diary after you wake up. Instead, set your alarm clock to go off every hour, on the hour, for the initial 8 hours after the procedure. Document the duration of the numbing medicine, and the relief you are getting from it.  Do not go to sleep and attempt to complete it later. It will not be accurate. If you received sedation, it is likely that you were given a medication that may cause amnesia. Because of this, completing the diary at a later time may cause the information to be inaccurate. This information is needed to plan your care.  Follow-up appointment: Keep your post-procedure follow-up evaluation appointment after the procedure (usually 2 weeks for most procedures, 6 weeks for radiofrequencies). DO NOT FORGET to bring you pain diary with you.   Expect: (What should I expect to see with my procedure?)  From numbing medicine (AKA: Local Anesthetics): Numbness or decrease in pain. You may also experience some weakness, which if present, could last for the duration of the local anesthetic.  Onset: Full effect within 15 minutes of injected.  Duration: It will depend on the type of local anesthetic used. On the average, 1 to 8 hours.   From steroids (Applies only if steroids were used): Decrease in swelling or inflammation. Once inflammation is improved, relief of the pain will follow.  Onset of benefits: Depends on the amount of swelling present. The more swelling, the longer it will take for the benefits to be seen. In some cases, up to 10 days.    Duration: Steroids will stay in the system x 2 weeks. Duration of benefits will depend on multiple posibilities including persistent irritating factors.  Side-effects: If present, they may typically last 2 weeks (the duration of the steroids).  Frequent: Cramps (if they occur, drink Gatorade and take over-the-counter Magnesium 450-500 mg  once to twice a day); water retention with temporary weight gain; increases in blood sugar; decreased immune system response; increased appetite.  Occasional: Facial flushing (red, warm cheeks); mood swings; menstrual changes.  Uncommon: Long-term decrease or suppression of natural hormones; bone thinning. (These are more common with higher doses or more frequent use. This is why we prefer that our patients avoid having any injection therapies in other practices.)   Very Rare: Severe mood changes; psychosis; aseptic necrosis.  From procedure: Some discomfort is to be expected once the numbing medicine wears off. This should be minimal if ice and heat are applied as instructed.  Call if: (When should I call?)  You experience numbness and weakness that gets worse with time, as opposed to wearing off.  New onset bowel or bladder incontinence. (Applies only to procedures done in the spine)  Emergency Numbers:  Durning business hours (Monday - Thursday, 8:00 AM - 4:00 PM) (Friday, 9:00 AM - 12:00 Noon): [phone]  After hours: [phone]  NOTE: If you are having a problem and are unable connect with, or to talk to a provider, then go to your nearest urgent care or emergency department. If the problem is serious and urgent, please call 911. ____________________________________________________________________________________________   Pain Management Discharge Instructions  General Discharge Instructions :  If you need to reach your doctor call: Monday-Friday 8:00 am - 4:00 pm at [phone] or toll free [phone].  After clinic hours [phone] to have operator reach doctor.  Bring all of your medication bottles to all your appointments in the pain clinic.  To cancel or reschedule your appointment with Pain Management please remember to call 24 hours in advance to avoid a fee.  Refer to the educational materials which you have been given on: General Risks, I had my  Procedure. Discharge Instructions, Post Sedation.  Post Procedure Instructions:  The drugs you were given will stay in your system until tomorrow, so for the next 24 hours you should not drive, make any legal decisions or drink any alcoholic beverages.  You may eat anything you prefer, but it is better to start with liquids then soups and crackers, and gradually work up to solid foods.  Please notify your doctor immediately if you have any unusual bleeding, trouble breathing or pain that is not related to your normal pain.  Depending on the type of procedure that was done, some parts of your body may feel week and/or numb.  This usually clears up by tonight or the next day.  Walk with the use of an assistive device or accompanied by an adult for the 24 hours.  You may use ice on the affected area for the first 24 hours.  Put ice in a Ziploc bag and cover with a towel and place against area 15 minutes on 15 minutes off.  You may switch to heat after 24 hours.Selective Nerve Root Block Patient Information  Description: Specific nerve roots exit the spinal canal and these nerves can be compressed and inflamed by a bulging disc and bone spurs.  By injecting steroids on the nerve root, we can potentially decrease the inflammation surrounding these nerves, which often leads to decreased pain.  Also, by injecting local anesthesia on the nerve root, this can provide [place] helpful information to give to your referring doctor if it decreases your pain.  Selective nerve root blocks can be done along the spine from the neck to the low back depending on the location of your pain.   After numbing the skin with local anesthesia, a small needle is passed to the nerve root and the position of the needle is verified using x-ray pictures.  After the needle is in correct position, we then deposit the medication.  You may experience a pressure sensation while this is being done.  The entire block usually lasts less than  15 minutes.  Conditions that may be treated with selective nerve root blocks:  Low back and leg pain  Spinal stenosis  Diagnostic block prior to potential surgery  Neck and arm pain  Post laminectomy syndrome  Preparation for the injection:  1. Do not eat any solid food or dairy products within 8 hours of your appointment. 2. You may drink clear liquids up to 3 hours before an appointment.  Clear liquids include water, black coffee, juice or soda.  No milk or cream please. 3. You may take your regular medications, including pain medications, with a sip of water before your appointment.  Diabetics should hold regular insulin (if taken separately) and take 1/2 normal NPH dose the morning of the procedure.  Carry some sugar containing items with you to your appointment. 4. A driver must accompany you and be prepared to drive you home after your procedure. 5. Bring all your current medications with you. 6. An IV may be inserted and sedation may be given at the discretion of the physician. 7. A blood pressure cuff, EKG, and other monitors will often be applied during the procedure.  Some patients may need to have extra oxygen administered for a short period. 8. You will be asked to provide medical information, including allergies, prior to the procedure.  We must know immediately if you are taking blood  Thinners (like Coumadin) or if you are allergic to IV iodine contrast (dye).  Possible side-effects: All are usually temporary  Bleeding from needle site  Light headedness  Numbness and tingling  Decreased blood pressure  Weakness in arms/legs  Pressure sensation in back/neck  Pain at injection site (several days)  Possible complications: All are extremely rare  Infection  Nerve injury  Spinal headache (a headache wore with upright position)  Call if you experience:  Fever/chills associated with headache or increased back/neck pain  Headache worsened by an upright  position  New onset weakness or numbness of an extremity below the injection site  Hives or difficulty breathing (go to the emergency room)  Inflammation or drainage at the injection site(s)  Severe back/neck pain greater than usual  New symptoms which are concerning to you  Please note:  Although the local anesthetic injected can often make your back or neck feel good for several hours after the injection the pain will likely return.  It takes 3-5 days for steroids to work on the nerve root. You may not notice any pain relief for at least one week.  If effective, we will often do a series of 3 injections spaced 3-6 weeks apart to maximally decrease your pain.    If you have any questions, please call [phone] [facility]

## 2019-04-09 ENCOUNTER — Telehealth: Payer: Self-pay

## 2019-04-09 NOTE — Telephone Encounter (Signed)
Denies any needs at this time. Instructed to call if needed. 

## 2019-04-15 ENCOUNTER — Telehealth: Payer: Self-pay | Admitting: Pain Medicine

## 2019-04-15 NOTE — Telephone Encounter (Signed)
Patient called stating her back is killing her, she has taken her meds and doesn't know what to do. Please call patient

## 2019-04-15 NOTE — Telephone Encounter (Signed)
Called patient and she states that her head is hurting real bad that it feels like someone hit her with a bat. She also states that her back started hurting all of a sudden, and only wants to drink fluids and not food. Informed her that Dr Dossie Arbour  has finished for the day. Instructed her that she should go to the ED if not feeling well, especially with the bad headache. Patient with understanding.

## 2019-04-21 ENCOUNTER — Encounter: Payer: Self-pay | Admitting: Pain Medicine

## 2019-04-21 NOTE — Progress Notes (Signed)
Pain relief after procedure (treated area only): (Questions asked to patient) 1. Starting about 15 minutes after the procedure, and "while the area was still numb" (from the local anesthetics), were you having any of your usual pain "in that area" (the treated area)?  (NOTE: NOT including the discomfort from the needle sticks.) First 1 hour:100 % better. First 4-6 hours:100 % better. 2. How long did the numbness from the local anesthetics last? (More than 6 hours?) Duration:3 days hours.  3. How much better is your pain now, when compared to before the procedure? Current benefit: 30 % better. 4. Can you move better now? Improvement in ROM (Range of Motion): No. 5. Can you do more now? Improvement in function: No. 4. Did you have any problems with the procedure? Side-effects/Complications: No.
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
## 2019-04-26 ENCOUNTER — Other Ambulatory Visit: Payer: Self-pay

## 2019-04-26 ENCOUNTER — Ambulatory Visit: Payer: Medicare Other | Attending: Pain Medicine | Admitting: Pain Medicine

## 2019-04-26 DIAGNOSIS — M79605 Pain in left leg: Secondary | ICD-10-CM

## 2019-04-26 DIAGNOSIS — G894 Chronic pain syndrome: Secondary | ICD-10-CM

## 2019-04-26 DIAGNOSIS — M25551 Pain in right hip: Secondary | ICD-10-CM | POA: Diagnosis not present

## 2019-04-26 DIAGNOSIS — M545 Low back pain: Secondary | ICD-10-CM | POA: Diagnosis not present

## 2019-04-26 DIAGNOSIS — G8929 Other chronic pain: Secondary | ICD-10-CM

## 2019-04-26 DIAGNOSIS — M25552 Pain in left hip: Secondary | ICD-10-CM

## 2019-04-26 MED ORDER — GABAPENTIN 300 MG PO CAPS: 300.0000 mg | ORAL_CAPSULE | Freq: Four times a day (QID) | ORAL | 2 refills | Status: DC

## 2019-04-27 DIAGNOSIS — E538 Deficiency of other specified B group vitamins: Secondary | ICD-10-CM | POA: Diagnosis not present

## 2019-04-28 DIAGNOSIS — M79661 Pain in right lower leg: Secondary | ICD-10-CM | POA: Diagnosis not present

## 2019-04-28 DIAGNOSIS — G603 Idiopathic progressive neuropathy: Secondary | ICD-10-CM | POA: Diagnosis not present

## 2019-05-02 DIAGNOSIS — R05 Cough: Secondary | ICD-10-CM | POA: Diagnosis not present

## 2019-05-02 DIAGNOSIS — J449 Chronic obstructive pulmonary disease, unspecified: Secondary | ICD-10-CM | POA: Diagnosis not present

## 2019-05-10 DIAGNOSIS — Z1211 Encounter for screening for malignant neoplasm of colon: Secondary | ICD-10-CM | POA: Diagnosis not present

## 2019-05-10 DIAGNOSIS — R1900 Intra-abdominal and pelvic swelling, mass and lump, unspecified site: Secondary | ICD-10-CM | POA: Diagnosis not present

## 2019-05-12 ENCOUNTER — Telehealth: Payer: Medicare Other | Admitting: Pain Medicine

## 2019-06-02 DIAGNOSIS — J449 Chronic obstructive pulmonary disease, unspecified: Secondary | ICD-10-CM | POA: Diagnosis not present

## 2019-06-02 DIAGNOSIS — R05 Cough: Secondary | ICD-10-CM | POA: Diagnosis not present

## 2019-06-16 DIAGNOSIS — R5383 Other fatigue: Secondary | ICD-10-CM | POA: Diagnosis not present

## 2019-06-16 DIAGNOSIS — R509 Fever, unspecified: Secondary | ICD-10-CM | POA: Diagnosis not present

## 2019-06-16 DIAGNOSIS — R070 Pain in throat: Secondary | ICD-10-CM | POA: Diagnosis not present

## 2019-06-16 DIAGNOSIS — J449 Chronic obstructive pulmonary disease, unspecified: Secondary | ICD-10-CM | POA: Diagnosis not present

## 2019-07-03 DIAGNOSIS — R05 Cough: Secondary | ICD-10-CM | POA: Diagnosis not present

## 2019-07-03 DIAGNOSIS — J449 Chronic obstructive pulmonary disease, unspecified: Secondary | ICD-10-CM | POA: Diagnosis not present

## 2019-07-07 ENCOUNTER — Telehealth: Payer: Self-pay | Admitting: Pulmonary Disease

## 2019-07-07 NOTE — Telephone Encounter (Signed)
Called and spoke with Patient.  Patient is requested another pulmonologist.  Patient LOV was 02/26/18, for COPD with chronic bronchitis, by Dr. Ander Slade. Patient stated Dr. Ander Slade did not prescribe medication for her. Patient stated she did not know who she wanted to switch to.  Dr. Ander Slade, please advise

## 2019-07-08 NOTE — Telephone Encounter (Signed)
Okay to switch, could be to anyone

## 2019-07-08 NOTE — Telephone Encounter (Signed)
Attempted to call pt but unable to reach. Left message for pt to return call.  When pt returns call, please schedule pt a 57min visit for pt with a new pulmonologist. Thank you!

## 2019-07-09 NOTE — Telephone Encounter (Signed)
LMTCB x2 for pt 

## 2019-07-12 NOTE — Telephone Encounter (Signed)
LMTCB x3 for pt. We have attempted to contact pt several times with no success or call back from pt. Per triage protocol, message will be closed.   

## 2019-07-15 ENCOUNTER — Telehealth: Payer: Self-pay | Admitting: *Deleted

## 2019-07-15 ENCOUNTER — Telehealth: Payer: Self-pay | Admitting: Pain Medicine

## 2019-07-15 ENCOUNTER — Encounter: Payer: Self-pay | Admitting: Pain Medicine

## 2019-07-15 NOTE — Telephone Encounter (Signed)
Please schedule VV

## 2019-07-15 NOTE — Telephone Encounter (Signed)
Patient states she is having crushing pain in her back and it is going down into legs. Would like to see what Dr. Dossie Arbour recommends
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
## 2019-07-19 ENCOUNTER — Ambulatory Visit: Payer: Medicare Other | Attending: Pain Medicine | Admitting: Pain Medicine

## 2019-07-19 ENCOUNTER — Other Ambulatory Visit: Payer: Self-pay

## 2019-07-19 DIAGNOSIS — M961 Postlaminectomy syndrome, not elsewhere classified: Secondary | ICD-10-CM

## 2019-07-19 DIAGNOSIS — M899 Disorder of bone, unspecified: Secondary | ICD-10-CM

## 2019-07-19 DIAGNOSIS — Z87828 Personal history of other (healed) physical injury and trauma: Secondary | ICD-10-CM | POA: Diagnosis not present

## 2019-07-19 DIAGNOSIS — M47816 Spondylosis without myelopathy or radiculopathy, lumbar region: Secondary | ICD-10-CM

## 2019-07-19 DIAGNOSIS — M545 Low back pain: Secondary | ICD-10-CM | POA: Diagnosis not present

## 2019-07-19 DIAGNOSIS — Z79899 Other long term (current) drug therapy: Secondary | ICD-10-CM

## 2019-07-19 DIAGNOSIS — M5416 Radiculopathy, lumbar region: Secondary | ICD-10-CM

## 2019-07-19 DIAGNOSIS — G894 Chronic pain syndrome: Secondary | ICD-10-CM

## 2019-07-19 DIAGNOSIS — M5137 Other intervertebral disc degeneration, lumbosacral region: Secondary | ICD-10-CM

## 2019-07-19 DIAGNOSIS — M79605 Pain in left leg: Secondary | ICD-10-CM | POA: Diagnosis not present

## 2019-07-19 DIAGNOSIS — M48061 Spinal stenosis, lumbar region without neurogenic claudication: Secondary | ICD-10-CM

## 2019-07-19 DIAGNOSIS — Z789 Other specified health status: Secondary | ICD-10-CM

## 2019-07-19 DIAGNOSIS — G8929 Other chronic pain: Secondary | ICD-10-CM

## 2019-07-19 MED ORDER — GABAPENTIN 300 MG PO CAPS: 300.0000 mg | ORAL_CAPSULE | Freq: Four times a day (QID) | ORAL | 5 refills | Status: DC

## 2019-07-19 MED ORDER — OXYCODONE HCL 5 MG PO TABS: 5.0000 mg | ORAL_TABLET | Freq: Three times a day (TID) | ORAL | 0 refills | Status: DC | PRN

## 2019-07-19 NOTE — Patient Instructions (Signed)

## 2019-07-22 ENCOUNTER — Ambulatory Visit (INDEPENDENT_AMBULATORY_CARE_PROVIDER_SITE_OTHER): Payer: Medicare Other | Admitting: Critical Care Medicine

## 2019-07-22 ENCOUNTER — Encounter: Payer: Self-pay | Admitting: Critical Care Medicine

## 2019-07-22 ENCOUNTER — Other Ambulatory Visit: Payer: Self-pay

## 2019-07-22 VITALS — BP 110/60 | HR 100 | Temp 97.1°F | Ht 64.0 in | Wt 130.2 lb

## 2019-07-22 DIAGNOSIS — Z87891 Personal history of nicotine dependence: Secondary | ICD-10-CM

## 2019-07-22 DIAGNOSIS — R600 Localized edema: Secondary | ICD-10-CM | POA: Diagnosis not present

## 2019-07-22 DIAGNOSIS — J449 Chronic obstructive pulmonary disease, unspecified: Secondary | ICD-10-CM | POA: Diagnosis not present

## 2019-07-22 MED ORDER — STIOLTO RESPIMAT 2.5-2.5 MCG/ACT IN AERS: 2.0000 | INHALATION_SPRAY | Freq: Every day | RESPIRATORY_TRACT | 11 refills | Status: DC

## 2019-07-22 MED ORDER — BENZONATATE 100 MG PO CAPS: 100.0000 mg | ORAL_CAPSULE | Freq: Three times a day (TID) | ORAL | 2 refills | Status: AC | PRN

## 2019-07-22 NOTE — Patient Instructions (Addendum)
Thank you for visiting Dr. Carlis Abbott at [facility]. We recommend the following: Orders Placed This Encounter  Procedures  . Ambulatory Referral for Lung Cancer Scre  . VAS [place] LOWER EXTREMITY VENOUS (DVT)   Orders Placed This Encounter  Procedures  . Ambulatory Referral for Lung Cancer Scre    Referral Priority:   Routine    Referral Type:   Consultation    Referral Reason:   Specialty Services Required    Number of Visits Requested:   1    Meds ordered this encounter  Medications  . Tiotropium Bromide-Olodaterol (STIOLTO RESPIMAT) 2.5-2.5 MCG/ACT AERS    Sig: Inhale 2 puffs into the lungs daily.    Dispense:  4 g    Refill:  11    Return in about 3 months (around 10/22/2019).    Please do your part to reduce the spread of COVID-19.
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
## 2019-07-27 ENCOUNTER — Ambulatory Visit (HOSPITAL_BASED_OUTPATIENT_CLINIC_OR_DEPARTMENT_OTHER): Payer: Medicare Other | Admitting: Pain Medicine

## 2019-07-27 ENCOUNTER — Ambulatory Visit
Admission: RE | Admit: 2019-07-27 | Discharge: 2019-07-27 | Disposition: A | Discharge status: Home / Self Care | Payer: Medicare Other | Source: Ambulatory Visit | Attending: Pain Medicine | Admitting: Pain Medicine

## 2019-07-27 ENCOUNTER — Other Ambulatory Visit: Payer: Self-pay

## 2019-07-27 ENCOUNTER — Encounter: Payer: Self-pay | Admitting: Pain Medicine

## 2019-07-27 VITALS — BP 135/67 | HR 109 | Temp 98.1°F | Resp 16 | Ht 64.0 in | Wt 127.0 lb

## 2019-07-27 DIAGNOSIS — M48061 Spinal stenosis, lumbar region without neurogenic claudication: Secondary | ICD-10-CM | POA: Insufficient documentation

## 2019-07-27 DIAGNOSIS — M5137 Other intervertebral disc degeneration, lumbosacral region: Secondary | ICD-10-CM | POA: Diagnosis not present

## 2019-07-27 DIAGNOSIS — M5416 Radiculopathy, lumbar region: Secondary | ICD-10-CM | POA: Diagnosis not present

## 2019-07-27 DIAGNOSIS — Z87828 Personal history of other (healed) physical injury and trauma: Secondary | ICD-10-CM | POA: Insufficient documentation

## 2019-07-27 DIAGNOSIS — M961 Postlaminectomy syndrome, not elsewhere classified: Secondary | ICD-10-CM

## 2019-07-27 DIAGNOSIS — M25552 Pain in left hip: Secondary | ICD-10-CM | POA: Diagnosis not present

## 2019-07-27 DIAGNOSIS — G8929 Other chronic pain: Secondary | ICD-10-CM | POA: Diagnosis not present

## 2019-07-27 DIAGNOSIS — M545 Low back pain: Secondary | ICD-10-CM | POA: Diagnosis not present

## 2019-07-27 DIAGNOSIS — M25551 Pain in right hip: Secondary | ICD-10-CM | POA: Diagnosis not present

## 2019-07-27 MED ORDER — LIDOCAINE HCL 2 % IJ SOLN
20.0000 mL | Freq: Once | INTRAMUSCULAR | Status: AC
  Administered 2019-07-27: 11:00:00 400 mg
  Filled 2019-07-27: qty 20

## 2019-07-27 MED ORDER — DEXAMETHASONE SODIUM PHOSPHATE 10 MG/ML IJ SOLN
20.0000 mg | Freq: Once | INTRAMUSCULAR | Status: AC
  Administered 2019-07-27: 20 mg

## 2019-07-27 MED ORDER — MIDAZOLAM HCL 5 MG/5ML IJ SOLN
1.0000 mg | INTRAMUSCULAR | Status: DC | PRN
  Administered 2019-07-27: 11:00:00 5 mg via INTRAVENOUS

## 2019-07-27 MED ORDER — FENTANYL CITRATE (PF) 100 MCG/2ML IJ SOLN
25.0000 ug | INTRAMUSCULAR | Status: DC | PRN
  Administered 2019-07-27: 50 ug via INTRAVENOUS

## 2019-07-27 MED ORDER — MIDAZOLAM HCL 5 MG/5ML IJ SOLN
INTRAMUSCULAR | Status: AC
  Filled 2019-07-27: qty 5

## 2019-07-27 MED ORDER — ROPIVACAINE HCL 2 MG/ML IJ SOLN
INTRAMUSCULAR | Status: AC
  Filled 2019-07-27: qty 10

## 2019-07-27 MED ORDER — SODIUM CHLORIDE (PF) 0.9 % IJ SOLN
INTRAMUSCULAR | Status: AC
  Filled 2019-07-27: qty 10

## 2019-07-27 MED ORDER — IOHEXOL 180 MG/ML  SOLN
INTRAMUSCULAR | Status: AC
  Filled 2019-07-27: qty 20

## 2019-07-27 MED ORDER — DEXAMETHASONE SODIUM PHOSPHATE 10 MG/ML IJ SOLN
INTRAMUSCULAR | Status: AC
  Filled 2019-07-27: qty 2

## 2019-07-27 MED ORDER — SODIUM CHLORIDE 0.9% FLUSH
2.0000 mL | Freq: Once | INTRAVENOUS | Status: AC
  Administered 2019-07-27: 2 mL

## 2019-07-27 MED ORDER — LACTATED RINGERS IV SOLN
1000.0000 mL | Freq: Once | INTRAVENOUS | Status: AC
  Administered 2019-07-27: 1000 mL via INTRAVENOUS

## 2019-07-27 MED ORDER — FENTANYL CITRATE (PF) 100 MCG/2ML IJ SOLN
INTRAMUSCULAR | Status: AC
  Filled 2019-07-27: qty 2

## 2019-07-27 MED ORDER — IOHEXOL 180 MG/ML  SOLN
10.0000 mL | Freq: Once | INTRAMUSCULAR | Status: AC
  Administered 2019-07-27: 10 mL via EPIDURAL

## 2019-07-27 MED ORDER — ROPIVACAINE HCL 2 MG/ML IJ SOLN
2.0000 mL | Freq: Once | INTRAMUSCULAR | Status: AC
  Administered 2019-07-27: 2 mL via EPIDURAL

## 2019-07-27 NOTE — Progress Notes (Signed)
Safety precautions to be maintained throughout the outpatient stay will include: orient to surroundings, keep bed in low position, maintain call bell within reach at all times, provide assistance with transfer out of bed and ambulation.  

## 2019-07-27 NOTE — Patient Instructions (Signed)
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
## 2019-07-28 ENCOUNTER — Other Ambulatory Visit: Payer: Self-pay

## 2019-07-28 ENCOUNTER — Ambulatory Visit (HOSPITAL_COMMUNITY)
Admission: RE | Admit: 2019-07-28 | Discharge: 2019-07-28 | Disposition: A | Discharge status: Home / Self Care | Payer: Medicare Other | Source: Ambulatory Visit | Attending: Cardiology | Admitting: Cardiology

## 2019-07-28 ENCOUNTER — Other Ambulatory Visit: Payer: Self-pay | Admitting: Pain Medicine

## 2019-07-28 ENCOUNTER — Telehealth: Payer: Self-pay | Admitting: *Deleted

## 2019-07-28 DIAGNOSIS — R600 Localized edema: Secondary | ICD-10-CM

## 2019-07-28 DIAGNOSIS — R252 Cramp and spasm: Secondary | ICD-10-CM

## 2019-07-28 DIAGNOSIS — G8929 Other chronic pain: Secondary | ICD-10-CM

## 2019-07-28 NOTE — Progress Notes (Signed)
Please let Paula Duncan know that she does not have a blood clot. Thanks!

## 2019-07-28 NOTE — Telephone Encounter (Signed)
Attempted to call for post procedure follow-up. Message left. 

## 2019-07-29 ENCOUNTER — Telehealth: Payer: Self-pay | Admitting: Critical Care Medicine

## 2019-07-29 NOTE — Telephone Encounter (Signed)
The fluid in her leg they could see was the swelling that we can see from the outside, which was why the [place] was done. Not sure why it is there, but she should follow up with her PCP to investigate.   LPC

## 2019-07-29 NOTE — Telephone Encounter (Signed)
  Spoke with pt, advised results. Pt stated the ultrasound tech mentioned that she had fluid in her leg. She wanted to know if Dr. Carlis Abbott knew that and what her recommendations are. Please advise.    Julian Hy, DO  07/28/2019 6:20 PM EDT    Please let Ms. Defelice know that she does not have a blood clot. Thanks!

## 2019-07-30 NOTE — Telephone Encounter (Signed)
LMTCB for the pt 

## 2019-08-02 ENCOUNTER — Telehealth: Payer: Self-pay | Admitting: Pain Medicine

## 2019-08-02 ENCOUNTER — Other Ambulatory Visit: Payer: Self-pay | Admitting: *Deleted

## 2019-08-02 DIAGNOSIS — Z87891 Personal history of nicotine dependence: Secondary | ICD-10-CM

## 2019-08-02 NOTE — Telephone Encounter (Signed)
lmtcb for pt.  

## 2019-08-02 NOTE — Telephone Encounter (Signed)
Patient is calling to see if her flexiril was sent in to pharmacy?

## 2019-08-02 NOTE — Telephone Encounter (Signed)
Called patient and instructed her that she should have refills at pharm to last until 7/21.

## 2019-08-03 ENCOUNTER — Telehealth: Payer: Self-pay | Admitting: Pain Medicine

## 2019-08-03 NOTE — Telephone Encounter (Signed)
Will you refill  Flexeril or does she need a VV?

## 2019-08-03 NOTE — Telephone Encounter (Signed)
Patient lvmail 10:06 pm 08-02-19 stating the pharmacy does not have a refill for flexiril. Please call pharmacy and check on this. She states she takes 3 a day. And there are no refills left. Please let patient know status.

## 2019-08-03 NOTE — Telephone Encounter (Signed)
Called pt and advised message from the provider. Pt understood and verbalized understanding. Nothing further is needed.    

## 2019-08-05 ENCOUNTER — Telehealth: Payer: Self-pay | Admitting: Pain Medicine

## 2019-08-05 DIAGNOSIS — J449 Chronic obstructive pulmonary disease, unspecified: Secondary | ICD-10-CM | POA: Diagnosis not present

## 2019-08-05 DIAGNOSIS — R05 Cough: Secondary | ICD-10-CM | POA: Diagnosis not present

## 2019-08-05 NOTE — Telephone Encounter (Signed)
Patient had her VV w/ Dr. Dossie Arbour he did not send in [place]. Please send script for flexiril today. Patient is very irate about this.

## 2019-08-06 ENCOUNTER — Other Ambulatory Visit: Payer: Self-pay | Admitting: *Deleted

## 2019-08-06 ENCOUNTER — Telehealth: Payer: Self-pay | Admitting: Pain Medicine

## 2019-08-06 NOTE — Telephone Encounter (Signed)
Paula Duncan has called again. She had appt 07-27-19 for procedure and Dr. Dossie Arbour sent in her meds refill, except her cyclobenzaprine. She is out and needs to get this filled. Cyclobenzaprine 10mg  3 times day. Please contact Dr. Dossie Arbour and ask him to send this script to her pharmacy. He moved her refill appointment to 11-03-19 Thank you

## 2019-08-06 NOTE — Progress Notes (Signed)
Spoke with Dr Dossie Arbour and he gave a verbal order to please send in [place] for patient since it was missed at last visit. Flexeril 10 mg tid qty 90 sent in to her pharmacy

## 2019-08-11 ENCOUNTER — Telehealth: Payer: Medicare Other | Admitting: Pain Medicine
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
## 2019-08-30 ENCOUNTER — Encounter: Payer: Self-pay | Admitting: Acute Care

## 2019-08-30 ENCOUNTER — Telehealth: Payer: Self-pay | Admitting: *Deleted

## 2019-08-30 ENCOUNTER — Other Ambulatory Visit: Payer: Self-pay

## 2019-08-30 ENCOUNTER — Ambulatory Visit: Admission: RE | Admit: 2019-08-30 | Payer: Medicare Other | Source: Ambulatory Visit

## 2019-08-30 ENCOUNTER — Ambulatory Visit: Payer: Medicare Other | Attending: Pain Medicine | Admitting: Pain Medicine

## 2019-08-30 ENCOUNTER — Ambulatory Visit (INDEPENDENT_AMBULATORY_CARE_PROVIDER_SITE_OTHER): Payer: Medicare Other | Admitting: Acute Care

## 2019-08-30 DIAGNOSIS — M545 Low back pain, unspecified: Secondary | ICD-10-CM

## 2019-08-30 DIAGNOSIS — M48061 Spinal stenosis, lumbar region without neurogenic claudication: Secondary | ICD-10-CM | POA: Diagnosis not present

## 2019-08-30 DIAGNOSIS — Z87891 Personal history of nicotine dependence: Secondary | ICD-10-CM | POA: Diagnosis not present

## 2019-08-30 DIAGNOSIS — M5416 Radiculopathy, lumbar region: Secondary | ICD-10-CM | POA: Diagnosis not present

## 2019-08-30 DIAGNOSIS — Z87828 Personal history of other (healed) physical injury and trauma: Secondary | ICD-10-CM

## 2019-08-30 DIAGNOSIS — Z79899 Other long term (current) drug therapy: Secondary | ICD-10-CM

## 2019-08-30 DIAGNOSIS — G8929 Other chronic pain: Secondary | ICD-10-CM

## 2019-08-30 DIAGNOSIS — M961 Postlaminectomy syndrome, not elsewhere classified: Secondary | ICD-10-CM | POA: Diagnosis not present

## 2019-08-30 DIAGNOSIS — G894 Chronic pain syndrome: Secondary | ICD-10-CM

## 2019-08-30 DIAGNOSIS — M5137 Other intervertebral disc degeneration, lumbosacral region: Secondary | ICD-10-CM

## 2019-08-30 MED ORDER — OXYCODONE HCL 5 MG PO TABS: 5.0000 mg | ORAL_TABLET | Freq: Three times a day (TID) | ORAL | 0 refills | Status: DC | PRN

## 2019-08-30 NOTE — Progress Notes (Signed)
Shared Decision Making Visit Lung Cancer Screening Program [phone])   Eligibility:  Age 59 y.o.  Pack Years Smoking History Calculation 33 pack year smoking history (# packs/per year x # years smoked)  Recent History of coughing up blood  no  Unexplained weight loss? no ( >Than 15 pounds within the last 6 months )  Prior History Lung / other cancer no (Diagnosis within the last 5 years already requiring surveillance chest CT Scans).  Smoking Status Former Smoker  Former Smokers: Years since quit: < 1 year  Quit Date: 04/2019  Visit Components:  Discussion included one or more decision making aids. yes  Discussion included risk/benefits of screening. yes  Discussion included potential follow up diagnostic testing for abnormal scans. yes  Discussion included meaning and risk of over diagnosis. yes  Discussion included meaning and risk of False Positives. yes  Discussion included meaning of total radiation exposure. yes  Counseling Included:  Importance of adherence to annual lung cancer LDCT screening. yes  Impact of comorbidities on ability to participate in the program. yes  Ability and willingness to under diagnostic treatment. yes  Smoking Cessation Counseling:  Current Smokers:   Discussed importance of smoking cessation. yes  Information about tobacco cessation classes and interventions provided to patient. yes  Patient provided with "ticket" for LDCT Scan. yes  Symptomatic Patient. no  Counseling  Diagnosis Code: Tobacco Use Z72.0  Asymptomatic Patient yes  Counseling (Intermediate counseling: > three minutes counseling) [id number]  Former Smokers:   Discussed the importance of maintaining cigarette abstinence. yes  Diagnosis Code: Personal History of Nicotine Dependence. [id number]  Information about tobacco cessation classes and interventions provided to patient. Yes  Patient provided with "ticket" for LDCT Scan. yes  Written Order for Lung  Cancer Screening with LDCT placed in Epic. Yes (CT Chest Lung Cancer Screening Low Dose W/O CM) [id number] Z12.2-Screening of respiratory organs Z87.891-Personal history of nicotine dependence  I spent 25 minutes of face to face time with Paula Duncan discussing the risks and benefits of lung cancer screening. We viewed a power point together that explained in detail the above noted topics. We took the time to pause the power point at intervals to allow for questions to be asked and answered to ensure understanding. We discussed that she had taken the single most powerful action possible to decrease her risk of developing lung cancer when she quit smoking. I counseled her to remain smoke free, and to contact me if she ever had the desire to smoke again so that I can provide resources and tools to help support the effort to remain smoke free. We discussed the time and location of the scan, and that either  Paula Glassman RN or I will call with the results within  24-48 hours of receiving them. She has my card and contact information in the event she needs to speak with me, in addition to a copy of the power point we reviewed as a resource. She verbalized understanding of all of the above and had no further questions upon leaving the office.     I explained to the patient that there has been a high incidence of coronary artery disease noted on these exams. I explained that this is a non-gated exam therefore degree or severity cannot be determined. This patient is currently on statin therapy. I have asked the patient to follow-up with their PCP regarding any incidental finding of coronary artery disease and management with diet or medication as they feel is  clinically indicated. The patient verbalized understanding of the above and had no further questions.     Magdalen Spatz, NP 08/30/2019

## 2019-08-30 NOTE — Patient Instructions (Signed)
Thank you for participating in the [facility] Screening Program. It was our pleasure to meet you today. We will call you with the results of your scan within the next few days. Your scan will be assigned a Lung RADS category score by the physicians reading the scans.  This Lung RADS score determines follow up scanning.  See below for description of categories, and follow up screening recommendations. We will be in touch to schedule your follow up screening annually or based on recommendations of our providers. We will fax a copy of your scan results to your Primary Care Physician, or the physician who referred you to the program, to ensure they have the results. Please call the office if you have any questions or concerns regarding your scanning experience or results.  Our office number is [phone]. Please speak with Denise Phelps, RN. She is our Lung Cancer Screening RN. If she is unavailable when you call, please have the office staff send her a message. She will return your call at her earliest convenience. Remember, if your scan is normal, we will scan you annually as long as you continue to meet the criteria for the program. (Age 55-77, Current smoker or smoker who has quit within the last 15 years). If you are a smoker, remember, quitting is the single most powerful action that you can take to decrease your risk of lung cancer and other pulmonary, breathing related problems. We know quitting is hard, and we are here to help.  Please let us know if there is anything we can do to help you meet your goal of quitting. If you are a former smoker, congratulations. We are proud of you! Remain smoke free! Remember you can refer friends or family members through the number above.  We will screen them to make sure they meet criteria for the program. Thank you for helping us take better care of you by participating in Lung Screening.  Lung RADS Categories:  Lung RADS 1: no nodules  or definitely non-concerning nodules.  Recommendation is for a repeat annual scan in 12 months.  Lung RADS 2:  nodules that are non-concerning in appearance and behavior with a very low likelihood of becoming an active cancer. Recommendation is for a repeat annual scan in 12 months.  Lung RADS 3: nodules that are probably non-concerning , includes nodules with a low likelihood of becoming an active cancer.  Recommendation is for a 6-month repeat screening scan. Often noted after an upper respiratory illness. We will be in touch to make sure you have no questions, and to schedule your 6-month scan.  Lung RADS 4 A: nodules with concerning findings, recommendation is most often for a follow up scan in 3 months or additional testing based on our provider's assessment of the scan. We will be in touch to make sure you have no questions and to schedule the recommended 3 month follow up scan.  Lung RADS 4 B:  indicates findings that are concerning. We will be in touch with you to schedule additional diagnostic testing based on our provider's  assessment of the scan.   

## 2019-08-31 ENCOUNTER — Telehealth: Payer: Self-pay
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
## 2019-09-15 ENCOUNTER — Inpatient Hospital Stay: Admission: RE | Admit: 2019-09-15 | Payer: Medicare Other | Source: Ambulatory Visit

## 2019-09-21 DIAGNOSIS — E538 Deficiency of other specified B group vitamins: Secondary | ICD-10-CM | POA: Diagnosis not present

## 2019-09-21 DIAGNOSIS — R5383 Other fatigue: Secondary | ICD-10-CM | POA: Diagnosis not present

## 2019-09-21 DIAGNOSIS — K046 Periapical abscess with sinus: Secondary | ICD-10-CM | POA: Diagnosis not present

## 2019-09-21 DIAGNOSIS — L03115 Cellulitis of right lower limb: Secondary | ICD-10-CM | POA: Diagnosis not present

## 2019-09-30 DIAGNOSIS — J449 Chronic obstructive pulmonary disease, unspecified: Secondary | ICD-10-CM | POA: Diagnosis not present

## 2019-09-30 DIAGNOSIS — R05 Cough: Secondary | ICD-10-CM | POA: Diagnosis not present

## 2019-10-01 ENCOUNTER — Other Ambulatory Visit: Payer: Self-pay

## 2019-10-01 ENCOUNTER — Ambulatory Visit
Admission: RE | Admit: 2019-10-01 | Discharge: 2019-10-01 | Disposition: A | Discharge status: Home / Self Care | Payer: Medicare Other | Source: Ambulatory Visit | Attending: Acute Care | Admitting: Acute Care

## 2019-10-01 DIAGNOSIS — F17210 Nicotine dependence, cigarettes, uncomplicated: Secondary | ICD-10-CM | POA: Diagnosis not present

## 2019-10-01 DIAGNOSIS — Z87891 Personal history of nicotine dependence: Secondary | ICD-10-CM

## 2019-10-04 NOTE — Progress Notes (Signed)
Please call patient and let them  know their  low dose Ct was read as a Lung RADS 2: nodules that are benign in appearance and behavior with a very low likelihood of becoming a clinically active cancer due to size or lack of growth. Recommendation per radiology is for a repeat LDCT in 12 months. .Please let them  know we will order and schedule their  annual screening scan for 09/2020. Please let them  know there was notation of CAD on their  scan.  Please remind the patient  that this is a non-gated exam therefore degree or severity of disease  cannot be determined. Please have them  follow up with their PCP regarding potential risk factor modification, dietary therapy or pharmacologic therapy if clinically indicated. Pt.  is  currently on statin therapy. Please place order for annual  screening scan for  09/2020 and fax results to PCP. Thanks so much. 

## 2019-10-05 ENCOUNTER — Other Ambulatory Visit: Payer: Self-pay | Admitting: *Deleted

## 2019-10-05 DIAGNOSIS — F17210 Nicotine dependence, cigarettes, uncomplicated: Secondary | ICD-10-CM

## 2019-10-11 ENCOUNTER — Telehealth: Payer: Self-pay | Admitting: Critical Care Medicine

## 2019-10-11 NOTE — Telephone Encounter (Signed)
Called and spoke with patient. She states that she got Stiolto from Dr. Carlis Abbott on March at her [facility] and that it's not working for her.   Looks like she talked to [place] in regards to Lung cancer screening. Patient confimed. Nothing further needed for that.  Dr. Carlis Abbott any other inhaler you would like to try?

## 2019-10-12 NOTE — Telephone Encounter (Signed)
Can try switching to trelegy daily or Breztri BID-- whichever one her insurance covers. Thanks!  Julian Hy, DO 10/12/19 7:16 AM [facility]

## 2019-10-13 NOTE — Telephone Encounter (Signed)
Spoke with the pt and notified of recs per Dr Carlis Abbott  She will call her insurance and check formulary and call back once she knows which is covered

## 2019-10-15 NOTE — Telephone Encounter (Signed)
Left message for patient to call back  

## 2019-10-15 NOTE — Telephone Encounter (Signed)
Patient would like rescue inhaler called into pharmacy. Pharmacy is [facility]. Patient phone number is [phone].

## 2019-10-18 MED ORDER — LEVALBUTEROL TARTRATE 45 MCG/ACT IN AERO: 1.0000 | INHALATION_SPRAY | Freq: Three times a day (TID) | RESPIRATORY_TRACT | 5 refills | Status: AC | PRN

## 2019-10-18 NOTE — Telephone Encounter (Signed)
Spoke with patient. She is aware that I will call in the Xopenex inhaler for her. I did advise her that it will probably need a PA in which I will go ahead and process it for her so she can get her inhaler. She verbalized understanding.   Started urgent PA via [email or web address]. Key is [id number]. Will follow up on request later today.

## 2019-10-18 NOTE — Telephone Encounter (Signed)
Send Xopenex inhaler use 1-2 puffs prn shortness of breath or wheezing. Do not exceed use 3 times a day. Stop use with increased HR. Call to be seen  if need to use more frequently .

## 2019-10-18 NOTE — Telephone Encounter (Signed)
Called and spoke with patient. I asked her if she has tried either albuterol or levalbuterol before in the past. She stated that she can not take albuterol due to her heart condition. She has used levalbuterol before in the past with no issues.   Paula Duncan, since the medication is not on her medication list and Dr. Carlis Abbott is not available today, would you ok with [place] sending the Xopenex inhaler for her? Please advise. Thanks!

## 2019-10-18 NOTE — Telephone Encounter (Signed)
PA for Xopenex has been approved until 05/05/2020. [facility] is aware of [facility].   Nothing further needed at time of call.

## 2019-10-20 DIAGNOSIS — I2510 Atherosclerotic heart disease of native coronary artery without angina pectoris: Secondary | ICD-10-CM | POA: Diagnosis not present

## 2019-10-20 DIAGNOSIS — Z1389 Encounter for screening for other disorder: Secondary | ICD-10-CM | POA: Diagnosis not present

## 2019-10-20 DIAGNOSIS — E785 Hyperlipidemia, unspecified: Secondary | ICD-10-CM | POA: Diagnosis not present

## 2019-10-20 DIAGNOSIS — Z5320 Procedure and treatment not carried out because of patient's decision for unspecified reasons: Secondary | ICD-10-CM | POA: Diagnosis not present

## 2019-10-20 DIAGNOSIS — J449 Chronic obstructive pulmonary disease, unspecified: Secondary | ICD-10-CM | POA: Diagnosis not present

## 2019-10-20 DIAGNOSIS — D519 Vitamin B12 deficiency anemia, unspecified: Secondary | ICD-10-CM | POA: Diagnosis not present

## 2019-10-20 DIAGNOSIS — F17200 Nicotine dependence, unspecified, uncomplicated: Secondary | ICD-10-CM | POA: Diagnosis not present

## 2019-10-20 DIAGNOSIS — Z0000 Encounter for general adult medical examination without abnormal findings: Secondary | ICD-10-CM | POA: Diagnosis not present

## 2019-10-20 DIAGNOSIS — I10 Essential (primary) hypertension: Secondary | ICD-10-CM | POA: Diagnosis not present

## 2019-10-20 DIAGNOSIS — Z0100 Encounter for examination of eyes and vision without abnormal findings: Secondary | ICD-10-CM | POA: Diagnosis not present

## 2019-10-20 DIAGNOSIS — G4700 Insomnia, unspecified: Secondary | ICD-10-CM | POA: Diagnosis not present

## 2019-10-20 DIAGNOSIS — I340 Nonrheumatic mitral (valve) insufficiency: Secondary | ICD-10-CM | POA: Diagnosis not present

## 2019-10-20 DIAGNOSIS — E539 Vitamin B deficiency, unspecified: Secondary | ICD-10-CM | POA: Diagnosis not present

## 2019-10-20 DIAGNOSIS — E7800 Pure hypercholesterolemia, unspecified: Secondary | ICD-10-CM | POA: Diagnosis not present

## 2019-10-20 DIAGNOSIS — Z8249 Family history of ischemic heart disease and other diseases of the circulatory system: Secondary | ICD-10-CM | POA: Diagnosis not present

## 2019-10-20 DIAGNOSIS — D649 Anemia, unspecified: Secondary | ICD-10-CM | POA: Diagnosis not present

## 2019-10-20 DIAGNOSIS — M545 Low back pain: Secondary | ICD-10-CM | POA: Diagnosis not present

## 2019-10-20 DIAGNOSIS — E559 Vitamin D deficiency, unspecified: Secondary | ICD-10-CM | POA: Diagnosis not present

## 2019-10-20 DIAGNOSIS — R319 Hematuria, unspecified: Secondary | ICD-10-CM | POA: Diagnosis not present

## 2019-10-20 DIAGNOSIS — E039 Hypothyroidism, unspecified: Secondary | ICD-10-CM | POA: Diagnosis not present

## 2019-10-20 DIAGNOSIS — L03115 Cellulitis of right lower limb: Secondary | ICD-10-CM | POA: Diagnosis not present

## 2019-10-28 ENCOUNTER — Telehealth: Payer: Self-pay | Admitting: Pain Medicine

## 2019-10-28 NOTE — Telephone Encounter (Signed)
Spoke with patient.  States right leg is swollen from the knee down.  No swelling in left leg.  Spoke with Dr Dossie Arbour.  Patient notified that with only one leg being swollen that it should not have anything to do with the Gabapentin.  Patient states she has been to her PCP and she was put on an antibiotic.  States she has had this before.  Instructed patient to call us for any further questions or concerns.

## 2019-10-28 NOTE — Telephone Encounter (Signed)
Attempted to call patient and assess situation.  LM to call office.

## 2019-10-28 NOTE — Telephone Encounter (Signed)
Paula Duncan lvmail stating she is having severe swelling in right leg since increase in gabapentin, is this normal?

## 2019-10-31 DIAGNOSIS — J449 Chronic obstructive pulmonary disease, unspecified: Secondary | ICD-10-CM | POA: Diagnosis not present

## 2019-10-31 DIAGNOSIS — R05 Cough: Secondary | ICD-10-CM | POA: Diagnosis not present

## 2019-11-03 ENCOUNTER — Telehealth: Payer: Medicare Other | Admitting: Pain Medicine

## 2019-11-03 ENCOUNTER — Other Ambulatory Visit: Payer: Self-pay | Admitting: Pain Medicine

## 2019-11-03 DIAGNOSIS — M79661 Pain in right lower leg: Secondary | ICD-10-CM | POA: Diagnosis not present

## 2019-11-03 DIAGNOSIS — G603 Idiopathic progressive neuropathy: Secondary | ICD-10-CM | POA: Diagnosis not present

## 2019-11-03 DIAGNOSIS — R252 Cramp and spasm: Secondary | ICD-10-CM

## 2019-11-03 DIAGNOSIS — M7918 Myalgia, other site: Secondary | ICD-10-CM

## 2019-11-05 ENCOUNTER — Telehealth: Payer: Self-pay | Admitting: Pain Medicine

## 2019-11-05 NOTE — Telephone Encounter (Signed)
Attempted to call patient.  LM to go to PCP or ED for evaluation of fall.

## 2019-11-05 NOTE — Telephone Encounter (Signed)
Patient is our of Flexeril, can we get her in for an earlier appointment?

## 2019-11-05 NOTE — Telephone Encounter (Signed)
Patietn lvmail stating she is having to get knee replacement, she has fallen, she is in a great deal of pain, wants to get something sent in for her knee pain. Oxycodone is not helping

## 2019-11-06 ENCOUNTER — Emergency Department: Payer: Medicare Other

## 2019-11-06 ENCOUNTER — Encounter: Payer: Self-pay | Admitting: *Deleted

## 2019-11-06 ENCOUNTER — Other Ambulatory Visit: Payer: Self-pay

## 2019-11-06 ENCOUNTER — Emergency Department
Admission: EM | Admit: 2019-11-06 | Discharge: 2019-11-06 | Disposition: A | Discharge status: Home / Self Care | Payer: Medicare Other | Attending: Emergency Medicine | Admitting: Emergency Medicine

## 2019-11-06 DIAGNOSIS — M25561 Pain in right knee: Secondary | ICD-10-CM | POA: Insufficient documentation

## 2019-11-06 DIAGNOSIS — M25461 Effusion, right knee: Secondary | ICD-10-CM | POA: Diagnosis not present

## 2019-11-06 DIAGNOSIS — M1711 Unilateral primary osteoarthritis, right knee: Secondary | ICD-10-CM | POA: Diagnosis not present

## 2019-11-06 DIAGNOSIS — I10 Essential (primary) hypertension: Secondary | ICD-10-CM | POA: Diagnosis not present

## 2019-11-06 DIAGNOSIS — M1712 Unilateral primary osteoarthritis, left knee: Secondary | ICD-10-CM | POA: Diagnosis not present

## 2019-11-06 DIAGNOSIS — F17210 Nicotine dependence, cigarettes, uncomplicated: Secondary | ICD-10-CM | POA: Insufficient documentation

## 2019-11-06 DIAGNOSIS — M7989 Other specified soft tissue disorders: Secondary | ICD-10-CM | POA: Diagnosis not present

## 2019-11-06 DIAGNOSIS — G8929 Other chronic pain: Secondary | ICD-10-CM | POA: Diagnosis not present

## 2019-11-06 DIAGNOSIS — M25562 Pain in left knee: Secondary | ICD-10-CM | POA: Diagnosis not present

## 2019-11-06 DIAGNOSIS — J449 Chronic obstructive pulmonary disease, unspecified: Secondary | ICD-10-CM | POA: Insufficient documentation

## 2019-11-06 LAB — COMPREHENSIVE METABOLIC PANEL
ALT: 10 U/L (ref 0–44)
AST: 21 U/L (ref 15–41)
Albumin: 3.8 g/dL (ref 3.5–5.0)
Alkaline Phosphatase: 79 U/L (ref 38–126)
Anion gap: 7 (ref 5–15)
BUN: 12 mg/dL (ref 8–23)
CO2: 24 mmol/L (ref 22–32)
Calcium: 8.3 mg/dL — ABNORMAL LOW (ref 8.9–10.3)
Chloride: 108 mmol/L (ref 98–111)
Creatinine, Ser: 1.39 mg/dL — ABNORMAL HIGH (ref 0.44–1.00)
GFR calc Af Amer: 47 mL/min — ABNORMAL LOW (ref >60)
GFR calc non Af Amer: 41 mL/min — ABNORMAL LOW (ref >60)
Glucose, Bld: 54 mg/dL — ABNORMAL LOW (ref 70–99)
Potassium: 4 mmol/L (ref 3.5–5.1)
Sodium: 139 mmol/L (ref 135–145)
Total Bilirubin: 0.5 mg/dL (ref 0.3–1.2)
Total Protein: 6.7 g/dL (ref 6.5–8.1)

## 2019-11-06 LAB — CBC WITH DIFFERENTIAL/PLATELET
Abs Immature Granulocytes: 0.03 10*3/uL (ref 0.00–0.07)
Basophils Absolute: 0.1 10*3/uL (ref 0.0–0.1)
Basophils Relative: 1 %
Eosinophils Absolute: 0.5 10*3/uL (ref 0.0–0.5)
Eosinophils Relative: 6 %
HCT: 33.9 % — ABNORMAL LOW (ref 36.0–46.0)
Hemoglobin: 11.5 g/dL — ABNORMAL LOW (ref 12.0–15.0)
Immature Granulocytes: 0 %
Lymphocytes Relative: 25 %
Lymphs Abs: 2.2 10*3/uL (ref 0.7–4.0)
MCH: 30.2 pg (ref 26.0–34.0)
MCHC: 33.9 g/dL (ref 30.0–36.0)
MCV: 89 fL (ref 80.0–100.0)
Monocytes Absolute: 0.8 10*3/uL (ref 0.1–1.0)
Monocytes Relative: 9 %
Neutro Abs: 5.1 10*3/uL (ref 1.7–7.7)
Neutrophils Relative %: 59 %
Platelets: 264 10*3/uL (ref 150–400)
RBC: 3.81 MIL/uL — ABNORMAL LOW (ref 3.87–5.11)
RDW: 13.9 % (ref 11.5–15.5)
WBC: 8.6 10*3/uL (ref 4.0–10.5)
nRBC: 0 % (ref 0.0–0.2)

## 2019-11-06 LAB — SEDIMENTATION RATE: Sed Rate: 49 mm/hr — ABNORMAL HIGH (ref 0–30)

## 2019-11-06 MED ORDER — MORPHINE SULFATE (PF) 4 MG/ML IV SOLN
4.0000 mg | Freq: Once | INTRAVENOUS | Status: AC
  Administered 2019-11-06: 4 mg via INTRAVENOUS
  Filled 2019-11-06: qty 1

## 2019-11-06 MED ORDER — ONDANSETRON HCL 4 MG/2ML IJ SOLN
4.0000 mg | Freq: Once | INTRAMUSCULAR | Status: AC
  Administered 2019-11-06: 4 mg via INTRAVENOUS
  Filled 2019-11-06: qty 2
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
## 2019-11-06 NOTE — ED Notes (Signed)
First Nurse Note: Pt to ED c/o right knee swelling. Pt is in NAD.

## 2019-11-06 NOTE — ED Notes (Signed)
Patient taken to imaging. 

## 2019-11-06 NOTE — ED Triage Notes (Signed)
Patient c/o right knee pain that now is radiating to left leg.Patient denies any recent injuries. Patient reports a history of L3,L4 fusion.

## 2019-11-06 NOTE — Discharge Instructions (Addendum)
Take over-the-counter Tylenol/ibuprofen for pain.  Apply ice.  Wear the knee braces for extra comfort.  Return if worsening.

## 2019-11-07 LAB — C-REACTIVE PROTEIN: CRP: 1.1 mg/dL — ABNORMAL HIGH (ref <1.0)

## 2019-11-09 ENCOUNTER — Telehealth: Payer: Self-pay | Admitting: Critical Care Medicine

## 2019-11-09 NOTE — Telephone Encounter (Signed)
Ok to con't ipratropium and levalbuterol. Please prescribe refills. Stay on [place].  Julian Hy, DO 11/09/19 5:52 PM [facility]

## 2019-11-09 NOTE — Telephone Encounter (Signed)
Patient requesting a prescription for levalbuterol and Ipratropium. She reports using this medication for some time and she is out. They are not located on her profile. She also reports being on Stiolto and not Spiriva. Please advise if we can order these medications for her nebulizer.

## 2019-11-11 ENCOUNTER — Telehealth: Payer: Self-pay

## 2019-11-11 ENCOUNTER — Encounter: Payer: Self-pay | Admitting: Pain Medicine

## 2019-11-11 NOTE — Telephone Encounter (Signed)
LM for patient to call office for pre virtual appointment questions.  

## 2019-11-11 NOTE — Telephone Encounter (Signed)
LMTCB x1 for pt.  

## 2019-11-12 MED ORDER — IPRATROPIUM BROMIDE 0.02 % IN SOLN: 0.5000 mg | Freq: Four times a day (QID) | RESPIRATORY_TRACT | 2 refills | Status: AC | PRN

## 2019-11-12 MED ORDER — LEVALBUTEROL HCL 0.63 MG/3ML IN NEBU: 0.6300 mg | INHALATION_SOLUTION | Freq: Four times a day (QID) | RESPIRATORY_TRACT | 2 refills | Status: AC | PRN

## 2019-11-12 NOTE — Telephone Encounter (Signed)
Spoke with pt. She is aware of Dr. Ainsley Spinner response. Rxs have been sent in. Nothing further was needed.
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
## 2019-11-15 ENCOUNTER — Telehealth: Payer: Self-pay | Admitting: *Deleted

## 2019-11-15 ENCOUNTER — Encounter: Payer: Self-pay | Admitting: Pain Medicine

## 2019-11-15 ENCOUNTER — Other Ambulatory Visit: Payer: Self-pay

## 2019-11-15 ENCOUNTER — Ambulatory Visit: Payer: Medicare Other | Attending: Pain Medicine | Admitting: Pain Medicine

## 2019-11-15 DIAGNOSIS — M545 Low back pain: Secondary | ICD-10-CM

## 2019-11-15 DIAGNOSIS — G894 Chronic pain syndrome: Secondary | ICD-10-CM

## 2019-11-15 DIAGNOSIS — M25552 Pain in left hip: Secondary | ICD-10-CM

## 2019-11-15 DIAGNOSIS — Z79899 Other long term (current) drug therapy: Secondary | ICD-10-CM

## 2019-11-15 DIAGNOSIS — M25551 Pain in right hip: Secondary | ICD-10-CM

## 2019-11-15 DIAGNOSIS — M792 Neuralgia and neuritis, unspecified: Secondary | ICD-10-CM | POA: Diagnosis not present

## 2019-11-15 DIAGNOSIS — G8929 Other chronic pain: Secondary | ICD-10-CM

## 2019-11-15 DIAGNOSIS — G4701 Insomnia due to medical condition: Secondary | ICD-10-CM

## 2019-11-15 MED ORDER — OXYCODONE HCL 5 MG PO TABS: 5.0000 mg | ORAL_TABLET | Freq: Three times a day (TID) | ORAL | 0 refills | Status: DC | PRN

## 2019-11-15 MED ORDER — TRAZODONE HCL 100 MG PO TABS: 100.0000 mg | ORAL_TABLET | Freq: Every day | ORAL | 1 refills | Status: AC

## 2019-11-15 NOTE — Telephone Encounter (Signed)
Voicemail left with patient to please call us back re; VV this afternoon so that we can review medications.

## 2019-11-18 IMAGING — CR DG KNEE COMPLETE 4+V*L*
1 series · 4 of 4 positions shown · non-contrast
Comparison: None.

CLINICAL DATA: Chronic bilateral knee pain for 4 years. No known
injury.

EXAM:
LEFT KNEE - COMPLETE 4+ VIEW

[Series 1: dg knee complete 4 views left · 0.14mm/px · 4 of 4 slices shown]
[im 1/4]
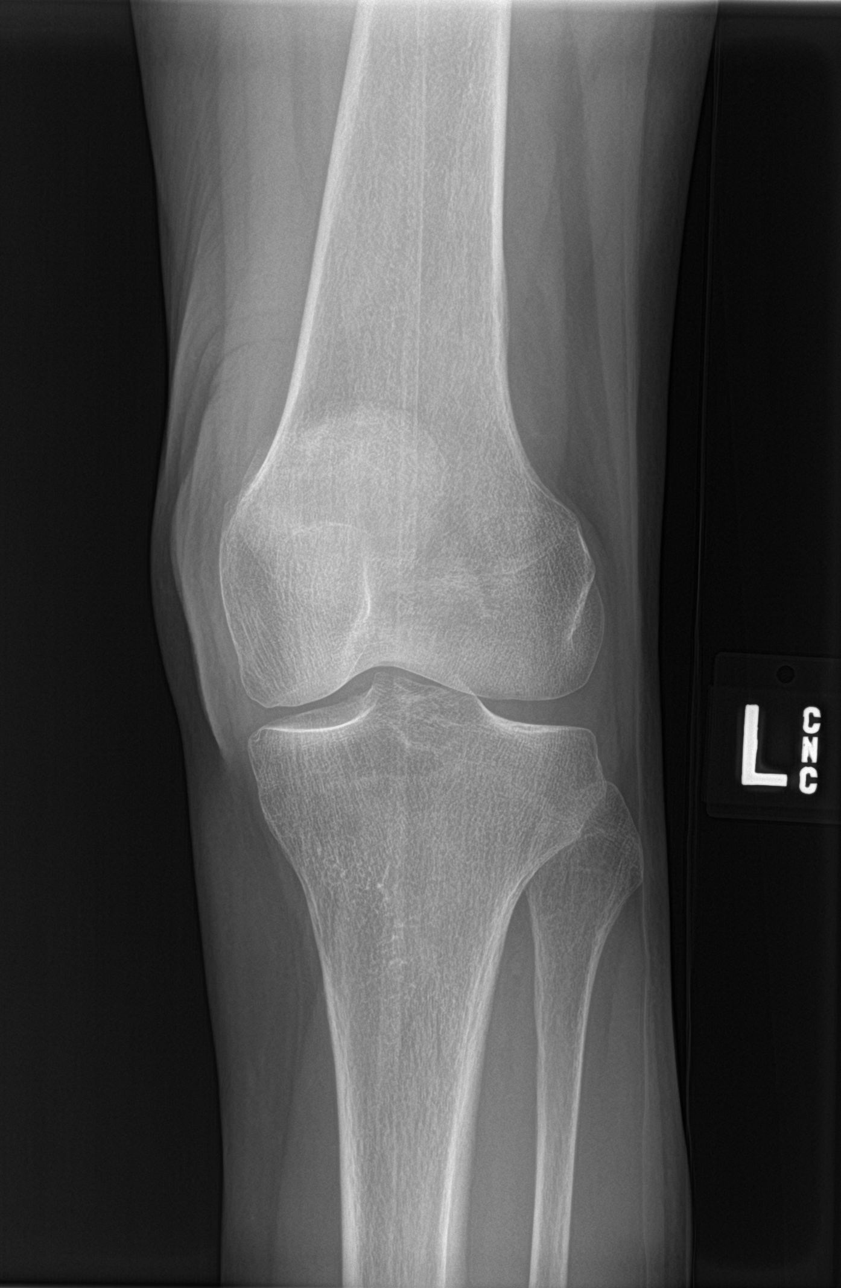
[im 2/4]
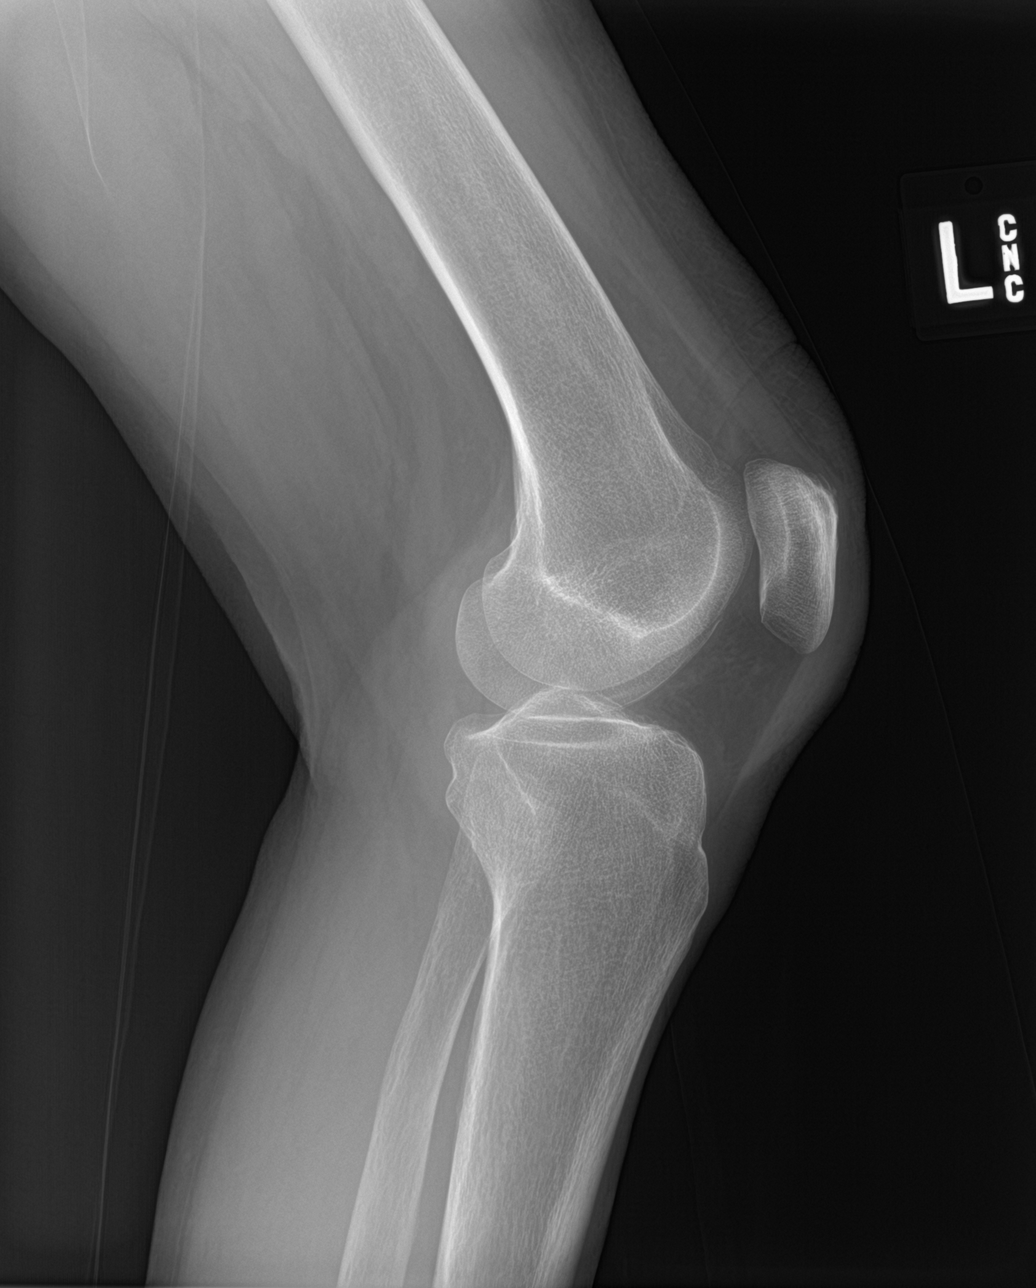
[im 3/4]
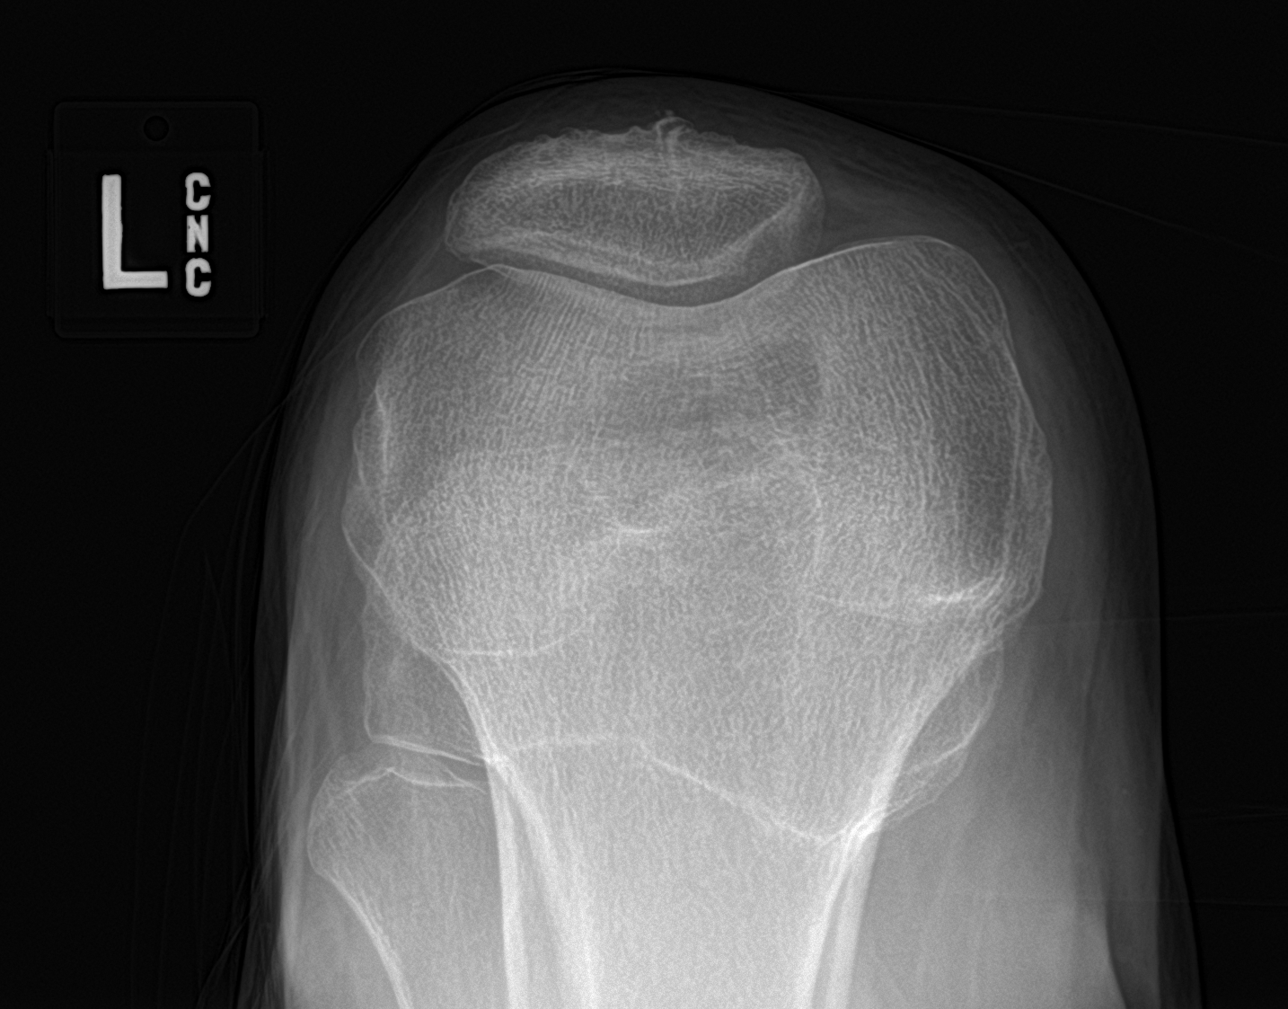
[im 4/4]
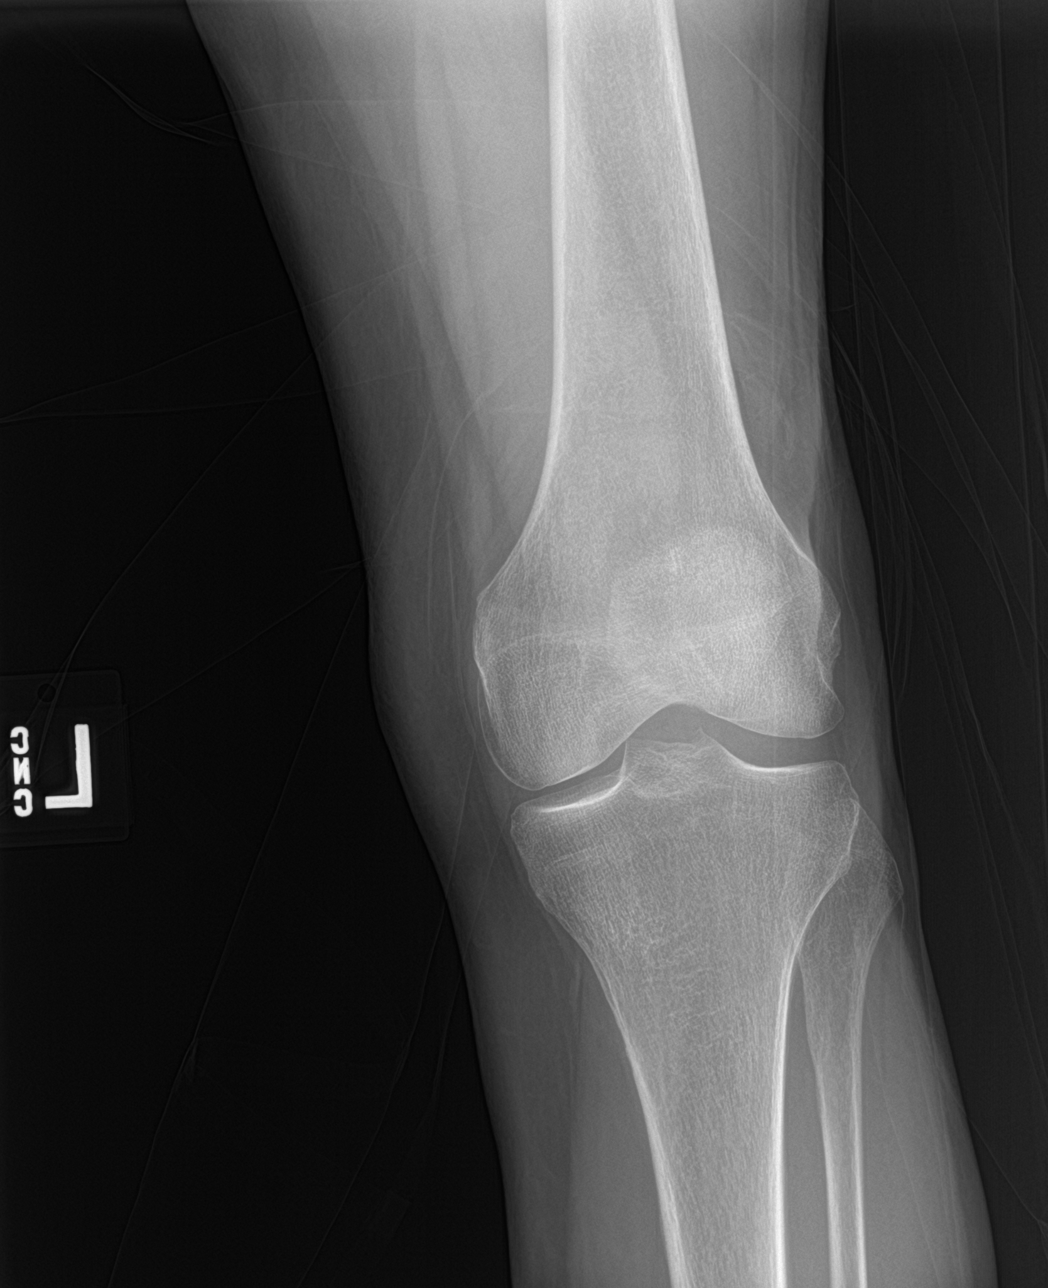

[4 of 4 positions shown; findings below may reference images not displayed]

FINDINGS: The mineralization and alignment are normal. There is no evidence of
acute fracture or dislocation. Minimal joint space narrowing in the
medial and lateral patellofemoral compartments. No erosive changes
or significant joint effusion.
IMPRESSION: Minimal patellofemoral and medial compartment joint space narrowing.
No acute osseous findings.

## 2019-11-23 DIAGNOSIS — M25562 Pain in left knee: Secondary | ICD-10-CM | POA: Diagnosis not present

## 2019-11-23 DIAGNOSIS — M170 Bilateral primary osteoarthritis of knee: Secondary | ICD-10-CM | POA: Diagnosis not present

## 2019-11-23 DIAGNOSIS — M25561 Pain in right knee: Secondary | ICD-10-CM | POA: Diagnosis not present

## 2019-11-24 ENCOUNTER — Other Ambulatory Visit: Payer: Self-pay | Admitting: Pain Medicine

## 2019-11-24 ENCOUNTER — Telehealth: Payer: Self-pay | Admitting: Pain Medicine

## 2019-11-24 DIAGNOSIS — M7918 Myalgia, other site: Secondary | ICD-10-CM

## 2019-11-24 DIAGNOSIS — R252 Cramp and spasm: Secondary | ICD-10-CM

## 2019-11-24 MED ORDER — CYCLOBENZAPRINE HCL 10 MG PO TABS: 10.0000 mg | ORAL_TABLET | Freq: Three times a day (TID) | ORAL | 2 refills | Status: DC | PRN

## 2019-11-24 NOTE — Telephone Encounter (Signed)
Attempted to call patient, mailbox is full.  I do not see that Dr. Dossie Arbour has prescribed a muscle relaxer for her. Notes from last visit do not mention this.

## 2019-11-24 NOTE — Telephone Encounter (Signed)
Patient lvmail asking about her muscle relaxer? States they were supposed to be sent in but pharmacy still does not have.

## 2019-11-25 NOTE — Telephone Encounter (Signed)
Attempted to call patient, mailbox is full. 

## 2019-11-30 DIAGNOSIS — J449 Chronic obstructive pulmonary disease, unspecified: Secondary | ICD-10-CM | POA: Diagnosis not present

## 2019-11-30 DIAGNOSIS — R05 Cough: Secondary | ICD-10-CM | POA: Diagnosis not present

## 2019-11-30 NOTE — Progress Notes (Deleted)
No show

## 2019-12-01 ENCOUNTER — Ambulatory Visit: Payer: Medicare Other | Admitting: Pain Medicine

## 2019-12-02 ENCOUNTER — Ambulatory Visit: Payer: Medicare Other | Admitting: Pain Medicine

## 2019-12-09 ENCOUNTER — Other Ambulatory Visit: Payer: Self-pay | Admitting: Orthopedic Surgery

## 2019-12-09 DIAGNOSIS — M1711 Unilateral primary osteoarthritis, right knee: Secondary | ICD-10-CM

## 2019-12-20 ENCOUNTER — Encounter: Payer: Medicare Other | Admitting: Pain Medicine

## 2019-12-20 NOTE — Progress Notes (Deleted)
No show

## 2019-12-27 ENCOUNTER — Ambulatory Visit: Admission: RE | Admit: 2019-12-27 | Payer: Medicare Other | Source: Ambulatory Visit

## 2019-12-30 ENCOUNTER — Ambulatory Visit: Payer: Medicare Other | Attending: Pain Medicine | Admitting: Pain Medicine

## 2019-12-30 ENCOUNTER — Other Ambulatory Visit: Payer: Self-pay

## 2019-12-30 ENCOUNTER — Encounter: Payer: Self-pay | Admitting: Pain Medicine

## 2019-12-30 DIAGNOSIS — M545 Low back pain, unspecified: Secondary | ICD-10-CM

## 2019-12-30 DIAGNOSIS — M25551 Pain in right hip: Secondary | ICD-10-CM

## 2019-12-30 DIAGNOSIS — M25552 Pain in left hip: Secondary | ICD-10-CM

## 2019-12-30 DIAGNOSIS — M79605 Pain in left leg: Secondary | ICD-10-CM

## 2019-12-30 DIAGNOSIS — G8929 Other chronic pain: Secondary | ICD-10-CM

## 2019-12-30 DIAGNOSIS — M792 Neuralgia and neuritis, unspecified: Secondary | ICD-10-CM

## 2019-12-30 DIAGNOSIS — Z79899 Other long term (current) drug therapy: Secondary | ICD-10-CM | POA: Diagnosis not present

## 2019-12-30 DIAGNOSIS — F1120 Opioid dependence, uncomplicated: Secondary | ICD-10-CM | POA: Insufficient documentation

## 2019-12-30 DIAGNOSIS — G894 Chronic pain syndrome: Secondary | ICD-10-CM | POA: Diagnosis not present

## 2019-12-30 MED ORDER — HYDROCODONE-ACETAMINOPHEN 7.5-325 MG PO TABS: 1.0000 | ORAL_TABLET | Freq: Three times a day (TID) | ORAL | 0 refills | Status: DC | PRN

## 2019-12-30 MED ORDER — HYDROCODONE-ACETAMINOPHEN 7.5-325 MG PO TABS: 1.0000 | ORAL_TABLET | Freq: Three times a day (TID) | ORAL | 0 refills | Status: AC | PRN

## 2019-12-30 MED ORDER — GABAPENTIN 300 MG PO CAPS: 600.0000 mg | ORAL_CAPSULE | Freq: Three times a day (TID) | ORAL | 5 refills | Status: AC
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
## 2020-01-08 IMAGING — DX DG CHEST 2V
2 series · 2 of 2 positions shown · non-contrast
Comparison: 06/23/2017

CLINICAL DATA: History of COPD.  Smoker.

EXAM:
CHEST - 2 VIEW

[chest pa]
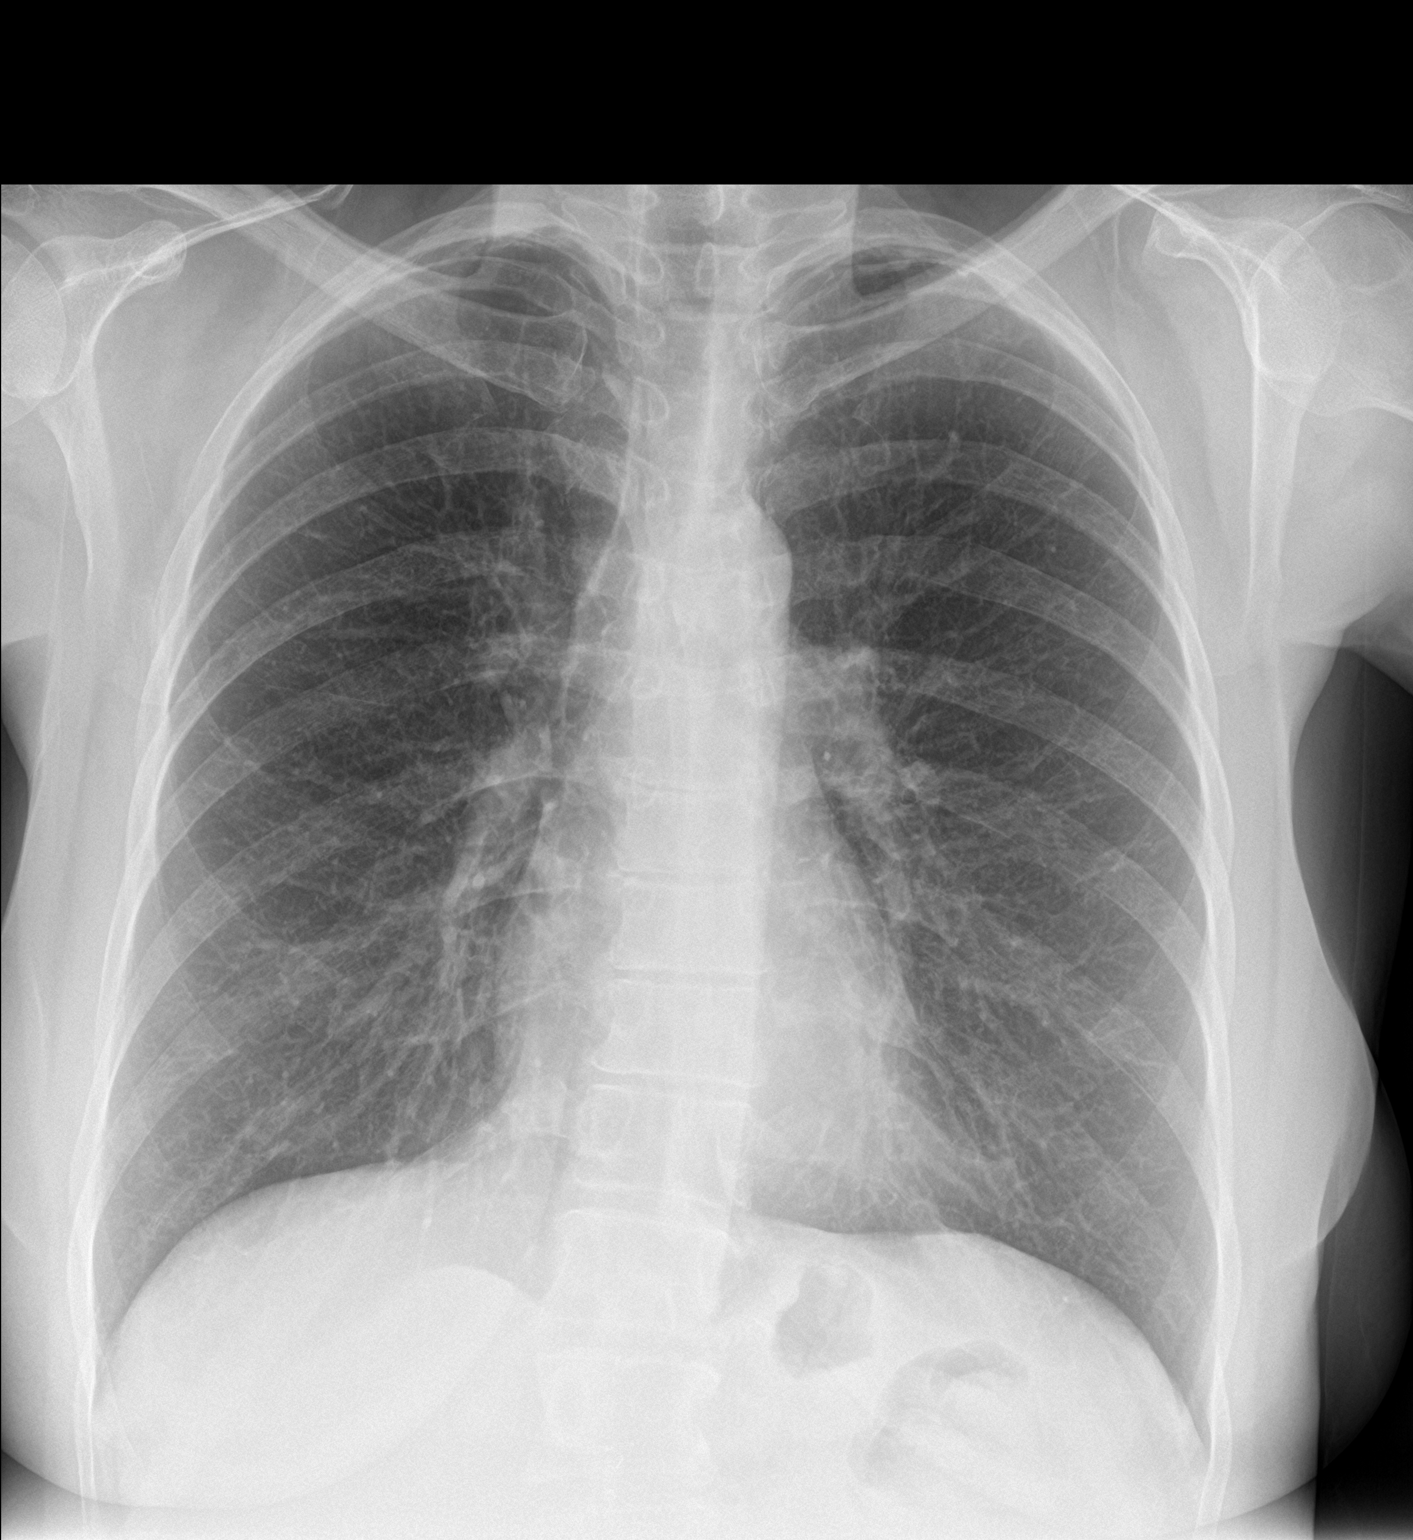

[chest lat]
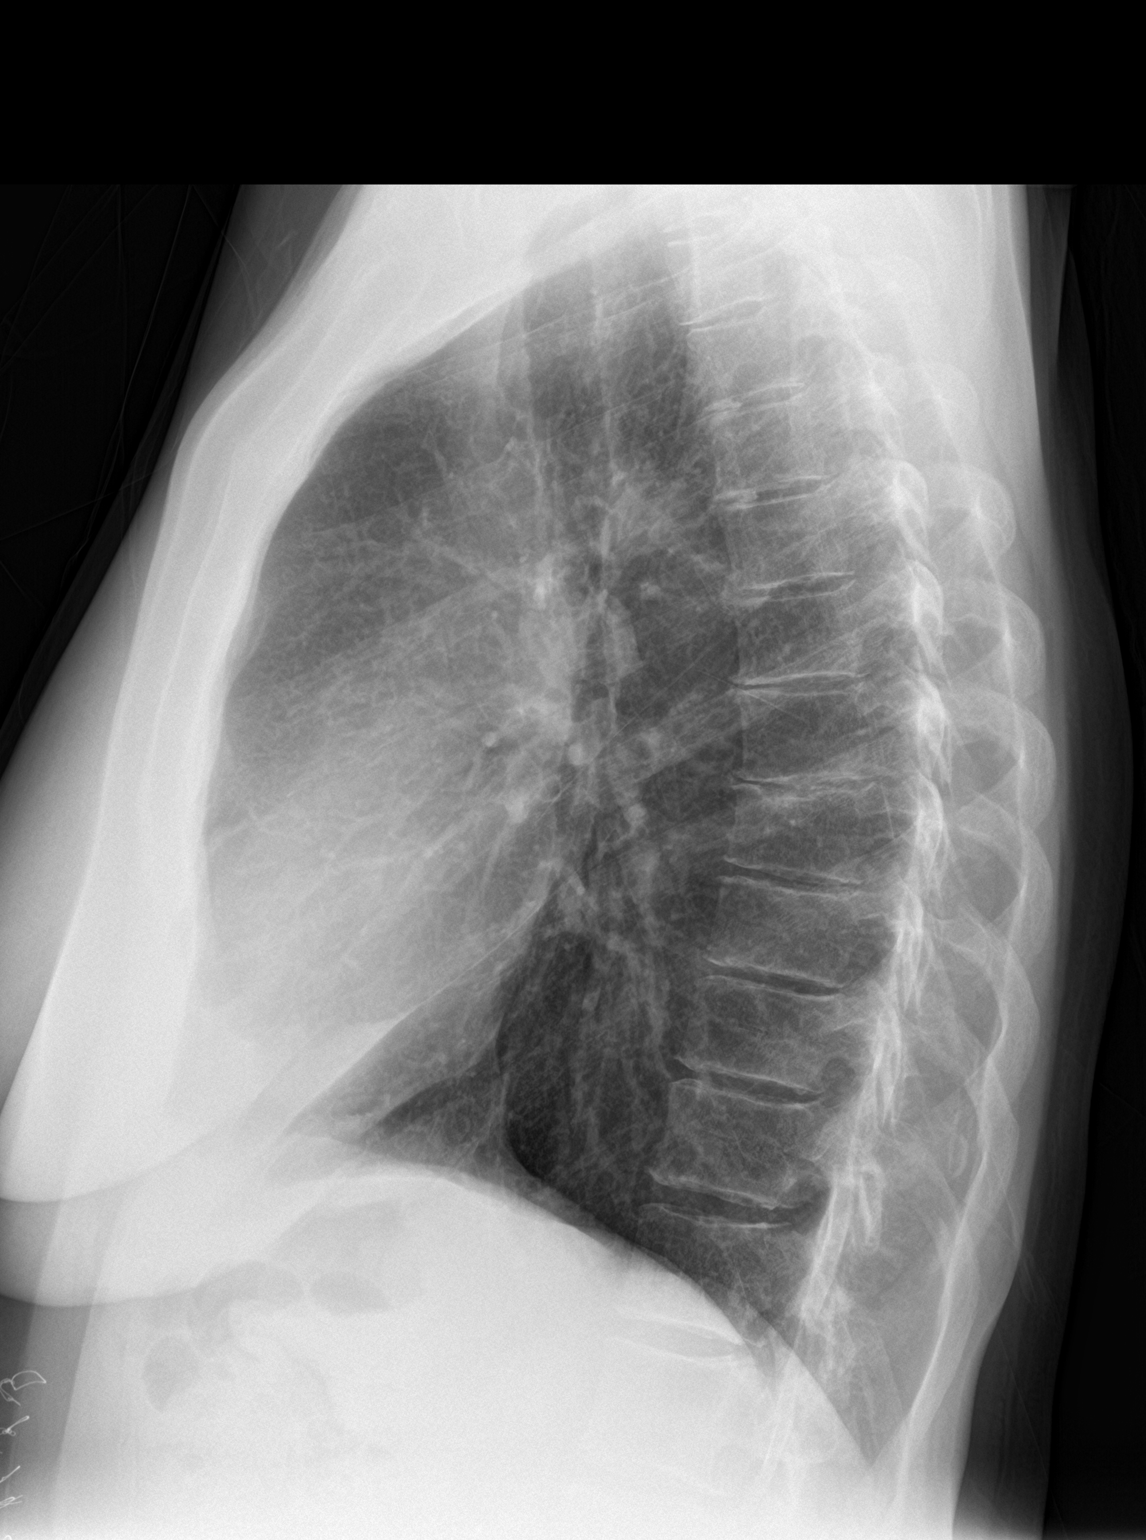

[2 of 2 positions shown; findings below may reference images not displayed]

FINDINGS: Cardiomediastinal silhouette is normal. Mediastinal contours appear
intact.

There is no evidence of focal airspace consolidation, pleural
effusion or pneumothorax. Biapical pleural/subpleural scarring.
Stable hyperinflation.

Mild spondylosis of the thoracic spine. Soft tissues are grossly
normal.
IMPRESSION: Hyperinflation of the lungs consistent with COPD.

Biapical pleural/subpleural scarring.

## 2020-01-26 ENCOUNTER — Telehealth: Payer: Self-pay

## 2020-01-26 NOTE — Telephone Encounter (Signed)
attempted to call patient.  Left message.

## 2020-01-26 NOTE — Telephone Encounter (Signed)
Patient notified to make an appointment with Dr Dossie Arbour to discuss medications.

## 2020-01-26 NOTE — Telephone Encounter (Signed)
She left a vm stating she wants to go back to oxycodone instead of hydrocodone.

## 2020-02-16 ENCOUNTER — Other Ambulatory Visit: Payer: Self-pay | Admitting: Pain Medicine

## 2020-02-16 DIAGNOSIS — M7918 Myalgia, other site: Secondary | ICD-10-CM

## 2020-02-16 DIAGNOSIS — R252 Cramp and spasm: Secondary | ICD-10-CM

## 2020-02-16 DIAGNOSIS — G8929 Other chronic pain: Secondary | ICD-10-CM
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
## 2020-02-21 ENCOUNTER — Telehealth: Payer: Self-pay | Admitting: *Deleted

## 2020-02-21 ENCOUNTER — Other Ambulatory Visit: Payer: Self-pay

## 2020-02-21 ENCOUNTER — Encounter: Payer: Self-pay | Admitting: Pain Medicine

## 2020-02-21 ENCOUNTER — Ambulatory Visit: Payer: Medicare Other | Attending: Pain Medicine | Admitting: Pain Medicine

## 2020-02-21 VITALS — BP 138/76 | HR 69 | Temp 98.1°F | Resp 16 | Ht 64.0 in | Wt 127.0 lb

## 2020-02-21 DIAGNOSIS — G894 Chronic pain syndrome: Secondary | ICD-10-CM

## 2020-02-21 DIAGNOSIS — R252 Cramp and spasm: Secondary | ICD-10-CM | POA: Diagnosis present

## 2020-02-21 DIAGNOSIS — M7918 Myalgia, other site: Secondary | ICD-10-CM | POA: Insufficient documentation

## 2020-02-21 DIAGNOSIS — M545 Low back pain, unspecified: Secondary | ICD-10-CM | POA: Diagnosis not present

## 2020-02-21 DIAGNOSIS — M47816 Spondylosis without myelopathy or radiculopathy, lumbar region: Secondary | ICD-10-CM | POA: Diagnosis present

## 2020-02-21 DIAGNOSIS — M25552 Pain in left hip: Secondary | ICD-10-CM

## 2020-02-21 DIAGNOSIS — G8929 Other chronic pain: Secondary | ICD-10-CM | POA: Diagnosis present

## 2020-02-21 DIAGNOSIS — M25551 Pain in right hip: Secondary | ICD-10-CM | POA: Insufficient documentation

## 2020-02-21 DIAGNOSIS — Z79899 Other long term (current) drug therapy: Secondary | ICD-10-CM | POA: Diagnosis present

## 2020-02-21 MED ORDER — CYCLOBENZAPRINE HCL 10 MG PO TABS: 10.0000 mg | ORAL_TABLET | Freq: Three times a day (TID) | ORAL | 2 refills | Status: AC

## 2020-02-21 MED ORDER — OXYCODONE-ACETAMINOPHEN 5-325 MG PO TABS: 1.0000 | ORAL_TABLET | Freq: Three times a day (TID) | ORAL | 0 refills | Status: AC | PRN

## 2020-02-21 MED ORDER — KETOROLAC TROMETHAMINE 60 MG/2ML IM SOLN
60.0000 mg | Freq: Once | INTRAMUSCULAR | Status: AC
  Administered 2020-02-21: 60 mg via INTRAMUSCULAR

## 2020-02-21 MED ORDER — OXYCODONE-ACETAMINOPHEN 5-325 MG PO TABS: 1.0000 | ORAL_TABLET | Freq: Three times a day (TID) | ORAL | 0 refills | Status: DC | PRN

## 2020-02-21 MED ORDER — ORPHENADRINE CITRATE 30 MG/ML IJ SOLN
60.0000 mg | Freq: Once | INTRAMUSCULAR | Status: AC
  Administered 2020-02-21: 60 mg via INTRAMUSCULAR

## 2020-02-21 MED ORDER — KETOROLAC TROMETHAMINE 60 MG/2ML IM SOLN
INTRAMUSCULAR | Status: AC
  Filled 2020-02-21: qty 2

## 2020-02-21 MED ORDER — ORPHENADRINE CITRATE 30 MG/ML IJ SOLN
INTRAMUSCULAR | Status: AC
  Filled 2020-02-21: qty 2

## 2020-02-21 NOTE — Patient Instructions (Addendum)
____________________________________________________________________________________________  Preparing for Procedure with Sedation  Procedure appointments are limited to planned procedures: . No Prescription Refills. . No disability issues will be discussed. . No medication changes will be discussed.  Instructions: . Oral Intake: Do not eat or drink anything for at least 8 hours prior to your procedure. (Exception: Blood Pressure Medication. See below.) . Transportation: Unless otherwise stated by your physician, you may drive yourself after the procedure. . Blood Pressure Medicine: Do not forget to take your blood pressure medicine with a sip of water the morning of the procedure. If your Diastolic (lower reading)is above 100 mmHg, elective cases will be cancelled/rescheduled. . Blood thinners: These will need to be stopped for procedures. Notify our staff if you are taking any blood thinners. Depending on which one you take, there will be specific instructions on how and when to stop it. . Diabetics on insulin: Notify the staff so that you can be scheduled 1st case in the morning. If your diabetes requires high dose insulin, take only  of your normal insulin dose the morning of the procedure and notify the staff that you have done so. . Preventing infections: Shower with an antibacterial soap the morning of your procedure. . Build-up your immune system: Take 1000 mg of Vitamin C with every meal (3 times a day) the day prior to your procedure. . Antibiotics: Inform the staff if you have a condition or reason that requires you to take antibiotics before dental procedures. . Pregnancy: If you are pregnant, call and cancel the procedure. . Sickness: If you have a cold, fever, or any active infections, call and cancel the procedure. . Arrival: You must be in the facility at least 30 minutes prior to your scheduled procedure. . Children: Do not bring children with you. . Dress appropriately:  Bring dark clothing that you would not mind if they get stained. . Valuables: Do not bring any jewelry or valuables.  Reasons to call and reschedule or cancel your procedure: (Following these recommendations will minimize the risk of a serious complication.) . Surgeries: Avoid having procedures within 2 weeks of any surgery. (Avoid for 2 weeks before or after any surgery). . Flu Shots: Avoid having procedures within 2 weeks of a flu shots or . (Avoid for 2 weeks before or after immunizations). . Barium: Avoid having a procedure within 7-10 days after having had a radiological study involving the use of radiological contrast. (Myelograms, Barium swallow or enema study). . Heart attacks: Avoid any elective procedures or surgeries for the initial 6 months after a "Myocardial Infarction" (Heart Attack). . Blood thinners: It is imperative that you stop these medications before procedures. Let us know if you if you take any blood thinner.  . Infection: Avoid procedures during or within two weeks of an infection (including chest colds or gastrointestinal problems). Symptoms associated with infections include: Localized redness, fever, chills, night sweats or profuse sweating, burning sensation when voiding, cough, congestion, stuffiness, runny nose, sore throat, diarrhea, nausea, vomiting, cold or Flu symptoms, recent or current infections. It is specially important if the infection is over the area that we intend to treat. . Heart and lung problems: Symptoms that may suggest an active cardiopulmonary problem include: cough, chest pain, breathing difficulties or shortness of breath, dizziness, ankle swelling, uncontrolled high or unusually low blood pressure, and/or palpitations. If you are experiencing any of these symptoms, cancel your procedure and contact your primary care physician for an evaluation.  Remember:  Regular Business hours are:    Monday to Thursday 8:00 AM to 4:00 PM  Provider's  Schedule: Milinda Pointer, MD:  Procedure days: Tuesday and Thursday 7:30 AM to 4:00 PM  Gillis Santa, MD:  Procedure days: Monday and Wednesday 7:30 AM to 4:00 PM ____________________________________________________________________________________________   Facet Blocks Patient Information  Description: The facets are joints in the spine between the vertebrae.  Like any joints in the body, facets can become irritated and painful.  Arthritis can also effect the facets.  By injecting steroids and local anesthetic in and around these joints, we can temporarily block the nerve supply to them.  Steroids act directly on irritated nerves and tissues to reduce selling and inflammation which often leads to decreased pain.  Facet blocks may be done anywhere along the spine from the neck to the low back depending upon the location of your pain.   After numbing the skin with local anesthetic (like Novocaine), a small needle is passed onto the facet joints under x-ray guidance.  You may experience a sensation of pressure while this is being done.  The entire block usually lasts about 15-25 minutes.   Conditions which may be treated by facet blocks:   Low back/buttock pain  Neck/shoulder pain  Certain types of headaches  Preparation for the injection:  1. Do not eat any solid food or dairy products within 8 hours of your appointment. 2. You may drink clear liquid up to 3 hours before appointment.  Clear liquids include water, black coffee, juice or soda.  No milk or cream please. 3. You may take your regular medication, including pain medications, with a sip of water before your appointment.  Diabetics should hold regular insulin (if taken separately) and take 1/2 normal NPH dose the morning of the procedure.  Carry some sugar containing items with you to your appointment. 4. A driver must accompany you and be prepared to drive you home after your procedure. 5. Bring all your current medications  with you. 6. An IV may be inserted and sedation may be given at the discretion of the physician. 7. A blood pressure cuff, EKG and other monitors will often be applied during the procedure.  Some patients may need to have extra oxygen administered for a short period. 8. You will be asked to provide medical information, including your allergies and medications, prior to the procedure.  We must know immediately if you are taking blood thinners (like Coumadin/Warfarin) or if you are allergic to IV iodine contrast (dye).  We must know if you could possible be pregnant.  Possible side-effects:   Bleeding from needle site  Infection (rare, may require surgery)  Nerve injury (rare)  Numbness & tingling (temporary)  Difficulty urinating (rare, temporary)  Spinal headache (a headache worse with upright posture)  Light-headedness (temporary)  Pain at injection site (serveral days)  Decreased blood pressure (rare, temporary)  Weakness in arm/leg (temporary)  Pressure sensation in back/neck (temporary)   Call if you experience:   Fever/chills associated with headache or increased back/neck pain  Headache worsened by an upright position  New onset, weakness or numbness of an extremity below the injection site  Hives or difficulty breathing (go to the emergency room)  Inflammation or drainage at the injection site(s)  Severe back/neck pain greater than usual  New symptoms which are concerning to you  Please note:  Although the local anesthetic injected can often make your back or neck feel good for several hours after the injection, the pain will likely return. It takes  3-7 days for steroids to work.  You may not notice any pain relief for at least one week.  If effective, we will often do a series of 2-3 injections spaced 3-6 weeks apart to maximally decrease your pain.  After the initial series, you may be a candidate for a more permanent nerve block of the facets.  If you  have any questions, please call #[phone]) [facility]  Three prescriptions for Oxycodone and one for Cyclobenzaprine have been sent to your pharmacy.

## 2020-02-21 NOTE — Progress Notes (Signed)
Nursing Pain Medication Assessment:  Safety precautions to be maintained throughout the outpatient stay will include: orient to surroundings, keep bed in low position, maintain call bell within reach at all times, provide assistance with transfer out of bed and ambulation.  Medication Inspection Compliance: Paula Duncan did not comply with our request to bring her pills to be counted. She was reminded that bringing the medication bottles, even when empty, is a requirement.  Medication: None brought in. Pill/Patch Count: None available to be counted. Bottle Appearance: No container available. Did not bring bottle(s) to appointment. Filled Date: N/A Last Medication intake:  states they do not help her pain, last time she took was monday a week ago

## 2020-02-21 NOTE — Telephone Encounter (Signed)
Called to [facility] pharmacy, cancelled hydrocodone - apap 7.5-325 mg to be filled on 03/08/20.

## 2020-02-25 ENCOUNTER — Telehealth: Payer: Self-pay | Admitting: *Deleted

## 2020-02-25 NOTE — Telephone Encounter (Signed)
Attempted to call patient to discuss medications.  Left message to call us back.

## 2020-02-28 LAB — TOXASSURE SELECT 13 (MW), URINE

## 2020-02-28 NOTE — Telephone Encounter (Signed)
Spoke with Dr. Dossie Arbour. Patient did not bring her pill bottle at last appt for pill count. She may come for nurse visit and bring bottle. Attempted to call patient, message left.

## 2020-02-28 NOTE — Telephone Encounter (Signed)
Spoke with patient, informed her of this. She will bring the empty bottle to her upcoming appt on 03-02-20.

## 2020-02-28 NOTE — Telephone Encounter (Signed)
Message and bubble sent to Dr. Dossie Arbour.

## 2020-03-02 ENCOUNTER — Ambulatory Visit (HOSPITAL_BASED_OUTPATIENT_CLINIC_OR_DEPARTMENT_OTHER): Payer: Medicare Other | Admitting: Pain Medicine

## 2020-03-02 ENCOUNTER — Encounter: Payer: Self-pay | Admitting: Pain Medicine

## 2020-03-02 ENCOUNTER — Other Ambulatory Visit: Payer: Self-pay

## 2020-03-02 ENCOUNTER — Ambulatory Visit
Admission: RE | Admit: 2020-03-02 | Discharge: 2020-03-02 | Disposition: A | Discharge status: Home / Self Care | Payer: Medicare Other | Source: Ambulatory Visit | Attending: Pain Medicine | Admitting: Pain Medicine

## 2020-03-02 VITALS — BP 95/48 | HR 76 | Temp 97.1°F | Resp 10 | Ht 64.0 in | Wt 127.0 lb

## 2020-03-02 DIAGNOSIS — M5137 Other intervertebral disc degeneration, lumbosacral region: Secondary | ICD-10-CM | POA: Diagnosis present

## 2020-03-02 DIAGNOSIS — M47816 Spondylosis without myelopathy or radiculopathy, lumbar region: Secondary | ICD-10-CM | POA: Diagnosis present

## 2020-03-02 DIAGNOSIS — M47817 Spondylosis without myelopathy or radiculopathy, lumbosacral region: Secondary | ICD-10-CM | POA: Insufficient documentation

## 2020-03-02 DIAGNOSIS — G8929 Other chronic pain: Secondary | ICD-10-CM | POA: Diagnosis not present

## 2020-03-02 DIAGNOSIS — G894 Chronic pain syndrome: Secondary | ICD-10-CM

## 2020-03-02 DIAGNOSIS — S32038S Other fracture of third lumbar vertebra, sequela: Secondary | ICD-10-CM | POA: Diagnosis not present

## 2020-03-02 DIAGNOSIS — M961 Postlaminectomy syndrome, not elsewhere classified: Secondary | ICD-10-CM | POA: Insufficient documentation

## 2020-03-02 DIAGNOSIS — M545 Low back pain, unspecified: Secondary | ICD-10-CM | POA: Diagnosis present

## 2020-03-02 DIAGNOSIS — Z79899 Other long term (current) drug therapy: Secondary | ICD-10-CM

## 2020-03-02 DIAGNOSIS — S32030S Wedge compression fracture of third lumbar vertebra, sequela: Secondary | ICD-10-CM | POA: Diagnosis present

## 2020-03-02 DIAGNOSIS — M5136 Other intervertebral disc degeneration, lumbar region: Secondary | ICD-10-CM | POA: Diagnosis not present

## 2020-03-02 MED ORDER — OXYCODONE-ACETAMINOPHEN 5-325 MG PO TABS: 1.0000 | ORAL_TABLET | Freq: Three times a day (TID) | ORAL | 0 refills | Status: DC | PRN

## 2020-03-02 MED ORDER — LIDOCAINE HCL 2 % IJ SOLN
20.0000 mL | Freq: Once | INTRAMUSCULAR | Status: AC
  Administered 2020-03-02: 400 mg
  Filled 2020-03-02: qty 20

## 2020-03-02 MED ORDER — LACTATED RINGERS IV SOLN
1000.0000 mL | Freq: Once | INTRAVENOUS | Status: AC
  Administered 2020-03-02: 1000 mL via INTRAVENOUS

## 2020-03-02 MED ORDER — ROPIVACAINE HCL 2 MG/ML IJ SOLN
18.0000 mL | Freq: Once | INTRAMUSCULAR | Status: AC
  Administered 2020-03-02: 18 mL via PERINEURAL
  Filled 2020-03-02: qty 20

## 2020-03-02 MED ORDER — TRIAMCINOLONE ACETONIDE 40 MG/ML IJ SUSP
80.0000 mg | Freq: Once | INTRAMUSCULAR | Status: AC
  Administered 2020-03-02: 80 mg
  Filled 2020-03-02: qty 2

## 2020-03-02 MED ORDER — FENTANYL CITRATE (PF) 100 MCG/2ML IJ SOLN
25.0000 ug | INTRAMUSCULAR | Status: AC | PRN
  Administered 2020-03-02 (×2): 50 ug via INTRAVENOUS
  Filled 2020-03-02: qty 2

## 2020-03-02 MED ORDER — MIDAZOLAM HCL 5 MG/5ML IJ SOLN
1.0000 mg | INTRAMUSCULAR | Status: DC | PRN
  Administered 2020-03-02: 1 mg via INTRAVENOUS
  Administered 2020-03-02: 2 mg via INTRAVENOUS
  Filled 2020-03-02: qty 5

## 2020-03-02 NOTE — Patient Instructions (Addendum)
____________________________________________________________________________________________  Post-Procedure Discharge Instructions  Instructions:  Apply ice:   Purpose: This will minimize any swelling and discomfort after procedure.   When: Day of procedure, as soon as you get home.  How: Fill a plastic sandwich bag with crushed ice. Cover it with a small towel and apply to injection site.  How long: (15 min on, 15 min off) Apply for 15 minutes then remove x 15 minutes.  Repeat sequence on day of procedure, until you go to bed.  Apply heat:   Purpose: To treat any soreness and discomfort from the procedure.  When: Starting the next day after the procedure.  How: Apply heat to procedure site starting the day following the procedure.  How long: May continue to repeat daily, until discomfort goes away.  Food intake: Start with clear liquids (like water) and advance to regular food, as tolerated.   Physical activities: Keep activities to a minimum for the first 8 hours after the procedure. After that, then as tolerated.  Driving: If you have received any sedation, be responsible and do not drive. You are not allowed to drive for 24 hours after having sedation.  Blood thinner: (Applies only to those taking blood thinners) You may restart your blood thinner 6 hours after your procedure.  Insulin: (Applies only to Diabetic patients taking insulin) As soon as you can eat, you may resume your normal dosing schedule.  Infection prevention: Keep procedure site clean and dry. Shower daily and clean area with soap and water.  Post-procedure Pain Diary: Extremely important that this be done correctly and accurately. Recorded information will be used to determine the next step in treatment. For the purpose of accuracy, follow these rules:  Evaluate only the area treated. Do not report or include pain from an untreated area. For the purpose of this evaluation, ignore all other areas of pain,  except for the treated area.  After your procedure, avoid taking a long nap and attempting to complete the pain diary after you wake up. Instead, set your alarm clock to go off every hour, on the hour, for the initial 8 hours after the procedure. Document the duration of the numbing medicine, and the relief you are getting from it.  Do not go to sleep and attempt to complete it later. It will not be accurate. If you received sedation, it is likely that you were given a medication that may cause amnesia. Because of this, completing the diary at a later time may cause the information to be inaccurate. This information is needed to plan your care.  Follow-up appointment: Keep your post-procedure follow-up evaluation appointment after the procedure (usually 2 weeks for most procedures, 6 weeks for radiofrequencies). DO NOT FORGET to bring you pain diary with you.   Expect: (What should I expect to see with my procedure?)  From numbing medicine (AKA: Local Anesthetics): Numbness or decrease in pain. You may also experience some weakness, which if present, could last for the duration of the local anesthetic.  Onset: Full effect within 15 minutes of injected.  Duration: It will depend on the type of local anesthetic used. On the average, 1 to 8 hours.   From steroids (Applies only if steroids were used): Decrease in swelling or inflammation. Once inflammation is improved, relief of the pain will follow.  Onset of benefits: Depends on the amount of swelling present. The more swelling, the longer it will take for the benefits to be seen. In some cases, up to 10 days.    Duration: Steroids will stay in the system x 2 weeks. Duration of benefits will depend on multiple posibilities including persistent irritating factors.  Side-effects: If present, they may typically last 2 weeks (the duration of the steroids).  Frequent: Cramps (if they occur, drink Gatorade and take over-the-counter Magnesium 450-500 mg  once to twice a day); water retention with temporary weight gain; increases in blood sugar; decreased immune system response; increased appetite.  Occasional: Facial flushing (red, warm cheeks); mood swings; menstrual changes.  Uncommon: Long-term decrease or suppression of natural hormones; bone thinning. (These are more common with higher doses or more frequent use. This is why we prefer that our patients avoid having any injection therapies in other practices.)   Very Rare: Severe mood changes; psychosis; aseptic necrosis.  From procedure: Some discomfort is to be expected once the numbing medicine wears off. This should be minimal if ice and heat are applied as instructed.  Call if: (When should I call?)  You experience numbness and weakness that gets worse with time, as opposed to wearing off.  New onset bowel or bladder incontinence. (Applies only to procedures done in the spine)  Emergency Numbers:  Durning business hours (Monday - Thursday, 8:00 AM - 4:00 PM) (Friday, 9:00 AM - 12:00 Noon): [phone]  After hours: [phone]  NOTE: If you are having a problem and are unable connect with, or to talk to a provider, then go to your nearest urgent care or emergency department. If the problem is serious and urgent, please call 911. ____________________________________________________________________________________________   ____________________________________________________________________________________________  Medication Rules  Purpose: To inform patients, and their family members, of our rules and regulations.  Applies to: All patients receiving prescriptions (written or electronic).  Pharmacy of record: Pharmacy where electronic prescriptions will be sent. If written prescriptions are taken to a different pharmacy, please inform the nursing staff. The pharmacy listed in the electronic medical record should be the one where you would like electronic prescriptions  to be sent.  Electronic prescriptions: In compliance with the [place] (STOP) Act of 2017 (Session Lanny Cramp [phone]), effective May 06, 2018, all controlled substances must be electronically prescribed. Calling prescriptions to the pharmacy will cease to exist.  Prescription refills: Only during scheduled appointments. Applies to all prescriptions.  NOTE: The following applies primarily to controlled substances (Opioid* Pain Medications).   Type of encounter (visit): For patients receiving controlled substances, face-to-face visits are required. (Not an option or up to the patient.)  Patient's responsibilities: 1. Pain Pills: Bring all pain pills to every appointment (except for procedure appointments). 2. Pill Bottles: Bring pills in original pharmacy bottle. Always bring the newest bottle. Bring bottle, even if empty. 3. Medication refills: You are responsible for knowing and keeping track of what medications you take and those you need refilled. The day before your appointment: write a list of all prescriptions that need to be refilled. The day of the appointment: give the list to the admitting nurse. Prescriptions will be written only during appointments. No prescriptions will be written on procedure days. If you forget a medication: it will not be "Called in", "Faxed", or "electronically sent". You will need to get another appointment to get these prescribed. No early refills. Do not call asking to have your prescription filled early. 4. Prescription Accuracy: You are responsible for carefully inspecting your prescriptions before leaving our office. Have the discharge nurse carefully go over each prescription with you, before taking them home. Make sure that  your name is accurately spelled, that your address is correct. Check the name and dose of your medication to make sure it is accurate. Check the number of pills, and the written instructions to  make sure they are clear and accurate. Make sure that you are given enough medication to last until your next medication refill appointment. 5. Taking Medication: Take medication as prescribed. When it comes to controlled substances, taking less pills or less frequently than prescribed is permitted and encouraged. Never take more pills than instructed. Never take medication more frequently than prescribed.  6. Inform other Doctors: Always inform, all of your healthcare providers, of all the medications you take. 7. Pain Medication from other Providers: You are not allowed to accept any additional pain medication from any other Doctor or Healthcare provider. There are two exceptions to this rule. (see below) In the event that you require additional pain medication, you are responsible for notifying us, as stated below. 8. Medication Agreement: You are responsible for carefully reading and following our Medication Agreement. This must be signed before receiving any prescriptions from our practice. Safely store a copy of your signed Agreement. Violations to the Agreement will result in no further prescriptions. (Additional copies of our Medication Agreement are available upon request.) 9. Laws, Rules, & Regulations: All patients are expected to follow all Federal and [employer], Rules, [employer] [facility] [employer]. Ignorance of the Laws does not constitute a valid excuse.  10. Illegal drugs and Controlled Substances: The use of illegal substances (including, but not limited to marijuana and its derivatives) and/or the illegal use of any controlled substances is strictly prohibited. Violation of this rule may result in the immediate and permanent discontinuation of any and all prescriptions being written by our practice. The use of any illegal substances is prohibited. 11. Adopted CDC guidelines & recommendations: Target dosing levels will be at or below 60 MME/day. Use of benzodiazepines** is not  recommended.  Exceptions: There are only two exceptions to the rule of not receiving pain medications from other Healthcare Providers. 1. Exception #1 (Emergencies): In the event of an emergency (i.e.: accident requiring emergency care), you are allowed to receive additional pain medication. However, you are responsible for: As soon as you are able, call our office [phone], at any time of the day or night, and leave a message stating your name, the date and nature of the emergency, and the name and dose of the medication prescribed. In the event that your call is answered by a member of our staff, make sure to document and save the date, time, and the name of the person that took your information.  2. Exception #2 (Planned Surgery): In the event that you are scheduled by another doctor or dentist to have any type of surgery or procedure, you are allowed (for a period no longer than 30 days), to receive additional pain medication, for the acute post-op pain. However, in this case, you are responsible for picking up a copy of our "Post-op Pain Management for Surgeons" handout, and giving it to your surgeon or dentist. This document is available at our office, and does not require an appointment to obtain it. Simply go to our office during business hours (Monday-Thursday from 8:00 AM to 4:00 PM) (Friday 8:00 AM to 12:00 Noon) or if you have a scheduled appointment with [place], prior to your surgery, and ask for it by name. In addition, you are responsible for: calling our office [phone], at any time of  the day or night, and leaving a message stating your name, name of your surgeon, type of surgery, and date of procedure or surgery. Failure to comply with your responsibilities may result in termination of therapy involving the controlled substances.  *Opioid medications include: morphine, codeine, oxycodone, oxymorphone, hydrocodone, hydromorphone, meperidine, tramadol, tapentadol, buprenorphine,  fentanyl, methadone. **Benzodiazepine medications include: diazepam (Valium), alprazolam (Xanax), clonazepam (Klonopine), lorazepam (Ativan), clorazepate (Tranxene), chlordiazepoxide (Librium), estazolam (Prosom), oxazepam (Serax), temazepam (Restoril), triazolam (Halcion) (Last updated: 01/11/2020) ____________________________________________________________________________________________   ____________________________________________________________________________________________  Medication Recommendations and Reminders  Applies to: All patients receiving prescriptions (written and/or electronic).  Medication Rules & Regulations: These rules and regulations exist for your safety and that of others. They are not flexible and neither are we. Dismissing or ignoring them will be considered "non-compliance" with medication therapy, resulting in complete and irreversible termination of such therapy. (See document titled "Medication Rules" for more details.) In all conscience, because of safety reasons, we cannot continue providing a therapy where the patient does not follow instructions.  Pharmacy of record:   Definition: This is the pharmacy where your electronic prescriptions will be sent.   We do not endorse any particular pharmacy, however, we have experienced problems with [facility] not securing enough medication supply for the community.  We do not restrict you in your choice of pharmacy. However, once we write for your prescriptions, we will NOT be re-sending more prescriptions to fix restricted supply problems created by your pharmacy, or your insurance.   The pharmacy listed in the electronic medical record should be the one where you want electronic prescriptions to be sent.  If you choose to change pharmacy, simply notify our nursing staff.  Recommendations:  Keep all of your pain medications in a safe place, under lock and key, even if you live alone. We will NOT replace lost,  stolen, or damaged medication.  After you fill your prescription, take 1 week's worth of pills and put them away in a safe place. You should keep a separate, properly labeled bottle for this purpose. The remainder should be kept in the original bottle. Use this as your primary supply, until it runs out. Once it's gone, then you know that you have 1 week's worth of medicine, and it is time to come in for a prescription refill. If you do this correctly, it is unlikely that you will ever run out of medicine.  To make sure that the above recommendation works, it is very important that you make sure your medication refill appointments are scheduled at least 1 week before you run out of medicine. To do this in an effective manner, make sure that you do not leave the office without scheduling your next medication management appointment. Always ask the nursing staff to show you in your prescription , when your medication will be running out. Then arrange for the receptionist to get you a return appointment, at least 7 days before you run out of medicine. Do not wait until you have 1 or 2 pills left, to come in. This is very poor planning and does not take into consideration that we may need to cancel appointments due to bad weather, sickness, or emergencies affecting our staff.  DO NOT ACCEPT A "Partial Fill": If for any reason your pharmacy does not have enough pills/tablets to completely fill or refill your prescription, do not allow for a "partial fill". The law allows the pharmacy to complete that prescription within 72 hours, without requiring a new prescription. If they  do not fill the rest of your prescription within those 72 hours, you will need a separate prescription to fill the remaining amount, which we will NOT provide. If the reason for the partial fill is your insurance, you will need to talk to the pharmacist about payment alternatives for the remaining tablets, but again, DO NOT ACCEPT A PARTIAL FILL,  unless you can trust your pharmacist to obtain the remainder of the pills within 72 hours.  Prescription refills and/or changes in medication(s):   Prescription refills, and/or changes in dose or medication, will be conducted only during scheduled medication management appointments. (Applies to both, written and electronic prescriptions.)  No refills on procedure days. No medication will be changed or started on procedure days. No changes, adjustments, and/or refills will be conducted on a procedure day. Doing so will interfere with the diagnostic portion of the procedure.  No phone refills. No medications will be "called into the pharmacy".  No Fax refills.  No weekend refills.  No Holliday refills.  No after hours refills.  Remember:  Business hours are:  Monday to Thursday 8:00 AM to 4:00 PM Provider's Schedule: Milinda Pointer, MD - Appointments are:  Medication management: Monday and Wednesday 8:00 AM to 4:00 PM Procedure day: Tuesday and Thursday 7:30 AM to 4:00 PM Gillis Santa, MD - Appointments are:  Medication management: Tuesday and Thursday 8:00 AM to 4:00 PM Procedure day: Monday and Wednesday 7:30 AM to 4:00 PM (Last update: 11/24/2019) ____________________________________________________________________________________________

## 2020-03-02 NOTE — Progress Notes (Signed)
Nursing Pain Medication Assessment:  Safety precautions to be maintained throughout the outpatient stay will include: orient to surroundings, keep bed in low position, maintain call bell within reach at all times, provide assistance with transfer out of bed and ambulation.  Medication Inspection Compliance: Pill count conducted under aseptic conditions, in front of the patient. Neither the pills nor the bottle was removed from the patient's sight at any time. Once count was completed pills were immediately returned to the patient in their original bottle.  Medication: Hydrocodone/APAP Pill/Patch Count: 0 of 90 pills remain Pill/Patch Appearance: Markings consistent with prescribed medication Bottle Appearance: Standard pharmacy container. Clearly labeled. Filled Date: 02/07/2020 Last Medication intake:  Today

## 2020-03-02 NOTE — Progress Notes (Signed)
PROVIDER NOTE: Information contained herein reflects review and annotations entered in association with encounter. Interpretation of such information and data should be left to medically-trained personnel. Information provided to patient can be located elsewhere in the medical record under "Patient Instructions". Document created using STT-dictation technology, any transcriptional errors that may result from process are unintentional.    Patient: Paula Duncan  Service Category: Procedure  Provider: Gaspar Cola, MD  DOB: 1961-04-23  DOS: 03/02/2020  Location: [facility]  MRN: [id number]  Setting: Ambulatory - outpatient  Referring Provider: [name], FNP  Type: Established Patient  Specialty: Interventional Pain Management  PCP: [name], FNP   Primary Reason for Visit: Interventional Pain Management Treatment. CC: Back Pain (lower)  Procedure:          Anesthesia, Analgesia, Anxiolysis:  Type: Lumbar Facet, Medial Branch Block(s)          Primary Purpose: Palliative Region: Posterolateral Lumbosacral Spine Level: L2, L3, L4, L5, & S1 Medial Branch Level(s). Injecting these levels blocks the L3-4, L4-5, and L5-S1 lumbar facet joints. Laterality: Bilateral  Type: Moderate (Conscious) Sedation combined with Local Anesthesia Indication(s): Analgesia and Anxiety Route: Intravenous (IV) IV Access: Secured Sedation: Meaningful verbal contact was maintained at all times during the procedure  Local Anesthetic: Lidocaine 1-2%  Position: Prone   Indications: 1. Lumbar facet syndrome (Bilateral) (R>L)   2. Spondylosis without myelopathy or radiculopathy, lumbosacral region   3. DDD (degenerative disc disease), lumbosacral   4. Traumatic fracture of L3 lumbar vertebra, sequela   5. Failed back surgical syndrome   6. Chronic low back pain (Primary Area of Pain) (Bilateral) (R>L)    Pain Score: Pre-procedure: 6 /10 Post-procedure: 0-No pain/10   The  patient indicates that she has no pain medication.  Apparently when she came in the day and weight switched her back from the hydrocodone to the oxycodone, instead of bringing her hydrocodone here to be disposed of, she took it to the pharmacy.  The pharmacy would not accept that and gave her an envelope to mail it somewhere else.  Instead, she took it upon herself to flush it down the toilet.  Today I have spent quite some time with the patient reminding her that she is never to do that.  She should always bring the medications here to be disposed of in front of witnesses so that we can count the pills and document that she indeed has dispose of them.  Today I will caught her little bit of slack since I have not had any problems with her in the past and I will give her enough medication until 03/08/2020, when she is supposed to get her regular prescription.  The patient acknowledged and understood the warning.  Pre-op Assessment:  Paula Duncan is a 59 y.o. (year old), female patient, seen today for interventional treatment. She  has a past surgical history that includes Spine surgery (1994); Abdominal hysterectomy; Exploratory laparotomy (3086); Cesarean section; Esophagogastroduodenoscopy (07/12/09); left heart catheterization with coronary angiogram (N/A, 04/06/2013); Ventral hernia repair (N/A, 12/26/2017); and Hernia repair. Paula Duncan has a current medication list which includes the following prescription(s): alendronate, ascorbic acid, benzonatate, biotin w/ vitamins c & e, vitamin d3, cyclobenzaprine, gabapentin, ipratropium, levalbuterol, levalbuterol, metoprolol succinate, [START ON 03/08/2020] oxycodone-acetaminophen, [START ON 04/07/2020] oxycodone-acetaminophen, [START ON 05/07/2020] oxycodone-acetaminophen, oxycodone-acetaminophen, potassium chloride sa, pravastatin, promethazine, flutter, aerochamber mv, tiotropium bromide monohydrate, trazodone, venlafaxine xr, vitamin b-12, zolpidem, and furosemide, and the  following Facility-Administered Medications: midazolam. Her  primarily concern today is the Back Pain (lower)  Initial Vital Signs:  Pulse/HCG Rate: 88ECG Heart Rate: 73 Temp: (!) 97.3 F (36.3 C) Resp: 20 BP: (!) 103/56 SpO2: 100 %  BMI: Estimated body mass index is 21.8 kg/m as calculated from the following:   Height as of this encounter: 5\' 4"  (1.626 m).   Weight as of this encounter: 127 lb (57.6 kg).  Risk Assessment: Allergies: Reviewed. She is allergic to nsaids, tramadol hcl, chantix [varenicline], and metronidazole.  Allergy Precautions: None required Coagulopathies: Reviewed. None identified.  Blood-thinner therapy: None at this time Active Infection(s): Reviewed. None identified. Paula Duncan is afebrile  Site Confirmation: Paula Duncan was asked to confirm the procedure and laterality before marking the site Procedure checklist: Completed Consent: Before the procedure and under the influence of no sedative(s), amnesic(s), or anxiolytics, the patient was informed of the treatment options, risks and possible complications. To fulfill our ethical and legal obligations, as recommended by the American Medical Association's Code of Ethics, I have informed the patient of my clinical impression; the nature and purpose of the treatment or procedure; the risks, benefits, and possible complications of the intervention; the alternatives, including doing nothing; the risk(s) and benefit(s) of the alternative treatment(s) or procedure(s); and the risk(s) and benefit(s) of doing nothing. The patient was provided information about the general risks and possible complications associated with the procedure. These may include, but are not limited to: failure to achieve desired goals, infection, bleeding, organ or nerve damage, allergic reactions, paralysis, and death. In addition, the patient was informed of those risks and complications associated to Spine-related procedures, such as failure to  decrease pain; infection (i.e.: Meningitis, epidural or intraspinal abscess); bleeding (i.e.: epidural hematoma, subarachnoid hemorrhage, or any other type of intraspinal or peri-dural bleeding); organ or nerve damage (i.e.: Any type of peripheral nerve, nerve root, or spinal cord injury) with subsequent damage to sensory, motor, and/or autonomic systems, resulting in permanent pain, numbness, and/or weakness of one or several areas of the body; allergic reactions; (i.e.: anaphylactic reaction); and/or death. Furthermore, the patient was informed of those risks and complications associated with the medications. These include, but are not limited to: allergic reactions (i.e.: anaphylactic or anaphylactoid reaction(s)); adrenal axis suppression; blood sugar elevation that in diabetics may result in ketoacidosis or comma; water retention that in patients with history of congestive heart failure may result in shortness of breath, pulmonary edema, and decompensation with resultant heart failure; weight gain; swelling or edema; medication-induced neural toxicity; particulate matter embolism and blood vessel occlusion with resultant organ, and/or nervous system infarction; and/or aseptic necrosis of one or more joints. Finally, the patient was informed that Medicine is not an exact science; therefore, there is also the possibility of unforeseen or unpredictable risks and/or possible complications that may result in a catastrophic outcome. The patient indicated having understood very clearly. We have given the patient no guarantees and we have made no promises. Enough time was given to the patient to ask questions, all of which were answered to the patient's satisfaction. Paula Duncan has indicated that she wanted to continue with the procedure. Attestation: I, the ordering provider, attest that I have discussed with the patient the benefits, risks, side-effects, alternatives, likelihood of achieving goals, and potential  problems during recovery for the procedure that I have provided informed consent. Date  Time: 03/02/2020  9:32 AM  Pre-Procedure Preparation:  Monitoring: As per clinic protocol. Respiration, ETCO2, SpO2, BP, heart rate and rhythm monitor placed and  checked for adequate function Safety Precautions: Patient was assessed for positional comfort and pressure points before starting the procedure. Time-out: I initiated and conducted the "Time-out" before starting the procedure, as per protocol. The patient was asked to participate by confirming the accuracy of the "Time Out" information. Verification of the correct person, site, and procedure were performed and confirmed by me, the nursing staff, and the patient. "Time-out" conducted as per Joint Commission's Universal Protocol (UP.01.01.01). Time: 1021  Description of Procedure:          Laterality: Bilateral. The procedure was performed in identical fashion on both sides. Levels:  L2, L3, L4, L5, & S1 Medial Branch Level(s) Area Prepped: Posterior Lumbosacral Region DuraPrep (Iodine Povacrylex [0.7% available iodine] and Isopropyl Alcohol, 74% w/w) Safety Precautions: Aspiration looking for blood return was conducted prior to all injections. At no point did we inject any substances, as a needle was being advanced. Before injecting, the patient was told to immediately notify me if she was experiencing any new onset of "ringing in the ears, or metallic taste in the mouth". No attempts were made at seeking any paresthesias. Safe injection practices and needle disposal techniques used. Medications properly checked for expiration dates. SDV (single dose vial) medications used. After the completion of the procedure, all disposable equipment used was discarded in the proper designated medical waste containers. Local Anesthesia: Protocol guidelines were followed. The patient was positioned over the fluoroscopy table. The area was prepped in the usual manner. The  time-out was completed. The target area was identified using fluoroscopy. A 12-in long, straight, sterile hemostat was used with fluoroscopic guidance to locate the targets for each level blocked. Once located, the skin was marked with an approved surgical skin marker. Once all sites were marked, the skin (epidermis, dermis, and hypodermis), as well as deeper tissues (fat, connective tissue and muscle) were infiltrated with a small amount of a short-acting local anesthetic, loaded on a 10cc syringe with a 25G, 1.5-in  Needle. An appropriate amount of time was allowed for local anesthetics to take effect before proceeding to the next step. Local Anesthetic: Lidocaine 2.0% The unused portion of the local anesthetic was discarded in the proper designated containers. Technical explanation of process:  L2 Medial Branch Nerve Block (MBB): The target area for the L2 medial branch is at the junction of the postero-lateral aspect of the superior articular process and the superior, posterior, and medial edge of the transverse process of L3. Under fluoroscopic guidance, a Quincke needle was inserted until contact was made with os over the superior postero-lateral aspect of the pedicular shadow (target area). After negative aspiration for blood, 0.5 mL of the nerve block solution was injected without difficulty or complication. The needle was removed intact. L3 Medial Branch Nerve Block (MBB): The target area for the L3 medial branch is at the junction of the postero-lateral aspect of the superior articular process and the superior, posterior, and medial edge of the transverse process of L4. Under fluoroscopic guidance, a Quincke needle was inserted until contact was made with os over the superior postero-lateral aspect of the pedicular shadow (target area). After negative aspiration for blood, 0.5 mL of the nerve block solution was injected without difficulty or complication. The needle was removed intact. L4 Medial  Branch Nerve Block (MBB): The target area for the L4 medial branch is at the junction of the postero-lateral aspect of the superior articular process and the superior, posterior, and medial edge of the transverse process of L5.  Under fluoroscopic guidance, a Quincke needle was inserted until contact was made with os over the superior postero-lateral aspect of the pedicular shadow (target area). After negative aspiration for blood, 0.5 mL of the nerve block solution was injected without difficulty or complication. The needle was removed intact. L5 Medial Branch Nerve Block (MBB): The target area for the L5 medial branch is at the junction of the postero-lateral aspect of the superior articular process and the superior, posterior, and medial edge of the sacral ala. Under fluoroscopic guidance, a Quincke needle was inserted until contact was made with os over the superior postero-lateral aspect of the pedicular shadow (target area). After negative aspiration for blood, 0.5 mL of the nerve block solution was injected without difficulty or complication. The needle was removed intact. S1 Medial Branch Nerve Block (MBB): The target area for the S1 medial branch is at the posterior and inferior 6 o'clock position of the L5-S1 facet joint. Under fluoroscopic guidance, the Quincke needle inserted for the L5 MBB was redirected until contact was made with os over the inferior and postero aspect of the sacrum, at the 6 o' clock position under the L5-S1 facet joint (Target area). After negative aspiration for blood, 0.5 mL of the nerve block solution was injected without difficulty or complication. The needle was removed intact.  Nerve block solution: 0.2% PF-Ropivacaine + Triamcinolone (40 mg/mL) diluted to a final concentration of 4 mg of Triamcinolone/mL of Ropivacaine The unused portion of the solution was discarded in the proper designated containers. Procedural Needles: 22-gauge, 3.5-inch, Quincke needles used for all  levels.  Once the entire procedure was completed, the treated area was cleaned, making sure to leave some of the prepping solution back to take advantage of its long term bactericidal properties.   Illustration of the posterior view of the lumbar spine and the posterior neural structures. Laminae of L2 through S1 are labeled. DPRL5, dorsal primary ramus of L5; DPRS1, dorsal primary ramus of S1; DPR3, dorsal primary ramus of L3; FJ, facet (zygapophyseal) joint L3-L4; I, inferior articular process of L4; LB1, lateral branch of dorsal primary ramus of L1; IAB, inferior articular branches from L3 medial branch (supplies L4-L5 facet joint); IBP, intermediate branch plexus; MB3, medial branch of dorsal primary ramus of L3; NR3, third lumbar nerve root; S, superior articular process of L5; SAB, superior articular branches from L4 (supplies L4-5 facet joint also); TP3, transverse process of L3.  Vitals:   03/02/20 1034 03/02/20 1044 03/02/20 1054 03/02/20 1104  BP: (!) 89/49 (!) 101/51 (!) 90/51 (!) 95/48  Pulse: 76     Resp: 12 (!) 7 (!) 8 10  Temp:  (!) 97.3 F (36.3 C)  (!) 97.1 F (36.2 C)  TempSrc:  Temporal    SpO2: 97% 94% 97% 98%  Weight:      Height:         Start Time: 1021 hrs. End Time: 1033 hrs.  Imaging Guidance (Spinal):          Type of Imaging Technique: Fluoroscopy Guidance (Spinal) Indication(s): Assistance in needle guidance and placement for procedures requiring needle placement in or near specific anatomical locations not easily accessible without such assistance. Exposure Time: Please see nurses notes. Contrast: None used. Fluoroscopic Guidance: I was personally present during the use of fluoroscopy. "Tunnel Vision Technique" used to obtain the best possible view of the target area. Parallax error corrected before commencing the procedure. "Direction-depth-direction" technique used to introduce the needle under continuous pulsed fluoroscopy. Once target  was reached,  antero-posterior, oblique, and lateral fluoroscopic projection used confirm needle placement in all planes. Images permanently stored in EMR. Interpretation: No contrast injected. I personally interpreted the imaging intraoperatively. Adequate needle placement confirmed in multiple planes. Permanent images saved into the patient's record.  Antibiotic Prophylaxis:   Anti-infectives (From admission, onward)   None     Indication(s): None identified  Post-operative Assessment:  Post-procedure Vital Signs:  Pulse/HCG Rate: 76 (nsr)74 Temp: (!) 97.1 F (36.2 C) Resp: 10 BP: (!) 95/48 SpO2: 98 %  EBL: None  Complications: No immediate post-treatment complications observed by team, or reported by patient.  Note: The patient tolerated the entire procedure well. A repeat set of vitals were taken after the procedure and the patient was kept under observation following institutional policy, for this type of procedure. Post-procedural neurological assessment was performed, showing return to baseline, prior to discharge. The patient was provided with post-procedure discharge instructions, including a section on how to identify potential problems. Should any problems arise concerning this procedure, the patient was given instructions to immediately contact us, at any time, without hesitation. In any case, we plan to contact the patient by telephone for a follow-up status report regarding this interventional procedure.  Comments:  No additional relevant information.  Plan of Care  Orders:  Orders Placed This Encounter  Procedures  . LUMBAR FACET(MEDIAL BRANCH NERVE BLOCK) MBNB    Scheduling Instructions:     Procedure: Lumbar facet block (AKA.: Lumbosacral medial branch nerve block)     Side: Bilateral     Level: L3-4, L4-5, & L5-S1 Facets (L2, L3, L4, L5, & S1 Medial Branch Nerves)     Sedation: Patient's choice.     Timeframe: Today    Order Specific Question:   Where will this procedure be  performed?    Answer:   [facility]  . [facility] C-ARM 1-60 MIN NO REPORT    Intraoperative interpretation by procedural physician at [facility].    Standing Status:   Standing    Number of Occurrences:   1    Order Specific Question:   Reason for exam:    Answer:   Assistance in needle guidance and placement for procedures requiring needle placement in or near specific anatomical locations not easily accessible without such assistance.  . Informed Consent Details: Physician/Practitioner Attestation; Transcribe to consent form and obtain patient signature    Nursing Order: Transcribe to consent form and obtain patient signature. Note: Always confirm laterality of pain with Paula Duncan, before procedure.    Order Specific Question:   Physician/Practitioner attestation of informed consent for procedure/surgical case    Answer:   I, the physician/practitioner, attest that I have discussed with the patient the benefits, risks, side effects, alternatives, likelihood of achieving goals and potential problems during recovery for the procedure that I have provided informed consent.    Order Specific Question:   Procedure    Answer:   Lumbar Facet Block  under fluoroscopic guidance    Order Specific Question:   Physician/Practitioner performing the procedure    Answer:   Khadijatou Borak A. Dossie Arbour MD    Order Specific Question:   Indication/Reason    Answer:   Low Back Pain, with our without leg pain, due to Facet Joint Arthralgia (Joint Pain) Spondylosis (Arthritis of the Spine), without myelopathy or radiculopathy (Nerve Damage).  . Provide equipment / supplies at bedside    "Block Tray" (Disposable  single use) Needle type: SpinalSpinal Amount/quantity:  4 Size: Regular (3-3.5-inch) Gauge: 22G    Standing Status:   Standing    Number of Occurrences:   1    Order Specific Question:   Specify    Answer:   Block Tray   Chronic Opioid Analgesic:  Oxycodone IR 5 mg,  1 tab PO q  8 hrs (15 mg/day of oxycodone) MME/day:15mg /day.   Medications ordered for procedure: Meds ordered this encounter  Medications  . lidocaine (XYLOCAINE) 2 % (with pres) injection 400 mg  . lactated ringers infusion 1,000 mL  . midazolam (VERSED) 5 MG/5ML injection 1-2 mg    Make sure Flumazenil is available in the pyxis when using this medication. If oversedation occurs, administer 0.2 mg IV over 15 sec. If after 45 sec no response, administer 0.2 mg again over 1 min; may repeat at 1 min intervals; not to exceed 4 doses (1 mg)  . fentaNYL (SUBLIMAZE) injection 25-50 mcg    Make sure Narcan is available in the pyxis when using this medication. In the event of respiratory depression (RR< 8/min): Titrate NARCAN (naloxone) in increments of 0.1 to 0.2 mg IV at 2-3 minute intervals, until desired degree of reversal.  . ropivacaine (PF) 2 mg/mL (0.2%) (NAROPIN) injection 18 mL  . triamcinolone acetonide (KENALOG-40) injection 80 mg  . oxyCODONE-acetaminophen (PERCOCET) 5-325 MG tablet    Sig: Take 1 tablet by mouth every 8 (eight) hours as needed for up to 6 days for severe pain. Must last 30 days.    Dispense:  18 tablet    Refill:  0    Chronic Pain: STOP Act (Not applicable) Fill 1 day early if closed on refill date. Avoid benzodiazepines within 8 hours of opioids   Medications administered: We administered lidocaine, lactated ringers, midazolam, fentaNYL, ropivacaine (PF) 2 mg/mL (0.2%), and triamcinolone acetonide.  See the medical record for exact dosing, route, and time of administration.  Follow-up plan:   Return in about 2 weeks (around 03/16/2020) for (VV), (PP) Follow-up.       Interventional treatment options:  Under consideration:   Diagnostic caudal ESI + diagnostic epidurogram  Possible Racz procedure  Possible intrathecal pump trial    Therapeutic/palliative (PRN):   Palliative bilateral lumbar facet block  Diagnostic bilateral SI joint block  Diagnostic right L2-3 LESI  ("wet tap".  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 due to prior surgery) Diagnostic bilateral L2 TFESI #2  Diagnostic right L2 TFESI #2  Diagnostic right L3 TFESI #2  Diagnostic right L4 TFESI #2  Therapeutic/palliative right lumbar facet RFA #2 (last done 09/09/2016)  Therapeutic/palliative right SI RFA #2 (last on 09/09/2016)  Therapeutic/palliative left lumbar facet RFA #2 (last done 10/23/2016)  Therapeutic/palliative left SI RFA #2 (last done 10/23/2016)         Recent Visits Date Type Provider Dept  02/21/20 Office Visit Milinda Pointer, MD [facility]  12/30/19 Telemedicine Milinda Pointer, MD [facility]  Showing recent visits within past 90 days and meeting all other requirements Today's Visits Date Type Provider Dept  03/02/20 Procedure visit Milinda Pointer, MD [facility]  Showing today's visits and meeting all other requirements Future Appointments Date Type Provider Dept  03/16/20 Appointment Milinda Pointer, MD [facility]  05/06/2020 Appointment Milinda Pointer, MD [facility]  Showing future appointments within next 90 days and meeting all other requirements  Disposition: Discharge home  Discharge (Date  Time): 03/02/2020; 1109 hrs.   Primary Care Physician: [name], FNP Location: [facility] Note by: Gaspar Cola, MD Date: 03/02/2020; Time: 11:31 AM  Disclaimer:  Medicine is not an Chief Strategy Officer. The only guarantee in medicine is that nothing is guaranteed. It is important to note that the decision to proceed with this intervention was based on the information collected from the patient. The Data and conclusions were drawn from the patient's questionnaire, the interview, and the physical examination. Because the information was provided in large part by the patient, it cannot be guaranteed that it has not been purposely or unconsciously manipulated. Every effort has been  made to obtain as much relevant data as possible for this evaluation. It is important to note that the conclusions that lead to this procedure are derived in large part from the available data. Always take into account that the treatment will also be dependent on availability of resources and existing treatment guidelines, considered by other Pain Management Practitioners as being common knowledge and practice, at the time of the intervention. For Medico-Legal purposes, it is also important to point out that variation in procedural techniques and pharmacological choices are the acceptable norm. The indications, contraindications, technique, and results of the above procedure should only be interpreted and judged by a Board-Certified Interventional Pain Specialist with extensive familiarity and expertise in the same exact procedure and technique.

## 2020-03-03 ENCOUNTER — Telehealth: Payer: Self-pay | Admitting: *Deleted

## 2020-03-03 NOTE — Telephone Encounter (Signed)
Patient states she continues to have pain, feels like something is pulling on a disc, states she is still numb, states she is using ice and heat.  Encouraged to give the steroid some time to see if that will help improve things.   Patient has another questions about pain medication??  I have sorted that out with patient and explained the rational behind a qty of 18 on her oxycodone - apap and then she has a fill coming up on 03/08/20 qty 90.  Patient verbalizes u/o information.

## 2020-03-15 ENCOUNTER — Encounter: Payer: Self-pay | Admitting: Pain Medicine

## 2020-03-15 ENCOUNTER — Telehealth: Payer: Self-pay
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
## 2020-03-15 NOTE — Telephone Encounter (Signed)
LM for patient to return our call for pre virtual appointment questions.

## 2020-03-16 ENCOUNTER — Other Ambulatory Visit: Payer: Self-pay

## 2020-03-16 ENCOUNTER — Ambulatory Visit: Payer: Medicare Other | Attending: Pain Medicine | Admitting: Pain Medicine

## 2020-03-16 DIAGNOSIS — M47816 Spondylosis without myelopathy or radiculopathy, lumbar region: Secondary | ICD-10-CM

## 2020-03-16 DIAGNOSIS — M47817 Spondylosis without myelopathy or radiculopathy, lumbosacral region: Secondary | ICD-10-CM

## 2020-05-13 ENCOUNTER — Other Ambulatory Visit: Payer: Self-pay | Admitting: Pain Medicine

## 2020-05-13 DIAGNOSIS — M792 Neuralgia and neuritis, unspecified: Secondary | ICD-10-CM

## 2020-05-13 DIAGNOSIS — G8929 Other chronic pain: Secondary | ICD-10-CM

## 2020-05-13 DIAGNOSIS — G4701 Insomnia due to medical condition: Secondary | ICD-10-CM

## 2020-05-25 NOTE — Progress Notes (Signed)
Sadly, today we were notified that [name] passed away on May 24, 2020.  According to the daughter, they went to pick up a check on 05/25/18 and found her dead.  The cause has not been determined.  Our last encounter with this patient was on 03/16/2020 and at that time she was doing well after having had a palliative bilateral lumbar facet block that have provided her with 75% relief of pain.  The last time that we had prescribed anything for her had been 02/21/2020.  She was scheduled to come into the clinic today for her routine opioid analgesic management and possible refill.

## 2020-05-29 ENCOUNTER — Ambulatory Visit: Payer: Medicare Other | Attending: Pain Medicine | Admitting: Pain Medicine

## 2020-05-29 ENCOUNTER — Other Ambulatory Visit: Payer: Self-pay

## 2020-05-29 DIAGNOSIS — M47817 Spondylosis without myelopathy or radiculopathy, lumbosacral region: Secondary | ICD-10-CM

## 2020-05-29 DIAGNOSIS — M5137 Other intervertebral disc degeneration, lumbosacral region: Secondary | ICD-10-CM

## 2020-05-29 DIAGNOSIS — F1120 Opioid dependence, uncomplicated: Secondary | ICD-10-CM

## 2020-05-29 DIAGNOSIS — M47816 Spondylosis without myelopathy or radiculopathy, lumbar region: Secondary | ICD-10-CM

## 2020-05-29 DIAGNOSIS — G894 Chronic pain syndrome: Secondary | ICD-10-CM

## 2020-05-29 DIAGNOSIS — Z79899 Other long term (current) drug therapy: Secondary | ICD-10-CM

## 2020-05-29 DIAGNOSIS — M961 Postlaminectomy syndrome, not elsewhere classified: Secondary | ICD-10-CM

## 2020-05-29 DIAGNOSIS — S32030S Wedge compression fracture of third lumbar vertebra, sequela: Secondary | ICD-10-CM

## 2020-05-29 DIAGNOSIS — G8929 Other chronic pain: Secondary | ICD-10-CM

## 2020-06-06 DIAGNOSIS — 419620001 Death: Secondary | SNOMED CT | POA: Insufficient documentation

## 2020-06-06 DEATH — deceased

## 2020-09-14 ENCOUNTER — Encounter: Payer: Self-pay | Admitting: Acute Care
